# Patient Record
Sex: Male | Born: 1960 | Race: Black or African American | Hispanic: No | Smoking: Former smoker
Health system: Southern US, Community
[De-identification: ages and names within clinical notes are randomized; demographics above are authoritative.]

## PROBLEM LIST (undated history)

## (undated) DIAGNOSIS — I509 Heart failure, unspecified: Secondary | ICD-10-CM

## (undated) DIAGNOSIS — J449 Chronic obstructive pulmonary disease, unspecified: Secondary | ICD-10-CM

## (undated) DIAGNOSIS — R569 Unspecified convulsions: Secondary | ICD-10-CM

## (undated) DIAGNOSIS — I10 Essential (primary) hypertension: Secondary | ICD-10-CM

## (undated) HISTORY — DX: Heart failure, unspecified: I50.9

---

## 2006-06-10 ENCOUNTER — Emergency Department: Payer: Self-pay | Admitting: Emergency Medicine

## 2009-04-22 ENCOUNTER — Emergency Department: Payer: Self-pay | Admitting: Emergency Medicine

## 2009-11-23 ENCOUNTER — Inpatient Hospital Stay: Payer: Self-pay | Admitting: Internal Medicine

## 2010-07-04 ENCOUNTER — Inpatient Hospital Stay: Payer: Self-pay | Admitting: Internal Medicine

## 2010-08-11 ENCOUNTER — Inpatient Hospital Stay: Payer: Self-pay | Admitting: Specialist

## 2010-12-09 ENCOUNTER — Inpatient Hospital Stay: Payer: Self-pay | Admitting: Student

## 2011-04-28 ENCOUNTER — Observation Stay: Payer: Self-pay | Admitting: Internal Medicine

## 2011-08-04 ENCOUNTER — Inpatient Hospital Stay: Payer: Self-pay | Admitting: Internal Medicine

## 2011-08-04 LAB — CK TOTAL AND CKMB (NOT AT ARMC): CK-MB: 0.7 ng/mL (ref 0.5–3.6)

## 2011-08-04 LAB — CBC
HGB: 12.1 g/dL — ABNORMAL LOW (ref 13.0–18.0)
MCH: 32.2 pg (ref 26.0–34.0)
MCHC: 33.9 g/dL (ref 32.0–36.0)
RBC: 3.75 10*6/uL — ABNORMAL LOW (ref 4.40–5.90)
RDW: 12.1 % (ref 11.5–14.5)
WBC: 4 10*3/uL (ref 3.8–10.6)

## 2011-08-04 LAB — COMPREHENSIVE METABOLIC PANEL
Albumin: 3.9 g/dL (ref 3.4–5.0)
Alkaline Phosphatase: 36 U/L — ABNORMAL LOW (ref 50–136)
Anion Gap: 11 (ref 7–16)
BUN: 41 mg/dL — ABNORMAL HIGH (ref 7–18)
Bilirubin,Total: 0.4 mg/dL (ref 0.2–1.0)
Co2: 21 mmol/L (ref 21–32)
Creatinine: 2.58 mg/dL — ABNORMAL HIGH (ref 0.60–1.30)
EGFR (Non-African Amer.): 28 — ABNORMAL LOW
Glucose: 105 mg/dL — ABNORMAL HIGH (ref 65–99)
Osmolality: 279 (ref 275–301)
Potassium: 4.2 mmol/L (ref 3.5–5.1)
SGOT(AST): 97 U/L — ABNORMAL HIGH (ref 15–37)
SGPT (ALT): 45 U/L
Sodium: 134 mmol/L — ABNORMAL LOW (ref 136–145)
Total Protein: 8.4 g/dL — ABNORMAL HIGH (ref 6.4–8.2)

## 2011-08-04 LAB — DRUG SCREEN, URINE
Amphetamines, Ur Screen: NEGATIVE (ref ?–1000)
Barbiturates, Ur Screen: NEGATIVE (ref ?–200)
Benzodiazepine, Ur Scrn: NEGATIVE (ref ?–200)
Cannabinoid 50 Ng, Ur ~~LOC~~: NEGATIVE (ref ?–50)
Cocaine Metabolite,Ur ~~LOC~~: NEGATIVE (ref ?–300)
Methadone, Ur Screen: NEGATIVE (ref ?–300)
Opiate, Ur Screen: NEGATIVE (ref ?–300)
Phencyclidine (PCP) Ur S: NEGATIVE (ref ?–25)
Tricyclic, Ur Screen: NEGATIVE (ref ?–1000)

## 2011-08-04 LAB — PROTIME-INR
INR: 0.9
Prothrombin Time: 12 secs (ref 11.5–14.7)

## 2011-08-04 LAB — TROPONIN I: Troponin-I: 0.27 ng/mL — ABNORMAL HIGH

## 2011-08-04 LAB — PRO B NATRIURETIC PEPTIDE: B-Type Natriuretic Peptide: 305 pg/mL — ABNORMAL HIGH (ref 0–125)

## 2011-08-05 LAB — CBC WITH DIFFERENTIAL/PLATELET
Basophil #: 0 10*3/uL (ref 0.0–0.1)
Basophil %: 0.1 %
HGB: 11.4 g/dL — ABNORMAL LOW (ref 13.0–18.0)
Lymphocyte #: 0.7 10*3/uL — ABNORMAL LOW (ref 1.0–3.6)
Lymphocyte %: 31.3 %
MCH: 32.1 pg (ref 26.0–34.0)
MCHC: 33.7 g/dL (ref 32.0–36.0)
Monocyte #: 0.1 10*3/uL (ref 0.0–0.7)
Monocyte %: 4.6 %
Neutrophil %: 63.8 %
RDW: 11.6 % (ref 11.5–14.5)

## 2011-08-05 LAB — LIPID PANEL: Ldl Cholesterol, Calc: 54 mg/dL (ref 0–100)

## 2011-08-05 LAB — BASIC METABOLIC PANEL
Anion Gap: 11 (ref 7–16)
BUN: 40 mg/dL — ABNORMAL HIGH (ref 7–18)
Calcium, Total: 8.1 mg/dL — ABNORMAL LOW (ref 8.5–10.1)
Co2: 20 mmol/L — ABNORMAL LOW (ref 21–32)
Creatinine: 2.07 mg/dL — ABNORMAL HIGH (ref 0.60–1.30)
EGFR (African American): 44 — ABNORMAL LOW
EGFR (Non-African Amer.): 36 — ABNORMAL LOW
Sodium: 137 mmol/L (ref 136–145)

## 2011-08-05 LAB — TROPONIN I: Troponin-I: 0.26 ng/mL — ABNORMAL HIGH

## 2011-08-06 LAB — TROPONIN I: Troponin-I: 0.19 ng/mL — ABNORMAL HIGH

## 2011-08-06 LAB — BASIC METABOLIC PANEL
BUN: 44 mg/dL — ABNORMAL HIGH (ref 7–18)
Calcium, Total: 8.5 mg/dL (ref 8.5–10.1)
Chloride: 109 mmol/L — ABNORMAL HIGH (ref 98–107)
EGFR (African American): 46 — ABNORMAL LOW
Glucose: 113 mg/dL — ABNORMAL HIGH (ref 65–99)
Osmolality: 288 (ref 275–301)
Potassium: 4.5 mmol/L (ref 3.5–5.1)

## 2011-08-06 LAB — CK: CK, Total: 899 U/L — ABNORMAL HIGH (ref 35–232)

## 2011-08-07 LAB — BASIC METABOLIC PANEL
Anion Gap: 9 (ref 7–16)
Co2: 22 mmol/L (ref 21–32)
Creatinine: 1.53 mg/dL — ABNORMAL HIGH (ref 0.60–1.30)
EGFR (Non-African Amer.): 51 — ABNORMAL LOW
Glucose: 121 mg/dL — ABNORMAL HIGH (ref 65–99)
Sodium: 145 mmol/L (ref 136–145)

## 2012-10-24 DIAGNOSIS — N183 Chronic kidney disease, stage 3 unspecified: Secondary | ICD-10-CM | POA: Insufficient documentation

## 2013-11-01 ENCOUNTER — Inpatient Hospital Stay: Payer: Self-pay | Admitting: Internal Medicine

## 2013-11-01 LAB — CBC WITH DIFFERENTIAL/PLATELET
Basophil #: 0 10*3/uL (ref 0.0–0.1)
Basophil %: 0.9 %
Eosinophil #: 0 10*3/uL (ref 0.0–0.7)
Eosinophil %: 0 %
HCT: 36 % — ABNORMAL LOW (ref 40.0–52.0)
HGB: 11.9 g/dL — ABNORMAL LOW (ref 13.0–18.0)
Lymphocyte #: 0.7 10*3/uL — ABNORMAL LOW (ref 1.0–3.6)
Lymphocyte %: 22 %
MCH: 31.7 pg (ref 26.0–34.0)
MCHC: 33.2 g/dL (ref 32.0–36.0)
MCV: 95 fL (ref 80–100)
MONO ABS: 0.2 x10 3/mm (ref 0.2–1.0)
MONOS PCT: 6.1 %
NEUTROS PCT: 71 %
Neutrophil #: 2.1 10*3/uL (ref 1.4–6.5)
PLATELETS: 65 10*3/uL — AB (ref 150–440)
RBC: 3.78 10*6/uL — AB (ref 4.40–5.90)
RDW: 12.6 % (ref 11.5–14.5)
WBC: 3 10*3/uL — ABNORMAL LOW (ref 3.8–10.6)

## 2013-11-01 LAB — BASIC METABOLIC PANEL
Anion Gap: 10 (ref 7–16)
BUN: 37 mg/dL — ABNORMAL HIGH (ref 7–18)
CALCIUM: 8.3 mg/dL — AB (ref 8.5–10.1)
CO2: 20 mmol/L — AB (ref 21–32)
Chloride: 99 mmol/L (ref 98–107)
Creatinine: 2.59 mg/dL — ABNORMAL HIGH (ref 0.60–1.30)
EGFR (African American): 32 — ABNORMAL LOW
EGFR (Non-African Amer.): 27 — ABNORMAL LOW
GLUCOSE: 110 mg/dL — AB (ref 65–99)
OSMOLALITY: 268 (ref 275–301)
Potassium: 3.7 mmol/L (ref 3.5–5.1)
Sodium: 129 mmol/L — ABNORMAL LOW (ref 136–145)

## 2013-11-01 LAB — TROPONIN I: Troponin-I: 0.29 ng/mL — ABNORMAL HIGH

## 2013-11-02 LAB — URINALYSIS, COMPLETE
Bilirubin,UR: NEGATIVE
Glucose,UR: NEGATIVE mg/dL (ref 0–75)
Ketone: NEGATIVE
LEUKOCYTE ESTERASE: NEGATIVE
Nitrite: NEGATIVE
Ph: 5 (ref 4.5–8.0)
Protein: 75
RBC,UR: 3 /HPF (ref 0–5)
SPECIFIC GRAVITY: 1.005 (ref 1.003–1.030)
Squamous Epithelial: 1

## 2013-11-02 LAB — CBC WITH DIFFERENTIAL/PLATELET
Basophil #: 0 10*3/uL (ref 0.0–0.1)
Basophil %: 0.8 %
Eosinophil #: 0 10*3/uL (ref 0.0–0.7)
Eosinophil %: 0.1 %
HCT: 32.4 % — AB (ref 40.0–52.0)
HGB: 10.8 g/dL — AB (ref 13.0–18.0)
LYMPHS ABS: 0.6 10*3/uL — AB (ref 1.0–3.6)
LYMPHS PCT: 23.1 %
MCH: 31.6 pg (ref 26.0–34.0)
MCHC: 33.4 g/dL (ref 32.0–36.0)
MCV: 95 fL (ref 80–100)
MONOS PCT: 4.9 %
Monocyte #: 0.1 x10 3/mm — ABNORMAL LOW (ref 0.2–1.0)
NEUTROS ABS: 1.9 10*3/uL (ref 1.4–6.5)
Neutrophil %: 71.1 %
PLATELETS: 62 10*3/uL — AB (ref 150–440)
RBC: 3.42 10*6/uL — ABNORMAL LOW (ref 4.40–5.90)
RDW: 12.9 % (ref 11.5–14.5)
WBC: 2.7 10*3/uL — ABNORMAL LOW (ref 3.8–10.6)

## 2013-11-02 LAB — BASIC METABOLIC PANEL
Anion Gap: 12 (ref 7–16)
BUN: 39 mg/dL — ABNORMAL HIGH (ref 7–18)
CHLORIDE: 100 mmol/L (ref 98–107)
Calcium, Total: 7.7 mg/dL — ABNORMAL LOW (ref 8.5–10.1)
Co2: 18 mmol/L — ABNORMAL LOW (ref 21–32)
Creatinine: 2.64 mg/dL — ABNORMAL HIGH (ref 0.60–1.30)
GFR CALC AF AMER: 31 — AB
GFR CALC NON AF AMER: 27 — AB
GLUCOSE: 102 mg/dL — AB (ref 65–99)
Osmolality: 270 (ref 275–301)
Potassium: 3.9 mmol/L (ref 3.5–5.1)
SODIUM: 130 mmol/L — AB (ref 136–145)

## 2013-11-02 LAB — MAGNESIUM: Magnesium: 0.9 mg/dL — ABNORMAL LOW

## 2013-11-02 LAB — TROPONIN I
TROPONIN-I: 0.24 ng/mL — AB
Troponin-I: 0.26 ng/mL — ABNORMAL HIGH

## 2014-08-31 NOTE — H&P (Signed)
PATIENT NAME:  Darin Hopkins, Darin Hopkins MR#:  [id number] DATE OF BIRTH:  11-10-1960  DATE OF ADMISSION:  11/01/2013  REFERRING PHYSICIAN:  Dr. Minna Antis  PRIMARY CARE PHYSICIAN: [facility].   CHIEF COMPLAINT: Generalized weakness.   HISTORY OF PRESENT ILLNESS: This is a 54 year old male with a known history of CVA in the past, hypertension, hyperlipidemia and seizures, and history of alcohol abuse in the past, presents with complaints of generalized weakness, cough, chills and shortness of breath. The patient was hypoxic at 90% on room air. The patient denies any focal deficits and altered mental status or loss of consciousness, but her symptoms have been going on almost for a week right now. Patient's chest x-ray did not show any acute findings, but he did   report cough with productive sputum. He is known to have history of COPD, had significant wheezing upon presentation as well, which improved after receiving IV Solu-Medrol. The patient had multiple lab abnormalities, including acute renal failure and hyponatremia. Reports his p.o. intake has been decreased recently as he does not have much of an appetite.  He denies any focal deficits. No tingling. No numbness.  His CT head did not show any acute findings or show any evidence of  his old CVA. The patient was noticed to have thrombocytopenia at 65,000. The patient is known to have history of pancytopenia in the past which was due to bone marrow suppression from alcohol abuse, but baseline platelet was around 100,000, but at the same time, he has not been here for the last 2 years so we do not have any recent labs on him or recent platelet level.   Hospitalist service requested to admit the patient to hydrate and treat his COPD.   PAST MEDICAL HISTORY:  1. History of alcohol abuse.  2. History of tobacco abuse.  3. Coronary artery disease.  4. Anemia of chronic disease.  5. COPD. 6. Hyperlipidemia.  7. Hypertension.  8. History of CVA.   9. History of seizures.  10. 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 History of rhabdomyolysis and acute renal failure in the past.   PAST SURGICAL HISTORY: None.   ALLERGIES: No known drug allergies.   SOCIAL HISTORY: Lives at home. Smokes a pack per day. He is on disability. The patient reports he got back on his alcoholic drink once or twice a week.   FAMILY HISTORY: Significant for coronary artery disease.   HOME MEDICATIONS:  1. Norvasc 5 mg daily.  2. Aspirin 81 mg daily.  3. Atorvastatin 20 mg at bedtime.  4. Folic acid 1 mg daily.  5. Keppra 500 mg b.i.d.  6. Combivent as needed.  7. Symbicort 160/4.5 two puffs b.i.d.   REVIEW OF SYSTEMS:  CONSTITUTIONAL: Patient reports chills, fatigue, weakness. Denies weight gain, weight loss. Reports poor appetite.  EYES: Denies blurry vision, double vision, inflammation.  EARS, NOSE, AND THROAT: Denies  , hearing loss, epistaxis.  RESPIRATORY: Reports cough, productive sputum, wheezing, shortness of breath, and COPD.  CARDIOVASCULAR: Denies chest pain, edema, arrhythmia, palpitations.  GASTROINTESTINAL: Denies nausea, vomiting, diarrhea, abdominal pain.  GENITOURINARY: Denies dysuria, hematuria, or renal colic.  ENDOCRINE: Denies polyuria, polydipsia, heat or cold intolerance.  HEMATOLOGY: Denies anemia, easy bruising, bleeding diathesis.  INTEGUMENT: Denies acne, rash or skin lesion.   MUSCULOSKELETAL: Denies any swelling, gout, arthritis, cramps.  NEUROLOGIC: Reports history of CVA with residual left lower extremity weakness. Denies any focal deficits, weakness or numbness or dysarthria.  PSYCHIATRIC: Denies anxiety,  insomnia, or depression. Reports he got back on his alcohol abuse. He drinks 1-2 times a week.   PHYSICAL EXAMINATION:  VITAL SIGNS: Temperature 98.3, pulse 98, respiratory rate 19, blood pressure 106/76, saturating 94% on room air.  GENERAL: Frail, elderly frail male, appears much  older than his stated age. He is in bed, in no apparent distress.  HEENT: Head atraumatic, normocephalic. Pupils equal, reactive to light. Pink conjunctivae. Anicteric sclerae. Moist oral mucosa.  NECK: Supple. No thyromegaly. No JVD.  CHEST: Good air entry bilaterally. Has diffuse scattered wheezing bilaterally. No rales or rhonchi.  CARDIOVASCULAR: S1, S2 heard. No rubs, murmurs or gallops.  ABDOMEN: Soft, nontender, nondistended. Bowel sounds present.  EXTREMITIES: No edema. No clubbing. No cyanosis. Pedal and radial pulses felt bilaterally.  PSYCHIATRIC: Appropriate affect. Awake, alert x 3. Intact judgment and insight.  NEUROLOGIC: Cranial nerves grossly intact. Motor 5/5. No focal deficits.  MUSCULOSKELETAL: No joint effusion or erythema.  SKIN: Warm and dry. Delayed skin turgor.   PERTINENT LABORATORY DATA: Glucose 110, BUN 37, creatinine 2.59, sodium 129, potassium 3.7, chloride 99, CO2 of 20, troponin 0.29. White blood cells 3, hemoglobin 11.9, hematocrit 36, platelets 65,000.   IMAGING STUDIES: CT head without contrast, chronic ischemic changes. No acute abnormalities. Chest x-ray showing no active disease. EKG showing sinus rhythm at 97 beats per minute without significant ST changes once compared to most recent EKG in 2013.   ASSESSMENT AND PLAN:  1. Generalized weakness. This appears to be multifactorial, most likely due to chronic obstructive pulmonary exacerbation and dehydration and hyponatremia and acute renal failure. As well, we will check urinalysis on the patient.  2. Chronic obstructive pulmonary exacerbation. The patient will be started on IV Solu-Medrol, DuoNebs, and p.r.n. albuterol. We will continue him on Symbicort. We will keep him on p.r.n. oxygen as well. Given the fact he is having productive sputum, we will start him on IV levofloxacin as well.  3. Acute renal failure. Cause is most likely due to volume depletion and dehydration. He has no recent blood work so  unclear what is his baseline. We will continue with intravenous fluids.  4. Hyponatremia. This is due to volume depletion. We will continue with normal saline.  5. Thrombocytopenia. Unclear what is his most recent baseline but he is known to have history of pancytopenia, secondary to his bone marrow depression from alcohol abuse. We will avoid chemical anticoagulation. We will check right upper quadrant ultrasound to evaluate for splenomegaly participating in worsening of his thrombocytopenia.  6. History of cerebrovascular accident. The patient has no acute deficits. We will continue with aspirin.  7. Elevated troponin. Denies any chest pain, has no EKG changes. We will continue to cycle his troponins. This is most likely related to his chronic kidney disease. As well, patient has a chronically elevated troponin and this seems to be around his baseline.  8. Tobacco abuse. The patient was counseled will be started on NicoDerm patch.  9. History of seizures. Continue with  Keppra.  10. History of alcohol abuse. The patient reports he cut back significantly on his alcohol, but we will start him on CIWA protocol as well.  11. Deep vein thrombosis prophylaxis. Sequential compression device.   CODE STATUS: Full code.   TOTAL TIME SPENT ON ADMISSION AND PATIENT CARE: 55 minutes.    ____________________________ Starleen Armsawood S. Elgergawy, MD dse:dd D: 11/02/2013 01:12:18 ET T: 11/02/2013 04:14:33 ET JOB#: [id number]  cc: Starleen Armsawood S. Elgergawy, MD, <Dictator> DAWOOD Teena IraniS ELGERGAWY MD ELECTRONICALLY  SIGNED 11/02/2013 20:35

## 2014-08-31 NOTE — Discharge Summary (Signed)
PATIENT NAME:  Darin Hopkins, Darin Hopkins DATE OF BIRTH:  [birth year]  DATE OF ADMISSION:  11/01/2013 DATE OF DISCHARGE:  11/04/2013  DISCHARGE DIAGNOSES: 1.  Chronic obstructive pulmonary disease exacerbation.  2.  Hypertension. 3.  EtOH abuse.  4.  Tobacco abuse.  5.  Chronic thrombocytopenia.  DISCHARGE MEDICATIONS:   1.  Amlodipine 5 mg p.o. daily.  2.  Aspirin 81 mg p.o. daily. 3.  Combivent 2 puffs as needed for wheezing every 4 hours.  4.  Folic acid 1 mg p.o. daily.  5.  Atorvastatin 20 mg p.o. daily.  6.  Keppra 500 mg p.o. b.i.d. 7.  Symbicort 150/4.5 two puffs b.i.d.  8.  Prednisone 20 mg 3 tablets daily for 2 days, 2 tablets daily for 2 days, then 1 tablet daily for 2 days.  9.  Levaquin 500 mg p.o. daily. The patient was given Levaquin for 5 days.   CONSULTATIONS: None.   HOSPITAL COURSE: The patient is a 54 year old  male patient with a history of CVA in the pons, hypertension, seizures, and alcohol abuse.  Comes in because of cough, chills, and trouble breathing. The patient was found to have COPD exacerbation, admitted to the hospitalist service and start on nebulizers, steroids, and antibiotics. The patient's symptoms nicely improved and the patient was discharged home with Levaquin and Keppra and course of prednisone.   Generalized weakness secondary to COPD exacerbation, dehydration, and hypernatremia. The patient is on IV fluids and seen with physical therapy, and they recommended no health needs at home. Discharged home with sister.   Acute renal failure secondary to volume depletion. The patient's creatinine 2.5 and BUN 37 on admission. The patient thought to be dehydrated, and he did receive IV fluids and initial  creatinine was 2.5 and BUN 37. After the fluids resuscitation, the BUN and creatinine decreased. BUN was 39 and creatinine 2.6 repeated.   Hypomagnesemia secondary to EtOH abuse, which is being replaced. The patient had a history of his alcohol  abuse and ultrasound showed no focal lesions, and the patient's ultrasound of abdomen showed no splenomegaly. The patient does have anemia and thrombocytopenia. Platelets are low at 62,000 and hemoglobin was 10.8. The patient thought to have troponin elevation likely secondary to renal failure, rather than acute coronary event because he did not have any chest pain. The patient was consulted on smoking and drinking. He said he will stop it.   History of seizures and history of CVA. He is on Keppra. We will continue that.   TIME SPENT ON DISCHARGE OF THE PATIENT:  More than 30 minutes.   ____________________________ Katha HammingSnehalatha Candence Sease, MD sk:ts D: 11/08/2013 10:20:01 ET T: 11/08/2013 14:22:54 ET JOB#: [id number]  cc: Katha HammingSnehalatha Jayliana Valencia, MD, <Dictator> Katha HammingSNEHALATHA Deandrew Hoecker MD ELECTRONICALLY SIGNED 11/22/2013 18:42

## 2014-09-01 NOTE — H&P (Signed)
PATIENT NAME:  Darin Hopkins, Darin Hopkins MR#:  [id number] DATE OF BIRTH:  1960-09-16  DATE OF ADMISSION:  08/04/2011  PRIMARY CARE PHYSICIAN: Dr. Em[facility]ylvia [facility] at [facility]    CHIEF COMPLAINT: Having a cold and shortness of breath.   HISTORY OF PRESENT ILLNESS: 54 year old man who presents to the Emergency Room with three days of having a cold. He has been having shortness of breath, coughing up yellow phlegm. He complains of a fever, chills and sweats and fatigue. No complaints of chest pain. Some slight wheeze. The patient has a history of chronic obstructive pulmonary disease. In the Emergency Room he was given 125 mg of Solu-Medrol. He did have a chest x-ray that showed no pneumonia and hyperinflation. He was found to be in acute renal failure with a creatinine of 2.58. Troponin was borderline at 0.27. He was found to be hypoxic with a pO2 on ABG of 52. Hospitalist services were contacted for further evaluation.   PAST MEDICAL HISTORY:  1. Alcohol dependence.  2. Tobacco dependence.  3. Coronary artery disease. 4. Anemia of chronic disease. 5. Chronic obstructive pulmonary disease. 6. Hyperlipidemia. 7. Hypertension. 8. History of stroke. 9. Seizure.   PAST SURGICAL HISTORY: None.   ALLERGIES: No known drug allergies.   SOCIAL HISTORY: Lives with his sister. He smokes 1/2 pack per day. He states that he drinks less than a pint a day that he admitted to the last time in the hospital. Not working at this time.   FAMILY HISTORY: Mother died at 3481 of natural causes. Father died at 1161 of a myocardial infarction.   MEDICATIONS: As per pharmacy tech include:  1. Amlodipine 5 mg daily.  2. Aspirin 81 mg daily.  3. Atorvastatin 20 mg at bedtime.  4. Combivent 2 puffs as needed for shortness of breath.   5. Folic acid 1 mg daily.  6. Hydralazine 75 mg 4 times a day.  7. Hydrochlorothiazide lisinopril 25/20, 1 tablet daily.  8. Omeprazole 20 mg daily.  9. Patient also states that he  takes Keppra 500 mg twice a day.   REVIEW OF SYSTEMS: CONSTITUTIONAL: Positive for fever. Positive for chills. Positive for sweats. Positive for weight loss. Positive for fatigue. EYES: He does not wear glasses. EARS, NOSE, MOUTH, AND THROAT: Positive for runny nose. No sore throat. No difficulty swallowing. CARDIOVASCULAR: No chest pain. No palpitations. RESPIRATORY: Positive for shortness breath. Positive for cough, yellow phlegm. Positive for wheeze. No hemoptysis. GASTROINTESTINAL: Positive for diarrhea. No nausea. No vomiting. No abdominal pain. No bright red blood per rectum. No melena. GENITOURINARY: No burning on urination. No hematuria. MUSCULOSKELETAL: No joint pain or muscle pain. INTEGUMENT: No rashes or eruptions. NEUROLOGIC: No fainting or blackouts. PSYCHIATRIC: No anxiety or depression. ENDOCRINE: No thyroid problems. HEMATOLOGIC/LYMPHATIC: History of anemia.   PHYSICAL EXAMINATION:  VITAL SIGNS: Vital signs on presentation: Temperature 99.7, pulse 99, respirations 18, blood pressure 119/74, pulse oximetry 86% on room air.   GENERAL: No respiratory distress at this point. Patient lying flat in bed.   EYES: Conjunctivae bloodshot. Lids normal. Pupils equal, round, and reactive to light. Extraocular muscles intact. No nystagmus.   EARS, NOSE, MOUTH, AND THROAT: Tympanic membranes no erythema. Nasal mucosa no erythema. Throat no erythema. No exudate seen. Lips and gums no lesions.   NECK: No JVD. No bruits. No lymphadenopathy. No thyromegaly. No thyroid nodules palpated.   RESPIRATORY: Decreased breath sounds bilaterally. Positive wheeze throughout entire lung field.   CARDIOVASCULAR: S1, S2 normal. No gallops,  rubs, or murmurs heard. Carotid upstroke 2+ bilaterally. No bruits. Dorsalis pedis pulses 1+ bilaterally. No edema of the lower extremity.   ABDOMEN: Soft, nontender. No organomegaly/splenomegaly. Normoactive bowel sounds. No masses felt.   LYMPHATIC: No lymph nodes in the  neck.   MUSCULOSKELETAL: Positive for clubbing. No cyanosis on oxygen. No edema of the lower extremities.   SKIN: No ulcers seen.   NEUROLOGIC: Cranial nerves II through XII grossly intact. Deep tendon reflexes 2+ bilateral lower extremities.   PSYCHIATRIC: Patient is oriented to person, place, and time.   LABORATORY, DIAGNOSTIC, AND RADIOLOGICAL DATA: EKG is a normal sinus rhythm, 92 beats per minute, left atrial enlargement, septal infarct age undetermined. Chest x-ray shows hyperinflation, no infiltrate. BNP 305. CPK 1087, CK-MB 0.7. Glucose 105, BUN 41, creatinine 2.58, sodium 134, potassium 4.2, chloride 102, CO2 21, calcium 8.5. Liver function tests: Alkaline phosphatase 36, ALT 45, AST 97, total protein 8.4. White blood cell count 4.0, hemoglobin and hematocrit 12.1 and 35.6, platelet count 111. Troponin borderline at 0.27. INR 0.9. ABG shows pH 7.32, pCO2 41, pO2 52, that is on room air, bicarbonate 21.1, oxygen saturation 90.7.   ASSESSMENT AND PLAN:  1. Respiratory failure. pO2 in the 50s. Will give oxygen supplementation.  2. Chronic obstructive pulmonary disease exacerbation. Will give IV Solu-Medrol, 125 mg IV given already. Will give 60 mg every eight hours. Will also give DuoNeb nebulizer solution and Levaquin IV.  3. Elevated troponin with a history of coronary artery disease. Will continue aspirin. Will obtain an echocardiogram. I think this is most likely secondary to respiratory failure. No beta blocker with his history of wheezing and chronic obstructive pulmonary disease.  4. Acute renal failure on chronic kidney disease. Will hold lisinopril/HCT at this point. Give IV fluid hydration. Get a renal sonogram. Will check creatinine on a daily basis.  5. Rhabdomyolysis. Will stop Lipitor. Give IV fluid hydration. Check a urine toxicology.  6. Tobacco abuse. Smoking cessation counseling done, three minutes by me. Nicotine patch applied.  7. Alcohol abuse. Will put on oral CIWA  protocol. No signs of tremor at this time.  8. History of seizure. Continue Keppra.  9. Gastroesophageal reflux disease. Continue omeprazole.  10. Hypertension. Continue hydralazine and Norvasc at this point. Hold lisinopril/HCT secondary to acute renal failure.  11. Increased liver function tests most likely secondary to alcohol. While getting the renal sonogram will also sonogram the liver.  12. Thrombocytopenia. Looking back at old labs the patient's platelet count has fluctuated in the past. Will continue to monitor. Most likely this is secondary to alcohol also.  TIME SPENT ON ADMISSION: 50 minutes.   ____________________________ Herschell Dimes. Renae Gloss, MD rjw:cms D: 08/04/2011 16:18:58 ET T: 08/04/2011 16:41:35 ET JOB#: [id number]  cc: Herschell Dimes. Renae Gloss, MD, <Dictator> [facility], Dr. Emilio Math Salley Scarlet MD ELECTRONICALLY SIGNED 08/04/2011 21:01

## 2014-09-01 NOTE — Discharge Summary (Signed)
PATIENT NAME:  Darin Hopkins, Darin Hopkins MR#:  [id number] DATE OF BIRTH:  April 07, 1961  DATE OF ADMISSION:  08/04/2011 DATE OF DISCHARGE:  08/07/2011  ADMITTING PHYSICIAN: Alford Highlandichard Wieting, MD    DISCHARGING PHYSICIAN: Enid Baasadhika Aariya Ferrick, MD    PR[facility] C[facility] PHYSICIAN: [facility].   CONSULTATIONS IN THE HOSPITAL: None.   DISCHARGE DIAGNOSES:  1. Acute respiratory failure secondary to chronic obstructive pulmonary disease exacerbation.  2. Acute renal failure.  3. Rhabdomyolysis.  4. Tobacco abuse disorder.  5. Seizure disorder.  6. Alcohol abuse.  7. 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 8. Hypertension.  9. Coronary artery disease.  10. History of cerebrovascular accident.    DISCHARGE MEDICATIONS:  1. Amlodipine 5 mg p.o. daily.  2. Aspirin 81 mg p.o. daily.  3. Combivent inhaler 2 puffs every six hours p.r.n.  4. Folic acid 1 mg p.o. daily.  5. Prilosec 20 mg p.o. daily.  6. Atorvastatin 20 mg daily.  7. Symbicort 160/4.5, 2 puffs b.i.d.  8. Keppra 500 mg p.o. b.i.d.  9. Prednisone taper.  10. Levaquin 500 mg p.o. daily until 08/11/2011.   DISCHARGE DIET: Low-sodium diet.   DISCHARGE ACTIVITY: As tolerated.    FOLLOWUP INSTRUCTIONS: Primary care physician follow-up in 1 to 2 weeks.   HOME OXYGEN: None.   LABORATORY, DIAGNOSTIC AND RADIOLOGICAL DATA:  Sodium 145, potassium 4.8, chloride 114, bicarbonate 22, BUN 34, creatinine 1.53, glucose 121, calcium 8.2.  CK 575 at the time of discharge.  WBC 2.4, hemoglobin 11.4, hematocrit 32.9, platelet count 106.  Echo Doppler showing normal size left ventricle, no thrombus, ejection fraction greater than 55%.  Ultrasound of the abdomen showing fatty infiltration of liver. No focal hepatic mass and no gallstones seen. The gallbladder wall is slightly thickened. No ascites is found.   LDL cholesterol 54, HDL 44, total cholesterol 007118, triglycerides 99, TSH 0.39.  Troponin elevated at 0.29 on admission.  BNP  was 305. ALT 45, AST 97, alkaline phosphatase 36, total bilirubin 0.4, albumin 3.9.  Chest x-ray showing clear lung fields, hyperinflated lungs suggesting chronic obstructive pulmonary disease or asthma.  Creatinine on admission was 2.58. CK on admission was 1087.   BRIEF HOSPITAL COURSE: [name] is a 54 year old African American male with past medical history significant for alcohol use, smoking, hypertension, chronic obstructive pulmonary disease, presented with cold and worsening dyspnea. He was found to be in acute renal failure with slightly elevated troponin and also hypoxia.   1. Acute hypoxic respiratory failure with pO2 on arterial blood gas of 52. He is being admitted, placed on oxygen support via nasal cannula. Steroids, inhalers, and also DuoNebs were started. He is also on Levaquin for bronchitis and cough symptoms. Clinically, his condition has improved. He is currently weaned off and on room air, saturating well, ambulating without any dyspnea. I will finish off the prednisone taper and also Levaquin course. He can continue taking his Symbicort and Combivent inhalers for now.  2. Acute renal failure with rhabdomyolysis: Probably from alcohol use, fall?, though the patient denies it.  With IV fluids, the numbers have improved, and  renal function is much improving, close to baseline currently. Also, CPK has come down to 500s at the time of discharge. He will need PCP follow-up in the next 1 to 2 weeks.  3. Hypertension: Continue Norvasc.  4. Seizure disorder: Has been on Keppra. Continue Keppra. I am not sure if this is a primary seizure disorder or alcohol withdrawal seizure in the past.  5.  Alcohol use and tobacco use disorder: He has been strongly counseled and said that he would stay away from both of those. He has been on CIWA  protocol while in the hospital and has not required much Ativan. His course has been otherwise uneventful in the hospital.   DISCHARGE CONDITION: Stable.    DISCHARGE DISPOSITION: Home.   TIME SPENT ON DISCHARGE: 40 minutes.   ____________________________ Enid Baas, MD rk:cbb D: 08/07/2011 12:50:19 ET T: 08/09/2011 11:15:14 ET JOB#: [id number]  cc: Enid Baas, MD, <Dictator> Enid Baas MD ELECTRONICALLY SIGNED 08/13/2011 13:47

## 2017-09-14 ENCOUNTER — Encounter: Payer: Self-pay | Admitting: *Deleted

## 2020-08-17 ENCOUNTER — Encounter: Payer: Self-pay | Admitting: Emergency Medicine

## 2020-08-17 ENCOUNTER — Inpatient Hospital Stay
Admission: EM | Admit: 2020-08-17 | Discharge: 2020-08-25 | DRG: 291 | Disposition: A | Discharge status: Home Health | Payer: Medicaid Other | Attending: Internal Medicine | Admitting: Internal Medicine

## 2020-08-17 ENCOUNTER — Emergency Department: Payer: Medicaid Other

## 2020-08-17 ENCOUNTER — Other Ambulatory Visit: Payer: Self-pay

## 2020-08-17 DIAGNOSIS — R54 Age-related physical debility: Secondary | ICD-10-CM | POA: Diagnosis present

## 2020-08-17 DIAGNOSIS — I5033 Acute on chronic diastolic (congestive) heart failure: Secondary | ICD-10-CM | POA: Diagnosis present

## 2020-08-17 DIAGNOSIS — I2510 Atherosclerotic heart disease of native coronary artery without angina pectoris: Secondary | ICD-10-CM | POA: Diagnosis present

## 2020-08-17 DIAGNOSIS — E785 Hyperlipidemia, unspecified: Secondary | ICD-10-CM | POA: Diagnosis present

## 2020-08-17 DIAGNOSIS — Q231 Congenital insufficiency of aortic valve: Secondary | ICD-10-CM

## 2020-08-17 DIAGNOSIS — K219 Gastro-esophageal reflux disease without esophagitis: Secondary | ICD-10-CM

## 2020-08-17 DIAGNOSIS — Z87898 Personal history of other specified conditions: Secondary | ICD-10-CM

## 2020-08-17 DIAGNOSIS — N1831 Chronic kidney disease, stage 3a: Secondary | ICD-10-CM | POA: Diagnosis present

## 2020-08-17 DIAGNOSIS — Z8673 Personal history of transient ischemic attack (TIA), and cerebral infarction without residual deficits: Secondary | ICD-10-CM

## 2020-08-17 DIAGNOSIS — F1010 Alcohol abuse, uncomplicated: Secondary | ICD-10-CM

## 2020-08-17 DIAGNOSIS — Z7982 Long term (current) use of aspirin: Secondary | ICD-10-CM

## 2020-08-17 DIAGNOSIS — F17210 Nicotine dependence, cigarettes, uncomplicated: Secondary | ICD-10-CM | POA: Diagnosis present

## 2020-08-17 DIAGNOSIS — J431 Panlobular emphysema: Secondary | ICD-10-CM

## 2020-08-17 DIAGNOSIS — G40909 Epilepsy, unspecified, not intractable, without status epilepticus: Secondary | ICD-10-CM | POA: Diagnosis present

## 2020-08-17 DIAGNOSIS — Z20822 Contact with and (suspected) exposure to covid-19: Secondary | ICD-10-CM | POA: Diagnosis present

## 2020-08-17 DIAGNOSIS — I509 Heart failure, unspecified: Secondary | ICD-10-CM

## 2020-08-17 DIAGNOSIS — E43 Unspecified severe protein-calorie malnutrition: Secondary | ICD-10-CM | POA: Insufficient documentation

## 2020-08-17 DIAGNOSIS — I130 Hypertensive heart and chronic kidney disease with heart failure and stage 1 through stage 4 chronic kidney disease, or unspecified chronic kidney disease: Principal | ICD-10-CM | POA: Diagnosis present

## 2020-08-17 DIAGNOSIS — I77810 Thoracic aortic ectasia: Secondary | ICD-10-CM | POA: Diagnosis present

## 2020-08-17 DIAGNOSIS — Z6821 Body mass index (BMI) 21.0-21.9, adult: Secondary | ICD-10-CM

## 2020-08-17 DIAGNOSIS — J449 Chronic obstructive pulmonary disease, unspecified: Secondary | ICD-10-CM | POA: Diagnosis present

## 2020-08-17 DIAGNOSIS — I248 Other forms of acute ischemic heart disease: Secondary | ICD-10-CM | POA: Diagnosis present

## 2020-08-17 DIAGNOSIS — E782 Mixed hyperlipidemia: Secondary | ICD-10-CM

## 2020-08-17 DIAGNOSIS — I10 Essential (primary) hypertension: Secondary | ICD-10-CM | POA: Diagnosis not present

## 2020-08-17 DIAGNOSIS — I5023 Acute on chronic systolic (congestive) heart failure: Secondary | ICD-10-CM | POA: Diagnosis not present

## 2020-08-17 DIAGNOSIS — J9601 Acute respiratory failure with hypoxia: Secondary | ICD-10-CM | POA: Diagnosis present

## 2020-08-17 DIAGNOSIS — R64 Cachexia: Secondary | ICD-10-CM | POA: Diagnosis present

## 2020-08-17 DIAGNOSIS — J9602 Acute respiratory failure with hypercapnia: Secondary | ICD-10-CM

## 2020-08-17 HISTORY — DX: Essential (primary) hypertension: I10

## 2020-08-17 HISTORY — DX: Chronic obstructive pulmonary disease, unspecified: J44.9

## 2020-08-17 LAB — COMPREHENSIVE METABOLIC PANEL
ALT: 14 U/L (ref 0–44)
AST: 22 U/L (ref 15–41)
Albumin: 3.6 g/dL (ref 3.5–5.0)
Alkaline Phosphatase: 40 U/L (ref 38–126)
Anion gap: 7 (ref 5–15)
BUN: 41 mg/dL — ABNORMAL HIGH (ref 6–20)
CO2: 27 mmol/L (ref 22–32)
Calcium: 8.4 mg/dL — ABNORMAL LOW (ref 8.9–10.3)
Chloride: 112 mmol/L — ABNORMAL HIGH (ref 98–111)
Creatinine, Ser: 1.54 mg/dL — ABNORMAL HIGH (ref 0.61–1.24)
GFR, Estimated: 52 mL/min — ABNORMAL LOW (ref >60)
Glucose, Bld: 103 mg/dL — ABNORMAL HIGH (ref 70–99)
Potassium: 4.8 mmol/L (ref 3.5–5.1)
Sodium: 146 mmol/L — ABNORMAL HIGH (ref 135–145)
Total Bilirubin: 0.6 mg/dL (ref 0.3–1.2)
Total Protein: 6.9 g/dL (ref 6.5–8.1)

## 2020-08-17 LAB — TSH: TSH: 2.303 u[IU]/mL (ref 0.350–4.500)

## 2020-08-17 LAB — RESP PANEL BY RT-PCR (FLU A&B, COVID) ARPGX2
Influenza A by PCR: NEGATIVE
Influenza B by PCR: NEGATIVE
SARS Coronavirus 2 by RT PCR: NEGATIVE

## 2020-08-17 LAB — CBC WITH DIFFERENTIAL/PLATELET
Abs Immature Granulocytes: 0.02 10*3/uL (ref 0.00–0.07)
Basophils Absolute: 0 10*3/uL (ref 0.0–0.1)
Basophils Relative: 0 %
Eosinophils Absolute: 0.1 10*3/uL (ref 0.0–0.5)
Eosinophils Relative: 1 %
HCT: 44.3 % (ref 39.0–52.0)
Hemoglobin: 13.3 g/dL (ref 13.0–17.0)
Immature Granulocytes: 0 %
Lymphocytes Relative: 51 %
Lymphs Abs: 5.1 10*3/uL — ABNORMAL HIGH (ref 0.7–4.0)
MCH: 31.1 pg (ref 26.0–34.0)
MCHC: 30 g/dL (ref 30.0–36.0)
MCV: 103.5 fL — ABNORMAL HIGH (ref 80.0–100.0)
Monocytes Absolute: 0.5 10*3/uL (ref 0.1–1.0)
Monocytes Relative: 5 %
Neutro Abs: 4.2 10*3/uL (ref 1.7–7.7)
Neutrophils Relative %: 43 %
Platelets: 204 10*3/uL (ref 150–400)
RBC: 4.28 MIL/uL (ref 4.22–5.81)
RDW: 13.2 % (ref 11.5–15.5)
WBC: 9.9 10*3/uL (ref 4.0–10.5)
nRBC: 0 % (ref 0.0–0.2)

## 2020-08-17 LAB — HIV ANTIBODY (ROUTINE TESTING W REFLEX): HIV Screen 4th Generation wRfx: NONREACTIVE

## 2020-08-17 LAB — PHOSPHORUS: Phosphorus: 3.6 mg/dL (ref 2.5–4.6)

## 2020-08-17 LAB — TROPONIN I (HIGH SENSITIVITY)
Troponin I (High Sensitivity): 22 ng/L — ABNORMAL HIGH (ref <18)
Troponin I (High Sensitivity): 23 ng/L — ABNORMAL HIGH (ref <18)

## 2020-08-17 LAB — MAGNESIUM: Magnesium: 1.9 mg/dL (ref 1.7–2.4)

## 2020-08-17 LAB — BRAIN NATRIURETIC PEPTIDE: B Natriuretic Peptide: 1306.1 pg/mL — ABNORMAL HIGH (ref 0.0–100.0)

## 2020-08-17 LAB — MRSA PCR SCREENING: MRSA by PCR: NEGATIVE

## 2020-08-17 MED ORDER — IPRATROPIUM-ALBUTEROL 20-100 MCG/ACT IN AERS
1.0000 | INHALATION_SPRAY | Freq: Four times a day (QID) | RESPIRATORY_TRACT | Status: DC
  Administered 2020-08-17 – 2020-08-25 (×29): 1 via RESPIRATORY_TRACT
  Filled 2020-08-17: qty 4

## 2020-08-17 MED ORDER — LORAZEPAM 1 MG PO TABS
1.0000 mg | ORAL_TABLET | ORAL | Status: AC | PRN
Start: 2020-08-17 — End: 2020-08-20

## 2020-08-17 MED ORDER — THIAMINE HCL 100 MG PO TABS
100.0000 mg | ORAL_TABLET | Freq: Every day | ORAL | Status: DC
  Administered 2020-08-17 – 2020-08-25 (×8): 100 mg via ORAL
  Filled 2020-08-17 (×9): qty 1

## 2020-08-17 MED ORDER — SODIUM CHLORIDE 0.9% FLUSH
3.0000 mL | Freq: Two times a day (BID) | INTRAVENOUS | Status: DC
  Administered 2020-08-17 – 2020-08-25 (×17): 3 mL via INTRAVENOUS

## 2020-08-17 MED ORDER — FUROSEMIDE 10 MG/ML IJ SOLN
40.0000 mg | Freq: Once | INTRAMUSCULAR | Status: AC
  Administered 2020-08-17: 40 mg via INTRAVENOUS
  Filled 2020-08-17: qty 4

## 2020-08-17 MED ORDER — NICOTINE 14 MG/24HR TD PT24: 14.0000 mg | MEDICATED_PATCH | Freq: Every day | TRANSDERMAL | Status: AC | PRN

## 2020-08-17 MED ORDER — CHLORHEXIDINE GLUCONATE CLOTH 2 % EX PADS
6.0000 | MEDICATED_PAD | Freq: Every day | CUTANEOUS | Status: DC
  Administered 2020-08-18 – 2020-08-24 (×6): 6 via TOPICAL
  Filled 2020-08-17: qty 6

## 2020-08-17 MED ORDER — ASPIRIN EC 81 MG PO TBEC
81.0000 mg | DELAYED_RELEASE_TABLET | Freq: Every day | ORAL | Status: DC
  Administered 2020-08-17 – 2020-08-25 (×9): 81 mg via ORAL
  Filled 2020-08-17 (×9): qty 1

## 2020-08-17 MED ORDER — LISINOPRIL 20 MG PO TABS
20.0000 mg | ORAL_TABLET | Freq: Every day | ORAL | Status: DC
  Administered 2020-08-17 – 2020-08-19 (×3): 20 mg via ORAL
  Filled 2020-08-17 (×3): qty 1

## 2020-08-17 MED ORDER — FUROSEMIDE 10 MG/ML IJ SOLN: 40.0000 mg | Freq: Every day | INTRAMUSCULAR | Status: DC

## 2020-08-17 MED ORDER — PANTOPRAZOLE SODIUM 40 MG PO TBEC
40.0000 mg | DELAYED_RELEASE_TABLET | Freq: Every day | ORAL | Status: DC
  Administered 2020-08-17 – 2020-08-25 (×9): 40 mg via ORAL
  Filled 2020-08-17 (×9): qty 1

## 2020-08-17 MED ORDER — MOMETASONE FURO-FORMOTEROL FUM 100-5 MCG/ACT IN AERO
2.0000 | INHALATION_SPRAY | Freq: Two times a day (BID) | RESPIRATORY_TRACT | Status: DC
  Administered 2020-08-17 – 2020-08-25 (×15): 2 via RESPIRATORY_TRACT
  Filled 2020-08-17: qty 8.8

## 2020-08-17 MED ORDER — LORAZEPAM 2 MG/ML IJ SOLN: 1.0000 mg | INTRAMUSCULAR | Status: AC | PRN

## 2020-08-17 MED ORDER — AMLODIPINE BESYLATE 5 MG PO TABS
5.0000 mg | ORAL_TABLET | Freq: Every day | ORAL | Status: DC
  Administered 2020-08-17 – 2020-08-20 (×4): 5 mg via ORAL
  Filled 2020-08-17 (×4): qty 1

## 2020-08-17 MED ORDER — ACETAMINOPHEN 325 MG PO TABS: 650.0000 mg | ORAL_TABLET | ORAL | Status: DC | PRN

## 2020-08-17 MED ORDER — ONDANSETRON HCL 4 MG/2ML IJ SOLN: 4.0000 mg | Freq: Four times a day (QID) | INTRAMUSCULAR | Status: DC | PRN

## 2020-08-17 MED ORDER — SODIUM CHLORIDE 0.9% FLUSH: 3.0000 mL | INTRAVENOUS | Status: DC | PRN

## 2020-08-17 MED ORDER — SODIUM CHLORIDE 0.9 % IV SOLN: 250.0000 mL | INTRAVENOUS | Status: DC | PRN

## 2020-08-17 MED ORDER — ENOXAPARIN SODIUM 40 MG/0.4ML ~~LOC~~ SOLN
40.0000 mg | SUBCUTANEOUS | Status: DC
  Administered 2020-08-17 – 2020-08-24 (×8): 40 mg via SUBCUTANEOUS
  Filled 2020-08-17 (×8): qty 0.4

## 2020-08-17 MED ORDER — FOLIC ACID 1 MG PO TABS
1.0000 mg | ORAL_TABLET | Freq: Every day | ORAL | Status: DC
  Administered 2020-08-17 – 2020-08-25 (×9): 1 mg via ORAL
  Filled 2020-08-17 (×9): qty 1

## 2020-08-17 MED ORDER — ADULT MULTIVITAMIN W/MINERALS CH
1.0000 | ORAL_TABLET | Freq: Every day | ORAL | Status: DC
  Administered 2020-08-17 – 2020-08-25 (×9): 1 via ORAL
  Filled 2020-08-17 (×9): qty 1

## 2020-08-17 MED ORDER — THIAMINE HCL 100 MG/ML IJ SOLN
100.0000 mg | Freq: Every day | INTRAMUSCULAR | Status: DC
  Administered 2020-08-20: 100 mg via INTRAVENOUS
  Filled 2020-08-17: qty 2

## 2020-08-17 MED ORDER — LEVETIRACETAM 500 MG PO TABS
500.0000 mg | ORAL_TABLET | Freq: Two times a day (BID) | ORAL | Status: DC
  Administered 2020-08-17 – 2020-08-25 (×17): 500 mg via ORAL
  Filled 2020-08-17 (×18): qty 1

## 2020-08-17 NOTE — Progress Notes (Signed)
Bipap removed at this time. Pt placed on 4L [place]. Tolerating well.

## 2020-08-17 NOTE — ED Provider Notes (Signed)
[facility] Emergency Department Provider Note ____________________________________________   Event Date/Time   First MD Initiated Contact with Patient 08/17/20 1023     (approximate)  I have reviewed the triage vital signs and the nursing notes.   HISTORY  Chief Complaint Shortness of Breath  Level 5 caveat: History of present illness limited due to respiratory distress  HPI Darin Hopkins is a 60 y.o. male with a history of COPD (but no known CHF history) who presents with shortness of breath over the last several days, gradual onset, worsening, and associated with some cough.  The patient denies chest pain.  Per EMS, O2 saturations were as low as the 60s to 70s when they arrived.  He was placed on CPAP, given duo nebs as well as IV Solu-Medrol and improved significantly.   Past Medical History:  Diagnosis Date  . COPD (chronic obstructive pulmonary disease) (HCC)   . Hypertension     Patient Active Problem List   Diagnosis Date Noted  . Acute exacerbation of CHF (congestive heart failure) (HCC) 08/17/2020  . Essential hypertension 08/17/2020  . History of seizure 08/17/2020  . COPD (chronic obstructive pulmonary disease) (HCC) 08/17/2020  . Hyperlipidemia 08/17/2020  . GERD (gastroesophageal reflux disease) 08/17/2020      Prior to Admission medications   Not on File    Allergies Patient has no known allergies.  History reviewed. No pertinent family history.  Social History Social History   Tobacco Use  . Smoking status: Current Every Day Smoker    Packs/day: 2.00    Types: Cigarettes  . Smokeless tobacco: Never Used  Vaping Use  . Vaping Use: Never used  Substance Use Topics  . Alcohol use: Yes    Alcohol/week: 3.0 standard drinks    Types: 3 Cans of beer per week    Comment: daily  . Drug use: Not Currently    Review of Systems Level 5 caveat: Review of systems limited due to respiratory distress  Cardiovascular:  Denies chest pain. Respiratory: Positive for shortness of breath. Gastrointestinal: No vomiting. Genitourinary: Negative for flank pain. Musculoskeletal: Negative for back pain.   ____________________________________________   PHYSICAL EXAM:  VITAL SIGNS: ED Triage Vitals  Enc Vitals Group     BP 08/17/20 1028 123/88     Pulse Rate 08/17/20 1028 63     Resp 08/17/20 1028 15     Temp 08/17/20 1028 (!) 97 F (36.1 C)     Temp Source 08/17/20 1028 Axillary     SpO2 08/17/20 1026 100 %     Weight 08/17/20 1032 132 lb (59.9 kg)     Height 08/17/20 1032 5\' 7"  (1.702 m)     Head Circumference --      Peak Flow --      Pain Score 08/17/20 1031 0     Pain Loc --      Pain Edu? --      Excl. in GC? --     Constitutional: Alert and oriented.  Uncomfortable appearing, somewhat tremulous. Eyes: Conjunctivae are normal.  Head: Atraumatic. Nose: No congestion/rhinnorhea. Mouth/Throat: Mucous membranes are dry.   Neck: Normal range of motion.  Cardiovascular: Normal rate, regular rhythm. Grossly normal heart sounds.  Good peripheral circulation. Respiratory: Increased respiratory effort with accessory muscle use. Lungs with diminished breath sounds bilaterally, some faint wheezes. Gastrointestinal: Soft and nontender. No distention.  Genitourinary: No flank tenderness. Musculoskeletal: 2+ bilateral lower extremity edema.  Extremities warm and well perfused.  Neurologic:  Normal speech and language.  Motor intact in all extremities. Skin:  Skin is warm and dry. No rash noted. Psychiatric: Calm and cooperative.  ____________________________________________   LABS (all labs ordered are listed, but only abnormal results are displayed)  Labs Reviewed  COMPREHENSIVE METABOLIC PANEL - Abnormal; Notable for the following components:      Result Value   Sodium 146 (*)    Chloride 112 (*)    Glucose, Bld 103 (*)    BUN 41 (*)    Creatinine, Ser 1.54 (*)    Calcium 8.4 (*)    GFR,  Estimated 52 (*)    All other components within normal limits  CBC WITH DIFFERENTIAL/PLATELET - Abnormal; Notable for the following components:   MCV 103.5 (*)    Lymphs Abs 5.1 (*)    All other components within normal limits  BRAIN NATRIURETIC PEPTIDE - Abnormal; Notable for the following components:   B Natriuretic Peptide 1,306.1 (*)    All other components within normal limits  TROPONIN I (HIGH SENSITIVITY) - Abnormal; Notable for the following components:   Troponin I (High Sensitivity) 22 (*)    All other components within normal limits  RESP PANEL BY RT-PCR (FLU A&B, COVID) ARPGX2  HIV ANTIBODY (ROUTINE TESTING W REFLEX)  TSH  TROPONIN I (HIGH SENSITIVITY)   ____________________________________________  EKG  ED ECG REPORT I, Dionne Bucy, the attending physician, personally viewed and interpreted this ECG.  Date: 08/17/2020 EKG Time: 1051 Rate: 64 Rhythm: normal sinus rhythm QRS Axis: normal Intervals: normal ST/T Wave abnormalities: Nonspecific ST abnormalities Narrative Interpretation: Nonspecific ST abnormalities with no evidence of acute ischemia (incorrectly read by machine as acute MI due to poor baseline)  ____________________________________________  RADIOLOGY  Chest x-ray interpreted by me shows bilateral interstitial markings in the lower lung  ____________________________________________   PROCEDURES  Procedure(s) performed: No  Procedures  Critical Care performed: Yes  CRITICAL CARE Performed by: Dionne Bucy   Total critical care time: 30 minutes  Critical care time was exclusive of separately billable procedures and treating other patients.  Critical care was necessary to treat or prevent imminent or life-threatening deterioration.  Critical care was time spent personally by me on the following activities: development of treatment plan with patient and/or surrogate as well as nursing, discussions with consultants,  evaluation of patient's response to treatment, examination of patient, obtaining history from patient or surrogate, ordering and performing treatments and interventions, ordering and review of laboratory studies, ordering and review of radiographic studies, pulse oximetry and re-evaluation of patient's condition. ____________________________________________   INITIAL IMPRESSION / ASSESSMENT AND PLAN / ED COURSE  Pertinent labs & imaging results that were available during my care of the patient were reviewed by me and considered in my medical decision making (see chart for details).  60 year old male with a history of COPD presents with shortness of breath over the last several days.  Per EMS he was significantly hypoxic on arrival and was placed on CPAP.  He was given Solu-Medrol and duo nebs with significant improvement.  He has also recently had some leg swelling although has no known history of CHF.  I reviewed the past medical records in Epic; the patient was last admitted here for a COPD exacerbation in 2015, and has no ED visits or admissions since then.  On exam the patient is alert and somewhat uncomfortable appearing and tremulous.  He is speaking in short sentences.  On arrival, he was immediately placed on BiPAP and  his vital signs are currently normal.  Lung sounds are diminished bilaterally with some wheezing.  There is 2+ pitting edema to bilateral lower extremities.  Exam is otherwise unremarkable.  Differential includes COPD exacerbation, new onset CHF, pneumonia, acute bronchitis, COVID-19.  We will continue BiPAP, obtain a chest x-ray lab work-up, and reassess.  ----------------------------------------- 12:47 PM on 08/17/2020 -----------------------------------------  Chest x-ray shows interstitial opacities in the lower lungs.  Lab work-up is significant for elevated BNP.  Overall I suspect CHF exacerbation with possibly some component of COPD.  Covid is negative and there is  no clinical evidence for pneumonia.  The patient remains on BiPAP and appears much more comfortable.  I have ordered IV Lasix.  I consulted Dr. Sedalia Muta from the hospitalist service for admission.  __________________________  Darin Hopkins was evaluated in Emergency Department on 08/17/2020 for the symptoms described in the history of present illness. He was evaluated in the context of the global COVID-19 pandemic, which necessitated consideration that the patient might be at risk for infection with the SARS-CoV-2 virus that causes COVID-19. Institutional protocols and algorithms that pertain to the evaluation of patients at risk for COVID-19 are in a state of rapid change based on information released by regulatory bodies including the CDC and federal and state organizations. These policies and algorithms were followed during the patient's care in the ED.  ____________________________________________   FINAL CLINICAL IMPRESSION(S) / ED DIAGNOSES  Final diagnoses:  Acute respiratory failure with hypoxia (HCC)      NEW MEDICATIONS STARTED DURING THIS VISIT:  New Prescriptions   No medications on file     Note:  This document was prepared using Dragon voice recognition software and may include unintentional dictation errors.    Dionne Bucy, MD 08/17/20 1248

## 2020-08-17 NOTE — Progress Notes (Signed)
Pt placed back on bi-pap per patient request 

## 2020-08-17 NOTE — ED Triage Notes (Addendum)
Pt via ems from home with increases SOB past few day. Hx COPD. Pt with swelling to lower extremities and groin. On arrival of firedepartment pt o2 saturation 70%. Rale breath sound to lower lobes rhonchi to upper lobes. Pt on C pap on arrival. Pt received 2 duonebs, solumedrol, and nitropaste to chest. Pt placed on hospital BiPAP on arrival to ED.

## 2020-08-17 NOTE — Progress Notes (Signed)
Pt arrived on unit at this time. Pt has tremor, he is alert and oriented X4 on bipap. Pt denies pain. Breathing is labored. VSS.

## 2020-08-17 NOTE — H&P (Addendum)
History and Physical   Darin Hopkins OFB:[id number] DOB: 12-26-1960 DOA: 08/17/2020  PCP: Martie Round, NP  Outpatient Specialists: Dr. Glenna Fellows, neprhology Patient coming from: Home via EMS  I have personally briefly reviewed patient's old medical records in [facility] EMR.  Chief Concern: Shortness of breath  HPI: Darin Hopkins is a 60 y.o. male with medical history significant for hypertension, history of GERD, history of heart failure exacerbation, history of seizures, last seizure was approximately 2 years ago, CKD 3, hyperlipidemia, COPD, history of CVA about 2.5 years ago, alcohol abuse, tobacco abuse, presents to the emergency department for chief concerns of shortness of breath.  Patient reports that he has been having shortness of breath the last 3 to 4 days.  He states that shortness of breath is worse with exertion.  He also endorses bilateral lower extremity edema and swelling.  He denies sick contacts.  He states that he also has a new cough that started about 3 to 4 days ago.  He states that the cough is nonproductive.  He denies fever.  He endorses chills and denies nausea and vomiting, chest pain, abdominal pain, dysuria, diarrhea.   He endorses bilateral lower extremity swelling with pitting edema for 3 months.   Patient states he is compliant with medication though he has not seen a doctor in 2 years. He states the medications continue to be prescribe for him.   He states that he did not take any of his medications in the morning.  Per sister, Ms. Darin Glassing, patient drinks a lot of alcohol every single day. When she noticed that he did not drink any alcohol on Saturday, 08/16/20, that was when she knew her brother was not feeling well. She noticed his leg swelling and told him he needs to go to the doctor for further evaluation.   Social history: He lives with his sister.  He endorses daily tobacco use, 2 cigarettes/day.  He drinks about 1-2 alcoholic drinks 3-4 times  a week.  He denies current recreational drug use. He is disabled. His last last drink per sister was Saturday on 08/16/20. Per Sister, he drinks nearly a 1/5th per day. His sister states he smokes 0.5 ppd.   Vaccination: he is vaccinated for covid-19, three doses from [facility]  ROS: Constitutional: no weight change, no fever ENT/Mouth: no sore throat, no rhinorrhea Eyes: no eye pain, no vision changes Cardiovascular: no chest pain, + dyspnea,  + edema, no palpitations Respiratory: + cough, no sputum, no wheezing Gastrointestinal: no nausea, no vomiting, no diarrhea, no constipation Genitourinary: no urinary incontinence, no dysuria, no hematuria Musculoskeletal: no arthralgias, no myalgias Skin: no skin lesions, no pruritus, Neuro: + weakness, no loss of consciousness, no syncope Psych: no anxiety, no depression, + decrease appetite Heme/Lymph: no bruising, no bleeding  ED Course: Discussed with ED provider, patient requiring hospitalization due to heart failure exacerbation.  Vitals in the emergency department was remarkable for temperature of 97, respiration rate of 17, heart rate 110, blood pressure 115/88, patient SPO2 at 98% on BiPAP.  Chest x-ray was ordered and read by radiologist as possibility of atypical pneumonia.  Covid test was negative.  Labs in the emergency department was remarkable for sodium level of 146, potassium of 4.8, chloride 112, bicarb 27, BUN 41, serum creatinine of 1.54, nonfasting blood glucose of 1 03, WBC 9.9, hemoglobin 13.3, platelets 205, GFR estimated was 52.  BNP was elevated at 1306.  Initial high-sensitivity troponin was 22.  ED  provider gave 1 dose of Lasix 40 mg IV and started patient on BiPAP.  Assessment/Plan  Darin Hopkins is a 60 year old male with history of CVA about 2.5 years ago, seizure 2 years ago, hypertension, hyperlipidemia, daily alcohol abuse, daily tobacco abuse, presents to the emergency department for chief concerns of  shortness of breath for 3 to 4 days, this is likely cardiomegaly/new heart failure reduced ejection fraction exacerbation in setting of daily alcohol abuse.  Principal Problem:   Acute exacerbation of CHF (congestive heart failure) (HCC) Active Problems:   Essential hypertension   History of seizure   COPD (chronic obstructive pulmonary disease) (HCC)   Hyperlipidemia   GERD (gastroesophageal reflux disease)   Alcohol abuse   Shortness of breath, suspect secondary to new acute heart failure exacerbation- This is likely reduced ejection fraction from cardiomegaly due to chronic daily heavy alcohol use - Elevated BNP at 1306 -Complete echo ordered -Continue BiPAP -Lasix 40 mg daily will be continued -Strict I's and O's -AM team to consult cardiology if patient has new diagnosis of heart failure -Discussed plan with patient and he agrees, discussed at least year follow-up with pcp  Elevated troponin - suspect secondary to heart failure exacerbation - low clinical suspicion for ACS as patient denies chest pain/chest pressure, we will treat as above  History of seizures-last seizure was about 2 years ago, patient did not take any of his seizure medication this a.m. before EMT arrived -Resumed Keppra 500 mg twice daily including now -Seizure precautions  Hypertension-patient reports compliance with his medication except for day of presentation -Lisinopril-hydrochlorothiazide 20-25 mg daily, hydralazine 50 mg 3 times daily, amlodipine 5 mg daily at home -Currently normotensive at this time, I have resumed lisinopril 20 mg daily and amlodipine 5 mg daily -Added furosemide 40 mg IV daily starting 08/18/20  COPD -Symbicort 160-4.5-4.5 mcg inhaler twice daily -Does not appear patient is in exacerbation -Symbicort is not on formulary, I have replaced with Dulera  Alcohol abuse - drinks 1/5 of liquor per day per sister, CIWA protocal initiated Tobacco abuse - nicotine  patch Hyperlipidemia-atorvastatin 20 mg daily GERD-omeprazole 20 mg daily History of CVA - resumed asa 81 mg daily and atorvastatin 20 mg daily  Health maintenance: patient states he has never had a colonoscopy and he will need to do this on discharge via PCP. He endorses understanding and compliance  Chart reviewed.   DVT prophylaxis: Enoxaparin 40 mg subcutaneous, TED hose Code Status: full code Diet: Heart healthy Family Communication: updated, sister Ms. Darin Hopkins per patient's request Disposition Plan: Pending clinical course and complete echo Consults called: None at this time Admission status: Admit to stepdown, with observation, with telemetry  Past Medical History:  Diagnosis Date  . COPD (chronic obstructive pulmonary disease) (HCC)   . Hypertension    History reviewed. No pertinent surgical history.  Social History:  reports that he has been smoking cigarettes. He has been smoking about 2.00 packs per day. He has never used smokeless tobacco. He reports current alcohol use of about 3.0 standard drinks of alcohol per week. He reports previous drug use.  No Known Allergies History reviewed. No pertinent family history. Family history: Family history reviewed and not pertinent  Physical Exam: Vitals:   08/17/20 1103 08/17/20 1130 08/17/20 1200 08/17/20 1230  BP: 130/81 128/87 115/88 124/83  Pulse: 64 61 66 70  Resp: 15 17 17 17   Temp:      TempSrc:      SpO2: 100% 100%  100% 100%  Weight:      Height:       Constitutional: appears older than chronological age, frail, cachectic, NAD, calm, comfortable Eyes: PERRL, lids and conjunctivae normal ENMT: Mucous membranes are moist. Posterior pharynx clear of any exudate or lesions. Age-appropriate dentition. Hearing appropriate Neck: normal, supple, no masses, no thyromegaly Respiratory: clear to auscultation bilaterally, no wheezing, no crackles. Normal respiratory effort. No accessory muscle use. Bipap mask in  place Cardiovascular: Regular rate and rhythm, no murmurs / rubs / gallops. 3+ bilateral lower extremity pitting edema. 2+ pedal pulses. No carotid bruits.  Abdomen: no tenderness, no masses palpated, no hepatosplenomegaly. Bowel sounds positive.  Musculoskeletal: no clubbing / cyanosis. No joint deformity upper and lower extremities. Good ROM, no contractures, no atrophy. Normal muscle tone.  Skin: no rashes, lesions, ulcers. No induration Neurologic: Sensation intact. Strength 5/5 in all 4.  Psychiatric: Normal judgment and insight. Alert and oriented x 3. Normal mood.   EKG: independently reviewed, showing sinus rhythm, rate of 64, QTc 434  Chest x-ray on Admission: I personally reviewed and I agree with radiologist reading as below.  DG Chest Portable 1 View  Result Date: 08/17/2020 CLINICAL DATA:  Shortness of breath. EXAM: PORTABLE CHEST 1 VIEW COMPARISON:  November 01, 2013 FINDINGS: Cardiomediastinal silhouette is normal. Mediastinal contours appear intact. Coarsening of the interstitial markings in the lower lobes may represent peribronchial airspace consolidation. Osseous structures are without acute abnormality. Soft tissues are grossly normal. IMPRESSION: Coarsening of the interstitial markings in the lower lobes may represent peribronchial airspace consolidation, suggestive of bronchitis or atypical pneumonia. Electronically Signed   By: Ted Mcalpine M.D.   On: 08/17/2020 10:57   Labs on Admission: I have personally reviewed following labs  CBC: Recent Labs  Lab 08/17/20 1037  WBC 9.9  NEUTROABS 4.2  HGB 13.3  HCT 44.3  MCV 103.5*  PLT 204   Basic Metabolic Panel: Recent Labs  Lab 08/17/20 1037  NA 146*  K 4.8  CL 112*  CO2 27  GLUCOSE 103*  BUN 41*  CREATININE 1.54*  CALCIUM 8.4*   GFR: Estimated Creatinine Clearance: 43.8 mL/min (A) (by C-G formula based on SCr of 1.54 mg/dL (H)).  Liver Function Tests: Recent Labs  Lab 08/17/20 1037  AST 22  ALT 14   ALKPHOS 40  BILITOT 0.6  PROT 6.9  ALBUMIN 3.6   Urine analysis:    Component Value Date/Time   COLORURINE Yellow 11/02/2013 0205   APPEARANCEUR Clear 11/02/2013 0205   LABSPEC 1.005 11/02/2013 0205   PHURINE 5.0 11/02/2013 0205   GLUCOSEU Negative 11/02/2013 0205   HGBUR 1+ 11/02/2013 0205   BILIRUBINUR Negative 11/02/2013 0205   KETONESUR Negative 11/02/2013 0205   PROTEINUR 75 mg/dL 34/28/7681 1572   NITRITE Negative 11/02/2013 0205   LEUKOCYTESUR Negative 11/02/2013 0205   CRITICAL CARE Performed by: Nadyne Coombes Afnan Cadiente  Total critical care time: 35 minutes  Critical care time was exclusive of separately billable procedures and treating other patients.  Critical care was necessary to treat or prevent imminent or life-threatening deterioration. Circulatory failure   Critical care was time spent personally by me on the following activities: development of treatment plan with patient and/or surrogate as well as nursing, discussions with consultants, evaluation of patient's response to treatment, examination of patient, obtaining history from patient or surrogate, ordering and performing treatments and interventions, ordering and review of laboratory studies, ordering and review of radiographic studies, pulse oximetry and re-evaluation of patient's condition.  Zayne Draheim N Kaelani Kendrick D.O. [facility]  If 7PM-7AM, please contact overnight-coverage provider If 7AM-7PM, please contact day coverage provider [email or web address]  08/17/2020, 1:16 PM

## 2020-08-18 ENCOUNTER — Observation Stay (HOSPITAL_COMMUNITY)
Admit: 2020-08-18 | Discharge: 2020-08-18 | Disposition: A | Discharge status: Home / Self Care | Payer: Medicaid Other | Attending: Internal Medicine | Admitting: Internal Medicine

## 2020-08-18 DIAGNOSIS — I2510 Atherosclerotic heart disease of native coronary artery without angina pectoris: Secondary | ICD-10-CM | POA: Diagnosis present

## 2020-08-18 DIAGNOSIS — J449 Chronic obstructive pulmonary disease, unspecified: Secondary | ICD-10-CM | POA: Diagnosis present

## 2020-08-18 DIAGNOSIS — F17210 Nicotine dependence, cigarettes, uncomplicated: Secondary | ICD-10-CM | POA: Diagnosis present

## 2020-08-18 DIAGNOSIS — J9601 Acute respiratory failure with hypoxia: Secondary | ICD-10-CM | POA: Diagnosis not present

## 2020-08-18 DIAGNOSIS — J431 Panlobular emphysema: Secondary | ICD-10-CM | POA: Diagnosis not present

## 2020-08-18 DIAGNOSIS — Q231 Congenital insufficiency of aortic valve: Secondary | ICD-10-CM | POA: Diagnosis not present

## 2020-08-18 DIAGNOSIS — Z20822 Contact with and (suspected) exposure to covid-19: Secondary | ICD-10-CM | POA: Diagnosis present

## 2020-08-18 DIAGNOSIS — Z8673 Personal history of transient ischemic attack (TIA), and cerebral infarction without residual deficits: Secondary | ICD-10-CM | POA: Diagnosis not present

## 2020-08-18 DIAGNOSIS — I712 Thoracic aortic aneurysm, without rupture: Secondary | ICD-10-CM | POA: Diagnosis not present

## 2020-08-18 DIAGNOSIS — R0609 Other forms of dyspnea: Secondary | ICD-10-CM | POA: Diagnosis not present

## 2020-08-18 DIAGNOSIS — I509 Heart failure, unspecified: Secondary | ICD-10-CM

## 2020-08-18 DIAGNOSIS — E43 Unspecified severe protein-calorie malnutrition: Secondary | ICD-10-CM | POA: Diagnosis present

## 2020-08-18 DIAGNOSIS — I248 Other forms of acute ischemic heart disease: Secondary | ICD-10-CM | POA: Diagnosis present

## 2020-08-18 DIAGNOSIS — I5033 Acute on chronic diastolic (congestive) heart failure: Secondary | ICD-10-CM | POA: Diagnosis not present

## 2020-08-18 DIAGNOSIS — Z6821 Body mass index (BMI) 21.0-21.9, adult: Secondary | ICD-10-CM | POA: Diagnosis not present

## 2020-08-18 DIAGNOSIS — I77810 Thoracic aortic ectasia: Secondary | ICD-10-CM | POA: Diagnosis present

## 2020-08-18 DIAGNOSIS — R64 Cachexia: Secondary | ICD-10-CM | POA: Diagnosis present

## 2020-08-18 DIAGNOSIS — K219 Gastro-esophageal reflux disease without esophagitis: Secondary | ICD-10-CM | POA: Diagnosis present

## 2020-08-18 DIAGNOSIS — F1010 Alcohol abuse, uncomplicated: Secondary | ICD-10-CM | POA: Diagnosis not present

## 2020-08-18 DIAGNOSIS — R42 Dizziness and giddiness: Secondary | ICD-10-CM | POA: Diagnosis not present

## 2020-08-18 DIAGNOSIS — I5031 Acute diastolic (congestive) heart failure: Secondary | ICD-10-CM | POA: Diagnosis not present

## 2020-08-18 DIAGNOSIS — I130 Hypertensive heart and chronic kidney disease with heart failure and stage 1 through stage 4 chronic kidney disease, or unspecified chronic kidney disease: Secondary | ICD-10-CM | POA: Diagnosis present

## 2020-08-18 DIAGNOSIS — Z7982 Long term (current) use of aspirin: Secondary | ICD-10-CM | POA: Diagnosis not present

## 2020-08-18 DIAGNOSIS — R54 Age-related physical debility: Secondary | ICD-10-CM | POA: Diagnosis present

## 2020-08-18 DIAGNOSIS — R079 Chest pain, unspecified: Secondary | ICD-10-CM | POA: Diagnosis not present

## 2020-08-18 DIAGNOSIS — J810 Acute pulmonary edema: Secondary | ICD-10-CM | POA: Diagnosis not present

## 2020-08-18 DIAGNOSIS — E785 Hyperlipidemia, unspecified: Secondary | ICD-10-CM | POA: Diagnosis present

## 2020-08-18 DIAGNOSIS — G40909 Epilepsy, unspecified, not intractable, without status epilepticus: Secondary | ICD-10-CM | POA: Diagnosis present

## 2020-08-18 DIAGNOSIS — N1831 Chronic kidney disease, stage 3a: Secondary | ICD-10-CM | POA: Diagnosis present

## 2020-08-18 LAB — CBC WITH DIFFERENTIAL/PLATELET
Abs Immature Granulocytes: 0.02 10*3/uL (ref 0.00–0.07)
Basophils Absolute: 0 10*3/uL (ref 0.0–0.1)
Basophils Relative: 0 %
Eosinophils Absolute: 0 10*3/uL (ref 0.0–0.5)
Eosinophils Relative: 0 %
HCT: 41.1 % (ref 39.0–52.0)
Hemoglobin: 12.9 g/dL — ABNORMAL LOW (ref 13.0–17.0)
Immature Granulocytes: 0 %
Lymphocytes Relative: 53 %
Lymphs Abs: 3.8 10*3/uL (ref 0.7–4.0)
MCH: 31.5 pg (ref 26.0–34.0)
MCHC: 31.4 g/dL (ref 30.0–36.0)
MCV: 100.5 fL — ABNORMAL HIGH (ref 80.0–100.0)
Monocytes Absolute: 0.3 10*3/uL (ref 0.1–1.0)
Monocytes Relative: 5 %
Neutro Abs: 3 10*3/uL (ref 1.7–7.7)
Neutrophils Relative %: 42 %
Platelets: 213 10*3/uL (ref 150–400)
RBC: 4.09 MIL/uL — ABNORMAL LOW (ref 4.22–5.81)
RDW: 13.3 % (ref 11.5–15.5)
WBC: 7.1 10*3/uL (ref 4.0–10.5)
nRBC: 0 % (ref 0.0–0.2)

## 2020-08-18 LAB — ECHOCARDIOGRAM COMPLETE
AR max vel: 1.15 cm2
AV Area VTI: 1.31 cm2
AV Area mean vel: 1.12 cm2
AV Mean grad: 18 mmHg
AV Peak grad: 30 mmHg
Ao pk vel: 2.74 m/s
Area-P 1/2: 3.26 cm2
Height: 67 in
P 1/2 time: 475 msec
S' Lateral: 3.07 cm
Weight: 2225.76 oz

## 2020-08-18 LAB — BASIC METABOLIC PANEL
Anion gap: 6 (ref 5–15)
BUN: 43 mg/dL — ABNORMAL HIGH (ref 6–20)
CO2: 30 mmol/L (ref 22–32)
Calcium: 8.4 mg/dL — ABNORMAL LOW (ref 8.9–10.3)
Chloride: 107 mmol/L (ref 98–111)
Creatinine, Ser: 1.73 mg/dL — ABNORMAL HIGH (ref 0.61–1.24)
GFR, Estimated: 45 mL/min — ABNORMAL LOW (ref >60)
Glucose, Bld: 93 mg/dL (ref 70–99)
Potassium: 4.7 mmol/L (ref 3.5–5.1)
Sodium: 143 mmol/L (ref 135–145)

## 2020-08-18 LAB — GLUCOSE, CAPILLARY: Glucose-Capillary: 110 mg/dL — ABNORMAL HIGH (ref 70–99)

## 2020-08-18 MED ORDER — FUROSEMIDE 10 MG/ML IJ SOLN
40.0000 mg | Freq: Two times a day (BID) | INTRAMUSCULAR | Status: DC
  Administered 2020-08-18 (×2): 40 mg via INTRAVENOUS
  Filled 2020-08-18 (×2): qty 4

## 2020-08-18 NOTE — Progress Notes (Signed)
Pt transferred to PCU, report called to [facility].  PT transported w/ 2L [place], belongings meds and chart transferred with him

## 2020-08-18 NOTE — Progress Notes (Addendum)
PROGRESS NOTE    Darin Hopkins  OHY:[id number] DOB: 24-Aug-1960 DOA: 08/17/2020 PCP: Martie Round, NP   Brief Narrative:   60 y.o. male with medical history significant for hypertension, history of GERD, history of heart failure exacerbation, history of seizures, last seizure was approximately 2 years ago, CKD 3, hyperlipidemia, COPD, history of CVA about 2.5 years ago, alcohol abuse, tobacco abuse, presents to the emergency department for chief concerns of shortness of breath.  Patient reports that he has been having shortness of breath the last 3 to 4 days.  He states that shortness of breath is worse with exertion.  He also endorses bilateral lower extremity edema and swelling.  He denies sick contacts.  He states that he also has a new cough that started about 3 to 4 days ago.  He states that the cough is nonproductive.  He denies fever.  He endorses chills and denies nausea and vomiting, chest pain, abdominal pain, dysuria, diarrhea.   He endorses bilateral lower extremity swelling with pitting edema for 3 months.   Patient states he is compliant with medication though he has not seen a doctor in 2 years. He states the medications continue to be prescribe for him.   BNP elevated at 1306.  Interestingly TTE performed demonstrates normal ejection fraction and normal diastolic parameters.  Patient has been weaned off BiPAP and is saturating well on 2 L nasal cannula.  Continues diurese appropriately.   Assessment & Plan:   Principal Problem:   Acute exacerbation of CHF (congestive heart failure) (HCC) Active Problems:   Essential hypertension   History of seizure   COPD (chronic obstructive pulmonary disease) (HCC)   Hyperlipidemia   GERD (gastroesophageal reflux disease)   Alcohol abuse   Acute decompensated heart failure (HCC)  Acute decompensated diastolic heart failure Acute hypoxic respiratory failure secondary to above Patient had elevated BNP of 1306 Clinical signs  of fluid overload including pulmonary edema and lower extremity edema Was requiring BiPAP, now weaned off Doing well on 2 L nasal cannula Diuresing appropriately Plan: Continue diuresis, IV Lasix 40 mg twice daily Target net -1-1.5 L daily Daily weights Strict I's and O's Continue ACE inhibitor Cardiology consultation versus outpatient referral  Elevated troponin Suspect supply demand ischemia in the setting of heart failure exacerbation Continue telemetry monitoring  History of seizures last seizure was about 2 years ago, patient did not take any of his seizure medication this a.m. before EMT arrived -Resumed Keppra 500 mg twice daily -Seizure precautions  Hypertension patient reports compliance with his medication except for day of presentation -Lisinopril-hydrochlorothiazide 20-25 mg daily, hydralazine 50 mg 3 times daily, amlodipine 5 mg daily at home -Currently normotensive at this time, I have resumed lisinopril 20 mg daily and amlodipine 5 mg daily -Added furosemide 40 mg IV BID starting 08/18/20  COPD -Symbicort 160-4.5-4.5 mcg inhaler twice daily -Does not appear patient is in exacerbation -Symbicort is not on formulary, I have replaced with Dulera  Alcohol abuse  drinks 1/5 of liquor per day per sister, CIWA protocal initiated Tobacco abuse - nicotine patch Hyperlipidemia-atorvastatin 20 mg daily GERD-omeprazole 20 mg daily History of CVA - resumed asa 81 mg daily and atorvastatin 20 mg daily    DVT prophylaxis: SQ Lovenox Code Status: Full Family Communication: Sister [name] [phone] on 4/11.  Disposition Plan: Status is: Inpatient  Remains inpatient appropriate because:Inpatient level of care appropriate due to severity of illness   Dispo: The patient is from: Home  Anticipated d/c is to: Home              Patient currently is not medically stable to d/c.   Difficult to place patient No  Fluid overload, suspect diasoltic HF.   Possible discharge in 24 hours     Level of care: Progressive Cardiac  Consultants:   None   Procedures: None  Antimicrobials:   None   Subjective: Patient seen and examined.  Endorses improvement in shortness of breath since admission.  No pain complaints.  Objective: Vitals:   08/18/20 1000 08/18/20 1100 08/18/20 1137 08/18/20 1457  BP: 106/75 (!) 89/67 106/75   Pulse: (!) 59 60 (!) 59   Resp: Temp:   97.8 F (36.6 C)   TempSrc:   Oral   SpO2: 98% 96% 96%   Weight:    63.7 kg  Height:        Intake/Output Summary (Last 24 hours) at 08/18/2020 1559 Last data filed at 08/18/2020 1329 Gross per 24 hour  Intake 1560 ml  Output 1150 ml  Net 410 ml   Filed Weights   08/17/20 1338 08/18/20 0500 08/18/20 1457  Weight: 63.2 kg 63.1 kg 63.7 kg    Examination:  General exam: Appears calm and comfortable  Respiratory system: Bibasilar crackles.  Normal work of breathing.  2 L Cardiovascular system: S1 & S2 heard, RRR. No JVD, murmurs, rubs, gallops or clicks.  2+ pitting edema bilateral lower extremities Gastrointestinal system: Abdomen is nondistended, soft and nontender. No organomegaly or masses felt. Normal bowel sounds heard. Central nervous system: Alert and oriented. No focal neurological deficits. Extremities: Symmetric 5 x 5 power. Skin: No rashes, lesions or ulcers Psychiatry: Judgement and insight appear normal. Mood & affect appropriate.     Data Reviewed: I have personally reviewed following labs and imaging studies  CBC: Recent Labs  Lab 08/17/20 1037 08/18/20 0329  WBC 9.9 7.1  NEUTROABS 4.2 3.0  HGB 13.3 12.9*  HCT 44.3 41.1  MCV 103.5* 100.5*  PLT 204 213   Basic Metabolic Panel: Recent Labs  Lab 08/17/20 1037 08/17/20 1418 08/18/20 0329  NA 146*  --  143  K 4.8  --  4.7  CL 112*  --  107  CO2 27  --  30  GLUCOSE 103*  --  93  BUN 41*  --  43*  CREATININE 1.54*  --  1.73*  CALCIUM 8.4*  --  8.4*  MG  --  1.9  --    PHOS  --  3.6  --    GFR: Estimated Creatinine Clearance: 41.4 mL/min (A) (by C-G formula based on SCr of 1.73 mg/dL (H)). Liver Function Tests: Recent Labs  Lab 08/17/20 1037  AST 22  ALT 14  ALKPHOS 40  BILITOT 0.6  PROT 6.9  ALBUMIN 3.6   No results for input(s): LIPASE, AMYLASE in the last 168 hours. No results for input(s): AMMONIA in the last 168 hours. Coagulation Profile: No results for input(s): INR, PROTIME in the last 168 hours. Cardiac Enzymes: No results for input(s): CKTOTAL, CKMB, CKMBINDEX, TROPONINI in the last 168 hours. BNP (last 3 results) No results for input(s): PROBNP in the last 8760 hours. HbA1C: No results for input(s): HGBA1C in the last 72 hours. CBG: Recent Labs  Lab 08/17/20 1342  GLUCAP 110*   Lipid Profile: No results for input(s): CHOL, HDL, LDLCALC, TRIG, CHOLHDL, LDLDIRECT in the last 72 hours. Thyroid Function Tests: Recent Labs  08/17/20 1418  TSH 2.303   Anemia Panel: No results for input(s): VITAMINB12, FOLATE, FERRITIN, TIBC, IRON, RETICCTPCT in the last 72 hours. Sepsis Labs: No results for input(s): PROCALCITON, LATICACIDVEN in the last 168 hours.  Recent Results (from the past 240 hour(s))  Resp Panel by RT-PCR (Flu A&B, Covid) Nasopharyngeal Swab     Status: None   Collection Time: 08/17/20 10:38 AM   Specimen: Nasopharyngeal Swab; Nasopharyngeal(NP) swabs in vial transport medium  Result Value Ref Range Status   SARS Coronavirus 2 by RT PCR NEGATIVE NEGATIVE Final    Comment: (NOTE) SARS-CoV-2 target nucleic acids are NOT DETECTED.  The SARS-CoV-2 RNA is generally detectable in upper respiratory specimens during the acute phase of infection. The lowest concentration of SARS-CoV-2 viral copies this assay can detect is 138 copies/mL. A negative result does not preclude SARS-Cov-2 infection and should not be used as the sole basis for treatment or other patient management decisions. A negative result may occur  with  improper specimen collection/handling, submission of specimen other than nasopharyngeal swab, presence of viral mutation(s) within the areas targeted by this assay, and inadequate number of viral copies(<138 copies/mL). A negative result must be combined with clinical observations, patient history, and epidemiological information. The expected result is Negative.  Fact Sheet for Patients:  BloggerCourse.com  Fact Sheet for Healthcare Providers:  SeriousBroker.it  This test is no t yet approved or cleared by the [place] FDA and  has been authorized for detection and/or diagnosis of SARS-CoV-2 by FDA under an Emergency Use Authorization (EUA). This EUA will remain  in effect (meaning this test can be used) for the duration of the COVID-19 declaration under Section 564(b)(1) of the Act, 21 U.S.C.section 360bbb-3(b)(1), unless the authorization is terminated  or revoked sooner.       Influenza A by PCR NEGATIVE NEGATIVE Final   Influenza B by PCR NEGATIVE NEGATIVE Final    Comment: (NOTE) The Xpert Xpress SARS-CoV-2/FLU/RSV plus assay is intended as an aid in the diagnosis of influenza from Nasopharyngeal swab specimens and should not be used as a sole basis for treatment. Nasal washings and aspirates are unacceptable for Xpert Xpress SARS-CoV-2/FLU/RSV testing.  Fact Sheet for Patients: BloggerCourse.com  Fact Sheet for Healthcare Providers: SeriousBroker.it  This test is not yet approved or cleared by the [place] FDA and has been authorized for detection and/or diagnosis of SARS-CoV-2 by FDA under an Emergency Use Authorization (EUA). This EUA will remain in effect (meaning this test can be used) for the duration of the COVID-19 declaration under Section 564(b)(1) of the Act, 21 U.S.C. section 360bbb-3(b)(1), unless the authorization is terminated  or revoked.  Performed at [facility], [address] [place], [place] [address]   MRSA PCR Screening     Status: None   Collection Time: 08/17/20  1:45 PM   Specimen: Nasal Mucosa; Nasopharyngeal  Result Value Ref Range Status   MRSA by PCR NEGATIVE NEGATIVE Final    Comment:        The GeneXpert MRSA Assay (FDA approved for NASAL specimens only), is one component of a comprehensive MRSA colonization surveillance program. It is not intended to diagnose MRSA infection nor to guide or monitor treatment for MRSA infections. Performed at [facility], [address] [place], [place] [address]          Radiology Studies: DG Chest Portable 1 View  Result Date: 08/17/2020 CLINICAL DATA:  Shortness of breath. EXAM: PORTABLE CHEST 1 VIEW COMPARISON:  November 01, 2013 FINDINGS: Cardiomediastinal silhouette is normal. Mediastinal contours appear intact. Coarsening of the interstitial markings in the lower lobes may represent peribronchial airspace consolidation. Osseous structures are without acute abnormality. Soft tissues are grossly normal. IMPRESSION: Coarsening of the interstitial markings in the lower lobes may represent peribronchial airspace consolidation, suggestive of bronchitis or atypical pneumonia. Electronically Signed   By: Ted Mcalpineobrinka  Dimitrova M.D.   On: 08/17/2020 10:57   ECHOCARDIOGRAM COMPLETE  Result Date: 08/18/2020    ECHOCARDIOGRAM REPORT   Patient Name:   Darin Hopkins Date of Exam: 08/18/2020 Medical Rec #:  [id number]       Height:       67.0 in Accession #:    [id number]      Weight:       139.1 lb Date of Birth:  Sep 16, 1960       BSA:          1.733 m Patient Age:    59 years        BP:           109/79 mmHg Patient Gender: M               HR:           63 bpm. Exam Location:  [facility] Procedure: 2D Echo, Cardiac Doppler, Color Doppler and Strain Analysis Indications:     Dyspnea R06.00  History:         Patient has no prior history of  Echocardiogram examinations.                  COPD; Risk Factors:Hypertension.  Sonographer:     Cristela BlueJerry Hege RDCS (AE) Referring Phys:  [id number] AMY N COX Diagnosing Phys: Yvonne Kendallhristopher End MD  Sonographer Comments: Global longitudinal strain was attempted. IMPRESSIONS  1. Left ventricular ejection fraction, by estimation, is 60 to 65%. The left ventricle has normal function. The left ventricle has no regional wall motion abnormalities. There is mild left ventricular hypertrophy. Left ventricular diastolic parameters were normal. The average left ventricular global longitudinal strain is -15.1 %. The global longitudinal strain is abnormal.  2. Right ventricular systolic function is normal. The right ventricular size is normal. There is normal pulmonary artery systolic pressure.  3. Left atrial size was mildly dilated.  4. Right atrial size was moderately dilated.  5. The mitral valve is normal in structure. Trivial mitral valve regurgitation. No evidence of mitral stenosis.  6. The aortic valve is bicuspid. There is mild calcification of the aortic valve. There is moderate thickening of the aortic valve. Aortic valve regurgitation is mild to moderate. Moderate aortic valve stenosis.  7. Aortic dilatation noted. There is mild dilatation of the aortic root, measuring 41 mm. There is mild dilatation of the ascending aorta, measuring 40 mm.  8. The inferior vena cava is normal in size with greater than 50% respiratory variability, suggesting right atrial pressure of 3 mmHg. FINDINGS  Left Ventricle: Left ventricular ejection fraction, by estimation, is 60 to 65%. The left ventricle has normal function. The left ventricle has no regional wall motion abnormalities. The average left ventricular global longitudinal strain is -15.1 %. The global longitudinal strain is abnormal. The left ventricular internal cavity size was normal in size. There is mild left ventricular hypertrophy. Left ventricular diastolic parameters were  normal. Right Ventricle: The right ventricular size is normal. No increase in right ventricular wall thickness. Right ventricular systolic function is normal. There is normal pulmonary artery systolic pressure. The  tricuspid regurgitant velocity is 2.16 m/s, and  with an assumed right atrial pressure of 3 mmHg, the estimated right ventricular systolic pressure is 21.7 mmHg. Left Atrium: Left atrial size was mildly dilated. Right Atrium: Right atrial size was moderately dilated. Pericardium: There is no evidence of pericardial effusion. Mitral Valve: The mitral valve is normal in structure. Trivial mitral valve regurgitation. No evidence of mitral valve stenosis. Tricuspid Valve: The tricuspid valve is normal in structure. Tricuspid valve regurgitation is trivial. Aortic Valve: The aortic valve is bicuspid. There is mild calcification of the aortic valve. There is moderate thickening of the aortic valve. Aortic valve regurgitation is mild to moderate. Aortic regurgitation PHT measures 475 msec. Moderate aortic stenosis is present. Aortic valve mean gradient measures 18.0 mmHg. Aortic valve peak gradient measures 30.0 mmHg. Aortic valve area, by VTI measures 1.31 cm. Pulmonic Valve: The pulmonic valve was not well visualized. Pulmonic valve regurgitation is not visualized. No evidence of pulmonic stenosis. Aorta: Aortic dilatation noted. There is mild dilatation of the aortic root, measuring 41 mm. There is mild dilatation of the ascending aorta, measuring 40 mm. Pulmonary Artery: The pulmonary artery is not well seen. Venous: The inferior vena cava is normal in size with greater than 50% respiratory variability, suggesting right atrial pressure of 3 mmHg. IAS/Shunts: The interatrial septum was not well visualized.  LEFT VENTRICLE PLAX 2D LVIDd:         5.24 cm  Diastology LVIDs:         3.07 cm  LV e' medial:    9.90 cm/s LV PW:         1.42 cm  LV E/e' medial:  11.2 LV IVS:        0.90 cm  LV e' lateral:   11.40  cm/s LVOT diam:     2.20 cm  LV E/e' lateral: 9.7 LV SV:         69 LV SV Index:   40       2D Longitudinal Strain LVOT Area:     3.80 cm 2D Strain GLS Avg:     -15.1 %  RIGHT VENTRICLE RV Basal diam:  3.77 cm RV S prime:     13.40 cm/s TAPSE (M-mode): 2.2 cm LEFT ATRIUM             Index       RIGHT ATRIUM           Index LA diam:        3.70 cm 2.13 cm/m  RA Area:     25.20 cm LA Vol (A2C):   73.3 ml 42.29 ml/m RA Volume:   91.70 ml  52.91 ml/m LA Vol (A4C):   63.9 ml 36.87 ml/m LA Biplane Vol: 70.6 ml 40.74 ml/m  AORTIC VALVE                    PULMONIC VALVE AV Area (Vmax):    1.15 cm     PV Vmax:        0.60 m/s AV Area (Vmean):   1.12 cm     PV Peak grad:   1.4 mmHg AV Area (VTI):     1.31 cm     RVOT Peak grad: 2 mmHg AV Vmax:           274.00 cm/s AV Vmean:          197.000 cm/s AV VTI:            0.525  m AV Peak Grad:      30.0 mmHg AV Mean Grad:      18.0 mmHg LVOT Vmax:         82.90 cm/s LVOT Vmean:        58.100 cm/s LVOT VTI:          0.181 m LVOT/AV VTI ratio: 0.34 AI PHT:            475 msec  AORTA Ao Root diam: 3.83 cm MITRAL VALVE                TRICUSPID VALVE MV Area (PHT): 3.26 cm     TR Peak grad:   18.7 mmHg MV Decel Time: 233 msec     TR Vmax:        216.00 cm/s MV E velocity: 111.00 cm/s MV A velocity: 111.00 cm/s  SHUNTS MV E/A ratio:  1.00         Systemic VTI:  0.18 m                             Systemic Diam: 2.20 cm Yvonne Kendall MD Electronically signed by Yvonne Kendall MD Signature Date/Time: 08/18/2020/1:04:14 PM    Final         Scheduled Meds: . amLODipine  5 mg Oral QHS  . aspirin EC  81 mg Oral Daily  . Chlorhexidine Gluconate Cloth  6 each Topical Q2200  . enoxaparin (LOVENOX) injection  40 mg Subcutaneous Q24H  . folic acid  1 mg Oral Daily  . furosemide  40 mg Intravenous Q12H  . Ipratropium-Albuterol  1 puff Inhalation QID  . levETIRAcetam  500 mg Oral BID  . lisinopril  20 mg Oral Daily  . mometasone-formoterol  2 puff Inhalation BID  .  multivitamin with minerals  1 tablet Oral Daily  . pantoprazole  40 mg Oral Daily  . sodium chloride flush  3 mL Intravenous Q12H  . thiamine  100 mg Oral Daily   Or  . thiamine  100 mg Intravenous Daily   Continuous Infusions: . sodium chloride       LOS: 0 days    Time spent: 25 minutes    Tresa Moore, MD [facility] Pager [phone]  If 7PM-7AM, please contact night-coverage 08/18/2020, 3:59 PM

## 2020-08-18 NOTE — Progress Notes (Signed)
Unable to obtain Reds reading due to poor quality

## 2020-08-18 NOTE — Progress Notes (Signed)
*  PRELIMINARY RESULTS* Echocardiogram 2D Echocardiogram has been performed.  Cristela Blue 08/18/2020, 9:32 AM

## 2020-08-18 NOTE — Consult Note (Signed)
   Heart Failure Nurse Navigator Note  HFpEF 60 to 65%.  Mild LVH.  Abnormal longitudinal strain.  Right ventricular systolic function is normal.  Mild left atrial enlargement.  Moderate right atrial enlargement.  He presented to the emergency room with 3 to 4-day worsening of shortness of breath, cough and lower extremity edema.  Comorbidities:  COPD Hypertension Chronic kidney disease stage III Hyperlipidemia  He has history of stroke. Known alcohol and tobacco abuse.  Medications:  Amlodipine 5 mg at bedtime Aspirin 81 mg daily Furosemide 40 mg IV every 12 Lisinopril 20 mg daily Nicotine patch  Labs:  Sodium 143, potassium 4.7, chloride 107, CO2 30, BUN 43, creatinine 1.73 up from 1.54 of yesterday Intake 480 mL Output 1400 mL  weight is 63.7 kg  Reds vest reading 20%   Assessment:  General-he is awake and alert lying in bed, in no acute distress.  HEENT-pupils are equal  Cardiac -heart tones are regular rate and rhythm.  Chest-lungs are clear to posterior auscultation  Abdomen-soft nontender  Musculoskeletal-1+ pitting edema skin is starting to wrinkle.  Psych-is pleasant and appropriate  Neurologic-speech is clear moves extremities without difficulty.   Initial meeting with patient.  He states that he does not remember ever being admitted with heart failure in the past.  He states that he lives with his sister and brother-in-law.  Brother-in-law is the one that does the cooking.  He does admit to using some salt at the table.  Discussed removing the saltshaker from the table and to use Mrs. Dash as a seasoning along with pepper and other spices.  Other than ones that have salt in the name.  He voices understanding.  Discussed diet, he states that he does not like to eat at fast food restaurants but does not do a lot, also likes eating pretzels and hot dog.  Discussed making healthier choices.  Discussed weighing daily, have contacted the care  coordinators to get the patient a scale so that he may be able to weigh himself.  He was given the zone magnet, living with heart failure booklet and information about the heart failure clinic along with an appointment.  He had no further question, instructed we will continue to follow along as long as he is in the hospital.   Tresa Endo RN [facility]

## 2020-08-19 ENCOUNTER — Inpatient Hospital Stay: Payer: Medicaid Other

## 2020-08-19 LAB — BASIC METABOLIC PANEL
Anion gap: 8 (ref 5–15)
BUN: 43 mg/dL — ABNORMAL HIGH (ref 6–20)
CO2: 29 mmol/L (ref 22–32)
Calcium: 8.2 mg/dL — ABNORMAL LOW (ref 8.9–10.3)
Chloride: 103 mmol/L (ref 98–111)
Creatinine, Ser: 1.63 mg/dL — ABNORMAL HIGH (ref 0.61–1.24)
GFR, Estimated: 48 mL/min — ABNORMAL LOW (ref >60)
Glucose, Bld: 89 mg/dL (ref 70–99)
Potassium: 3.7 mmol/L (ref 3.5–5.1)
Sodium: 140 mmol/L (ref 135–145)

## 2020-08-19 MED ORDER — LISINOPRIL 5 MG PO TABS
5.0000 mg | ORAL_TABLET | Freq: Every day | ORAL | Status: DC
  Administered 2020-08-20 – 2020-08-25 (×6): 5 mg via ORAL
  Filled 2020-08-19 (×6): qty 1

## 2020-08-19 MED ORDER — POTASSIUM CHLORIDE CRYS ER 20 MEQ PO TBCR
40.0000 meq | EXTENDED_RELEASE_TABLET | Freq: Once | ORAL | Status: AC
  Administered 2020-08-19: 40 meq via ORAL
  Filled 2020-08-19: qty 2

## 2020-08-19 MED ORDER — FUROSEMIDE 10 MG/ML IJ SOLN
60.0000 mg | Freq: Two times a day (BID) | INTRAMUSCULAR | Status: DC
  Administered 2020-08-19 – 2020-08-21 (×5): 60 mg via INTRAVENOUS
  Filled 2020-08-19 (×5): qty 6

## 2020-08-19 MED ORDER — ATORVASTATIN CALCIUM 20 MG PO TABS
20.0000 mg | ORAL_TABLET | Freq: Every day | ORAL | Status: DC
  Administered 2020-08-20 – 2020-08-25 (×6): 20 mg via ORAL
  Filled 2020-08-19 (×6): qty 1

## 2020-08-19 MED ORDER — MAGNESIUM SULFATE 2 GM/50ML IV SOLN
2.0000 g | Freq: Once | INTRAVENOUS | Status: AC
  Administered 2020-08-19: 2 g via INTRAVENOUS
  Filled 2020-08-19: qty 50

## 2020-08-19 NOTE — Evaluation (Signed)
Physical Therapy Evaluation Patient Details Name: Darin Hopkins MRN: [id number] DOB: 05/21/1960 Today's Date: 08/19/2020   History of Present Illness  Patient is a 60 y.o. male with medical history significant for hypertension, history of GERD, history of heart failure exacerbation, history of seizures, last seizure was approximately 2 years ago, CKD 3, hyperlipidemia, COPD, history of CVA about 2.5 years ago, alcohol abuse, tobacco abuse, presents to the emergency department for chief concerns of shortness of breath. Found to have Acute decompensated diastolic heart failure and Acute hypoxic respiratory failure  Clinical Impression  Patient reports he is previously independent with mobility and ADLs without assistive device and does not wear oxygen at baseline.  Patient currently has limited overall activity tolerance and desaturated consistently with standing, once to 86% and to 84% with second standing bout on 4 L02. With rest break in sitting or laying position, Sp02 increased to 91% within 1 minute with cues for breathing techniques. Patient does require assistance for mobility and appears to have generalized weakness throughout. Recommend PT to maximize independence and facilitate return to PLOF. Consider SNF placement at this time for ongoing PT efforts unless patient will have adequate support with mobility for fall prevention at home.     Follow Up Recommendations SNF (depending on progress with functional independence and activity tolerance)    Equipment Recommendations  None recommended by PT    Recommendations for Other Services       Precautions / Restrictions Precautions Precautions: Fall Restrictions Weight Bearing Restrictions: No      Mobility  Bed Mobility Overal bed mobility: Needs Assistance Bed Mobility: Supine to Sit;Sit to Supine     Supine to sit: Min guard Sit to supine: Min guard   General bed mobility comments: extra time required to complete tasks.  verbal cues for safety    Transfers Overall transfer level: Needs assistance Equipment used: Rolling walker (2 wheeled) Transfers: Sit to/from Stand Sit to Stand: Min assist         General transfer comment: lifting assistance required for standing and decreased eccentric control noted. verbal cues for hand placement  Ambulation/Gait Ambulation/Gait assistance: Min guard Gait Distance (Feet): 2 Feet Assistive device: Rolling walker (2 wheeled)   Gait velocity: decreased   General Gait Details: patient took several side steps along edge of bed with Min guard for safety using rolling walker. Further ambulation limited due to fatigue with activity and Sp02 84% in standing while on 4 L02.  Stairs            Wheelchair Mobility    Modified Rankin (Stroke Patients Only)       Balance Overall balance assessment: Needs assistance;History of Falls Sitting-balance support: Feet supported Sitting balance-Leahy Scale: Fair Sitting balance - Comments: patient intermittent relying on BUE for support in sitting   Standing balance support: Bilateral upper extremity supported Standing balance-Leahy Scale: Fair Standing balance comment: patient using rolling walker for support in standing                             Pertinent Vitals/Pain Pain Assessment: No/denies pain    Home Living Family/patient expects to be discharged to:: Private residence Living Arrangements:  (sister and brother-in-law) Available Help at Discharge: Family;Available PRN/intermittently Type of Home: Mobile home Home Access: Stairs to enter Entrance Stairs-Rails: Can reach both Entrance Stairs-Number of Steps: 2 Home Layout: One level Home Equipment: Walker - 2 wheels      Prior  Function Level of Independence: Independent         Comments: patient reports he is independent with mobility and ADLs. Patient does not drive (sister drives) and brother in law does the cooking in the home.  He also reported 2-3 falls in the past 6 months     Hand Dominance   Dominant Hand: Right    Extremity/Trunk Assessment   Upper Extremity Assessment Upper Extremity Assessment: Generalized weakness    Lower Extremity Assessment Lower Extremity Assessment: Generalized weakness (strength grossly 4/5 for knee extension, dorsiflexion, and plantarflexion bilaterally)       Communication   Communication: No difficulties  Cognition Arousal/Alertness: Awake/alert Behavior During Therapy: WFL for tasks assessed/performed Overall Cognitive Status: Within Functional Limits for tasks assessed                                 General Comments: decreased insight into physical deficits/limitations      General Comments      Exercises     Assessment/Plan    PT Assessment Patient needs continued PT services  PT Problem List Decreased strength;Decreased activity tolerance;Decreased balance;Decreased mobility;Decreased knowledge of use of DME;Decreased safety awareness;Cardiopulmonary status limiting activity       PT Treatment Interventions DME instruction;Gait training;Stair training;Functional mobility training;Therapeutic activities;Therapeutic exercise;Balance training;Neuromuscular re-education;Patient/family education    PT Goals (Current goals can be found in the Care Plan section)  Acute Rehab PT Goals Patient Stated Goal: to go home PT Goal Formulation: With patient Time For Goal Achievement: 09/02/20 Potential to Achieve Goals: Fair    Frequency Min 2X/week   Barriers to discharge        Co-evaluation               AM-PAC PT "6 Clicks" Mobility  Outcome Measure Help needed turning from your back to your side while in a flat bed without using bedrails?: A Little Help needed moving from lying on your back to sitting on the side of a flat bed without using bedrails?: A Little Help needed moving to and from a bed to a chair (including a  wheelchair)?: A Little Help needed standing up from a chair using your arms (e.g., wheelchair or bedside chair)?: A Little Help needed to walk in hospital room?: A Lot Help needed climbing 3-5 steps with a railing? : A Lot 6 Click Score: 16    End of Session Equipment Utilized During Treatment: Oxygen Activity Tolerance: Patient limited by fatigue Patient left: in bed;with call bell/phone within reach;with bed alarm set;with SCD's reapplied Nurse Communication: Mobility status PT Visit Diagnosis: Unsteadiness on feet (R26.81);Muscle weakness (generalized) (M62.81);History of falling (Z91.81)    Time: 8469-6295 PT Time Calculation (min) (ACUTE ONLY): 24 min   Charges:   PT Evaluation $PT Eval Moderate Complexity: 1 Mod PT Treatments $Therapeutic Activity: 8-22 mins        Donna Bernard, PT, MPT   Ina Homes 08/19/2020, 10:21 AM

## 2020-08-19 NOTE — Evaluation (Signed)
Clinical/Bedside Swallow Evaluation Patient Details  Name: Darin Hopkins MRN: [id number] Date of Birth: [birth year]  Today's Date: 08/19/2020 Time: SLP Start Time (ACUTE ONLY): 1106 SLP Stop Time (ACUTE ONLY): 1126 SLP Time Calculation (min) (ACUTE ONLY): 20 min  Past Medical History:  Past Medical History:  Diagnosis Date  . COPD (chronic obstructive pulmonary disease) (HCC)   . Hypertension    Past Surgical History: History reviewed. No pertinent surgical history. HPI:  Patient is a 60 y.o. male with medical history significant for hypertension, history of GERD, history of heart failure exacerbation, history of seizures, last seizure was approximately 2 years ago, CKD 3, hyperlipidemia, COPD, history of CVA about 2.5 years ago, alcohol abuse, tobacco abuse, presents to the emergency department for chief concerns of shortness of breath. Found to have Acute decompensated diastolic heart failure and Acute hypoxic respiratory failure   Assessment / Plan / Recommendation Clinical Impression  Pt demonstrated adequate oropharyngeal abilities when consuming regular, puree and thin liquids via cup and straw. Pt's vitals remained stable and his voice remained clear throughout. Extensive educaiton provided on aspiration precuations to adhere to at home. current diet appears appropriate at this time. Skilled intervention is not indicated at this time. SLP Visit Diagnosis: Dysphagia, unspecified (R13.10)    Aspiration Risk  No limitations    Diet Recommendation Regular;Thin liquid   Liquid Administration via: Cup;Straw Medication Administration: Whole meds with liquid Supervision: Patient able to self feed Compensations: Minimize environmental distractions;Slow rate;Small sips/bites Postural Changes: Seated upright at 90 degrees;Remain upright for at least 30 minutes after po intake    Other  Recommendations Oral Care Recommendations: Oral care BID   Follow up Recommendations None       Frequency and Duration   N/A         Prognosis   N/A     Swallow Study   General Date of Onset: 08/17/20 HPI: Patient is a 60 y.o. male with medical history significant for hypertension, history of GERD, history of heart failure exacerbation, history of seizures, last seizure was approximately 2 years ago, CKD 3, hyperlipidemia, COPD, history of CVA about 2.5 years ago, alcohol abuse, tobacco abuse, presents to the emergency department for chief concerns of shortness of breath. Found to have Acute decompensated diastolic heart failure and Acute hypoxic respiratory failure Type of Study: Bedside Swallow Evaluation Previous Swallow Assessment: none in chart Diet Prior to this Study: Regular;Thin liquids Temperature Spikes Noted: No Respiratory Status: Room air History of Recent Intubation: No Behavior/Cognition: Alert;Cooperative;Pleasant mood Oral Cavity Assessment: Within Functional Limits Oral Care Completed by SLP: No Oral Cavity - Dentition: Adequate natural dentition Vision: Functional for self-feeding Self-Feeding Abilities: Able to feed self Patient Positioning: Upright in bed Baseline Vocal Quality: Normal Volitional Cough: Strong Volitional Swallow: Able to elicit    Oral/Motor/Sensory Function Overall Oral Motor/Sensory Function: Within functional limits   Ice Chips Ice chips: Not tested   Thin Liquid Thin Liquid: Within functional limits Presentation: Self Fed;Cup;Straw    Nectar Thick Nectar Thick Liquid: Not tested   Honey Thick Honey Thick Liquid: Not tested   Puree Puree: Within functional limits Presentation: Self Fed;Spoon   Solid    [name] B. Dreama Saa, M.S., CCC-SLP, [facility] Speech-Language Pathologist Rehabilitation Services Office [phone]  Solid: Within functional limits Presentation: Self Fed      [name] 08/19/2020,4:33 PM

## 2020-08-19 NOTE — Evaluation (Signed)
Occupational Therapy Evaluation Patient Details Name: Darin Hopkins MRN: [id number] DOB: 06/04/1960 Today's Date: 08/19/2020    History of Present Illness Patient is a 60 y.o. male with medical history significant for hypertension, history of GERD, history of heart failure exacerbation, history of seizures, last seizure was approximately 2 years ago, CKD 3, hyperlipidemia, COPD, history of CVA about 2.5 years ago, alcohol abuse, tobacco abuse, presents to the emergency department for chief concerns of shortness of breath. Found to have Acute decompensated diastolic heart failure and Acute hypoxic respiratory failure   Clinical Impression   Pt seen for OT evaluation on this date. Upon arrival to room, pt awake and seated upright in recliner. Pt A&Ox3 (oriented to self, place, and month/year) and reporting no pain. Pt agreeable to OT eval/tx. Patient reports that at baseline he is independent with mobility and ADLs (spongebathing at baseline). Patient does not drive (sister drives) and brother in law does the cooking in the home. Pt endorses at least 2 falls in the past 6 months. Pt currently presents with decreased balance, strength, and activity tolerance and requires MIN GUARD for bed mobility, transfers, functional mobility of short household distances, and standing grooming tasks. At beginning of session, pt on 2L O2 via [place] with SpO2 96%. Upon first bout of standing, pt complained of dizziness. Pt seated and orthostatics obtained: BP sitting 104/70, BP standing 110/78, SpO2 standing 92%. Pt motivated to participate in progressive functional mobility and able to walk to/from room sink (~12 feet), however SpO2 desat 81% following functional mobility. SpO2 able to recover to 92% following 1-min of seated rest break and PLB. Pt left in recliner, with SpO2>92%, and pt in no acute distress; RN informed. Pt would benefit from additional skilled OT services to maximize return to PLOF and minimize risk of  future falls, injury, caregiver burden, and readmission. Upon discharge, recommend SNF.       Follow Up Recommendations  SNF    Equipment Recommendations  Other (comment) (defer to next venue of care)       Precautions / Restrictions Precautions Precautions: Fall Restrictions Weight Bearing Restrictions: No      Mobility Bed Mobility Overal bed mobility: Needs Assistance Bed Mobility: Supine to Sit     Supine to sit: Min guard     General bed mobility comments: Requires verbal cues for safety    Transfers Overall transfer level: Needs assistance Equipment used: Rolling walker (2 wheeled) Transfers: Sit to/from Stand Sit to Stand: Min guard              Balance Overall balance assessment: Needs assistance;History of Falls Sitting-balance support: No upper extremity supported;Feet supported Sitting balance-Leahy Scale: Fair Sitting balance - Comments: fair seated balance at EOB   Standing balance support: No upper extremity supported;During functional activity Standing balance-Leahy Scale: Fair Standing balance comment: MIN GUARD to walk ~12 feet without AD, however seeking external support while walking around room.                           ADL either performed or assessed with clinical judgement   ADL Overall ADL's : Needs assistance/impaired     Grooming: Wash/dry hands;Min guard;Standing                               Functional mobility during ADLs: Min guard (MIN GUARD to walk ~12 feet without AD, however seeking external  support while walking around room.)       Vision Baseline Vision/History: Wears glasses (Pt reports needing to use glasses but unable to afford them) Patient Visual Report: No change from baseline              Pertinent Vitals/Pain Pain Assessment: No/denies pain     Hand Dominance Right   Extremity/Trunk Assessment Upper Extremity Assessment Upper Extremity Assessment: Generalized weakness    Lower Extremity Assessment Lower Extremity Assessment: Generalized weakness       Communication Communication Communication: No difficulties   Cognition Arousal/Alertness: Awake/alert Behavior During Therapy: WFL for tasks assessed/performed Overall Cognitive Status: No family/caregiver present to determine baseline cognitive functioning                                 General Comments: A&Ox3 (self, place, month/year). Decreased insight into deficits. Requires increased processing time and has difficultly following multi-step commands   General Comments  Upon arrival, pt on 2L O2 via [place] with SpO2 96%. Upon standing, pt complained of dizziness. Pt seated and orthostatics obtained: BP sitting 104/70, BP standing 110/78, SpO2 standing 92%. Pt motivated to participate in progressive functional mobility and able to walk to/from room sink (~12 feet), however SpO2 desat 81% following functional mobility. SpO2 able to recover following 1-min of seated rest break and PLB. Pt left in recliner, with SpO2>92%, and pt in no acute distress; RN informed    Exercises Other Exercises Other Exercises: Educated pt on role of OT and POC        Home Living Family/patient expects to be discharged to:: Private residence Living Arrangements: Other relatives (sister and brother-in-law) Available Help at Discharge: Family;Available PRN/intermittently Type of Home: Mobile home Home Access: Stairs to enter Entrance Stairs-Number of Steps: 2 Entrance Stairs-Rails: Can reach both Home Layout: One level     Bathroom Shower/Tub: Other (comment) (patient reports he only performs sponge bath while bathing standing at sink)         Home Equipment: Walker - 2 wheels          Prior Functioning/Environment Level of Independence: Independent        Comments: patient reports he is independent with mobility and ADLs. Patient does not drive (sister drives) and brother in law does the cooking  in the home. He also reported 2-3 falls in the past 6 months        OT Problem List: Decreased strength;Decreased activity tolerance;Impaired balance (sitting and/or standing);Impaired vision/perception;Decreased safety awareness      OT Treatment/Interventions: Self-care/ADL training;Therapeutic exercise;Energy conservation;DME and/or AE instruction;Therapeutic activities;Patient/family education;Balance training    OT Goals(Current goals can be found in the care plan section) Acute Rehab OT Goals Patient Stated Goal: to go home OT Goal Formulation: With patient Time For Goal Achievement: 09/02/20 Potential to Achieve Goals: Fair ADL Goals Pt Will Perform Grooming: with modified independence;standing Pt Will Perform Lower Body Dressing: with modified independence;sit to/from stand Pt Will Perform Toileting - Clothing Manipulation and hygiene: with modified independence;sitting/lateral leans  OT Frequency: Min 1X/week    AM-PAC OT "6 Clicks" Daily Activity     Outcome Measure Help from another person eating meals?: None Help from another person taking care of personal grooming?: A Little Help from another person toileting, which includes using toliet, bedpan, or urinal?: A Little Help from another person bathing (including washing, rinsing, drying)?: A Lot Help from another person to put on and taking  off regular upper body clothing?: A Little Help from another person to put on and taking off regular lower body clothing?: A Little 6 Click Score: 18   End of Session Equipment Utilized During Treatment: Gait belt;Oxygen Nurse Communication: Mobility status;Other (comment) (SpO2)  Activity Tolerance: Patient tolerated treatment well Patient left: in chair;with call bell/phone within reach;with chair alarm set  OT Visit Diagnosis: Muscle weakness (generalized) (M62.81);History of falling (Z91.81)                Time: 1610-9604 OT Time Calculation (min): 33 min Charges:  OT  General Charges $OT Visit: 1 Visit OT Evaluation $OT Eval Moderate Complexity: 1 Mod OT Treatments $Self Care/Home Management : 8-22 mins  Matthew Folks, OTR/L [facility] [phone]

## 2020-08-19 NOTE — Progress Notes (Signed)
PROGRESS NOTE    Darin Hopkins  WUJ:[id number] DOB: 25-Jun-1960 DOA: 08/17/2020 PCP: Martie Round, NP   Brief Narrative:   60 y.o. male with medical history significant for hypertension, history of GERD, history of heart failure exacerbation, history of seizures, last seizure was approximately 2 years ago, CKD 3, hyperlipidemia, COPD, history of CVA about 2.5 years ago, alcohol abuse, tobacco abuse, presents to the emergency department for chief concerns of shortness of breath.  Patient reports that he has been having shortness of breath the last 3 to 4 days.  He states that shortness of breath is worse with exertion.  He also endorses bilateral lower extremity edema and swelling.  He denies sick contacts.  He states that he also has a new cough that started about 3 to 4 days ago.  He states that the cough is nonproductive.  He denies fever.  He endorses chills and denies nausea and vomiting, chest pain, abdominal pain, dysuria, diarrhea.   He endorses bilateral lower extremity swelling with pitting edema for 3 months.   Patient states he is compliant with medication though he has not seen a doctor in 2 years. He states the medications continue to be prescribe for him.   BNP elevated at 1306.  Interestingly TTE performed demonstrates normal ejection fraction and normal diastolic parameters.   Patient was weaned from BiPAP and had been titrated to 2 L.  As of 4/12 oxygen demand increased to 4 L.  Chest x-ray showing airspace infiltrates bilaterally suspicious for pulmonary edema.  Assessment & Plan:   Principal Problem:   Acute exacerbation of CHF (congestive heart failure) (HCC) Active Problems:   Essential hypertension   History of seizure   COPD (chronic obstructive pulmonary disease) (HCC)   Hyperlipidemia   GERD (gastroesophageal reflux disease)   Alcohol abuse   Acute decompensated heart failure (HCC)  Acute decompensated diastolic heart failure Acute hypoxic respiratory  failure secondary to above Patient had elevated BNP of 1306 Clinical signs of fluid overload including pulmonary edema and lower extremity edema Was requiring BiPAP, now weaned off Was on 2 L after BiPAP wean, now on 4 Inadequate diuresis on 40 mg Lasix IV twice daily No appreciable weight loss on Daily weights but question accuracy Plan: Check chest x-ray, increasing infiltrates bilaterally with associated effusion Increase diuresis IV Lasix 60 mg twice daily Target net -1-1.5 L daily Daily weights, standing if able Strict I's and O's Continue ACE inhibitor Consider inpatient cardiology evaluation if patient not diuresing appropriately Can also consider metolazone addition  Elevated troponin Suspect supply demand ischemia in the setting of heart failure exacerbation Has been in normal sinus rhythm Can discontinue telemetry monitoring for now  History of seizures last seizure was about 2 years ago, patient did not take any of his seizure medication this a.m. before EMT arrived -Resumed Keppra 500 mg twice daily -Seizure precautions  Hypertension patient reports compliance with his medication except for day of presentation -Lisinopril-hydrochlorothiazide 20-25 mg daily, hydralazine 50 mg 3 times daily, amlodipine 5 mg daily at home -Currently normotensive at this time, I have resumed lisinopril 20 mg daily and amlodipine 5 mg daily -Added furosemide 60 mg IV BID starting 08/19/20  COPD -Symbicort 160-4.5-4.5 mcg inhaler twice daily -Does not appear patient is in exacerbation -Symbicort is not on formulary, I have replaced with Dulera  Alcohol abuse  drinks 1/5 of liquor per day per sister, CIWA protocal initiated No evidence of clinical withdrawals at this time Educated extensively on alcohol  cessation  Tobacco abuse - nicotine patch Hyperlipidemia-atorvastatin 20 mg daily GERD-omeprazole 20 mg daily History of CVA - resumed asa 81 mg daily and atorvastatin 20 mg  daily    DVT prophylaxis: SQ Lovenox Code Status: Full Family Communication: Sister [name] [phone] on 4/11.  Disposition Plan: Status is: Inpatient  Remains inpatient appropriate because:Inpatient level of care appropriate due to severity of illness   Dispo: The patient is from: Home              Anticipated d/c is to: Home              Patient currently is not medically stable to d/c.   Difficult to place patient No  Fluid overload, suspect transient diastolic heart failure.  Possible hypertensive urgency and flash pulmonary edema.     Level of care: Progressive Cardiac  Consultants:   None   Procedures: None  Antimicrobials:   None   Subjective: Patient seen and examined.  Endorses improvement in shortness of breath.  Lower extremity edema also improving.  No pain complaints.  Objective: Vitals:   08/18/20 1603 08/18/20 2041 08/19/20 0110 08/19/20 0500  BP: 113/77 110/73 117/81 117/82  Pulse: 60 74 73 65  Resp: 18 16 16 16   Temp: (!) 97.4 F (36.3 C) 98 F (36.7 C) 97.8 F (36.6 C) 97.9 F (36.6 C)  TempSrc: Oral Oral Oral Oral  SpO2: (!) 88% 93% 96% 94%  Weight:      Height:        Intake/Output Summary (Last 24 hours) at 08/19/2020 1234 Last data filed at 08/19/2020 0945 Gross per 24 hour  Intake 960 ml  Output 1600 ml  Net -640 ml   Filed Weights   08/17/20 1338 08/18/20 0500 08/18/20 1457  Weight: 63.2 kg 63.1 kg 63.7 kg    Examination:  General exam: Appears calm and comfortable  Respiratory system: Bibasilar crackles.  Normal work of breathing.  4 L Cardiovascular system: S1 & S2 heard, RRR. No JVD, murmurs, rubs, gallops or clicks.  2+ pitting edema bilateral lower extremities Gastrointestinal system: Abdomen is nondistended, soft and nontender. No organomegaly or masses felt. Normal bowel sounds heard. Central nervous system: Alert and oriented. No focal neurological deficits. Extremities: Symmetric 5 x 5 power. Skin: No  rashes, lesions or ulcers Psychiatry: Judgement and insight appear normal. Mood & affect appropriate.     Data Reviewed: I have personally reviewed following labs and imaging studies  CBC: Recent Labs  Lab 08/17/20 1037 08/18/20 0329  WBC 9.9 7.1  NEUTROABS 4.2 3.0  HGB 13.3 12.9*  HCT 44.3 41.1  MCV 103.5* 100.5*  PLT 204 213   Basic Metabolic Panel: Recent Labs  Lab 08/17/20 1037 08/17/20 1418 08/18/20 0329 08/19/20 0415  NA 146*  --  143 140  K 4.8  --  4.7 3.7  CL 112*  --  107 103  CO2 27  --  30 29  GLUCOSE 103*  --  93 89  BUN 41*  --  43* 43*  CREATININE 1.54*  --  1.73* 1.63*  CALCIUM 8.4*  --  8.4* 8.2*  MG  --  1.9  --   --   PHOS  --  3.6  --   --    GFR: Estimated Creatinine Clearance: 44 mL/min (A) (by C-G formula based on SCr of 1.63 mg/dL (H)). Liver Function Tests: Recent Labs  Lab 08/17/20 1037  AST 22  ALT 14  ALKPHOS 40  BILITOT 0.6  PROT 6.9  ALBUMIN 3.6   No results for input(s): LIPASE, AMYLASE in the last 168 hours. No results for input(s): AMMONIA in the last 168 hours. Coagulation Profile: No results for input(s): INR, PROTIME in the last 168 hours. Cardiac Enzymes: No results for input(s): CKTOTAL, CKMB, CKMBINDEX, TROPONINI in the last 168 hours. BNP (last 3 results) No results for input(s): PROBNP in the last 8760 hours. HbA1C: No results for input(s): HGBA1C in the last 72 hours. CBG: Recent Labs  Lab 08/17/20 1342  GLUCAP 110*   Lipid Profile: No results for input(s): CHOL, HDL, LDLCALC, TRIG, CHOLHDL, LDLDIRECT in the last 72 hours. Thyroid Function Tests: Recent Labs    08/17/20 1418  TSH 2.303   Anemia Panel: No results for input(s): VITAMINB12, FOLATE, FERRITIN, TIBC, IRON, RETICCTPCT in the last 72 hours. Sepsis Labs: No results for input(s): PROCALCITON, LATICACIDVEN in the last 168 hours.  Recent Results (from the past 240 hour(s))  Resp Panel by RT-PCR (Flu A&B, Covid) Nasopharyngeal Swab      Status: None   Collection Time: 08/17/20 10:38 AM   Specimen: Nasopharyngeal Swab; Nasopharyngeal(NP) swabs in vial transport medium  Result Value Ref Range Status   SARS Coronavirus 2 by RT PCR NEGATIVE NEGATIVE Final    Comment: (NOTE) SARS-CoV-2 target nucleic acids are NOT DETECTED.  The SARS-CoV-2 RNA is generally detectable in upper respiratory specimens during the acute phase of infection. The lowest concentration of SARS-CoV-2 viral copies this assay can detect is 138 copies/mL. A negative result does not preclude SARS-Cov-2 infection and should not be used as the sole basis for treatment or other patient management decisions. A negative result may occur with  improper specimen collection/handling, submission of specimen other than nasopharyngeal swab, presence of viral mutation(s) within the areas targeted by this assay, and inadequate number of viral copies(<138 copies/mL). A negative result must be combined with clinical observations, patient history, and epidemiological information. The expected result is Negative.  Fact Sheet for Patients:  BloggerCourse.com  Fact Sheet for Healthcare Providers:  SeriousBroker.it  This test is no t yet approved or cleared by the [place] FDA and  has been authorized for detection and/or diagnosis of SARS-CoV-2 by FDA under an Emergency Use Authorization (EUA). This EUA will remain  in effect (meaning this test can be used) for the duration of the COVID-19 declaration under Section 564(b)(1) of the Act, 21 U.S.C.section 360bbb-3(b)(1), unless the authorization is terminated  or revoked sooner.       Influenza A by PCR NEGATIVE NEGATIVE Final   Influenza B by PCR NEGATIVE NEGATIVE Final    Comment: (NOTE) The Xpert Xpress SARS-CoV-2/FLU/RSV plus assay is intended as an aid in the diagnosis of influenza from Nasopharyngeal swab specimens and should not be used as a sole basis  for treatment. Nasal washings and aspirates are unacceptable for Xpert Xpress SARS-CoV-2/FLU/RSV testing.  Fact Sheet for Patients: BloggerCourse.com  Fact Sheet for Healthcare Providers: SeriousBroker.it  This test is not yet approved or cleared by the [place] FDA and has been authorized for detection and/or diagnosis of SARS-CoV-2 by FDA under an Emergency Use Authorization (EUA). This EUA will remain in effect (meaning this test can be used) for the duration of the COVID-19 declaration under Section 564(b)(1) of the Act, 21 U.S.C. section 360bbb-3(b)(1), unless the authorization is terminated or revoked.  Performed at [facility], [address] [place], [place] [address]   MRSA PCR Screening     Status:  None   Collection Time: 08/17/20  1:45 PM   Specimen: Nasal Mucosa; Nasopharyngeal  Result Value Ref Range Status   MRSA by PCR NEGATIVE NEGATIVE Final    Comment:        The GeneXpert MRSA Assay (FDA approved for NASAL specimens only), is one component of a comprehensive MRSA colonization surveillance program. It is not intended to diagnose MRSA infection nor to guide or monitor treatment for MRSA infections. Performed at [facility], [address] [place], [place] [address]          Radiology Studies: DG Chest [place] 1 View  Result Date: 08/19/2020 CLINICAL DATA:  Respiratory failure EXAM: PORTABLE CHEST 1 VIEW COMPARISON:  08/17/2020 FINDINGS: Cardiac shadow is enlarged but stable somewhat accentuated by the frontal technique. Increasing bibasilar opacity is noted consistent with focal infiltrate and small effusion. IMPRESSION: Increasing bibasilar airspace opacities with associated effusions. Electronically Signed   By: Alcide CleverMark  Lukens M.D.   On: 08/19/2020 10:48   ECHOCARDIOGRAM COMPLETE  Result Date: 08/18/2020    ECHOCARDIOGRAM REPORT   Patient Name:   Darin BossWILLIE L Severs Date of Exam:  08/18/2020 Medical Rec #:  [id number]       Height:       67.0 in Accession #:    [id number]      Weight:       139.1 lb Date of Birth:  [birth year]       BSA:          1.733 m Patient Age:    59 years        BP:           109/79 mmHg Patient Gender: M               HR:           63 bpm. Exam Location:  [facility] Procedure: 2D Echo, Cardiac Doppler, Color Doppler and Strain Analysis Indications:     Dyspnea R06.00  History:         Patient has no prior history of Echocardiogram examinations.                  COPD; Risk Factors:Hypertension.  Sonographer:     Cristela BlueJerry Hege RDCS (AE) Referring Phys:  [id number] AMY N COX Diagnosing Phys: Yvonne Kendallhristopher End MD  Sonographer Comments: Global longitudinal strain was attempted. IMPRESSIONS  1. Left ventricular ejection fraction, by estimation, is 60 to 65%. The left ventricle has normal function. The left ventricle has no regional wall motion abnormalities. There is mild left ventricular hypertrophy. Left ventricular diastolic parameters were normal. The average left ventricular global longitudinal strain is -15.1 %. The global longitudinal strain is abnormal.  2. Right ventricular systolic function is normal. The right ventricular size is normal. There is normal pulmonary artery systolic pressure.  3. Left atrial size was mildly dilated.  4. Right atrial size was moderately dilated.  5. The mitral valve is normal in structure. Trivial mitral valve regurgitation. No evidence of mitral stenosis.  6. The aortic valve is bicuspid. There is mild calcification of the aortic valve. There is moderate thickening of the aortic valve. Aortic valve regurgitation is mild to moderate. Moderate aortic valve stenosis.  7. Aortic dilatation noted. There is mild dilatation of the aortic root, measuring 41 mm. There is mild dilatation of the ascending aorta, measuring 40 mm.  8. The inferior vena cava is normal in size with greater than 50% respiratory variability, suggesting right atrial pressure of 3  mmHg. FINDINGS  Left Ventricle: Left ventricular ejection fraction, by estimation, is 60 to 65%. The left ventricle has normal function. The left ventricle has no regional wall motion abnormalities. The average left ventricular global longitudinal strain is -15.1 %. The global longitudinal strain is abnormal. The left ventricular internal cavity size was normal in size. There is mild left ventricular hypertrophy. Left ventricular diastolic parameters were normal. Right Ventricle: The right ventricular size is normal. No increase in right ventricular wall thickness. Right ventricular systolic function is normal. There is normal pulmonary artery systolic pressure. The tricuspid regurgitant velocity is 2.16 m/s, and  with an assumed right atrial pressure of 3 mmHg, the estimated right ventricular systolic pressure is 21.7 mmHg. Left Atrium: Left atrial size was mildly dilated. Right Atrium: Right atrial size was moderately dilated. Pericardium: There is no evidence of pericardial effusion. Mitral Valve: The mitral valve is normal in structure. Trivial mitral valve regurgitation. No evidence of mitral valve stenosis. Tricuspid Valve: The tricuspid valve is normal in structure. Tricuspid valve regurgitation is trivial. Aortic Valve: The aortic valve is bicuspid. There is mild calcification of the aortic valve. There is moderate thickening of the aortic valve. Aortic valve regurgitation is mild to moderate. Aortic regurgitation PHT measures 475 msec. Moderate aortic stenosis is present. Aortic valve mean gradient measures 18.0 mmHg. Aortic valve peak gradient measures 30.0 mmHg. Aortic valve area, by VTI measures 1.31 cm. Pulmonic Valve: The pulmonic valve was not well visualized. Pulmonic valve regurgitation is not visualized. No evidence of pulmonic stenosis. Aorta: Aortic dilatation noted. There is mild dilatation of the aortic root, measuring 41 mm. There is mild dilatation of the ascending aorta, measuring 40 mm.  Pulmonary Artery: The pulmonary artery is not well seen. Venous: The inferior vena cava is normal in size with greater than 50% respiratory variability, suggesting right atrial pressure of 3 mmHg. IAS/Shunts: The interatrial septum was not well visualized.  LEFT VENTRICLE PLAX 2D LVIDd:         5.24 cm  Diastology LVIDs:         3.07 cm  LV e' medial:    9.90 cm/s LV PW:         1.42 cm  LV E/e' medial:  11.2 LV IVS:        0.90 cm  LV e' lateral:   11.40 cm/s LVOT diam:     2.20 cm  LV E/e' lateral: 9.7 LV SV:         69 LV SV Index:   40       2D Longitudinal Strain LVOT Area:     3.80 cm 2D Strain GLS Avg:     -15.1 %  RIGHT VENTRICLE RV Basal diam:  3.77 cm RV S prime:     13.40 cm/s TAPSE (M-mode): 2.2 cm LEFT ATRIUM             Index       RIGHT ATRIUM           Index LA diam:        3.70 cm 2.13 cm/m  RA Area:     25.20 cm LA Vol (A2C):   73.3 ml 42.29 ml/m RA Volume:   91.70 ml  52.91 ml/m LA Vol (A4C):   63.9 ml 36.87 ml/m LA Biplane Vol: 70.6 ml 40.74 ml/m  AORTIC VALVE                    PULMONIC VALVE AV Area (Vmax):  1.15 cm     PV Vmax:        0.60 m/s AV Area (Vmean):   1.12 cm     PV Peak grad:   1.4 mmHg AV Area (VTI):     1.31 cm     RVOT Peak grad: 2 mmHg AV Vmax:           274.00 cm/s AV Vmean:          197.000 cm/s AV VTI:            0.525 m AV Peak Grad:      30.0 mmHg AV Mean Grad:      18.0 mmHg LVOT Vmax:         82.90 cm/s LVOT Vmean:        58.100 cm/s LVOT VTI:          0.181 m LVOT/AV VTI ratio: 0.34 AI PHT:            475 msec  AORTA Ao Root diam: 3.83 cm MITRAL VALVE                TRICUSPID VALVE MV Area (PHT): 3.26 cm     TR Peak grad:   18.7 mmHg MV Decel Time: 233 msec     TR Vmax:        216.00 cm/s MV E velocity: 111.00 cm/s MV A velocity: 111.00 cm/s  SHUNTS MV E/A ratio:  1.00         Systemic VTI:  0.18 m                             Systemic Diam: 2.20 cm Yvonne Kendall MD Electronically signed by Yvonne Kendall MD Signature Date/Time: 08/18/2020/1:04:14 PM     Final         Scheduled Meds: . amLODipine  5 mg Oral QHS  . aspirin EC  81 mg Oral Daily  . Chlorhexidine Gluconate Cloth  6 each Topical Q2200  . enoxaparin (LOVENOX) injection  40 mg Subcutaneous Q24H  . folic acid  1 mg Oral Daily  . furosemide  60 mg Intravenous Q12H  . Ipratropium-Albuterol  1 puff Inhalation QID  . levETIRAcetam  500 mg Oral BID  . lisinopril  20 mg Oral Daily  . mometasone-formoterol  2 puff Inhalation BID  . multivitamin with minerals  1 tablet Oral Daily  . pantoprazole  40 mg Oral Daily  . sodium chloride flush  3 mL Intravenous Q12H  . thiamine  100 mg Oral Daily   Or  . thiamine  100 mg Intravenous Daily   Continuous Infusions: . sodium chloride       LOS: 1 day    Time spent: 25 minutes    Tresa Moore, MD [facility] Pager [phone]  If 7PM-7AM, please contact night-coverage 08/19/2020, 12:34 PM

## 2020-08-19 NOTE — Plan of Care (Signed)
Per tele report - patient has maintain in NSR. Per verbal/chat, Dr. York Spaniel ok to remove.

## 2020-08-19 NOTE — Progress Notes (Addendum)
   Heart Failure Nurse Navigator Note  HF60-65%.  Met with patient today, he was lying quietly in bed on O2 at 2 L satting 86 and 87%.  He denied feeling short of breath. Moved probe to another finger to see if sats improved.  During interview his nurse came in and titrated up on his oxygen level.  Then began satting 93%-94%.   He did not remember anything that we talked about yesterday as far as diet, daily weights etc. he is oriented x3 Offered the heart failure videos and he was willing to look at them but then the occupational therapist came into work with him.  Instructed patient we will try for another time.  Pricilla Riffle RN [facility]

## 2020-08-20 ENCOUNTER — Inpatient Hospital Stay: Payer: Medicaid Other

## 2020-08-20 DIAGNOSIS — J9602 Acute respiratory failure with hypercapnia: Secondary | ICD-10-CM

## 2020-08-20 DIAGNOSIS — I509 Heart failure, unspecified: Secondary | ICD-10-CM

## 2020-08-20 DIAGNOSIS — J9601 Acute respiratory failure with hypoxia: Secondary | ICD-10-CM

## 2020-08-20 LAB — MAGNESIUM: Magnesium: 1.8 mg/dL (ref 1.7–2.4)

## 2020-08-20 LAB — BASIC METABOLIC PANEL
Anion gap: 5 (ref 5–15)
BUN: 37 mg/dL — ABNORMAL HIGH (ref 6–20)
CO2: 32 mmol/L (ref 22–32)
Calcium: 8.3 mg/dL — ABNORMAL LOW (ref 8.9–10.3)
Chloride: 104 mmol/L (ref 98–111)
Creatinine, Ser: 1.52 mg/dL — ABNORMAL HIGH (ref 0.61–1.24)
GFR, Estimated: 52 mL/min — ABNORMAL LOW (ref >60)
Glucose, Bld: 102 mg/dL — ABNORMAL HIGH (ref 70–99)
Potassium: 4.7 mmol/L (ref 3.5–5.1)
Sodium: 141 mmol/L (ref 135–145)

## 2020-08-20 LAB — PROCALCITONIN: Procalcitonin: <0.1 ng/mL

## 2020-08-20 MED ORDER — METOLAZONE 5 MG PO TABS
5.0000 mg | ORAL_TABLET | Freq: Every day | ORAL | Status: DC
  Administered 2020-08-20 – 2020-08-21 (×2): 5 mg via ORAL
  Filled 2020-08-20 (×3): qty 1

## 2020-08-20 NOTE — Plan of Care (Signed)
  Problem: Education: Goal: Knowledge of General Education information will improve Description: Including pain rating scale, medication(s)/side effects and non-pharmacologic comfort measures 08/20/2020 1347 by Ansel Bong, RN Outcome: Progressing 08/20/2020 1347 by Ansel Bong, RN Outcome: Progressing   Problem: Health Behavior/Discharge Planning: Goal: Ability to manage health-related needs will improve 08/20/2020 1347 by Ansel Bong, RN Outcome: Progressing 08/20/2020 1347 by Ansel Bong, RN Outcome: Progressing   Problem: Clinical Measurements: Goal: Ability to maintain clinical measurements within normal limits will improve 08/20/2020 1347 by Ansel Bong, RN Outcome: Progressing 08/20/2020 1347 by Ansel Bong, RN Outcome: Progressing Goal: Will remain free from infection 08/20/2020 1347 by Ansel Bong, RN Outcome: Progressing 08/20/2020 1347 by Ansel Bong, RN Outcome: Progressing Goal: Diagnostic test results will improve 08/20/2020 1347 by Ansel Bong, RN Outcome: Progressing 08/20/2020 1347 by Ansel Bong, RN Outcome: Progressing Goal: Respiratory complications will improve 08/20/2020 1347 by Ansel Bong, RN Outcome: Progressing 08/20/2020 1347 by Ansel Bong, RN Outcome: Progressing Goal: Cardiovascular complication will be avoided 08/20/2020 1347 by Ansel Bong, RN Outcome: Progressing 08/20/2020 1347 by Ansel Bong, RN Outcome: Progressing   Problem: Activity: Goal: Risk for activity intolerance will decrease 08/20/2020 1347 by Ansel Bong, RN Outcome: Progressing 08/20/2020 1347 by Ansel Bong, RN Outcome: Progressing   Problem: Nutrition: Goal: Adequate nutrition will be maintained 08/20/2020 1347 by Ansel Bong, RN Outcome: Progressing 08/20/2020 1347 by Ansel Bong, RN Outcome: Progressing   Problem: Coping: Goal: Level of anxiety will decrease 08/20/2020 1347 by Ansel Bong, RN Outcome:  Progressing 08/20/2020 1347 by Ansel Bong, RN Outcome: Progressing   Problem: Elimination: Goal: Will not experience complications related to bowel motility 08/20/2020 1347 by Ansel Bong, RN Outcome: Progressing 08/20/2020 1347 by Ansel Bong, RN Outcome: Progressing Goal: Will not experience complications related to urinary retention 08/20/2020 1347 by Ansel Bong, RN Outcome: Progressing 08/20/2020 1347 by Ansel Bong, RN Outcome: Progressing   Problem: Pain Managment: Goal: General experience of comfort will improve 08/20/2020 1347 by Ansel Bong, RN Outcome: Progressing 08/20/2020 1347 by Ansel Bong, RN Outcome: Progressing   Problem: Safety: Goal: Ability to remain free from injury will improve 08/20/2020 1347 by Ansel Bong, RN Outcome: Progressing 08/20/2020 1347 by Ansel Bong, RN Outcome: Progressing   Problem: Skin Integrity: Goal: Risk for impaired skin integrity will decrease 08/20/2020 1347 by Ansel Bong, RN Outcome: Progressing 08/20/2020 1347 by Ansel Bong, RN Outcome: Progressing

## 2020-08-20 NOTE — Progress Notes (Signed)
PROGRESS NOTE    Darin Hopkins  DJM:[id number] DOB: May 13, 1960 DOA: 08/17/2020 PCP: Martie Round, NP   Brief Narrative: Taken from prior notes. 59 y.o.malewith medical history significant forhypertension, history of GERD, history of heart failure exacerbation, history of seizures, last seizure was approximately 2 years ago, CKD 3, hyperlipidemia, COPD,history of CVA about 2.5 years ago, alcohol abuse, tobacco abuse,presents to the emergency department for chief concerns of shortness of breath and worsening lower extremity edema. He was compliant with his medications though he has not seen a doctor in 2 years. BNP elevated at 1306.  Echocardiogram with normal EF and normal diastolic function. He was hypoxic on admission, requiring initially BiPAP.  Currently on 2 to 4 L of oxygen.  No prior oxygen use.  Chest x-ray with airspace infiltrates bilaterally suspicious for pulmonary edema.  Repeat chest x-ray with peribronchial thickening and concern for atypical pneumonia.  Patient remained afebrile and no leukocytosis.  Subjective: Patient was seen and examined today.  Appears quite malnourished.  No new complaint.  Continues to desaturate on room air requiring 2 to 4 L of oxygen.  Some desaturation with minor exertion.  No baseline oxygen use.  Assessment & Plan:   Principal Problem:   Acute exacerbation of CHF (congestive heart failure) (HCC) Active Problems:   Essential hypertension   History of seizure   COPD (chronic obstructive pulmonary disease) (HCC)   Hyperlipidemia   GERD (gastroesophageal reflux disease)   Alcohol abuse   Acute decompensated heart failure (HCC)  Acute hypoxic respiratory failure most likely secondary to acute diastolic heart failure. Elevated BNP and pulmonary and lower extremity edema.  Echocardiogram within normal limit.  No significant diuresis even with increased dose of Lasix at 60 mg twice daily. Total net of -1900. -CT chest for further  evaluation of bilateral infiltrates. -Procalcitonin. -Continue with Lasix at 60 mg twice daily. -Add metolazone -Continue with daily BMP and weight -Strict intake and output -Cardiology consult. -Supplemental oxygen to keep the saturation above 90%.  Elevated troponin.  No chest pain and EKG without any acute changes.  Most likely secondary to demand ischemia with heart failure exacerbation.  Hypertension.  Blood pressure within goal. -Continue home dose of lisinopril and amlodipine. -Keep holding home dose of HCTZ and hydralazine. -Patient is also on IV Lasix.  CKD stage IIIb.  Baseline creatinine appears to be around 1.5-1.7.  Currently stable -Monitor renal function. -Avoid nephrotoxins  COPD.  Does not appear to be in exacerbation. -Continue home inhalers and bronchodilators  History of seizure disorder.  Last known seizure was approximately 2 years ago. -Continue with Keppra 500 mg twice daily -Continue with seizure precautions  Alcohol abuse. drinks 1/5 of liquor per day per sister, CIWA protocal initiated No evidence of clinical withdrawals at this time. -Continue with CIWA protocol  Tobacco abuse. -Nicotine patch  Hyperlipidemia. Continue with atorvastatin.  History of CVA. -Continue with home dose of aspirin and statin  GERD. -Continue home dose of omeprazole.  Malnourishment.  Patient appears malnourished.  Albumin within normal limit. Estimated body mass index is 21.54 kg/m as calculated from the following:   Height as of this encounter: 5\' 7"  (1.702 m).   Weight as of this encounter: 62.4 kg.  -Dietitian consult  Physical deconditioning. -PT is recommending SNF placement-unable to find any SNF who accept his insurance. Most likely will be going home with home health services as TOC find one company who can provide RN and PT. sister will be helping.  Objective:  Vitals:   08/20/20 0555 08/20/20 0930 08/20/20 1113 08/20/20 1152  BP:  126/79  103/80   Pulse:  70  67  Resp:  17  18  Temp:  99.2 F (37.3 C)  97.6 F (36.4 C)  TempSrc:  Oral    SpO2:  98%  97%  Weight: 67.2 kg  62.4 kg   Height:        Intake/Output Summary (Last 24 hours) at 08/20/2020 1538 Last data filed at 08/20/2020 1417 Gross per 24 hour  Intake 960 ml  Output 1700 ml  Net -740 ml   Filed Weights   08/18/20 1457 08/20/20 0555 08/20/20 1113  Weight: 63.7 kg 67.2 kg 62.4 kg    Examination:  General exam: Frail gentleman, appears calm and comfortable  Respiratory system: Decreased breath sounds bilaterally, no wheezing. Respiratory effort normal. Cardiovascular system: S1 & S2 heard, RRR.  Gastrointestinal system: Soft, nontender, nondistended, bowel sounds positive. Central nervous system: Alert and oriented. No focal neurological deficits. Extremities: 1+ LE edema, no cyanosis, pulses intact and symmetrical. Psychiatry: Judgement and insight appear normal.    DVT prophylaxis: Lovenox Code Status: Full. Family Communication: Discussed with patient. Disposition Plan:  Status is: Inpatient  Remains inpatient appropriate because:Inpatient level of care appropriate due to severity of illness   Dispo: The patient is from: Home              Anticipated d/c is to: Home              Patient currently is not medically stable to d/c.   Difficult to place patient Yes               Level of care: Progressive Cardiac  All the records are reviewed and case discussed with Care Management/Social Worker. Management plans discussed with the patient, nursing and they are in agreement.  Consultants:   Cardiology  Procedures:  Antimicrobials:   Data Reviewed: I have personally reviewed following labs and imaging studies  CBC: Recent Labs  Lab 08/17/20 1037 08/18/20 0329  WBC 9.9 7.1  NEUTROABS 4.2 3.0  HGB 13.3 12.9*  HCT 44.3 41.1  MCV 103.5* 100.5*  PLT 204 213   Basic Metabolic Panel: Recent Labs  Lab 08/17/20 1037 08/17/20 1418  08/18/20 0329 08/19/20 0415 08/20/20 0426  NA 146*  --  143 140 141  K 4.8  --  4.7 3.7 4.7  CL 112*  --  107 103 104  CO2 27  --  30 29 32  GLUCOSE 103*  --  93 89 102*  BUN 41*  --  43* 43* 37*  CREATININE 1.54*  --  1.73* 1.63* 1.52*  CALCIUM 8.4*  --  8.4* 8.2* 8.3*  MG  --  1.9  --   --  1.8  PHOS  --  3.6  --   --   --    GFR: Estimated Creatinine Clearance: 46.2 mL/min (A) (by C-G formula based on SCr of 1.52 mg/dL (H)). Liver Function Tests: Recent Labs  Lab 08/17/20 1037  AST 22  ALT 14  ALKPHOS 40  BILITOT 0.6  PROT 6.9  ALBUMIN 3.6   No results for input(s): LIPASE, AMYLASE in the last 168 hours. No results for input(s): AMMONIA in the last 168 hours. Coagulation Profile: No results for input(s): INR, PROTIME in the last 168 hours. Cardiac Enzymes: No results for input(s): CKTOTAL, CKMB, CKMBINDEX, TROPONINI in the last 168 hours. BNP (last 3 results) No results for  input(s): PROBNP in the last 8760 hours. HbA1C: No results for input(s): HGBA1C in the last 72 hours. CBG: Recent Labs  Lab 08/17/20 1342  GLUCAP 110*   Lipid Profile: No results for input(s): CHOL, HDL, LDLCALC, TRIG, CHOLHDL, LDLDIRECT in the last 72 hours. Thyroid Function Tests: No results for input(s): TSH, T4TOTAL, FREET4, T3FREE, THYROIDAB in the last 72 hours. Anemia Panel: No results for input(s): VITAMINB12, FOLATE, FERRITIN, TIBC, IRON, RETICCTPCT in the last 72 hours. Sepsis Labs: No results for input(s): PROCALCITON, LATICACIDVEN in the last 168 hours.  Recent Results (from the past 240 hour(s))  Resp Panel by RT-PCR (Flu A&B, Covid) Nasopharyngeal Swab     Status: None   Collection Time: 08/17/20 10:38 AM   Specimen: Nasopharyngeal Swab; Nasopharyngeal(NP) swabs in vial transport medium  Result Value Ref [place] Status   SARS Coronavirus 2 by RT PCR NEGATIVE NEGATIVE Final    Comment: (NOTE) SARS-CoV-2 target nucleic acids are NOT DETECTED.  The SARS-CoV-2 RNA is  generally detectable in upper respiratory specimens during the acute phase of infection. The lowest concentration of SARS-CoV-2 viral copies this assay can detect is 138 copies/mL. A negative result does not preclude SARS-Cov-2 infection and should not be used as the sole basis for treatment or other patient management decisions. A negative result may occur with  improper specimen collection/handling, submission of specimen other than nasopharyngeal swab, presence of viral mutation(s) within the areas targeted by this assay, and inadequate number of viral copies(<138 copies/mL). A negative result must be combined with clinical observations, patient history, and epidemiological information. The expected result is Negative.  Fact Sheet for Patients:  BloggerCourse.com  Fact Sheet for Healthcare Providers:  SeriousBroker.it  This test is no t yet approved or cleared by the [place] FDA and  has been authorized for detection and/or diagnosis of SARS-CoV-2 by FDA under an Emergency Use Authorization (EUA). This EUA will remain  in effect (meaning this test can be used) for the duration of the COVID-19 declaration under Section 564(b)(1) of the Act, 21 U.S.C.section 360bbb-3(b)(1), unless the authorization is terminated  or revoked sooner.       Influenza A by PCR NEGATIVE NEGATIVE Final   Influenza B by PCR NEGATIVE NEGATIVE Final    Comment: (NOTE) The Xpert Xpress SARS-CoV-2/FLU/RSV plus assay is intended as an aid in the diagnosis of influenza from Nasopharyngeal swab specimens and should not be used as a sole basis for treatment. Nasal washings and aspirates are unacceptable for Xpert Xpress SARS-CoV-2/FLU/RSV testing.  Fact Sheet for Patients: BloggerCourse.com  Fact Sheet for Healthcare Providers: SeriousBroker.it  This test is not yet approved or cleared by the [place] FDA and has been authorized for detection and/or diagnosis of SARS-CoV-2 by FDA under an Emergency Use Authorization (EUA). This EUA will remain in effect (meaning this test can be used) for the duration of the COVID-19 declaration under Section 564(b)(1) of the Act, 21 U.S.C. section 360bbb-3(b)(1), unless the authorization is terminated or revoked.  Performed at [facility], [address] [place], [place] [address]   MRSA PCR Screening     Status: None   Collection Time: 08/17/20  1:45 PM   Specimen: Nasal Mucosa; Nasopharyngeal  Result Value Ref [place] Status   MRSA by PCR NEGATIVE NEGATIVE Final    Comment:        The GeneXpert MRSA Assay (FDA approved for NASAL specimens only), is one component of a comprehensive MRSA colonization surveillance program. It is not intended  to diagnose MRSA infection nor to guide or monitor treatment for MRSA infections. Performed at [facility], [address] [place], [place] [address]      Radiology Studies: DG Chest [place] 1 View  Result Date: 08/19/2020 CLINICAL DATA:  Respiratory failure EXAM: PORTABLE CHEST 1 VIEW COMPARISON:  08/17/2020 FINDINGS: Cardiac shadow is enlarged but stable somewhat accentuated by the frontal technique. Increasing bibasilar opacity is noted consistent with focal infiltrate and small effusion. IMPRESSION: Increasing bibasilar airspace opacities with associated effusions. Electronically Signed   By: Alcide Clever M.D.   On: 08/19/2020 10:48    Scheduled Meds: . amLODipine  5 mg Oral QHS  . aspirin EC  81 mg Oral Daily  . atorvastatin  20 mg Oral Daily  . Chlorhexidine Gluconate Cloth  6 each Topical Q2200  . enoxaparin (LOVENOX) injection  40 mg Subcutaneous Q24H  . folic acid  1 mg Oral Daily  . furosemide  60 mg Intravenous Q12H  . Ipratropium-Albuterol  1 puff Inhalation QID  . levETIRAcetam  500 mg Oral BID  . lisinopril  5 mg Oral Daily  . mometasone-formoterol  2 puff  Inhalation BID  . multivitamin with minerals  1 tablet Oral Daily  . pantoprazole  40 mg Oral Daily  . sodium chloride flush  3 mL Intravenous Q12H  . thiamine  100 mg Oral Daily   Or  . thiamine  100 mg Intravenous Daily   Continuous Infusions: . sodium chloride       LOS: 2 days   Time spent: 45 minutes. More than 50% of the time was spent in counseling/coordination of care  Arnetha Courser, MD [facility]  If 7PM-7AM, please contact night-coverage [email or web address]  08/20/2020, 3:38 PM   This record has been created using Conservation officer, historic buildings. Errors have been sought and corrected,but may not always be located. Such creation errors do not reflect on the standard of care.

## 2020-08-20 NOTE — TOC Progression Note (Signed)
Transition of Care [facility]) - Progression Note    Patient Details  Name: Darin Hopkins MRN: [id number] Date of Birth: 24-Dec-1960  Transition of [facility]) CM/SW Contact  Maree Krabbe, LCSW Phone Number: 08/20/2020, 1:47 PM  Clinical Narrative:   [facility] will service pt with RN and PT. Per MD dc tomorrow. DME referral given to [facility] with [employer].    Expected Discharge Plan: Home w Home Health Services Barriers to Discharge: Continued Medical Work up  Expected Discharge Plan and Services Expected Discharge Plan: Home w Home Health Services In-house Referral: Clinical Social Work   Post Acute Care Choice: Home Health Living arrangements for the past 2 months: Single Family Home                 DME Arranged: Shower stool,Bedside commode,Walker DME Agency: [employer]                   Social Determinants of Health (SDOH) Interventions    Readmission Risk Interventions No flowsheet data found.

## 2020-08-20 NOTE — Progress Notes (Signed)
Mobility Specialist - Progress Note   08/20/20 1600  Mobility  Activity Sat and stood x 3  Level of Assistance Minimal assist, patient does 75% or more  Assistive Device Front wheel walker  Distance Ambulated (ft) 0 ft  Mobility Response Tolerated well  Mobility performed by Mobility specialist  $Mobility charge 1 Mobility    Pre-mobility: 70 HR, 93% SpO2 During mobility: 74 HR, 89% SpO2 Post-mobility: 71 HR, 92% SpO2   Pt performed STS x3 at EOB with minA. Utilizing 2L, O2 desat to a low of 89%. Denied SOB. Further activity deferred d/t pt's request to return supine.    Filiberto Pinks Mobility Specialist 08/20/20, 4:30 PM

## 2020-08-20 NOTE — TOC Initial Note (Signed)
Transition of Care [facility]) - Initial/Assessment Note    Patient Details  Name: Darin Hopkins MRN: [id number] Date of Birth: 10-Mar-1961  Transition of Care [facility]) CM/SW Contact:    Maree Krabbe, LCSW Phone Number: 08/20/2020, 1:24 PM  Clinical Narrative:  CSW spoke with pt's sister--at beside. PT sister states she doesn't want pt to go to just any snf. CSW explained with pt's insurance it is highly unlikely that pt will get a SNF bed. Pt's sister states pt lives with her and she is there all day every day. Pt's sister is agreeable to dc home with home health if MD determined it is safe. Pt's sister states they would need a walker, bedside commode, and a shower stool. CSW will start the search for [facility] and order the DME.                 Expected Discharge Plan: Home w Home Health Services Barriers to Discharge: Continued Medical Work up   Patient Goals and CMS Choice Patient states their goals for this hospitalization and ongoing recovery are:: to get pt home   Choice offered to / list presented to : Sibling  Expected Discharge Plan and Services Expected Discharge Plan: Home w Home Health Services In-house Referral: Clinical Social Work   Post Acute Care Choice: Home Health Living arrangements for the past 2 months: Single Family Home                 DME Arranged: Shower stool,Bedside commode,Walker DME Agency: [employer]                  Prior Living Arrangements/Services Living arrangements for the past 2 months: Single Family Home Lives with:: Siblings Patient language and need for interpreter reviewed:: Yes Do you feel safe going back to the place where you live?: Yes      Need for Family Participation in Patient Care: Yes (Comment) Care giver support system in place?: Yes (comment)   Criminal Activity/Legal Involvement Pertinent to Current Situation/Hospitalization: No - Comment as needed  Activities of Daily Living Home Assistive Devices/Equipment: None ADL  Screening (condition at time of admission) Patient's cognitive ability adequate to safely complete daily activities?: Yes Is the patient deaf or have difficulty hearing?: No Does the patient have difficulty seeing, even when wearing glasses/contacts?: No Does the patient have difficulty concentrating, remembering, or making decisions?: No Patient able to express need for assistance with ADLs?: Yes Does the patient have difficulty dressing or bathing?: No Independently performs ADLs?: Yes (appropriate for developmental age) Does the patient have difficulty walking or climbing stairs?: No Weakness of Legs: None Weakness of Arms/Hands: None  Permission Sought/Granted Permission sought to share information with : Family Supports Permission granted to share information with : Yes, Verbal Permission Granted  Share Information with NAME: mary     Permission granted to share info w Relationship: sister     Emotional Assessment Appearance:: Appears stated age Attitude/Demeanor/Rapport: Engaged Affect (typically observed): Accepting Orientation: : Oriented to Self,Oriented to  Time,Oriented to Place,Oriented to Situation Alcohol / Substance Use: Not Applicable Psych Involvement: No (comment)  Admission diagnosis:  Acute exacerbation of CHF (congestive heart failure) (HCC) [I50.9] Acute respiratory failure with hypoxia (HCC) [J96.01] Acute decompensated heart failure (HCC) [I50.9] Patient Active Problem List   Diagnosis Date Noted  . Acute decompensated heart failure (HCC) 08/18/2020  . Acute exacerbation of CHF (congestive heart failure) (HCC) 08/17/2020  . Essential hypertension 08/17/2020  . History of seizure 08/17/2020  .  COPD (chronic obstructive pulmonary disease) (HCC) 08/17/2020  . Hyperlipidemia 08/17/2020  . GERD (gastroesophageal reflux disease) 08/17/2020  . Alcohol abuse 08/17/2020   PCP:  Martie Round, NP Pharmacy:  No Pharmacies Listed    Social Determinants of  Health (SDOH) Interventions    Readmission Risk Interventions No flowsheet data found.

## 2020-08-20 NOTE — Progress Notes (Signed)
Physical Therapy Treatment Patient Details Name: Darin Hopkins MRN: [id number] DOB: Sep 29, 1960 Today's Date: 08/20/2020    History of Present Illness Patient is a 60 y.o. male with medical history significant for hypertension, history of GERD, history of heart failure exacerbation, history of seizures, last seizure was approximately 2 years ago, CKD 3, hyperlipidemia, COPD, history of CVA about 2.5 years ago, alcohol abuse, tobacco abuse, presents to the emergency department for chief concerns of shortness of breath. Found to have Acute decompensated diastolic heart failure and Acute hypoxic respiratory failure    PT Comments    Therapist in this pm to see pt, sister at bedside.  Pt resting on 2L O2 with sats at 91%.  Pt placed on RA for supine to sit with CGA and verbal cues for technique.  Pt's O2 sats dropped to 83%, cues given for proper breathing techniques, yet O2 sats did not improve.  Pt assisted back to bed, placed back on 2L O2 with sats returning to 90-91% after a few minutes.  Pt may need home O2 upon d/c.  Will discuss with team.  Continue to recommend SNF placement if able prior to returning home with assistance from sister. Discussed d/c needs at length with sister.    Follow Up Recommendations  SNF     Equipment Recommendations       Recommendations for Other Services       Precautions / Restrictions Precautions Precautions: Fall Restrictions Weight Bearing Restrictions: No    Mobility  Bed Mobility Overal bed mobility: Needs Assistance Bed Mobility: Supine to Sit     Supine to sit: Min guard Sit to supine: Min guard   General bed mobility comments: Requires verbal cues for safety    Transfers Overall transfer level: Needs assistance   Transfers: Sit to/from Stand Sit to Stand: Min guard         General transfer comment: Pt O2 sats 83% on RA sitting EOB  Ambulation/Gait                 Stairs             Wheelchair Mobility     Modified Rankin (Stroke Patients Only)       Balance                                            Cognition Arousal/Alertness: Awake/alert Behavior During Therapy: WFL for tasks assessed/performed Overall Cognitive Status: History of cognitive impairments - at baseline                                 General Comments: A&Ox3 (self, place, month/year). Decreased insight into deficits. Requires increased processing time and has difficultly following multi-step commands      Exercises      General Comments        Pertinent Vitals/Pain      Home Living                      Prior Function            PT Goals (current goals can now be found in the care plan section) Acute Rehab PT Goals Patient Stated Goal: to go home    Frequency    Min 2X/week      PT Plan  Co-evaluation              AM-PAC PT "6 Clicks" Mobility   Outcome Measure  Help needed turning from your back to your side while in a flat bed without using bedrails?: A Little Help needed moving from lying on your back to sitting on the side of a flat bed without using bedrails?: A Little Help needed moving to and from a bed to a chair (including a wheelchair)?: A Little Help needed standing up from a chair using your arms (e.g., wheelchair or bedside chair)?: A Little Help needed to walk in hospital room?: A Lot Help needed climbing 3-5 steps with a railing? : A Lot 6 Click Score: 16    End of Session Equipment Utilized During Treatment: Oxygen Activity Tolerance: Patient limited by fatigue Patient left: in bed;with call bell/phone within reach;with bed alarm set;with SCD's reapplied Nurse Communication: Mobility status PT Visit Diagnosis: Unsteadiness on feet (R26.81);Muscle weakness (generalized) (M62.81);History of falling (Z91.81)     Time: 7341-9379 PT Time Calculation (min) (ACUTE ONLY): 24 min  Charges:  $Therapeutic Activity: 23-37  mins                     Zadie Cleverly, PTA   Jannet Askew 08/20/2020, 2:25 PM

## 2020-08-21 DIAGNOSIS — E43 Unspecified severe protein-calorie malnutrition: Secondary | ICD-10-CM | POA: Insufficient documentation

## 2020-08-21 DIAGNOSIS — J810 Acute pulmonary edema: Secondary | ICD-10-CM

## 2020-08-21 LAB — BASIC METABOLIC PANEL
Anion gap: 7 (ref 5–15)
BUN: 35 mg/dL — ABNORMAL HIGH (ref 6–20)
CO2: 35 mmol/L — ABNORMAL HIGH (ref 22–32)
Calcium: 8.4 mg/dL — ABNORMAL LOW (ref 8.9–10.3)
Chloride: 98 mmol/L (ref 98–111)
Creatinine, Ser: 1.52 mg/dL — ABNORMAL HIGH (ref 0.61–1.24)
GFR, Estimated: 52 mL/min — ABNORMAL LOW (ref >60)
Glucose, Bld: 103 mg/dL — ABNORMAL HIGH (ref 70–99)
Potassium: 4.5 mmol/L (ref 3.5–5.1)
Sodium: 140 mmol/L (ref 135–145)

## 2020-08-21 LAB — LIPID PANEL
Cholesterol: 179 mg/dL (ref 0–200)
HDL: 75 mg/dL (ref >40)
LDL Cholesterol: 93 mg/dL (ref 0–99)
Total CHOL/HDL Ratio: 2.4 RATIO
Triglycerides: 55 mg/dL (ref <150)
VLDL: 11 mg/dL (ref 0–40)

## 2020-08-21 LAB — MAGNESIUM: Magnesium: 1.8 mg/dL (ref 1.7–2.4)

## 2020-08-21 LAB — TROPONIN I (HIGH SENSITIVITY): Troponin I (High Sensitivity): 41 ng/L — ABNORMAL HIGH (ref <18)

## 2020-08-21 MED ORDER — AMLODIPINE BESYLATE 10 MG PO TABS
10.0000 mg | ORAL_TABLET | Freq: Every day | ORAL | Status: DC
  Administered 2020-08-21 – 2020-08-24 (×5): 10 mg via ORAL
  Filled 2020-08-21 (×5): qty 1

## 2020-08-21 MED ORDER — ENSURE ENLIVE PO LIQD
237.0000 mL | Freq: Three times a day (TID) | ORAL | Status: DC
  Administered 2020-08-21 – 2020-08-24 (×6): 237 mL via ORAL

## 2020-08-21 NOTE — Progress Notes (Signed)
PROGRESS NOTE    JILL RUPPE  ELF:[id number] DOB: Oct 18, 1960 DOA: 08/17/2020 PCP: Martie Round, NP   Brief Narrative: Taken from prior notes. 59 y.o.malewith medical history significant forhypertension, history of GERD, history of heart failure exacerbation, history of seizures, last seizure was approximately 2 years ago, CKD 3, hyperlipidemia, COPD,history of CVA about 2.5 years ago, alcohol abuse, tobacco abuse,presents to the emergency department for chief concerns of shortness of breath and worsening lower extremity edema. He was compliant with his medications though he has not seen a doctor in 2 years. BNP elevated at 1306.  Echocardiogram with normal EF and normal diastolic function. He was hypoxic on admission, requiring initially BiPAP.  Currently on 2 to 4 L of oxygen.  No prior oxygen use.  Chest x-ray with airspace infiltrates bilaterally suspicious for pulmonary edema.  Repeat chest x-ray with peribronchial thickening and concern for atypical pneumonia.  Patient remained afebrile and no leukocytosis.  Subjective: Patient was seen during morning rounds today.  Continues to have shortness of breath with minor exertion.  No chest pain.  Assessment & Plan:   Principal Problem:   Acute exacerbation of CHF (congestive heart failure) (HCC) Active Problems:   Essential hypertension   History of seizure   COPD (chronic obstructive pulmonary disease) (HCC)   Hyperlipidemia   GERD (gastroesophageal reflux disease)   Alcohol abuse   Acute decompensated heart failure (HCC)   Acute hypoxemic respiratory failure (HCC)   Protein-calorie malnutrition, severe  Acute hypoxic respiratory failure most likely secondary to acute diastolic heart failure. Elevated BNP and pulmonary and lower extremity edema.  Echocardiogram within normal limit.  Some improvement in diuresis after adding metolazone with Lasix Total net of -2800. -CT chest for further evaluation of bilateral  infiltrates-shows concern of COPD, no interstitial lung disease and bilateral small pleural effusions. -Procalcitonin-negative -Continue with Lasix at 60 mg twice daily. -Continue metolazone -Continue with daily BMP and weight -Strict intake and output -Cardiology consult-going for Myoview tomorrow. -Supplemental oxygen to keep the saturation above 90%.  Elevated troponin.  No chest pain and EKG without any acute changes.  Most likely secondary to demand ischemia with heart failure exacerbation. -Going for [employer]  Hypertension.  Blood pressure within goal. -Continue home dose of lisinopril and amlodipine. -Keep holding home dose of HCTZ and hydralazine. -Patient is also on IV Lasix.  CKD stage IIIb.  Baseline creatinine appears to be around 1.5-1.7.  Currently stable -Monitor renal function while patient is being diuresed. -Avoid nephrotoxins  COPD.  Does not appear to be in exacerbation. -Continue home inhalers and bronchodilators  History of seizure disorder.  Last known seizure was approximately 2 years ago. -Continue with Keppra 500 mg twice daily -Continue with seizure precautions  Alcohol abuse. drinks 1/5 of liquor per day per sister, CIWA protocal initiated No evidence of clinical withdrawals at this time. -Continue with CIWA protocol -Continue counseling for alcohol cessation  Tobacco abuse. -Nicotine patch  Hyperlipidemia. Continue with atorvastatin.  History of CVA. -Continue with home dose of aspirin and statin  GERD. -Continue home dose of omeprazole.  Malnourishment.  Patient appears malnourished.  Albumin within normal limit. Estimated body mass index is 21.41 kg/m as calculated from the following:   Height as of this encounter: 5\' 7"  (1.702 m).   Weight as of this encounter: 62 kg.  -Dietitian consult  Physical deconditioning. -PT is recommending SNF placement-unable to find any SNF who accept his insurance. Most likely will be going  home with home  health services as TOC find one company who can provide RN and PT. sister will be helping.  Objective: Vitals:   08/21/20 0317 08/21/20 0521 08/21/20 0815 08/21/20 0816  BP: 107/78  (!) 142/90   Pulse: 91  99   Resp: 16  18   Temp: 98.8 F (37.1 C)  99.1 F (37.3 C)   TempSrc:   Axillary   SpO2: (!) 67%  (!) 68% 92%  Weight:  62 kg    Height:        Intake/Output Summary (Last 24 hours) at 08/21/2020 1539 Last data filed at 08/21/2020 1345 Gross per 24 hour  Intake 1243 ml  Output 2575 ml  Net -1332 ml   Filed Weights   08/20/20 0555 08/20/20 1113 08/21/20 0521  Weight: 67.2 kg 62.4 kg 62 kg    Examination:  General.  Malnourished gentleman, in no acute distress. Pulmonary.  Few basal crackles, normal respiratory effort. CV.  Regular rate and rhythm, no JVD, rub or murmur. Abdomen.  Soft, nontender, nondistended, BS positive. CNS.  Alert and oriented x3.  No focal neurologic deficit. Extremities.  No edema, no cyanosis, pulses intact and symmetrical. Psychiatry.  Judgment and insight appears normal.   DVT prophylaxis: Lovenox Code Status: Full. Family Communication: Discussed with patient. Disposition Plan:  Status is: Inpatient  Remains inpatient appropriate because:Inpatient level of care appropriate due to severity of illness   Dispo: The patient is from: Home              Anticipated d/c is to: Home              Patient currently is not medically stable to d/c.   Difficult to place patient Yes               Level of care: Progressive Cardiac  All the records are reviewed and case discussed with Care Management/Social Worker. Management plans discussed with the patient, nursing and they are in agreement.  Consultants:   Cardiology  Procedures:  Antimicrobials:   Data Reviewed: I have personally reviewed following labs and imaging studies  CBC: Recent Labs  Lab 08/17/20 1037 08/18/20 0329  WBC 9.9 7.1  NEUTROABS 4.2 3.0  HGB  13.3 12.9*  HCT 44.3 41.1  MCV 103.5* 100.5*  PLT 204 213   Basic Metabolic Panel: Recent Labs  Lab 08/17/20 1037 08/17/20 1418 08/18/20 0329 08/19/20 0415 08/20/20 0426 08/21/20 0415  NA 146*  --  143 140 141 140  K 4.8  --  4.7 3.7 4.7 4.5  CL 112*  --  107 103 104 98  CO2 27  --  30 29 32 35*  GLUCOSE 103*  --  93 89 102* 103*  BUN 41*  --  43* 43* 37* 35*  CREATININE 1.54*  --  1.73* 1.63* 1.52* 1.52*  CALCIUM 8.4*  --  8.4* 8.2* 8.3* 8.4*  MG  --  1.9  --   --  1.8 1.8  PHOS  --  3.6  --   --   --   --    GFR: Estimated Creatinine Clearance: 45.9 mL/min (A) (by C-G formula based on SCr of 1.52 mg/dL (H)). Liver Function Tests: Recent Labs  Lab 08/17/20 1037  AST 22  ALT 14  ALKPHOS 40  BILITOT 0.6  PROT 6.9  ALBUMIN 3.6   No results for input(s): LIPASE, AMYLASE in the last 168 hours. No results for input(s): AMMONIA in the last 168 hours. Coagulation Profile:  No results for input(s): INR, PROTIME in the last 168 hours. Cardiac Enzymes: No results for input(s): CKTOTAL, CKMB, CKMBINDEX, TROPONINI in the last 168 hours. BNP (last 3 results) No results for input(s): PROBNP in the last 8760 hours. HbA1C: No results for input(s): HGBA1C in the last 72 hours. CBG: Recent Labs  Lab 08/17/20 1342  GLUCAP 110*   Lipid Profile: Recent Labs    08/21/20 1114  CHOL 179  HDL 75  LDLCALC 93  TRIG 55  CHOLHDL 2.4   Thyroid Function Tests: No results for input(s): TSH, T4TOTAL, FREET4, T3FREE, THYROIDAB in the last 72 hours. Anemia Panel: No results for input(s): VITAMINB12, FOLATE, FERRITIN, TIBC, IRON, RETICCTPCT in the last 72 hours. Sepsis Labs: Recent Labs  Lab 08/20/20 0428  PROCALCITON <0.10    Recent Results (from the past 240 hour(s))  Resp Panel by RT-PCR (Flu A&B, Covid) Nasopharyngeal Swab     Status: None   Collection Time: 08/17/20 10:38 AM   Specimen: Nasopharyngeal Swab; Nasopharyngeal(NP) swabs in vial transport medium  Result Value  Ref Range Status   SARS Coronavirus 2 by RT PCR NEGATIVE NEGATIVE Final    Comment: (NOTE) SARS-CoV-2 target nucleic acids are NOT DETECTED.  The SARS-CoV-2 RNA is generally detectable in upper respiratory specimens during the acute phase of infection. The lowest concentration of SARS-CoV-2 viral copies this assay can detect is 138 copies/mL. A negative result does not preclude SARS-Cov-2 infection and should not be used as the sole basis for treatment or other patient management decisions. A negative result may occur with  improper specimen collection/handling, submission of specimen other than nasopharyngeal swab, presence of viral mutation(s) within the areas targeted by this assay, and inadequate number of viral copies(<138 copies/mL). A negative result must be combined with clinical observations, patient history, and epidemiological information. The expected result is Negative.  Fact Sheet for Patients:  BloggerCourse.comhttps://www.fda.gov/media/152166/download  Fact Sheet for Healthcare Providers:  SeriousBroker.ithttps://www.fda.gov/media/152162/download  This test is no t yet approved or cleared by the [place] FDA and  has been authorized for detection and/or diagnosis of SARS-CoV-2 by FDA under an Emergency Use Authorization (EUA). This EUA will remain  in effect (meaning this test can be used) for the duration of the COVID-19 declaration under Section 564(b)(1) of the Act, 21 U.S.C.section 360bbb-3(b)(1), unless the authorization is terminated  or revoked sooner.       Influenza A by PCR NEGATIVE NEGATIVE Final   Influenza B by PCR NEGATIVE NEGATIVE Final    Comment: (NOTE) The Xpert Xpress SARS-CoV-2/FLU/RSV plus assay is intended as an aid in the diagnosis of influenza from Nasopharyngeal swab specimens and should not be used as a sole basis for treatment. Nasal washings and aspirates are unacceptable for Xpert Xpress SARS-CoV-2/FLU/RSV testing.  Fact Sheet for  Patients: BloggerCourse.comhttps://www.fda.gov/media/152166/download  Fact Sheet for Healthcare Providers: SeriousBroker.ithttps://www.fda.gov/media/152162/download  This test is not yet approved or cleared by the [place] FDA and has been authorized for detection and/or diagnosis of SARS-CoV-2 by FDA under an Emergency Use Authorization (EUA). This EUA will remain in effect (meaning this test can be used) for the duration of the COVID-19 declaration under Section 564(b)(1) of the Act, 21 U.S.C. section 360bbb-3(b)(1), unless the authorization is terminated or revoked.  Performed at [facility], [address] [place], [place] [address]   MRSA PCR Screening     Status: None   Collection Time: 08/17/20  1:45 PM   Specimen: Nasal Mucosa; Nasopharyngeal  Result Value Ref Range Status  MRSA by PCR NEGATIVE NEGATIVE Final    Comment:        The GeneXpert MRSA Assay (FDA approved for NASAL specimens only), is one component of a comprehensive MRSA colonization surveillance program. It is not intended to diagnose MRSA infection nor to guide or monitor treatment for MRSA infections. Performed at [facility], [address] [place], [place] [address]      Radiology Studies: CT Chest High Resolution  Result Date: 08/20/2020 CLINICAL DATA:  60 year old male with history of hypoxemia. Evaluate for interstitial lung disease. EXAM: CT CHEST WITHOUT CONTRAST TECHNIQUE: Multidetector CT imaging of the chest was performed following the standard protocol without intravenous contrast. High resolution imaging of the lungs, as well as inspiratory and expiratory imaging, was performed. COMPARISON:  No priors. FINDINGS: Cardiovascular: Heart size is normal. There is no significant pericardial fluid, thickening or pericardial calcification. There is aortic atherosclerosis, as well as atherosclerosis of the great vessels of the mediastinum and the coronary arteries, including calcified atherosclerotic  plaque in the left main, left anterior descending, left circumflex and right coronary arteries. Calcifications of the aortic valve. Mediastinum/Nodes: No pathologically enlarged mediastinal or hilar lymph nodes. Please note that accurate exclusion of hilar adenopathy is limited on noncontrast CT scans. Esophagus is unremarkable in appearance. No axillary lymphadenopathy. Lungs/Pleura: Diffuse bronchial wall thickening with mild to moderate centrilobular and paraseptal emphysema. High-resolution images demonstrate no definite regions of ground-glass attenuation, septal thickening, subpleural reticulation, traction bronchiectasis or frank honeycombing to indicate interstitial lung disease. Inspiratory and expiratory imaging is unremarkable in appearance. Small bilateral pleural effusions with areas of dependent subsegmental atelectasis in the lower lobes of the lungs bilaterally. Upper Abdomen: Aortic atherosclerosis. Musculoskeletal: There are no aggressive appearing lytic or blastic lesions noted in the visualized portions of the skeleton. IMPRESSION: 1. No findings to suggest interstitial lung disease. 2. Diffuse bronchial wall thickening with mild to moderate centrilobular and paraseptal emphysema; imaging findings suggestive of underlying COPD. 3. Small bilateral pleural effusions with bibasilar areas of subsegmental atelectasis in the lower lobes of the lungs bilaterally. 4. Aortic atherosclerosis, in addition to left main and 3 vessel coronary artery disease. Please note that although the presence of coronary artery calcium documents the presence of coronary artery disease, the severity of this disease and any potential stenosis cannot be assessed on this non-gated CT examination. Assessment for potential risk factor modification, dietary therapy or pharmacologic therapy may be warranted, if clinically indicated. 5. There are calcifications of the aortic valve. Echocardiographic correlation for evaluation of  potential valvular dysfunction may be warranted if clinically indicated. Aortic Atherosclerosis (ICD10-I70.0). Electronically Signed   By: Trudie Reed M.D.   On: 08/20/2020 20:51    Scheduled Meds: . amLODipine  10 mg Oral QHS  . aspirin EC  81 mg Oral Daily  . atorvastatin  20 mg Oral Daily  . Chlorhexidine Gluconate Cloth  6 each Topical Q2200  . enoxaparin (LOVENOX) injection  40 mg Subcutaneous Q24H  . feeding supplement  237 mL Oral TID BM  . folic acid  1 mg Oral Daily  . furosemide  60 mg Intravenous Q12H  . Ipratropium-Albuterol  1 puff Inhalation QID  . levETIRAcetam  500 mg Oral BID  . lisinopril  5 mg Oral Daily  . metolazone  5 mg Oral Daily  . mometasone-formoterol  2 puff Inhalation BID  . multivitamin with minerals  1 tablet Oral Daily  . pantoprazole  40 mg Oral Daily  . sodium chloride flush  3 mL Intravenous Q12H  . thiamine  100 mg Oral Daily   Or  . thiamine  100 mg Intravenous Daily   Continuous Infusions: . sodium chloride       LOS: 3 days   Time spent: 30 minutes. More than 50% of the time was spent in counseling/coordination of care  Arnetha Courser, MD [facility]  If 7PM-7AM, please contact night-coverage [email or web address]  08/21/2020, 3:39 PM   This record has been created using Conservation officer, historic buildings. Errors have been sought and corrected,but may not always be located. Such creation errors do not reflect on the standard of care.

## 2020-08-21 NOTE — Progress Notes (Signed)
Physical Therapy Treatment Patient Details Name: LAYTH CEREZO MRN: [id number] DOB: Dec 24, 1960 Today's Date: 08/21/2020    History of Present Illness Patient is a 60 y.o. male with medical history significant for hypertension, history of GERD, history of heart failure exacerbation, history of seizures, last seizure was approximately 2 years ago, CKD 3, hyperlipidemia, COPD, history of CVA about 2.5 years ago, alcohol abuse, tobacco abuse, presents to the emergency department for chief concerns of shortness of breath. Found to have Acute decompensated diastolic heart failure and Acute hypoxic respiratory failure    PT Comments    Patient is making progress with functional independence and progressed to ambulating 7ft in the room this session using rolling walker with assistance. Patient has limited overall activity tolerance and Sp02 decreased to 85% on 4 L 02, recovering to 89% with 2-3 minute seated rest break and cues for breathing techniques. Patient continues to have limited overall activity tolerance and needs some assistance during mobility. SNF is recommended but likely not an option due to insurance reasons per social worker note. Recommend HHPT and assistance from family as needed if going home.    Follow Up Recommendations  SNF(if SNF is not an option will need HHPT and family assistance)     Equipment Recommendations  None recommended by PT    Recommendations for Other Services       Precautions / Restrictions Precautions Precautions: Fall Restrictions Weight Bearing Restrictions: No    Mobility  Bed Mobility Overal bed mobility: Needs Assistance Bed Mobility: Supine to Sit     Supine to sit: Min guard Sit to supine: Min guard   General bed mobility comments: increased time required to complete tasks due to fatigue.    Transfers Overall transfer level: Needs assistance   Transfers: Sit to/from Stand Sit to Stand: Min assist         General transfer  comment: lifting assistance required for standing. verbal cues for hand placement for safety  Ambulation/Gait Ambulation/Gait assistance: Min assist Gait Distance (Feet): 20 Feet Assistive device: Rolling walker (2 wheeled) Gait Pattern/deviations: Wide base of support Gait velocity: decreased   General Gait Details: patient ambulated in room with rolling walker with steadying assistance and occasional assistance for rolling walker negotiation. verbal cues to keep walker closer to base of support. Sp02 decreased to 85% on 4 L 02 and patient reports mild dizziness with walking. Sp02 increased to 89% with seated rest break and cues for breathing techniques after 2-3 minutes.   Stairs             Wheelchair Mobility    Modified Rankin (Stroke Patients Only)       Balance Overall balance assessment: Needs assistance;History of Falls Sitting-balance support: No upper extremity supported;Feet supported Sitting balance-Leahy Scale: Fair     Standing balance support: During functional activity;Bilateral upper extremity supported Standing balance-Leahy Scale: Fair Standing balance comment: patient needs rolling walker for UE support in standing                            Cognition Arousal/Alertness: Awake/alert Behavior During Therapy: WFL for tasks assessed/performed                                   General Comments: increased time for processing. patient able to follow single step commands with extra time      Exercises  General Comments        Pertinent Vitals/Pain Pain Assessment: No/denies pain    Home Living                      Prior Function            PT Goals (current goals can now be found in the care plan section) Acute Rehab PT Goals Patient Stated Goal: to go home PT Goal Formulation: With patient Time For Goal Achievement: 09/02/20 Potential to Achieve Goals: Fair Progress towards PT goals: Progressing  toward goals    Frequency    Min 2X/week      PT Plan Current plan remains appropriate    Co-evaluation              AM-PAC PT "6 Clicks" Mobility   Outcome Measure  Help needed turning from your back to your side while in a flat bed without using bedrails?: A Little Help needed moving from lying on your back to sitting on the side of a flat bed without using bedrails?: A Little Help needed moving to and from a bed to a chair (including a wheelchair)?: A Little Help needed standing up from a chair using your arms (e.g., wheelchair or bedside chair)?: A Little Help needed to walk in hospital room?: A Little Help needed climbing 3-5 steps with a railing? : A Lot 6 Click Score: 17    End of Session Equipment Utilized During Treatment: Oxygen Activity Tolerance: Patient limited by fatigue Patient left: in bed;with call bell/phone within reach;with bed alarm set;with family/visitor present Nurse Communication: Mobility status PT Visit Diagnosis: Unsteadiness on feet (R26.81);Muscle weakness (generalized) (M62.81);History of falling (Z91.81)     Time: 4098-1191 PT Time Calculation (min) (ACUTE ONLY): 23 min  Charges:  $Gait Training: 8-22 mins $Therapeutic Activity: 8-22 mins                     Donna Bernard, PT, MPT   Ina Homes 08/21/2020, 12:54 PM

## 2020-08-21 NOTE — Progress Notes (Signed)
Occupational Therapy Treatment Patient Details Name: Darin Hopkins MRN: [id number] DOB: March 22, 1961 Today's Date: 08/21/2020    History of present illness Patient is a 60 y.o. male with medical history significant for hypertension, history of GERD, history of heart failure exacerbation, history of seizures, last seizure was approximately 2 years ago, CKD 3, hyperlipidemia, COPD, history of CVA about 2.5 years ago, alcohol abuse, tobacco abuse, presents to the emergency department for chief concerns of shortness of breath. Found to have Acute decompensated diastolic heart failure and Acute hypoxic respiratory failure   OT comments  Pt seen for OT treatment on this date. Upon arrival to room, pt awake in bed. Pt oriented to self and place, reporting no pain, and agreeable to OT tx. Pt currently presents with decreased strength, balance, activity tolerance, and awareness of deficits, and requires MIN GUARD for functional mobility of short household distances with RW and MIN GUARD for standing UB dressing.   Pt educated on energy conservation strategies including pursed lip breathing, activity pacing, home/routines modifications, prioritizing of meaningful occupations, and falls prevention; handout provided, however pt verbalized difficulty understanding education. Pt also provided verbal, visual, and written directions on PLB, with pt unable to demonstrate understanding, breathing only through mouth and SpO2 remaining 88-90% while on 4L of O2 at start of session. Pt instructed to breathe in AND out through nose only and SpO2 improved to 93%. After walking ~20 feet with RW on 6L of O2 (RN approved prior to session), SpO2 desat to 83%. SpO2 improved to 93% following instruction to breathe in/out of nose.   At end of session, pt left in recliner, on 4L of O2 with SpO2 92-93%, and RN present. Pt is making good progress toward goals and continues to benefit from skilled OT services to maximize recall and  carryover of energy conservation strategies and facilitate implementation of strategies into daily routines. Will continue to follow POC. Discharge recommendation remains appropriate.     Follow Up Recommendations  SNF    Equipment Recommendations  3 in 1 bedside commode;Tub/shower bench       Precautions / Restrictions Precautions Precautions: Fall Restrictions Weight Bearing Restrictions: No       Mobility Bed Mobility Overal bed mobility: Needs Assistance Bed Mobility: Supine to Sit     Supine to sit: Supervision          Transfers Overall transfer level: Needs assistance Equipment used: Rolling walker (2 wheeled) Transfers: Sit to/from Stand Sit to Stand: Min guard         General transfer comment: No physical assist provided    Balance Overall balance assessment: Needs assistance;History of Falls Sitting-balance support: No upper extremity supported;Feet supported Sitting balance-Leahy Scale: Fair Sitting balance - Comments: fair seated balance at EOB Postural control: Left lateral lean Standing balance support: During functional activity;Bilateral upper extremity supported Standing balance-Leahy Scale: Fair Standing balance comment: patient needs rolling walker for UE support in standing                           ADL either performed or assessed with clinical judgement   ADL Overall ADL's : Needs assistance/impaired                 Upper Body Dressing : Min guard;Standing Upper Body Dressing Details (indicate cue type and reason): to don/doff hospital gown                 Functional mobility during ADLs:  Min guard;Rolling walker (to walk ~20 feet)                 Cognition Arousal/Alertness: Awake/alert Behavior During Therapy: WFL for tasks assessed/performed Overall Cognitive Status: No family/caregiver present to determine baseline cognitive functioning                                 General  Comments: Pt requiring increased processing time. Pt provided verbal, visual, and written directions on PLB, with pt unable to demonstrate understanding. Pt educated on energy conservation strategies, with handout present, however pt stating that he had a hard to understanding education provided.        Exercises Other Exercises Other Exercises: Pt educated on energy conservation strategies including pursed lip breathing, activity pacing, home/routines modifications, work simplification, AE/DME, prioritizing of meaningful occupations, and falls prevention. Handout provided, however pt verbalizing difficulty understanding education.      General Comments Upon arrival to room, pt on 4L O2 via [place] with SpO2 90%. Pt provided verbal, visual, and written directions on PLB, with pt unable to demonstrate understanding, breathing only through mouth and SpO2 remaining 88-90%. Pt instructed to breathe in AND out through nose only and SpO2 improved to 93%. After walking ~20 feet with RW on 6L of O2, SpO2 desat to 83%. SpO2 improved to 93% following instruction to breathe in/out of nose    Pertinent Vitals/ Pain       Pain Assessment: No/denies pain         Frequency  Min 1X/week        Progress Toward Goals  OT Goals(current goals can now be found in the care plan section)  Progress towards OT goals: Progressing toward goals  Acute Rehab OT Goals Patient Stated Goal: to go home OT Goal Formulation: With patient Time For Goal Achievement: 09/02/20 Potential to Achieve Goals: Fair  Plan Discharge plan remains appropriate;Frequency remains appropriate       AM-PAC OT "6 Clicks" Daily Activity     Outcome Measure   Help from another person eating meals?: None Help from another person taking care of personal grooming?: A Little Help from another person toileting, which includes using toliet, bedpan, or urinal?: A Little Help from another person bathing (including washing, rinsing, drying)?: A  Lot Help from another person to put on and taking off regular upper body clothing?: A Little Help from another person to put on and taking off regular lower body clothing?: A Lot 6 Click Score: 17    End of Session Equipment Utilized During Treatment: Gait belt;Oxygen  OT Visit Diagnosis: Muscle weakness (generalized) (M62.81);History of falling (Z91.81)   Activity Tolerance Patient tolerated treatment well   Patient Left in chair;with call bell/phone within reach;with chair alarm set;with nursing/sitter in room   Nurse Communication Mobility status;Other (comment) (SpO2)        Time: 0272-5366 OT Time Calculation (min): 41 min  Charges: OT General Charges $OT Visit: 1 Visit OT Treatments $Therapeutic Activity: 38-52 mins  Matthew Folks, OTR/L [facility] [phone]

## 2020-08-21 NOTE — Progress Notes (Signed)
Initial Nutrition Assessment  DOCUMENTATION CODES:   Severe malnutrition in context of chronic illness  INTERVENTION:   Ensure Enlive po TID, each supplement provides 350 kcal and 20 grams of protein  Magic cup TID with meals, each supplement provides 290 kcal and 9 grams of protein  MVI, folic acid and thiamine daily in setting of etoh abuse  Pt at high refeed risk; recommend monitor potassium, magnesium and phosphorus labs daily until stable  Basic low sodium diet education   NUTRITION DIAGNOSIS:   Severe Malnutrition related to chronic illness (COPD, etoh abuse) as evidenced by severe muscle depletion,severe fat depletion.  GOAL:   Patient will meet greater than or equal to 90% of their needs  MONITOR:   PO intake,Supplement acceptance,Labs,Weight trends,Skin,I & O's  REASON FOR ASSESSMENT:   Consult Assessment of nutrition requirement/status  ASSESSMENT:   60 y.o. male with medical history significant for hypertension, history of GERD, history of heart failure exacerbation, history of seizures, last seizure was approximately 2 years ago, CKD 3, hyperlipidemia, COPD, history of CVA about 2.5 years ago, alcohol abuse and tobacco abuse who presents to the emergency department for chief concerns of shortness of breath and worsening lower extremity edema.   Met with pt in room today. Pt is a poor historian but reports good appetite and oral intake at baseline. RD suspects pt with poor oral intake at baseline r/t etoh abuse. Pt is eating 100% of meals in hospital. Pt reports that he does like strawberry Ensure. RD will add supplements and vitamins to help pt meet his estimated needs. Pt is likely at high refeed risk. RD will also liberalize the heart healthy portion of pt's diet as this is restrictive of protein. Pt reports that his weight is stable pta. There is no documented weight history in chart to determine if any significant weight changes. RD educated pt today regarding  the importance of adequate protein intake needed to preserve lean muscle. Recommended supplements daily at home; pt reports that he used to drink Ensure at home but he just forgot to keep buying it. RD also provided pt with very basic low sodium diet education today focusing mainly on limiting use of the salt shaker, canned items and restaurant foods.   Medications reviewed and include: aspirin, lovenox, folic acid, lasix, protonix, thiamine  Labs reviewed: K 4.5 wnl, BUN 35(H), creat 1.52(H), Mg 1.8 wnl  NUTRITION - FOCUSED PHYSICAL EXAM:  Flowsheet Row Most Recent Value  Orbital Region Severe depletion  Upper Arm Region Severe depletion  Thoracic and Lumbar Region Severe depletion  Buccal Region Severe depletion  Temple Region Severe depletion  Clavicle Bone Region Severe depletion  Clavicle and Acromion Bone Region Severe depletion  Scapular Bone Region Severe depletion  Dorsal Hand Severe depletion  Patellar Region Severe depletion  Anterior Thigh Region Severe depletion  Posterior Calf Region Severe depletion  Edema (RD Assessment) Mild  Hair Reviewed  Eyes Reviewed  Mouth Reviewed  Skin Reviewed  Nails Reviewed     Diet Order:   Diet Order            Diet 2 gram sodium Room service appropriate? Yes; Fluid consistency: Thin  Diet effective now                EDUCATION NEEDS:   Education needs have been addressed  Skin:  Skin Assessment: Reviewed RN Assessment (ecchymosis)  Last BM:  4/14- type 6  Height:   Ht Readings from Last 1 Encounters:  08/17/20  5' 7"  (1.702 m)    Weight:   Wt Readings from Last 1 Encounters:  08/21/20 62 kg    Ideal Body Weight:  67.2 kg  BMI:  Body mass index is 21.41 kg/m.  Estimated Nutritional Needs:   Kcal:  2000-2300kcal/day  Protein:  100-115g/day  Fluid:  1.9-2.2L/day  Koleen Distance MS, RD, LDN Please refer to [facility] for RD and/or RD on-call/weekend/after hours pager

## 2020-08-21 NOTE — Progress Notes (Signed)
Mobility Specialist - Progress Note   08/21/20 1600  Mobility  Activity Transferred:  Chair to bed  Level of Assistance Moderate assist, patient does 50-74%  Assistive Device None  Distance Ambulated (ft) 2 ft  Mobility Response Tolerated well  Mobility performed by Mobility specialist  $Mobility charge 1 Mobility    During mobility: 87 HR, 86% SpO2 Post-mobility: 70 HR, 94% SpO2   Pt transferred chair-bed. ModA and extra time needed to stand from recliner. O2 desat to 86% with transfer and trended downward to 82% once seated EOB. Mobility instructed pt to engage in PLB, but poor carryover. Approx 2 minutes to get sats increased to 90%. Utilizing 4L.    Darin Hopkins Mobility Specialist 08/21/20, 4:05 PM

## 2020-08-21 NOTE — Progress Notes (Signed)
   Heart failure nurse navigator note.   Met with patient and his son.  Discussed heart failure with his son and what it meant.  Also discussed the importance of sticking with low-sodium diet and weighing daily once he goes home.  Son states that he does not live with his father that he would relate the information to his aunt whom the patient does live with.  Both the son and father reviewed the heart failure videos.  Spoke with the patient after he had watched the videos and his son was no longer at the bedside.  Asked him what he had learned from watching the videos and he stated" that I need to stay away from salt."  But did not have anything further to add beyond that   [place]

## 2020-08-21 NOTE — Consult Note (Addendum)
Cardiology Consultation:   Patient ID: UNIQUE SEARFOSS MRN: [id number]; DOB: [birth year]  Admit date: 08/17/2020 Date of Consult: 08/21/2020  PCP:  Martie Round, NP   [facility]  Cardiologist: [facility], Dr. Mariah Milling Advanced Practice Provider:  No care team member to display Electrophysiologist:  None  [id number]}    Patient Profile:   Darin Hopkins is a 60 y.o. male with a hx of HFpEF, bicuspid aortic valve with associated AI/AS and dilated aortic root /ascending aorta, HTN, HLD, COPD, seizures,  CVA, tobacco use, alcohol use, GERD, and who is being seen today for the evaluation of acute on chronic HFpEF  at the request of Dr. Nelson Chimes.  History of Present Illness:   Darin Hopkins is a 60 yo male with PMH as above.  Unfortunately, he is not best historian.  Most of the below information is provided via chart biopsy and the nursing staff.  He has no prior known h/o heart disease or arrhythmia.  He does report a history of daily alcohol use, drinking nearly 1/5/day.  He smokes 1/2 pack/day.  He is fully vaccinated for COVID-19.  He reportedly lives with his sister and brother-in-law.  Per review of chart, for the last 3 to 4 days, he reports having shortness of breath made worse with exertion.  Associated symptoms include a dry cough.  He also noted intermittent racing heart rate with exertion and at rest, as well as palpitations.  He denies any chest pain.  He reports at least 2 falls within the last 6 months.  He notes chronic lower extremity pitting edema, worsening within the last few days.  At the time of his presentation, he noted increase in swelling of his lower extremities into his groin.  He does not normally use oxygen at home, and time of his arrival in the ED, SPO2 70%.  No reported orthopnea, PND, early satiety.   In the ED, labs significant for BNP 1306.1 with high-sensitivity troponin 22, 23.  Also noted was creatinine 1.54 with BUN 41, hemoglobin 13.3,  hematocrit 44.3.  4/10 chest x-ray showed findings consistent with bronchitis or atypical pneumonia.  He was started on IV diuresis and BiPAP and admitted for [facility] HFpEF. Echo 4/11 obtained with EF 60 to 65%, mild LVH, and findings as below. RAP at that time. Urine output suboptimal and wt relatively unchanged through 4/12, after which time wt and oxygen demand increased significantly.  Specifically, [place] O2 was 2L 4/10-4/12 and net output 4/10-4/12 was less than 1L per day. On 4/12, he required increase from 2L [place] oxygen  4L [place] O2. Weight then jumped by 4kg. Along with his increased wt and oxygen demand, urine output then increased to -1.4L that day.    4/13 chest CT showed findings suggestive of underlying COPD, small bilateral pleural effusions, aortic atherosclerosis, three-vessel CAD with recommendation for further risk factor stratification/echo correlation and risk factor modification, and calcifications of the aortic valve.  Past Medical History:  Diagnosis Date  . COPD (chronic obstructive pulmonary disease) (HCC)   . Hypertension    No previously known h/o heart disease arrhythmia. No previously known family history of heart disease or arrhythmia. History reviewed. No pertinent surgical history.   Home Medications:  Prior to Admission medications   Medication Sig Start Date End Date Taking? Authorizing Provider  amLODipine (NORVASC) 10 MG tablet Take 1 tablet by mouth daily. 07/28/20  Yes [provider]  aspirin 81 MG EC tablet Take by mouth.  Yes [provider]  atorvastatin (LIPITOR) 20 MG tablet Take 1 tablet by mouth daily. 07/28/20  Yes [provider]  doxazosin (CARDURA) 2 MG tablet Take 2 mg by mouth daily. 07/28/20  Yes [provider]  folic acid (FOLVITE) 1 MG tablet Take 1 mg by mouth daily. 04/17/20  Yes [provider]  levETIRAcetam (KEPPRA) 500 MG tablet Take 500 mg by mouth 2 (two) times daily. 07/28/20  Yes [provider]  lisinopril (ZESTRIL) 5 MG tablet Take 5 mg by mouth daily. 07/28/20  Yes [provider]  omeprazole (PRILOSEC) 20 MG capsule Take 1 capsule by mouth daily. 07/28/20  Yes [provider]  PROAIR HFA 108 (90 Base) MCG/ACT inhaler Inhale into the lungs. 07/28/20  Yes [provider]  SPIRIVA HANDIHALER 18 MCG inhalation capsule 1 capsule daily. 07/28/20  Yes [provider]    Inpatient Medications: Scheduled Meds: . amLODipine  5 mg Oral QHS  . aspirin EC  81 mg Oral Daily  . atorvastatin  20 mg Oral Daily  . Chlorhexidine Gluconate Cloth  6 each Topical Q2200  . enoxaparin (LOVENOX) injection  40 mg Subcutaneous Q24H  . folic acid  1 mg Oral Daily  . furosemide  60 mg Intravenous Q12H  . Ipratropium-Albuterol  1 puff Inhalation QID  . levETIRAcetam  500 mg Oral BID  . lisinopril  5 mg Oral Daily  . metolazone  5 mg Oral Daily  . mometasone-formoterol  2 puff Inhalation BID  . multivitamin with minerals  1 tablet Oral Daily  . pantoprazole  40 mg Oral Daily  . sodium chloride flush  3 mL Intravenous Q12H  . thiamine  100 mg Oral Daily   Or  . thiamine  100 mg Intravenous Daily   Continuous Infusions: . sodium chloride     PRN Meds: sodium chloride, acetaminophen, ondansetron (ZOFRAN) IV, sodium chloride flush  Allergies:   No Known Allergies  Social History:   Social History   Socioeconomic History  . Marital status: Divorced    Spouse name: Not on file  . Number of children: Not on file  . Years of education: Not on file  . Highest education level: Not on file  Occupational History  . Not on file  Tobacco Use  . Smoking status: Current Every Day Smoker    Packs/day: 2.00    Types: Cigarettes  . Smokeless tobacco: Never Used  Vaping Use  . Vaping Use: Never used  Substance and Sexual Activity  . Alcohol use: Yes    Alcohol/week: 3.0 standard drinks    Types: 3 Cans of beer per week    Comment: daily  . Drug use:  Not Currently  . Sexual activity: Not on file  Other Topics Concern  . Not on file  Social History Narrative  . Not on file   Social Determinants of Health   Financial Resource Strain: Not on file  Food Insecurity: Not on file  Transportation Needs: Not on file  Physical Activity: Not on file  Stress: Not on file  Social Connections: Not on file  Intimate Partner Violence: Not on file    Family History:   History reviewed. No pertinent family history.   ROS:  Please see the history of present illness.  Review of Systems  Constitutional: Positive for malaise/fatigue. Negative for chills and fever.  Respiratory: Positive for cough and shortness of breath.   Cardiovascular: Positive for palpitations, leg swelling and PND. Negative for  chest pain, orthopnea and claudication.  Musculoskeletal: Positive for falls.  All other systems reviewed and are negative.   All other ROS reviewed and negative.     Physical Exam/Data:   Vitals:   08/21/20 0317 08/21/20 0521 08/21/20 0815 08/21/20 0816  BP: 107/78  (!) 142/90   Pulse: 91  99   Resp: 16  18   Temp: 98.8 F (37.1 C)  99.1 F (37.3 C)   TempSrc:   Axillary   SpO2: (!) 67%  (!) 68% 92%  Weight:  62 kg    Height:        Intake/Output Summary (Last 24 hours) at 08/21/2020 0914 Last data filed at 08/21/2020 0636 Gross per 24 hour  Intake 1120 ml  Output 2500 ml  Net -1380 ml   Last 3 Weights 08/21/2020 08/20/2020 08/20/2020  Weight (lbs) 136 lb 11 oz 137 lb 8 oz 148 lb 2.4 oz  Weight (kg) 62 kg 62.37 kg 67.2 kg     Body mass index is 21.41 kg/m.  General:  Thin male, appears older than stated age HEENT: normal. [place] O2 in place Lymph: no adenopathy Neck: JVP ~10cm Endocrine:  No thryomegaly Vascular: No carotid bruits; FA pulses 2+ bilaterally without bruits  Cardiac:  normal S1, S2; RRR; 2/6 systolic murmur RUSB Lungs: Bilateral reduced breath sounds Abd: soft, nontender, no hepatomegaly  Ext: no edema, SCDs in  place Musculoskeletal:  No deformities, BUE and BLE strength normal and equal Skin: warm and dry  Neuro:  CNs 2-12 intact, no focal abnormalities noted Psych:  Normal affect   EKG:  The EKG was personally reviewed and demonstrates:  NSR, PACs, significant artifact Telemetry:  Telemetry was personally reviewed and demonstrates: Not on telemetry  Relevant CV Studies: Echo 08/18/20 1. Left ventricular ejection fraction, by estimation, is 60 to 65%. The  left ventricle has normal function. The left ventricle has no regional  wall motion abnormalities. There is mild left ventricular hypertrophy.  Left ventricular diastolic parameters  were normal. The average left ventricular global longitudinal strain is  -15.1 %. The global longitudinal strain is abnormal.  2. Right ventricular systolic function is normal. The right ventricular  size is normal. There is normal pulmonary artery systolic pressure.  3. Left atrial size was mildly dilated.  4. Right atrial size was moderately dilated.  5. The mitral valve is normal in structure. Trivial mitral valve  regurgitation. No evidence of mitral stenosis.  6. The aortic valve is bicuspid. There is mild calcification of the  aortic valve. There is moderate thickening of the aortic valve. Aortic  valve regurgitation is mild to moderate. Moderate aortic valve stenosis.  7. Aortic dilatation noted. There is mild dilatation of the aortic root,  measuring 41 mm. There is mild dilatation of the ascending aorta,  measuring 40 mm.  8. The inferior vena cava is normal in size with greater than 50%  respiratory variability, suggesting right atrial pressure of 3 mmHg.   Laboratory Data:  High Sensitivity Troponin:   Recent Labs  Lab 08/17/20 1037 08/17/20 1225  TROPONINIHS 22* 23*     Chemistry Recent Labs  Lab 08/19/20 0415 08/20/20 0426 08/21/20 0415  NA 140 141 140  K 3.7 4.7 4.5  CL 103 104 98  CO2 29 32 35*  GLUCOSE 89 102* 103*   BUN 43* 37* 35*  CREATININE 1.63* 1.52* 1.52*  CALCIUM 8.2* 8.3* 8.4*  GFRNONAA 48* 52* 52*  ANIONGAP 8 5 7  Recent Labs  Lab 08/17/20 1037  PROT 6.9  ALBUMIN 3.6  AST 22  ALT 14  ALKPHOS 40  BILITOT 0.6   Hematology Recent Labs  Lab 08/17/20 1037 08/18/20 0329  WBC 9.9 7.1  RBC 4.28 4.09*  HGB 13.3 12.9*  HCT 44.3 41.1  MCV 103.5* 100.5*  MCH 31.1 31.5  MCHC 30.0 31.4  RDW 13.2 13.3  PLT 204 213   BNP Recent Labs  Lab 08/17/20 1037  BNP 1,306.1*    DDimer No results for input(s): DDIMER in the last 168 hours.   Radiology/Studies:  CT Chest High Resolution  Result Date: 08/20/2020 CLINICAL DATA:  60 year old male with history of hypoxemia. Evaluate for interstitial lung disease. EXAM: CT CHEST WITHOUT CONTRAST TECHNIQUE: Multidetector CT imaging of the chest was performed following the standard protocol without intravenous contrast. High resolution imaging of the lungs, as well as inspiratory and expiratory imaging, was performed. COMPARISON:  No priors. FINDINGS: Cardiovascular: Heart size is normal. There is no significant pericardial fluid, thickening or pericardial calcification. There is aortic atherosclerosis, as well as atherosclerosis of the great vessels of the mediastinum and the coronary arteries, including calcified atherosclerotic plaque in the left main, left anterior descending, left circumflex and right coronary arteries. Calcifications of the aortic valve. Mediastinum/Nodes: No pathologically enlarged mediastinal or hilar lymph nodes. Please note that accurate exclusion of hilar adenopathy is limited on noncontrast CT scans. Esophagus is unremarkable in appearance. No axillary lymphadenopathy. Lungs/Pleura: Diffuse bronchial wall thickening with mild to moderate centrilobular and paraseptal emphysema. High-resolution images demonstrate no definite regions of ground-glass attenuation, septal thickening, subpleural reticulation, traction bronchiectasis or  frank honeycombing to indicate interstitial lung disease. Inspiratory and expiratory imaging is unremarkable in appearance. Small bilateral pleural effusions with areas of dependent subsegmental atelectasis in the lower lobes of the lungs bilaterally. Upper Abdomen: Aortic atherosclerosis. Musculoskeletal: There are no aggressive appearing lytic or blastic lesions noted in the visualized portions of the skeleton. IMPRESSION: 1. No findings to suggest interstitial lung disease. 2. Diffuse bronchial wall thickening with mild to moderate centrilobular and paraseptal emphysema; imaging findings suggestive of underlying COPD. 3. Small bilateral pleural effusions with bibasilar areas of subsegmental atelectasis in the lower lobes of the lungs bilaterally. 4. Aortic atherosclerosis, in addition to left main and 3 vessel coronary artery disease. Please note that although the presence of coronary artery calcium documents the presence of coronary artery disease, the severity of this disease and any potential stenosis cannot be assessed on this non-gated CT examination. Assessment for potential risk factor modification, dietary therapy or pharmacologic therapy may be warranted, if clinically indicated. 5. There are calcifications of the aortic valve. Echocardiographic correlation for evaluation of potential valvular dysfunction may be warranted if clinically indicated. Aortic Atherosclerosis (ICD10-I70.0). Electronically Signed   By: Trudie Reed M.D.   On: 08/20/2020 20:51   DG Chest Port 1 View  Result Date: 08/19/2020 CLINICAL DATA:  Respiratory failure EXAM: PORTABLE CHEST 1 VIEW COMPARISON:  08/17/2020 FINDINGS: Cardiac shadow is enlarged but stable somewhat accentuated by the frontal technique. Increasing bibasilar opacity is noted consistent with focal infiltrate and small effusion. IMPRESSION: Increasing bibasilar airspace opacities with associated effusions. Electronically Signed   By: Alcide Clever M.D.   On:  08/19/2020 10:48   DG Chest Portable 1 View  Result Date: 08/17/2020 CLINICAL DATA:  Shortness of breath. EXAM: PORTABLE CHEST 1 VIEW COMPARISON:  November 01, 2013 FINDINGS: Cardiomediastinal silhouette is normal. Mediastinal contours appear intact. Coarsening of the interstitial markings  in the lower lobes may represent peribronchial airspace consolidation. Osseous structures are without acute abnormality. Soft tissues are grossly normal. IMPRESSION: Coarsening of the interstitial markings in the lower lobes may represent peribronchial airspace consolidation, suggestive of bronchitis or atypical pneumonia. Electronically Signed   By: Ted Mcalpine M.D.   On: 08/17/2020 10:57   ECHOCARDIOGRAM COMPLETE  Result Date: 08/18/2020    ECHOCARDIOGRAM REPORT   Patient Name:   YOVANNI FRENETTE Hanahan Date of Exam: 08/18/2020 Medical Rec #:  [id number]       Height:       67.0 in Accession #:    [id number]      Weight:       139.1 lb Date of Birth:  December 20, 1960       BSA:          1.733 m Patient Age:    59 years        BP:           109/79 mmHg Patient Gender: M               HR:           63 bpm. Exam Location:  [facility] Procedure: 2D Echo, Cardiac Doppler, Color Doppler and Strain Analysis Indications:     Dyspnea R06.00  History:         Patient has no prior history of Echocardiogram examinations.                  COPD; Risk Factors:Hypertension.  Sonographer:     Cristela Blue RDCS (AE) Referring Phys:  [id number] AMY N COX Diagnosing Phys: Yvonne Kendall MD  Sonographer Comments: Global longitudinal strain was attempted. IMPRESSIONS  1. Left ventricular ejection fraction, by estimation, is 60 to 65%. The left ventricle has normal function. The left ventricle has no regional wall motion abnormalities. There is mild left ventricular hypertrophy. Left ventricular diastolic parameters were normal. The average left ventricular global longitudinal strain is -15.1 %. The global longitudinal strain is abnormal.  2. Right ventricular  systolic function is normal. The right ventricular size is normal. There is normal pulmonary artery systolic pressure.  3. Left atrial size was mildly dilated.  4. Right atrial size was moderately dilated.  5. The mitral valve is normal in structure. Trivial mitral valve regurgitation. No evidence of mitral stenosis.  6. The aortic valve is bicuspid. There is mild calcification of the aortic valve. There is moderate thickening of the aortic valve. Aortic valve regurgitation is mild to moderate. Moderate aortic valve stenosis.  7. Aortic dilatation noted. There is mild dilatation of the aortic root, measuring 41 mm. There is mild dilatation of the ascending aorta, measuring 40 mm.  8. The inferior vena cava is normal in size with greater than 50% respiratory variability, suggesting right atrial pressure of 3 mmHg. FINDINGS  Left Ventricle: Left ventricular ejection fraction, by estimation, is 60 to 65%. The left ventricle has normal function. The left ventricle has no regional wall motion abnormalities. The average left ventricular global longitudinal strain is -15.1 %. The global longitudinal strain is abnormal. The left ventricular internal cavity size was normal in size. There is mild left ventricular hypertrophy. Left ventricular diastolic parameters were normal. Right Ventricle: The right ventricular size is normal. No increase in right ventricular wall thickness. Right ventricular systolic function is normal. There is normal pulmonary artery systolic pressure. The tricuspid regurgitant velocity is 2.16 m/s, and  with an assumed right atrial pressure of 3 mmHg, the  estimated right ventricular systolic pressure is 21.7 mmHg. Left Atrium: Left atrial size was mildly dilated. Right Atrium: Right atrial size was moderately dilated. Pericardium: There is no evidence of pericardial effusion. Mitral Valve: The mitral valve is normal in structure. Trivial mitral valve regurgitation. No evidence of mitral valve stenosis.  Tricuspid Valve: The tricuspid valve is normal in structure. Tricuspid valve regurgitation is trivial. Aortic Valve: The aortic valve is bicuspid. There is mild calcification of the aortic valve. There is moderate thickening of the aortic valve. Aortic valve regurgitation is mild to moderate. Aortic regurgitation PHT measures 475 msec. Moderate aortic stenosis is present. Aortic valve mean gradient measures 18.0 mmHg. Aortic valve peak gradient measures 30.0 mmHg. Aortic valve area, by VTI measures 1.31 cm. Pulmonic Valve: The pulmonic valve was not well visualized. Pulmonic valve regurgitation is not visualized. No evidence of pulmonic stenosis. Aorta: Aortic dilatation noted. There is mild dilatation of the aortic root, measuring 41 mm. There is mild dilatation of the ascending aorta, measuring 40 mm. Pulmonary Artery: The pulmonary artery is not well seen. Venous: The inferior vena cava is normal in size with greater than 50% respiratory variability, suggesting right atrial pressure of 3 mmHg. IAS/Shunts: The interatrial septum was not well visualized.  LEFT VENTRICLE PLAX 2D LVIDd:         5.24 cm  Diastology LVIDs:         3.07 cm  LV e' medial:    9.90 cm/s LV PW:         1.42 cm  LV E/e' medial:  11.2 LV IVS:        0.90 cm  LV e' lateral:   11.40 cm/s LVOT diam:     2.20 cm  LV E/e' lateral: 9.7 LV SV:         69 LV SV Index:   40       2D Longitudinal Strain LVOT Area:     3.80 cm 2D Strain GLS Avg:     -15.1 %  RIGHT VENTRICLE RV Basal diam:  3.77 cm RV S prime:     13.40 cm/s TAPSE (M-mode): 2.2 cm LEFT ATRIUM             Index       RIGHT ATRIUM           Index LA diam:        3.70 cm 2.13 cm/m  RA Area:     25.20 cm LA Vol (A2C):   73.3 ml 42.29 ml/m RA Volume:   91.70 ml  52.91 ml/m LA Vol (A4C):   63.9 ml 36.87 ml/m LA Biplane Vol: 70.6 ml 40.74 ml/m  AORTIC VALVE                    PULMONIC VALVE AV Area (Vmax):    1.15 cm     PV Vmax:        0.60 m/s AV Area (Vmean):   1.12 cm     PV Peak  grad:   1.4 mmHg AV Area (VTI):     1.31 cm     RVOT Peak grad: 2 mmHg AV Vmax:           274.00 cm/s AV Vmean:          197.000 cm/s AV VTI:            0.525 m AV Peak Grad:      30.0 mmHg AV Mean Grad:  18.0 mmHg LVOT Vmax:         82.90 cm/s LVOT Vmean:        58.100 cm/s LVOT VTI:          0.181 m LVOT/AV VTI ratio: 0.34 AI PHT:            475 msec  AORTA Ao Root diam: 3.83 cm MITRAL VALVE                TRICUSPID VALVE MV Area (PHT): 3.26 cm     TR Peak grad:   18.7 mmHg MV Decel Time: 233 msec     TR Vmax:        216.00 cm/s MV E velocity: 111.00 cm/s MV A velocity: 111.00 cm/s  SHUNTS MV E/A ratio:  1.00         Systemic VTI:  0.18 m                             Systemic Diam: 2.20 cm Yvonne Kendall MD Electronically signed by Yvonne Kendall MD Signature Date/Time: 08/18/2020/1:04:14 PM    Final      Assessment and Plan:   [name] HFpEF --Breathing improved but reportedly worsened mid admission, per pt. Admitted for [facility] HFpEF; however, on review of presentation and admission, it appears as if worsening volume overload occurred mid admission. He reports worsening sx over the last couple of days in the hospital, improving today. Echo 4/11 did not suggest volume overload --> nl RAP and IVC nl, not consistent with volume overload. CXR without pulmonary edema and suggestive of bronchitis. BNP was elevated with consideration of COPD. He was placed on IV diuresis with Cr showing significant bump. On 4/13, SOB reportedly suddenly increased with corresponding increase in wt and demand for O2 [place] oxygen. At this time, it does appear that he was volume overloaded. Consider that SOB for this pt is likely multifactorial in the setting of his COPD , HFpEF, and valvular dz. At this time, I also cannot rule out also some element of ischemia.    Recommend further ischemic workup as below to exclude breathing status as anginal equivalent.   Recommend also consider further evaluation of his bicuspid valve now or in  the OP setting. Consider TEE +/- RHC as below.  Continue gentle IV diuresis for now as renal function allows.   I/Os, daily wt.  Daily BMET. Cr 1.54  1.73   1.52.  Will need transition to oral lasix at discharge.    3v CAD by CT -- No chest pain reported; however, as above, cannot exclude DOE with SOB now at rest as his anginal equivalent. HS Tn this admission minimally elevated and flat trending.  EKG without acute ST/T changes with significant artifact noted. Cannot clearly see rhythm given artifact and that he is not on telemetry. EF nl and NRWMA on 4/11. Recent CT shows significant aortic atherosclerosis and  three-vessel disease with recommendation for further risk factor stratification /  RF modification. RF include tobacco use and CAD by CT. As above, breathing status likely multifactorial with known COPD, as well as his valvular dz and HF. Nevertheless, as cannot rule out DOE as anginal equivalent, further ischemic workup recommended.      Consider further ischemic workup with Lexiscan Myoview +/- R/LHC in the future.   Will place NPO after midnight and discuss with MD.   Continue ASA, CCB, statin.   Will order lipid panel and  repeat HS Tn.  Recommend start on telemetry.   Aggressive risk factor modification.   Cholesterol, HR, BP control. Continue statin.   Smoking / EtOH cessation recommended.  Bicuspid aortic valve/aortic valve disease aortic root/ascending aorta dilation -- Likely contributing to overall presentation. 08/18/2020 echo shows bicuspid aortic valve with moderate thickening of the aortic valve, AR mild, moderate AS.  Consider is contributing to his overall presentation as above.  Consider further evaluation with TEE +/- right and left heart catheterization as above. Avoid fluoroquinolones and heavy lifting. HR, BP, cholesterol control recommended. Will need annual imaging to monitor.  Hypertension --BP not well controlled --Titrate medications as needed for  BP control --Continue IV diuresis.   Tobacco/EtOH use --Cessation recommended.   For questions or updates, please contact [facility] Please consult [email or web address] for contact info under    Signed, Lennon Alstrom, PA-C  08/21/2020 9:14 AM

## 2020-08-22 ENCOUNTER — Encounter: Payer: Self-pay | Admitting: Internal Medicine

## 2020-08-22 ENCOUNTER — Inpatient Hospital Stay (HOSPITAL_COMMUNITY): Payer: Medicaid Other

## 2020-08-22 DIAGNOSIS — R079 Chest pain, unspecified: Secondary | ICD-10-CM

## 2020-08-22 DIAGNOSIS — R42 Dizziness and giddiness: Secondary | ICD-10-CM

## 2020-08-22 LAB — BASIC METABOLIC PANEL
Anion gap: 7 (ref 5–15)
BUN: 47 mg/dL — ABNORMAL HIGH (ref 6–20)
CO2: 40 mmol/L — ABNORMAL HIGH (ref 22–32)
Calcium: 8.5 mg/dL — ABNORMAL LOW (ref 8.9–10.3)
Chloride: 92 mmol/L — ABNORMAL LOW (ref 98–111)
Creatinine, Ser: 1.64 mg/dL — ABNORMAL HIGH (ref 0.61–1.24)
GFR, Estimated: 48 mL/min — ABNORMAL LOW (ref >60)
Glucose, Bld: 105 mg/dL — ABNORMAL HIGH (ref 70–99)
Potassium: 4.7 mmol/L (ref 3.5–5.1)
Sodium: 139 mmol/L (ref 135–145)

## 2020-08-22 LAB — NM MYOCAR MULTI W/SPECT W/WALL MOTION / EF
Estimated workload: 1 METS
Exercise duration (min): 0 min
Exercise duration (sec): 0 s
LV dias vol: 132 mL (ref 62–150)
LV sys vol: 61 mL
MPHR: 161 {beats}/min
Peak HR: 88 {beats}/min
Percent HR: 54 %
Rest HR: 73 {beats}/min
TID: 0.98

## 2020-08-22 LAB — TROPONIN I (HIGH SENSITIVITY): Troponin I (High Sensitivity): 44 ng/L — ABNORMAL HIGH (ref <18)

## 2020-08-22 LAB — MAGNESIUM: Magnesium: 2 mg/dL (ref 1.7–2.4)

## 2020-08-22 MED ORDER — FUROSEMIDE 10 MG/ML IJ SOLN
60.0000 mg | Freq: Every day | INTRAMUSCULAR | Status: DC
  Administered 2020-08-22: 60 mg via INTRAVENOUS
  Filled 2020-08-22: qty 6

## 2020-08-22 MED ORDER — REGADENOSON 0.4 MG/5ML IV SOLN
0.4000 mg | Freq: Once | INTRAVENOUS | Status: AC
  Administered 2020-08-22: 0.4 mg via INTRAVENOUS

## 2020-08-22 MED ORDER — TECHNETIUM TC 99M TETROFOSMIN IV KIT
10.0000 | PACK | Freq: Once | INTRAVENOUS | Status: AC | PRN
  Administered 2020-08-22: 10.69 via INTRAVENOUS

## 2020-08-22 MED ORDER — FUROSEMIDE 10 MG/ML IJ SOLN
40.0000 mg | Freq: Every day | INTRAMUSCULAR | Status: DC
Start: 2020-08-23 — End: 2020-08-23

## 2020-08-22 MED ORDER — TECHNETIUM TC 99M TETROFOSMIN IV KIT
32.2800 | PACK | Freq: Once | INTRAVENOUS | Status: AC | PRN
  Administered 2020-08-22: 32.28 via INTRAVENOUS

## 2020-08-22 NOTE — Progress Notes (Addendum)
Progress Note  Patient Name: Darin Hopkins Date of Encounter: 08/22/2020  Primary Cardiologist:[facility], Dr. Mariah MillingGollan  Subjective   Seen and evaluated during MPI this AM.  No CP or SOB.  Slightly dizzy during stress, resolving during recovery.  Inpatient Medications    Scheduled Meds: . amLODipine  10 mg Oral QHS  . aspirin EC  81 mg Oral Daily  . atorvastatin  20 mg Oral Daily  . Chlorhexidine Gluconate Cloth  6 each Topical Q2200  . enoxaparin (LOVENOX) injection  40 mg Subcutaneous Q24H  . feeding supplement  237 mL Oral TID BM  . folic acid  1 mg Oral Daily  . furosemide  60 mg Intravenous Daily  . Ipratropium-Albuterol  1 puff Inhalation QID  . levETIRAcetam  500 mg Oral BID  . lisinopril  5 mg Oral Daily  . metolazone  5 mg Oral Daily  . mometasone-formoterol  2 puff Inhalation BID  . multivitamin with minerals  1 tablet Oral Daily  . pantoprazole  40 mg Oral Daily  . sodium chloride flush  3 mL Intravenous Q12H  . thiamine  100 mg Oral Daily   Or  . thiamine  100 mg Intravenous Daily   Continuous Infusions: . sodium chloride     PRN Meds: sodium chloride, acetaminophen, ondansetron (ZOFRAN) IV, sodium chloride flush   Vital Signs    Vitals:   08/21/20 1917 08/22/20 0500 08/22/20 0549 08/22/20 0723  BP: 107/69  139/85 138/85  Pulse: 70  90 98  Resp: 18  18 18   Temp: 97.8 F (36.6 C)  98.3 F (36.8 C) 99.7 F (37.6 C)  TempSrc:   Oral Oral  SpO2: 96%  94% 91%  Weight:  62.7 kg    Height:        Intake/Output Summary (Last 24 hours) at 08/22/2020 0847 Last data filed at 08/22/2020 0845 Gross per 24 hour  Intake 1083 ml  Output 1450 ml  Net -367 ml   Last 3 Weights 08/22/2020 08/21/2020 08/20/2020  Weight (lbs) 138 lb 4.8 oz 136 lb 11 oz 137 lb 8 oz  Weight (kg) 62.732 kg 62 kg 62.37 kg      Telemetry    Still not on telemetry - Personally Reviewed  ECG    No new tracings - Personally Reviewed  Physical Exam   GEN: Thin male, appears  older than stated age   Neck: No JVD Cardiac: RRR, 2/6 systolic murmur, 1/4 diastolic murmur. No, rubs, or gallops.  Respiratory: Bilaterally reduced breath sounds, worse at bases. GI: Soft, nontender, non-distended  MS: No edema; No deformity. Neuro:  Nonfocal  Psych: Normal affect   Labs    High Sensitivity Troponin:   Recent Labs  Lab 08/17/20 1037 08/17/20 1225 08/21/20 1114  TROPONINIHS 22* 23* 41*      Chemistry Recent Labs  Lab 08/17/20 1037 08/18/20 0329 08/20/20 0426 08/21/20 0415 08/22/20 0426  NA 146*   < > 141 140 139  K 4.8   < > 4.7 4.5 4.7  CL 112*   < > 104 98 92*  CO2 27   < > 32 35* 40*  GLUCOSE 103*   < > 102* 103* 105*  BUN 41*   < > 37* 35* 47*  CREATININE 1.54*   < > 1.52* 1.52* 1.64*  CALCIUM 8.4*   < > 8.3* 8.4* 8.5*  PROT 6.9  --   --   --   --   ALBUMIN 3.6  --   --   --   --  AST 22  --   --   --   --   ALT 14  --   --   --   --   ALKPHOS 40  --   --   --   --   BILITOT 0.6  --   --   --   --   GFRNONAA 52*   < > 52* 52* 48*  ANIONGAP 7   < > 5 7 7    < > = values in this interval not displayed.     Hematology Recent Labs  Lab 08/17/20 1037 08/18/20 0329  WBC 9.9 7.1  RBC 4.28 4.09*  HGB 13.3 12.9*  HCT 44.3 41.1  MCV 103.5* 100.5*  MCH 31.1 31.5  MCHC 30.0 31.4  RDW 13.2 13.3  PLT 204 213    BNP Recent Labs  Lab 08/17/20 1037  BNP 1,306.1*     DDimer No results for input(s): DDIMER in the last 168 hours.   Radiology    CT Chest High Resolution  Result Date: 08/20/2020 CLINICAL DATA:  60 year old male with history of hypoxemia. Evaluate for interstitial lung disease. EXAM: CT CHEST WITHOUT CONTRAST TECHNIQUE: Multidetector CT imaging of the chest was performed following the standard protocol without intravenous contrast. High resolution imaging of the lungs, as well as inspiratory and expiratory imaging, was performed. COMPARISON:  No priors. FINDINGS: Cardiovascular: Heart size is normal. There is no significant  pericardial fluid, thickening or pericardial calcification. There is aortic atherosclerosis, as well as atherosclerosis of the great vessels of the mediastinum and the coronary arteries, including calcified atherosclerotic plaque in the left main, left anterior descending, left circumflex and right coronary arteries. Calcifications of the aortic valve. Mediastinum/Nodes: No pathologically enlarged mediastinal or hilar lymph nodes. Please note that accurate exclusion of hilar adenopathy is limited on noncontrast CT scans. Esophagus is unremarkable in appearance. No axillary lymphadenopathy. Lungs/Pleura: Diffuse bronchial wall thickening with mild to moderate centrilobular and paraseptal emphysema. High-resolution images demonstrate no definite regions of ground-glass attenuation, septal thickening, subpleural reticulation, traction bronchiectasis or frank honeycombing to indicate interstitial lung disease. Inspiratory and expiratory imaging is unremarkable in appearance. Small bilateral pleural effusions with areas of dependent subsegmental atelectasis in the lower lobes of the lungs bilaterally. Upper Abdomen: Aortic atherosclerosis. Musculoskeletal: There are no aggressive appearing lytic or blastic lesions noted in the visualized portions of the skeleton. IMPRESSION: 1. No findings to suggest interstitial lung disease. 2. Diffuse bronchial wall thickening with mild to moderate centrilobular and paraseptal emphysema; imaging findings suggestive of underlying COPD. 3. Small bilateral pleural effusions with bibasilar areas of subsegmental atelectasis in the lower lobes of the lungs bilaterally. 4. Aortic atherosclerosis, in addition to left main and 3 vessel coronary artery disease. Please note that although the presence of coronary artery calcium documents the presence of coronary artery disease, the severity of this disease and any potential stenosis cannot be assessed on this non-gated CT examination. Assessment  for potential risk factor modification, dietary therapy or pharmacologic therapy may be warranted, if clinically indicated. 5. There are calcifications of the aortic valve. Echocardiographic correlation for evaluation of potential valvular dysfunction may be warranted if clinically indicated. Aortic Atherosclerosis (ICD10-I70.0). Electronically Signed   By: 46 M.D.   On: 08/20/2020 20:51    Cardiac Studies   Echo 08/18/20 1. Left ventricular ejection fraction, by estimation, is 60 to 65%. The  left ventricle has normal function. The left ventricle has no regional  wall motion abnormalities. There is mild  left ventricular hypertrophy.  Left ventricular diastolic parameters  were normal. The average left ventricular global longitudinal strain is  -15.1 %. The global longitudinal strain is abnormal.  2. Right ventricular systolic function is normal. The right ventricular  size is normal. There is normal pulmonary artery systolic pressure.  3. Left atrial size was mildly dilated.  4. Right atrial size was moderately dilated.  5. The mitral valve is normal in structure. Trivial mitral valve  regurgitation. No evidence of mitral stenosis.  6. The aortic valve is bicuspid. There is mild calcification of the  aortic valve. There is moderate thickening of the aortic valve. Aortic  valve regurgitation is mild to moderate. Moderate aortic valve stenosis.  7. Aortic dilatation noted. There is mild dilatation of the aortic root,  measuring 41 mm. There is mild dilatation of the ascending aorta,  measuring 40 mm.  8. The inferior vena cava is normal in size with greater than 50%  respiratory variability, suggesting right atrial pressure of 3 mmHg.   Patient Profile     60 y.o. male with a hx of HFpEF, bicuspid aortic valve with associated AI/AS and dilated aortic root /ascending aorta, HTN, HLD, COPD, seizures,  CVA, tobacco use, alcohol use, GERD, and who is being seen today for  the evaluation of acute on chronic HFpEF.  Assessment & Plan    [facility] HFpEF Acute respiratory distress with hypoxia Known COPD --Breathing worsened 4/12 per pt. Echo 4/11 did not suggest volume overload --> nl RAP and IVC nl, not consistent with volume overload. CXR without pulmonary edema. BNP was elevated with known COPD. He was placed on IV diuresis with Cr  bump. On 4/12 or 4/13, breathing worsened and metolazone added with improved output. Suspect SOB multifactorial in the setting of his COPD , HFpEF, and valvular dz as Hopkins. Considered also was DOE as anginal equivalent with workup recommended to rule out ischemia as Hopkins.      Hold IV lasix today, given bumped Cr.   Decreased IV diuresis to IV lasix 40mg  daily thereafter.   Discontinued metolazone given bump in Cr.   Closely monitor volume status. Appears euvolemic today with Cr bumped. ? I/Os, daily wt. Net -3.5L for admission and -192cc yesterday. ? Daily BMET. Cr 1.52  1.64 with BUN 35  40.    Continue inhalers / respiratory treatments.  MPI as Hopkins for ischemic workup.  3v CAD by CT -- No chest pain reported; however, as above, cannot exclude DOE with SOB now at rest as his anginal equivalent. HS Tn this admission minimally elevated and flat trending.  EKG without acute ST/T changes with significant artifact noted.  EF nl and NRWMA on 4/11. RF include tobacco use and 3v CAD by CT. As above, breathing status likely multifactorial; however, as cannot rule out DOE as anginal equivalent, further ischemic workup recommended.      Continue ASA, CCB, statin.   Lipid panel ordered with LDL 93. Repeat Tn 41.  Aggressive risk factor modification.   ? Cholesterol, HR, BP control. Continue statin.  ? Smoking / EtOH cessation recommended.  MPI today - further recommendations pending these results.  Bicuspid aortic valve/aortic valve disease aortic root/ascending aorta dilation -- Likely contributing to overall presentation.  08/18/2020 echo shows bicuspid aortic valve with moderate thickening of the aortic valve, AR mild, moderate AS.  Consider is contributing to his overall presentation as above.  Consider further evaluation with TEE +/- right and left heart catheterization as above. Avoid  fluoroquinolones and heavy lifting. HR, BP, cholesterol control recommended. Will need annual imaging to monitor.  Hypertension --BP not well controlled --Titrate medications as needed for BP control --Continue IV diuresis.   Tobacco/EtOH use --Cessation recommended.   For questions or updates, please contact [facility] Please consult [email or web address] for contact info under        Signed, Lennon Alstrom, PA-C  08/22/2020, 8:47 AM

## 2020-08-22 NOTE — Plan of Care (Signed)
  Problem: Health Behavior/Discharge Planning: Goal: Ability to manage health-related needs will improve Outcome: Progressing   Problem: Clinical Measurements: Goal: Respiratory complications will improve Outcome: Progressing   Problem: Activity: Goal: Risk for activity intolerance will decrease Outcome: Progressing   Problem: Elimination: Goal: Will not experience complications related to bowel motility Outcome: Progressing   Problem: Elimination: Goal: Will not experience complications related to urinary retention Outcome: Progressing

## 2020-08-22 NOTE — Progress Notes (Signed)
PROGRESS NOTE    Darin KoyanagiWillie L Hopkins  ONG:[id number] DOB: 1960/05/11 DOA: 08/17/2020 PCP: Martie RoundSpencer, Nicole, NP   Brief Narrative: Taken from prior notes. 60 y.o.malewith medical history significant forhypertension, history of GERD, history of heart failure exacerbation, history of seizures, last seizure was approximately 2 years ago, CKD 3, hyperlipidemia, COPD,history of CVA about 2.5 years ago, alcohol abuse, tobacco abuse,presents to the emergency department for chief concerns of shortness of breath and worsening lower extremity edema. He was compliant with his medications though he has not seen a doctor in 2 years. BNP elevated at 1306.  Echocardiogram with normal EF and normal diastolic function. He was hypoxic on admission, requiring initially BiPAP.  Currently on 2 to 4 L of oxygen.  No prior oxygen use.  Chest x-ray with airspace infiltrates bilaterally suspicious for pulmonary edema.  Repeat chest x-ray with peribronchial thickening and concern for atypical pneumonia.  Patient remained afebrile and no leukocytosis. Patient underwent Myoview on 08/22/20, it was  low risk, probably normal with small basal inferior fixed defect most likely representing artifact but unable to exclude a small region of scar.   Cardiology does not think that coronary insufficiency is contributing to his dyspnea and they were recommending transitioning to p.o. Lasix from tomorrow with anticipation of discharge in a day. Patient will need outpatient pulmonology evaluation for his COPD.  Subjective: Patient was seen and examined after Myoview today.  No new complaint.  Denies any chest pain or dyspnea while lying down comfortably.  Able to sleep flat.  Assessment & Plan:   Principal Problem:   Acute exacerbation of CHF (congestive heart failure) (HCC) Active Problems:   Essential hypertension   History of seizure   COPD (chronic obstructive pulmonary disease) (HCC)   Hyperlipidemia   GERD (gastroesophageal  reflux disease)   Alcohol abuse   Acute decompensated heart failure (HCC)   Acute respiratory failure with hypoxia (HCC)   Protein-calorie malnutrition, severe  Acute hypoxic respiratory failure most likely secondary to acute diastolic heart failure. Elevated BNP and pulmonary and lower extremity edema.  Echocardiogram within normal limit.  Some improvement in diuresis after adding metolazone with Lasix Total net of -4000. -CT chest for further evaluation of bilateral infiltrates-shows concern of COPD, no interstitial lung disease and bilateral small pleural effusions. -Procalcitonin-negative -Decrease Lasix to once daily due to increasing creatinine and azotemia -Hold metolazone -Continue with daily BMP and weight -Strict intake and output -Cardiology consult-going for Myoview tomorrow. -Supplemental oxygen to keep the saturation above 90%.  Elevated troponin.  No chest pain and EKG without any acute changes.  Most likely secondary to demand ischemia with heart failure exacerbation. -Underwent Myoview which was low risk. -Cardiology does not need that he needs any more investigation and coronary insufficiency is contributing to his dyspnea.  Hypertension.  Blood pressure within goal. -Continue home dose of lisinopril and amlodipine. -Keep holding home dose of HCTZ and hydralazine. -Patient is also on IV Lasix.  CKD stage IIIb.  Baseline creatinine appears to be around 1.5-1.7.  Small increase in creatinine to 1.64 today. -Monitor renal function while patient is being diuresed. -Avoid nephrotoxins  COPD.  Does not appear to be in exacerbation. -Continue home inhalers and bronchodilators  History of seizure disorder.  Last known seizure was approximately 2 years ago. -Continue with Keppra 500 mg twice daily -Continue with seizure precautions  Alcohol abuse. drinks 1/5 of liquor per day per sister, CIWA protocal initiated No evidence of clinical withdrawals at this  time. -Continue  with CIWA protocol -Continue counseling for alcohol cessation  Tobacco abuse. -Nicotine patch  Hyperlipidemia. Continue with atorvastatin.  History of CVA. -Continue with home dose of aspirin and statin  GERD. -Continue home dose of omeprazole.  Malnourishment.  Patient appears malnourished.  Albumin within normal limit. Estimated body mass index is 21.66 kg/m as calculated from the following:   Height as of this encounter:  (1.702 m).   Weight as of this encounter: 62.7 kg.  -Dietitian consult  Physical deconditioning. -PT is recommending SNF placement-unable to find any SNF who accept his insurance. Most likely will be going home with home health services as TOC find one company who can provide RN and PT. sister will be helping.  Objective: Vitals:   08/22/20 0500 08/22/20 0549 08/22/20 0723 08/22/20 1218  BP:  139/85 138/85 124/79  Pulse:  90 98 82  Resp:  Temp:  98.3 F (36.8 C) 99.7 F (37.6 C) 98.2 F (36.8 C)  TempSrc:  Oral Oral Oral  SpO2:  94% 91% 97%  Weight: 62.7 kg     Height:        Intake/Output Summary (Last 24 hours) at 08/22/2020 1557 Last data filed at 08/22/2020 1412 Gross per 24 hour  Intake 840 ml  Output 1650 ml  Net -810 ml   Filed Weights   08/20/20 1113 08/21/20 0521 08/22/20 0500  Weight: 62.4 kg 62 kg 62.7 kg    Examination:  General.  Chronically ill-appearing, malnourished gentleman, in no acute distress. Pulmonary.  Lungs clear bilaterally, diminished breath sounds bilaterally, normal respiratory effort. CV.  Regular rate and rhythm, no JVD, rub or murmur. Abdomen.  Soft, nontender, nondistended, BS positive. CNS.  Alert and oriented x3.  No focal neurologic deficit. Extremities.  No edema, no cyanosis, pulses intact and symmetrical. Psychiatry.  Judgment and insight appears normal.   DVT prophylaxis: Lovenox Code Status: Full. Family Communication: Discussed with patient. Disposition  Plan:  Status is: Inpatient  Remains inpatient appropriate because:Inpatient level of care appropriate due to severity of illness   Dispo: The patient is from: Home              Anticipated d/c is to: Home              Patient currently is not medically stable to d/c.   Difficult to place patient Yes               Level of care: Progressive Cardiac  All the records are reviewed and case discussed with Care Management/Social Worker. Management plans discussed with the patient, nursing and they are in agreement.  Consultants:   Cardiology  Procedures:  Antimicrobials:   Data Reviewed: I have personally reviewed following labs and imaging studies  CBC: Recent Labs  Lab 08/17/20 1037 08/18/20 0329  WBC 9.9 7.1  NEUTROABS 4.2 3.0  HGB 13.3 12.9*  HCT 44.3 41.1  MCV 103.5* 100.5*  PLT 204 213   Basic Metabolic Panel: Recent Labs  Lab 08/17/20 1418 08/18/20 0329 08/19/20 0415 08/20/20 0426 08/21/20 0415 08/22/20 0426  NA  --  143 140 141 140 139  K  --  4.7 3.7 4.7 4.5 4.7  CL  --  107 103 104 98 92*  CO2  --  30 29 32 35* 40*  GLUCOSE  --  93 89 102* 103* 105*  BUN  --  43* 43* 37* 35* 47*  CREATININE  --  1.73* 1.63* 1.52* 1.52* 1.64*  CALCIUM  --  8.4* 8.2* 8.3* 8.4* 8.5*  MG 1.9  --   --  1.8 1.8 2.0  PHOS 3.6  --   --   --   --   --    GFR: Estimated Creatinine Clearance: 43 mL/min (A) (by C-G formula based on SCr of 1.64 mg/dL (H)). Liver Function Tests: Recent Labs  Lab 08/17/20 1037  AST 22  ALT 14  ALKPHOS 40  BILITOT 0.6  PROT 6.9  ALBUMIN 3.6   No results for input(s): LIPASE, AMYLASE in the last 168 hours. No results for input(s): AMMONIA in the last 168 hours. Coagulation Profile: No results for input(s): INR, PROTIME in the last 168 hours. Cardiac Enzymes: No results for input(s): CKTOTAL, CKMB, CKMBINDEX, TROPONINI in the last 168 hours. BNP (last 3 results) No results for input(s): PROBNP in the last 8760 hours. HbA1C: No results  for input(s): HGBA1C in the last 72 hours. CBG: Recent Labs  Lab 08/17/20 1342  GLUCAP 110*   Lipid Profile: Recent Labs    08/21/20 1114  CHOL 179  HDL 75  LDLCALC 93  TRIG 55  CHOLHDL 2.4   Thyroid Function Tests: No results for input(s): TSH, T4TOTAL, FREET4, T3FREE, THYROIDAB in the last 72 hours. Anemia Panel: No results for input(s): VITAMINB12, FOLATE, FERRITIN, TIBC, IRON, RETICCTPCT in the last 72 hours. Sepsis Labs: Recent Labs  Lab 08/20/20 0428  PROCALCITON <0.10    Recent Results (from the past 240 hour(s))  Resp Panel by RT-PCR (Flu A&B, Covid) Nasopharyngeal Swab     Status: None   Collection Time: 08/17/20 10:38 AM   Specimen: Nasopharyngeal Swab; Nasopharyngeal(NP) swabs in vial transport medium  Result Value Ref Range Status   SARS Coronavirus 2 by RT PCR NEGATIVE NEGATIVE Final    Comment: (NOTE) SARS-CoV-2 target nucleic acids are NOT DETECTED.  The SARS-CoV-2 RNA is generally detectable in upper respiratory specimens during the acute phase of infection. The lowest concentration of SARS-CoV-2 viral copies this assay can detect is 138 copies/mL. A negative result does not preclude SARS-Cov-2 infection and should not be used as the sole basis for treatment or other patient management decisions. A negative result may occur with  improper specimen collection/handling, submission of specimen other than nasopharyngeal swab, presence of viral mutation(s) within the areas targeted by this assay, and inadequate number of viral copies(<138 copies/mL). A negative result must be combined with clinical observations, patient history, and epidemiological information. The expected result is Negative.  Fact Sheet for Patients:  BloggerCourse.com  Fact Sheet for Healthcare Providers:  SeriousBroker.it  This test is no t yet approved or cleared by the [place] FDA and  has been authorized for detection  and/or diagnosis of SARS-CoV-2 by FDA under an Emergency Use Authorization (EUA). This EUA will remain  in effect (meaning this test can be used) for the duration of the COVID-19 declaration under Section 564(b)(1) of the Act, 21 U.S.C.section 360bbb-3(b)(1), unless the authorization is terminated  or revoked sooner.       Influenza A by PCR NEGATIVE NEGATIVE Final   Influenza B by PCR NEGATIVE NEGATIVE Final    Comment: (NOTE) The Xpert Xpress SARS-CoV-2/FLU/RSV plus assay is intended as an aid in the diagnosis of influenza from Nasopharyngeal swab specimens and should not be used as a sole basis for treatment. Nasal washings and aspirates are unacceptable for Xpert Xpress SARS-CoV-2/FLU/RSV testing.  Fact Sheet for Patients: BloggerCourse.com  Fact Sheet for Healthcare Providers: SeriousBroker.it  This test  is not yet approved or cleared by the [place] and has been authorized for detection and/or diagnosis of SARS-CoV-2 by FDA under an Emergency Use Authorization (EUA). This EUA will remain in effect (meaning this test can be used) for the duration of the COVID-19 declaration under Section 564(b)(1) of the Act, 21 U.S.C. section 360bbb-3(b)(1), unless the authorization is terminated or revoked.  Performed at [facility], [address] [place], [place] [address]   MRSA PCR Screening     Status: None   Collection Time: 08/17/20  1:45 PM   Specimen: Nasal Mucosa; Nasopharyngeal  Result Value Ref Range Status   MRSA by PCR NEGATIVE NEGATIVE Final    Comment:        The GeneXpert MRSA Assay (FDA approved for NASAL specimens only), is one component of a comprehensive MRSA colonization surveillance program. It is not intended to diagnose MRSA infection nor to guide or monitor treatment for MRSA infections. Performed at [facility], [address] [place], [place] [address]       Radiology Studies: CT Chest High Resolution  Result Date: 08/20/2020 CLINICAL DATA:  60 year old male with history of hypoxemia. Evaluate for interstitial lung disease. EXAM: CT CHEST WITHOUT CONTRAST TECHNIQUE: Multidetector CT imaging of the chest was performed following the standard protocol without intravenous contrast. High resolution imaging of the lungs, as well as inspiratory and expiratory imaging, was performed. COMPARISON:  No priors. FINDINGS: Cardiovascular: Heart size is normal. There is no significant pericardial fluid, thickening or pericardial calcification. There is aortic atherosclerosis, as well as atherosclerosis of the great vessels of the mediastinum and the coronary arteries, including calcified atherosclerotic plaque in the left main, left anterior descending, left circumflex and right coronary arteries. Calcifications of the aortic valve. Mediastinum/Nodes: No pathologically enlarged mediastinal or hilar lymph nodes. Please note that accurate exclusion of hilar adenopathy is limited on noncontrast CT scans. Esophagus is unremarkable in appearance. No axillary lymphadenopathy. Lungs/Pleura: Diffuse bronchial wall thickening with mild to moderate centrilobular and paraseptal emphysema. High-resolution images demonstrate no definite regions of ground-glass attenuation, septal thickening, subpleural reticulation, traction bronchiectasis or frank honeycombing to indicate interstitial lung disease. Inspiratory and expiratory imaging is unremarkable in appearance. Small bilateral pleural effusions with areas of dependent subsegmental atelectasis in the lower lobes of the lungs bilaterally. Upper Abdomen: Aortic atherosclerosis. Musculoskeletal: There are no aggressive appearing lytic or blastic lesions noted in the visualized portions of the skeleton. IMPRESSION: 1. No findings to suggest interstitial lung disease. 2. Diffuse bronchial wall thickening with mild to moderate centrilobular and  paraseptal emphysema; imaging findings suggestive of underlying COPD. 3. Small bilateral pleural effusions with bibasilar areas of subsegmental atelectasis in the lower lobes of the lungs bilaterally. 4. Aortic atherosclerosis, in addition to left main and 3 vessel coronary artery disease. Please note that although the presence of coronary artery calcium documents the presence of coronary artery disease, the severity of this disease and any potential stenosis cannot be assessed on this non-gated CT examination. Assessment for potential risk factor modification, dietary therapy or pharmacologic therapy may be warranted, if clinically indicated. 5. There are calcifications of the aortic valve. Echocardiographic correlation for evaluation of potential valvular dysfunction may be warranted if clinically indicated. Aortic Atherosclerosis (ICD10-I70.0). Electronically Signed   By: Trudie Reed M.D.   On: 08/20/2020 20:51   NM Myocar Multi W/Spect W/Wall Motion / EF  Result Date: 08/22/2020  Low risk, probably normal pharmacologic myocardial perfusion stress test.  There is a small  in size, moderate in severity, fixed basal inferior defect most likely representing artifact but cannot rule out a small area of scar.  There is no evidence of significant ischemia.  The left ventricular ejection fraction is normal (64%).  Coronary artery calcifications are present. Small bilateral pleural effusion are noted on the attenuation correction CT.     Scheduled Meds: . amLODipine  10 mg Oral QHS  . aspirin EC  81 mg Oral Daily  . atorvastatin  20 mg Oral Daily  . Chlorhexidine Gluconate Cloth  6 each Topical Q2200  . enoxaparin (LOVENOX) injection  40 mg Subcutaneous Q24H  . feeding supplement  237 mL Oral TID BM  . folic acid  1 mg Oral Daily  . [START ON 08/23/2020] furosemide  40 mg Intravenous Daily  . Ipratropium-Albuterol  1 puff Inhalation QID  . levETIRAcetam  500 mg Oral BID  . lisinopril  5 mg Oral  Daily  . mometasone-formoterol  2 puff Inhalation BID  . multivitamin with minerals  1 tablet Oral Daily  . pantoprazole  40 mg Oral Daily  . sodium chloride flush  3 mL Intravenous Q12H  . thiamine  100 mg Oral Daily   Or  . thiamine  100 mg Intravenous Daily   Continuous Infusions: . sodium chloride       LOS: 4 days   Time spent: 30 minutes. More than 50% of the time was spent in counseling/coordination of care  Arnetha Courser, MD [facility]  If 7PM-7AM, please contact night-coverage [email or web address]  08/22/2020, 3:57 PM   This record has been created using Conservation officer, historic buildings. Errors have been sought and corrected,but may not always be located. Such creation errors do not reflect on the standard of care.

## 2020-08-22 NOTE — Progress Notes (Addendum)
Occupational Therapy Treatment Patient Details Name: Darin Hopkins MRN: [id number] DOB: [birth year] Today's Date: 08/22/2020    History of present illness Patient is a 60 y.o. male with medical history significant for hypertension, history of GERD, history of heart failure exacerbation, history of seizures, last seizure was approximately 2 years ago, CKD 3, hyperlipidemia, COPD, history of CVA about 2.5 years ago, alcohol abuse, tobacco abuse, presents to the emergency department for chief concerns of shortness of breath. Found to have Acute decompensated diastolic heart failure and Acute hypoxic respiratory failure   OT comments  Pt seen for OT treatment on this date. Upon arrival to room, pt awake seated upright in bed on 4L of O2, with SpO2 91%. Pt oriented to self, place, month/year, and some aspects of situation. Pt agreeable to tx. Pt re-educated on   energy conversation strategies (i.e., activity pacing, positioning, and pursed lip breathing), with pt able to recall 1/3 strategies at end of session. Pt currently presents with decreased activity tolerance, strength, and balance, and requires SUPERVISION/SET-UP for seated UB dressing, MIN A for LB dressing, and MIN GUARD for functional mobility of short household distances (~20 feet) with RW. Pt is making good progress towards goals, however continues to have difficulty sequencing pursed lip breathing, with O2 desat to 82% following functional mobility. SpO2 able to recover to 92% after 2 min seated rest break and MAX cues from OT for breathing through nose; RN informed and pt left in recliner on 4L of O2 with SpO2 92% and pt in no acute distress. Pt continues to benefit from skilled OT services to maximize return to PLOF and minimize risk of future falls, injury, caregiver burden, and readmission. Will continue to follow POC. Discharge recommendation of SNF remains appropriate. If pt is to return home, recommend home health OT and supervision with  OOB mobility.     Equipment Recommendations  3 in 1 bedside commode;Tub/shower bench       Precautions / Restrictions Precautions Precautions: Fall Restrictions Weight Bearing Restrictions: No       Mobility Bed Mobility Overal bed mobility: Needs Assistance Bed Mobility: Supine to Sit;Sit to Supine     Supine to sit: Supervision Sit to supine: Supervision   General bed mobility comments: Requires increased time and efforts    Transfers Overall transfer level: Needs assistance Equipment used: Rolling walker (2 wheeled) Transfers: Sit to/from Stand Sit to Stand: Min guard              Balance Overall balance assessment: Needs assistance;History of Falls Sitting-balance support: No upper extremity supported;Feet supported Sitting balance-Leahy Scale: Good Sitting balance - Comments: EOB while performing seated dressing   Standing balance support: During functional activity;Bilateral upper extremity supported Standing balance-Leahy Scale: Fair Standing balance comment: CGA and UE support from RW                           ADL either performed or assessed with clinical judgement   ADL Overall ADL's : Needs assistance/impaired                 Upper Body Dressing : Supervision/safety;Set up;Sitting Upper Body Dressing Details (indicate cue type and reason): to don/doff hospital gown while sitting EOB Lower Body Dressing: Minimal assistance;Sitting/lateral leans Lower Body Dressing Details (indicate cue type and reason): To don/doff socks. Required x1 seated reast break and MIN A d/t  fatigue  Functional mobility during ADLs: Min guard;Rolling walker (~20 feet)                 Cognition Arousal/Alertness: Awake/alert Behavior During Therapy: WFL for tasks assessed/performed Overall Cognitive Status: No family/caregiver present to determine baseline cognitive functioning                                  General Comments: Alert and oriented to self, place, month/year, and some aspects of situations. Required MAX verbal cues to breathe through nose during session        Exercises Other Exercises Other Exercises: Re-education on energy conversation strategies (i.e., activity pacing, positioning, and pursed lip breathing). With pt able to recall 1/3 strategies at end of session           Pertinent Vitals/ Pain       Pain Assessment: No/denies pain         Frequency  Min 1X/week        Progress Toward Goals  OT Goals(current goals can now be found in the care plan section)  Progress towards OT goals: Progressing toward goals  Acute Rehab OT Goals Patient Stated Goal: to go home OT Goal Formulation: With patient Time For Goal Achievement: 09/02/20 Potential to Achieve Goals: Good  Plan Discharge plan remains appropriate;Frequency remains appropriate    Co-evaluation                 AM-PAC OT "6 Clicks" Daily Activity     Outcome Measure   Help from another person eating meals?: None Help from another person taking care of personal grooming?: A Little Help from another person toileting, which includes using toliet, bedpan, or urinal?: A Little Help from another person bathing (including washing, rinsing, drying)?: A Lot Help from another person to put on and taking off regular upper body clothing?: A Little Help from another person to put on and taking off regular lower body clothing?: A Little 6 Click Score: 18    End of Session Equipment Utilized During Treatment: Gait belt;Oxygen  OT Visit Diagnosis: Muscle weakness (generalized) (M62.81);History of falling (Z91.81)   Activity Tolerance Patient tolerated treatment well   Patient Left in chair;with call bell/phone within reach;with chair alarm set;with nursing/sitter in room   Nurse Communication Mobility status;Other (comment) (SpO2)        Time: 1027-2536 OT Time Calculation (min): 27  min  Charges: OT General Charges $OT Visit: 1 Visit OT Treatments $Self Care/Home Management : 23-37 mins  Matthew Folks, OTR/L [facility] [phone]

## 2020-08-23 DIAGNOSIS — I5031 Acute diastolic (congestive) heart failure: Secondary | ICD-10-CM

## 2020-08-23 DIAGNOSIS — Q231 Congenital insufficiency of aortic valve: Secondary | ICD-10-CM

## 2020-08-23 DIAGNOSIS — I712 Thoracic aortic aneurysm, without rupture: Secondary | ICD-10-CM

## 2020-08-23 LAB — RENAL FUNCTION PANEL
Albumin: 3.2 g/dL — ABNORMAL LOW (ref 3.5–5.0)
Albumin: 3.3 g/dL — ABNORMAL LOW (ref 3.5–5.0)
Anion gap: 8 (ref 5–15)
Anion gap: 8 (ref 5–15)
BUN: 63 mg/dL — ABNORMAL HIGH (ref 6–20)
BUN: 61 mg/dL — ABNORMAL HIGH (ref 6–20)
CO2: 40 mmol/L — ABNORMAL HIGH (ref 22–32)
CO2: 40 mmol/L — ABNORMAL HIGH (ref 22–32)
Calcium: 8.8 mg/dL — ABNORMAL LOW (ref 8.9–10.3)
Calcium: 8.6 mg/dL — ABNORMAL LOW (ref 8.9–10.3)
Chloride: 91 mmol/L — ABNORMAL LOW (ref 98–111)
Chloride: 91 mmol/L — ABNORMAL LOW (ref 98–111)
Creatinine, Ser: 1.45 mg/dL — ABNORMAL HIGH (ref 0.61–1.24)
Creatinine, Ser: 1.55 mg/dL — ABNORMAL HIGH (ref 0.61–1.24)
GFR, Estimated: 56 mL/min — ABNORMAL LOW (ref >60)
GFR, Estimated: 51 mL/min — ABNORMAL LOW (ref >60)
Glucose, Bld: 97 mg/dL (ref 70–99)
Glucose, Bld: 125 mg/dL — ABNORMAL HIGH (ref 70–99)
Phosphorus: 1.2 mg/dL — ABNORMAL LOW (ref 2.5–4.6)
Phosphorus: 3.5 mg/dL (ref 2.5–4.6)
Potassium: 5 mmol/L (ref 3.5–5.1)
Potassium: 5 mmol/L (ref 3.5–5.1)
Sodium: 139 mmol/L (ref 135–145)
Sodium: 139 mmol/L (ref 135–145)

## 2020-08-23 LAB — MAGNESIUM: Magnesium: 2 mg/dL (ref 1.7–2.4)

## 2020-08-23 MED ORDER — POTASSIUM & SODIUM PHOSPHATES 280-160-250 MG PO PACK: 1.0000 | PACK | Freq: Three times a day (TID) | ORAL | Status: DC

## 2020-08-23 MED ORDER — FUROSEMIDE 10 MG/ML PO SOLN
60.0000 mg | Freq: Every day | ORAL | Status: DC
  Filled 2020-08-23: qty 10

## 2020-08-23 MED ORDER — FUROSEMIDE 40 MG PO TABS
60.0000 mg | ORAL_TABLET | Freq: Every day | ORAL | Status: DC
  Administered 2020-08-23 – 2020-08-25 (×3): 60 mg via ORAL
  Filled 2020-08-23 (×3): qty 1

## 2020-08-23 MED ORDER — SODIUM PHOSPHATES 45 MMOLE/15ML IV SOLN
30.0000 mmol | Freq: Once | INTRAVENOUS | Status: AC
  Administered 2020-08-23: 30 mmol via INTRAVENOUS
  Filled 2020-08-23: qty 10

## 2020-08-23 NOTE — Progress Notes (Signed)
PHARMACY CONSULT NOTE  Pharmacy Consult for Electrolyte Monitoring and Replacement   Recent Labs: Potassium (mmol/L)  Date Value  08/23/2020 5.0  11/02/2013 3.9   Magnesium (mg/dL)  Date Value  60/02/9322 2.0  11/02/2013 0.9 (L)   Calcium (mg/dL)  Date Value  55/73/2202 8.8 (L)   Calcium, Total (mg/dL)  Date Value  54/27/0623 7.7 (L)   Albumin (g/dL)  Date Value  76/28/3151 3.2 (L)  08/04/2011 3.9   Phosphorus (mg/dL)  Date Value  76/16/0737 1.2 (L)   Sodium (mmol/L)  Date Value  08/23/2020 139  11/02/2013 130 (L)   Corrected Ca: 8.34 mg/dL  Assessment: 60 y.o. male with medical history significant for hypertension, history of GERD, history of heart failure exacerbation, history of seizures, last seizure was approximately 2 years ago, CKD 3, hyperlipidemia, COPD, history of CVA about 2.5 years ago, alcohol abuse and tobacco abuse who presents to the emergency department for chief concerns of shortness of breath and worsening lower extremity edema.  Goal of Therapy:  Electrolytes WNL  Plan:   30 mmol IV sodium phosphate x 1  Recheck renal function panel at completion of sodium phosphate infusion  Patient is at high refeed risk:  monitor potassium, magnesium and phosphorus labs daily until stable  Lowella Bandy ,PharmD Clinical Pharmacist 08/23/2020 8:16 AM

## 2020-08-23 NOTE — Progress Notes (Signed)
PROGRESS NOTE    Darin Hopkins  [name]:[id number] DOB: 1960/10/17 DOA: 08/17/2020 PCP: Martie Round, NP   Brief Narrative: Taken from prior notes. 60 y.o.malewith medical history significant forhypertension, history of GERD, history of heart failure exacerbation, history of seizures, last seizure was approximately 2 years ago, CKD 3, hyperlipidemia, COPD,history of CVA about 2.5 years ago, alcohol abuse, tobacco abuse,presents to the emergency department for chief concerns of shortness of breath and worsening lower extremity edema. He was compliant with his medications though he has not seen a doctor in 2 years. BNP elevated at 1306.  Echocardiogram with normal EF and normal diastolic function. He was hypoxic on admission, requiring initially BiPAP.  Currently on 2 to 4 L of oxygen.  No prior oxygen use.  Chest x-ray with airspace infiltrates bilaterally suspicious for pulmonary edema.  Repeat chest x-ray with peribronchial thickening and concern for atypical pneumonia.  Patient remained afebrile and no leukocytosis. Patient underwent Myoview on 08/22/20, it was  low risk, probably normal with small basal inferior fixed defect most likely representing artifact but unable to exclude a small region of scar.   Cardiology does not think that coronary insufficiency is contributing to his dyspnea and they were recommending transitioning to p.o. Lasix from tomorrow with anticipation of discharge in a day. Patient will need outpatient pulmonology evaluation for his COPD.  Subjective: Patient was seen and examined today.  No new complaint.  We discussed about trying breathing through nose while awake and use mask while sleeping to optimize the use of oxygen.  Assessment & Plan:   Principal Problem:   Acute exacerbation of CHF (congestive heart failure) (HCC) Active Problems:   Essential hypertension   History of seizure   COPD (chronic obstructive pulmonary disease) (HCC)   Hyperlipidemia    GERD (gastroesophageal reflux disease)   Alcohol abuse   Acute decompensated heart failure (HCC)   Acute respiratory failure with hypoxia (HCC)   Protein-calorie malnutrition, severe  Acute hypoxic respiratory failure most likely secondary to acute diastolic heart failure. Elevated BNP and pulmonary and lower extremity edema.  Echocardiogram within normal limit.  Some improvement in diuresis after adding metolazone with Lasix Total net of -4400. -CT chest for further evaluation of bilateral infiltrates-shows concern of COPD, no interstitial lung disease and bilateral small pleural effusions. -Procalcitonin-negative -Hold IV Lasix today due to worsening azotemia-will transition to p.o. from tomorrow. -Hold metolazone -Continue with daily BMP and weight -Strict intake and output -Supplemental oxygen to keep the saturation above 90%-use mask while sleeping as he is a mouth breather.  Elevated troponin.  No chest pain and EKG without any acute changes.  Most likely secondary to demand ischemia with heart failure exacerbation. -Underwent Myoview which was low risk. -Cardiology does not need that he needs any more investigation and coronary insufficiency is contributing to his dyspnea.  Hypertension.  Blood pressure within goal. -Continue home dose of lisinopril and amlodipine. -Keep holding home dose of HCTZ and hydralazine. -Patient is also on IV Lasix.  Hypophosphatemia.  Phosphorus of 1.2.  Potassium of 5. -Replete with sodium phosphate. -Monitor phosphorus  CKD stage IIIb.  Baseline creatinine appears to be around 1.5-1.7.  Worsening azotemia and he appears to reach his dry weight, holding IV Lasix today. -Monitor renal function. -Avoid nephrotoxins  COPD.  Does not appear to be in exacerbation. -Continue home inhalers and bronchodilators  History of seizure disorder.  Last known seizure was approximately 2 years ago. -Continue with Keppra 500 mg twice daily -Continue  with  seizure precautions  Alcohol abuse. drinks 1/5 of liquor per day per sister, CIWA protocal initiated No evidence of clinical withdrawals at this time. -Continue with CIWA protocol -Continue counseling for alcohol cessation  Tobacco abuse. -Nicotine patch  Hyperlipidemia. Continue with atorvastatin.  History of CVA. -Continue with home dose of aspirin and statin  GERD. -Continue home dose of omeprazole.  Malnourishment.  Patient appears malnourished.  Albumin within normal limit. Estimated body mass index is 20.42 kg/m as calculated from the following:   Height as of this encounter: 5\' 7"  (1.702 m).   Weight as of this encounter: 59.1 kg.  -Dietitian consult  Physical deconditioning. -PT is recommending SNF placement-unable to find any SNF who accept his insurance. Most likely will be going home with home health services as TOC find one company who can provide RN and PT. sister will be helping.  Objective: Vitals:   08/23/20 0356 08/23/20 0500 08/23/20 0727 08/23/20 1159  BP: 113/81  117/83 107/79  Pulse: 88  84 79  Resp: 18  18 18   Temp: 98.2 F (36.8 C)  98.9 F (37.2 C) 98.7 F (37.1 C)  TempSrc: Oral  Oral Oral  SpO2: 92%  96% 96%  Weight:  59.1 kg    Height:        Intake/Output Summary (Last 24 hours) at 08/23/2020 1353 Last data filed at 08/23/2020 1008 Gross per 24 hour  Intake 1080 ml  Output 1900 ml  Net -820 ml   Filed Weights   08/21/20 0521 08/22/20 0500 08/23/20 0500  Weight: 62 kg 62.7 kg 59.1 kg    Examination:  General.  Malnourished gentleman, in no acute distress. Pulmonary.  Lungs clear bilaterally, normal respiratory effort. CV.  Regular rate and rhythm, no JVD, rub or murmur. Abdomen.  Soft, nontender, nondistended, BS positive. CNS.  Alert and oriented x3.  No focal neurologic deficit. Extremities.  No edema, no cyanosis, pulses intact and symmetrical. Psychiatry.  Judgment and insight appears normal.  DVT prophylaxis:  Lovenox Code Status: Full. Family Communication: Discussed with patient. Disposition Plan:  Status is: Inpatient  Remains inpatient appropriate because:Inpatient level of care appropriate due to severity of illness   Dispo: The patient is from: Home              Anticipated d/c is to: Home              Patient currently is not medically stable to d/c.   Difficult to place patient Yes               Level of care: Progressive Cardiac  All the records are reviewed and case discussed with Care Management/Social Worker. Management plans discussed with the patient, nursing and they are in agreement.  Consultants:   Cardiology  Procedures:  Antimicrobials:   Data Reviewed: I have personally reviewed following labs and imaging studies  CBC: Recent Labs  Lab 08/17/20 1037 08/18/20 0329  WBC 9.9 7.1  NEUTROABS 4.2 3.0  HGB 13.3 12.9*  HCT 44.3 41.1  MCV 103.5* 100.5*  PLT 204 213   Basic Metabolic Panel: Recent Labs  Lab 08/17/20 1418 08/18/20 0329 08/19/20 0415 08/20/20 0426 08/21/20 0415 08/22/20 0426 08/23/20 0416  NA  --    < > 140 141 140 139 139  K  --    < > 3.7 4.7 4.5 4.7 5.0  CL  --    < > 103 104 98 92* 91*  CO2  --    < >  29 32 35* 40* 40*  GLUCOSE  --    < > 89 102* 103* 105* 97  BUN  --    < > 43* 37* 35* 47* 63*  CREATININE  --    < > 1.63* 1.52* 1.52* 1.64* 1.45*  CALCIUM  --    < > 8.2* 8.3* 8.4* 8.5* 8.8*  MG 1.9  --   --  1.8 1.8 2.0 2.0  PHOS 3.6  --   --   --   --   --  1.2*   < > = values in this interval not displayed.   GFR: Estimated Creatinine Clearance: 45.9 mL/min (A) (by C-G formula based on SCr of 1.45 mg/dL (H)). Liver Function Tests: Recent Labs  Lab 08/17/20 1037 08/23/20 0416  AST 22  --   ALT 14  --   ALKPHOS 40  --   BILITOT 0.6  --   PROT 6.9  --   ALBUMIN 3.6 3.2*   No results for input(s): LIPASE, AMYLASE in the last 168 hours. No results for input(s): AMMONIA in the last 168 hours. Coagulation Profile: No  results for input(s): INR, PROTIME in the last 168 hours. Cardiac Enzymes: No results for input(s): CKTOTAL, CKMB, CKMBINDEX, TROPONINI in the last 168 hours. BNP (last 3 results) No results for input(s): PROBNP in the last 8760 hours. HbA1C: No results for input(s): HGBA1C in the last 72 hours. CBG: Recent Labs  Lab 08/17/20 1342  GLUCAP 110*   Lipid Profile: Recent Labs    08/21/20 1114  CHOL 179  HDL 75  LDLCALC 93  TRIG 55  CHOLHDL 2.4   Thyroid Function Tests: No results for input(s): TSH, T4TOTAL, FREET4, T3FREE, THYROIDAB in the last 72 hours. Anemia Panel: No results for input(s): VITAMINB12, FOLATE, FERRITIN, TIBC, IRON, RETICCTPCT in the last 72 hours. Sepsis Labs: Recent Labs  Lab 08/20/20 0428  PROCALCITON <0.10    Recent Results (from the past 240 hour(s))  Resp Panel by RT-PCR (Flu A&B, Covid) Nasopharyngeal Swab     Status: None   Collection Time: 08/17/20 10:38 AM   Specimen: Nasopharyngeal Swab; Nasopharyngeal(NP) swabs in vial transport medium  Result Value Ref Range Status   SARS Coronavirus 2 by RT PCR NEGATIVE NEGATIVE Final    Comment: (NOTE) SARS-CoV-2 target nucleic acids are NOT DETECTED.  The SARS-CoV-2 RNA is generally detectable in upper respiratory specimens during the acute phase of infection. The lowest concentration of SARS-CoV-2 viral copies this assay can detect is 138 copies/mL. A negative result does not preclude SARS-Cov-2 infection and should not be used as the sole basis for treatment or other patient management decisions. A negative result may occur with  improper specimen collection/handling, submission of specimen other than nasopharyngeal swab, presence of viral mutation(s) within the areas targeted by this assay, and inadequate number of viral copies(<138 copies/mL). A negative result must be combined with clinical observations, patient history, and epidemiological information. The expected result is Negative.  Fact  Sheet for Patients:  BloggerCourse.com  Fact Sheet for Healthcare Providers:  SeriousBroker.it  This test is no t yet approved or cleared by the [place] FDA and  has been authorized for detection and/or diagnosis of SARS-CoV-2 by FDA under an Emergency Use Authorization (EUA). This EUA will remain  in effect (meaning this test can be used) for the duration of the COVID-19 declaration under Section 564(b)(1) of the Act, 21 U.S.C.section 360bbb-3(b)(1), unless the authorization is terminated  or revoked sooner.  Influenza A by PCR NEGATIVE NEGATIVE Final   Influenza B by PCR NEGATIVE NEGATIVE Final    Comment: (NOTE) The Xpert Xpress SARS-CoV-2/FLU/RSV plus assay is intended as an aid in the diagnosis of influenza from Nasopharyngeal swab specimens and should not be used as a sole basis for treatment. Nasal washings and aspirates are unacceptable for Xpert Xpress SARS-CoV-2/FLU/RSV testing.  Fact Sheet for Patients: BloggerCourse.com  Fact Sheet for Healthcare Providers: SeriousBroker.it  This test is not yet approved or cleared by the [place] FDA and has been authorized for detection and/or diagnosis of SARS-CoV-2 by FDA under an Emergency Use Authorization (EUA). This EUA will remain in effect (meaning this test can be used) for the duration of the COVID-19 declaration under Section 564(b)(1) of the Act, 21 U.S.C. section 360bbb-3(b)(1), unless the authorization is terminated or revoked.  Performed at [facility], [address] [place], [place] [address]   MRSA PCR Screening     Status: None   Collection Time: 08/17/20  1:45 PM   Specimen: Nasal Mucosa; Nasopharyngeal  Result Value Ref Range Status   MRSA by PCR NEGATIVE NEGATIVE Final    Comment:        The GeneXpert MRSA Assay (FDA approved for NASAL specimens only), is one component of  a comprehensive MRSA colonization surveillance program. It is not intended to diagnose MRSA infection nor to guide or monitor treatment for MRSA infections. Performed at [facility], [address] [place], [place] [address]      Radiology Studies: NM Myocar Multi W/Spect W/Wall Motion / EF  Result Date: 08/22/2020  Low risk, probably normal pharmacologic myocardial perfusion stress test.  There is a small in size, moderate in severity, fixed basal inferior defect most likely representing artifact but cannot rule out a small area of scar.  There is no evidence of significant ischemia.  The left ventricular ejection fraction is normal (64%).  Coronary artery calcifications are present. Small bilateral pleural effusion are noted on the attenuation correction CT.     Scheduled Meds: . amLODipine  10 mg Oral QHS  . aspirin EC  81 mg Oral Daily  . atorvastatin  20 mg Oral Daily  . Chlorhexidine Gluconate Cloth  6 each Topical Q2200  . enoxaparin (LOVENOX) injection  40 mg Subcutaneous Q24H  . feeding supplement  237 mL Oral TID BM  . folic acid  1 mg Oral Daily  . furosemide  60 mg Oral Daily  . Ipratropium-Albuterol  1 puff Inhalation QID  . levETIRAcetam  500 mg Oral BID  . lisinopril  5 mg Oral Daily  . mometasone-formoterol  2 puff Inhalation BID  . multivitamin with minerals  1 tablet Oral Daily  . pantoprazole  40 mg Oral Daily  . sodium chloride flush  3 mL Intravenous Q12H  . thiamine  100 mg Oral Daily   Or  . thiamine  100 mg Intravenous Daily   Continuous Infusions: . sodium chloride    . sodium phosphate  Dextrose 5% IVPB 30 mmol (08/23/20 0958)     LOS: 5 days   Time spent: 30 minutes. More than 50% of the time was spent in counseling/coordination of care  Arnetha Courser, MD [facility]  If 7PM-7AM, please contact night-coverage [email or web address]  08/23/2020, 1:53 PM   This record has been created using Conservation officer, historic buildings.  Errors have been sought and corrected,but may not always be located. Such creation errors do not reflect on the standard of  care.

## 2020-08-23 NOTE — Progress Notes (Signed)
Mobility Specialist - Progress Note   08/23/20 1200  Mobility  Activity Ambulated in room  Level of Assistance Standby assist, set-up cues, supervision of patient - no hands on  Assistive Device Front wheel walker  Distance Ambulated (ft) 20 ft  Mobility Response Tolerated well  Mobility performed by Mobility specialist  $Mobility charge 1 Mobility    Pre-mobility: 81 HR, 91% SpO2 During mobility: 86 HR, 80% SpO2 Post-mobility: 78 HR, 91% SpO2   Pt ambulated in room with RW. No LOB. O2 desat to 87% upon standing. Utilizing 4L. Pt shows difficulty carrying out PLB technique with cues to breathe through nose. O2 desat to a low of 80% during ambulation. Approx. 3 mins with seated rest to rebound sats above 88%. VC for sequencing during turns. Denied SOB, no s/s of distress.    Filiberto Pinks Mobility Specialist 08/23/20, 12:10 PM

## 2020-08-23 NOTE — Progress Notes (Signed)
Progress Note  Patient Name: Darin Hopkins Date of Encounter: 08/23/2020  Primary Cardiologist: No primary care provider on file.   Subjective   Dyspnea approaching baseline.   Inpatient Medications    Scheduled Meds: . amLODipine  10 mg Oral QHS  . aspirin EC  81 mg Oral Daily  . atorvastatin  20 mg Oral Daily  . Chlorhexidine Gluconate Cloth  6 each Topical Q2200  . enoxaparin (LOVENOX) injection  40 mg Subcutaneous Q24H  . feeding supplement  237 mL Oral TID BM  . folic acid  1 mg Oral Daily  . Ipratropium-Albuterol  1 puff Inhalation QID  . levETIRAcetam  500 mg Oral BID  . lisinopril  5 mg Oral Daily  . mometasone-formoterol  2 puff Inhalation BID  . multivitamin with minerals  1 tablet Oral Daily  . pantoprazole  40 mg Oral Daily  . sodium chloride flush  3 mL Intravenous Q12H  . thiamine  100 mg Oral Daily   Or  . thiamine  100 mg Intravenous Daily   Continuous Infusions: . sodium chloride    . sodium phosphate  Dextrose 5% IVPB 30 mmol (08/23/20 0958)   PRN Meds: sodium chloride, acetaminophen, ondansetron (ZOFRAN) IV, sodium chloride flush   Vital Signs    Vitals:   08/22/20 2058 08/23/20 0356 08/23/20 0500 08/23/20 0727  BP: 108/80 113/81  117/83  Pulse:  88  84  Resp:  18  18  Temp: 98.4 F (36.9 C) 98.2 F (36.8 C)  98.9 F (37.2 C)  TempSrc: Oral Oral  Oral  SpO2: 95% 92%  96%  Weight:   59.1 kg   Height:        Intake/Output Summary (Last 24 hours) at 08/23/2020 1116 Last data filed at 08/23/2020 1008 Gross per 24 hour  Intake 1440 ml  Output 2300 ml  Net -860 ml   Filed Weights   08/21/20 0521 08/22/20 0500 08/23/20 0500  Weight: 62 kg 62.7 kg 59.1 kg    Telemetry    NSR - Personally Reviewed  ECG    none - Personally Reviewed  Physical Exam   GEN: No acute distress.   Neck: 6 cm JVD Cardiac: RRR, 1/6 systolic murmurs, rubs, or gallops.  Respiratory: Clear to auscultation bilaterally though decreased breath sounds in  bases. GI: Soft, nontender, non-distended  MS: No edema; No deformity. Neuro:  Nonfocal  Psych: Normal affect   Labs    Chemistry Recent Labs  Lab 08/17/20 1037 08/18/20 0329 08/21/20 0415 08/22/20 0426 08/23/20 0416  NA 146*   < > 140 139 139  K 4.8   < > 4.5 4.7 5.0  CL 112*   < > 98 92* 91*  CO2 27   < > 35* 40* 40*  GLUCOSE 103*   < > 103* 105* 97  BUN 41*   < > 35* 47* 63*  CREATININE 1.54*   < > 1.52* 1.64* 1.45*  CALCIUM 8.4*   < > 8.4* 8.5* 8.8*  PROT 6.9  --   --   --   --   ALBUMIN 3.6  --   --   --  3.2*  AST 22  --   --   --   --   ALT 14  --   --   --   --   ALKPHOS 40  --   --   --   --   BILITOT 0.6  --   --   --   --  GFRNONAA 52*   < > 52* 48* 56*  ANIONGAP 7   < > 7 7 8    < > = values in this interval not displayed.     Hematology Recent Labs  Lab 08/17/20 1037 08/18/20 0329  WBC 9.9 7.1  RBC 4.28 4.09*  HGB 13.3 12.9*  HCT 44.3 41.1  MCV 103.5* 100.5*  MCH 31.1 31.5  MCHC 30.0 31.4  RDW 13.2 13.3  PLT 204 213    Cardiac EnzymesNo results for input(s): TROPONINI in the last 168 hours. No results for input(s): TROPIPOC in the last 168 hours.   BNP Recent Labs  Lab 08/17/20 1037  BNP 1,306.1*     DDimer No results for input(s): DDIMER in the last 168 hours.   Radiology    NM Myocar Multi W/Spect W/Wall Motion / EF  Result Date: 08/22/2020  Low risk, probably normal pharmacologic myocardial perfusion stress test.  There is a small in size, moderate in severity, fixed basal inferior defect most likely representing artifact but cannot rule out a small area of scar.  There is no evidence of significant ischemia.  The left ventricular ejection fraction is normal (64%).  Coronary artery calcifications are present. Small bilateral pleural effusion are noted on the attenuation correction CT.     Cardiac Studies   2D echo reviewed. EF normal  Patient Profile     60 y.o. male admitted with acute diastolic heart failure and renal  insufficiency.  Assessment & Plan    1. Acute diastolic heart failure - his weight continues down. We will treat with oral lasix with plan for DC tomorrow if no other issues. 2. AS - moderate. Will need repeat echo in a year or two as an outpatient. 3. Chronic stage 3 renal insufficiency - his creatinine continues to come down. We will follow.     For questions or updates, please contact [facility] Please consult [email or web address] for contact info under Cardiology/STEMI.      Signed, 46, MD  08/23/2020, 11:16 AM  Patient ID: 08/25/2020, male   DOB: 1961-01-25, 60 y.o.   MRN: [id number]

## 2020-08-24 LAB — RENAL FUNCTION PANEL
Albumin: 3.2 g/dL — ABNORMAL LOW (ref 3.5–5.0)
Anion gap: 10 (ref 5–15)
BUN: 61 mg/dL — ABNORMAL HIGH (ref 6–20)
CO2: 41 mmol/L — ABNORMAL HIGH (ref 22–32)
Calcium: 8.9 mg/dL (ref 8.9–10.3)
Chloride: 89 mmol/L — ABNORMAL LOW (ref 98–111)
Creatinine, Ser: 1.49 mg/dL — ABNORMAL HIGH (ref 0.61–1.24)
GFR, Estimated: 54 mL/min — ABNORMAL LOW (ref >60)
Glucose, Bld: 94 mg/dL (ref 70–99)
Phosphorus: 3.1 mg/dL (ref 2.5–4.6)
Potassium: 5.1 mmol/L (ref 3.5–5.1)
Sodium: 140 mmol/L (ref 135–145)

## 2020-08-24 LAB — MAGNESIUM: Magnesium: 2.1 mg/dL (ref 1.7–2.4)

## 2020-08-24 NOTE — Progress Notes (Signed)
PHARMACY CONSULT NOTE  Pharmacy Consult for Electrolyte Monitoring and Replacement   Recent Labs: Potassium (mmol/L)  Date Value  08/24/2020 5.1  11/02/2013 3.9   Magnesium (mg/dL)  Date Value  80/16/5537 2.1  11/02/2013 0.9 (L)   Calcium (mg/dL)  Date Value  48/27/0786 8.9   Calcium, Total (mg/dL)  Date Value  75/44/9201 7.7 (L)   Albumin (g/dL)  Date Value  00/71/2197 3.2 (L)  08/04/2011 3.9   Phosphorus (mg/dL)  Date Value  58/83/2549 3.1   Sodium (mmol/L)  Date Value  08/24/2020 140  11/02/2013 130 (L)   Corrected Ca: 8.34 mg/dL  Assessment: 60 y.o. male with medical history significant for hypertension, history of GERD, history of heart failure exacerbation, history of seizures, last seizure was approximately 2 years ago, CKD 3, hyperlipidemia, COPD, history of CVA about 2.5 years ago, alcohol abuse and tobacco abuse who presents to the emergency department for chief concerns of shortness of breath and worsening lower extremity edema.  Goal of Therapy:  Electrolytes WNL  Plan:   No further electrolyte replacement required today  Patient is at high refeed risk:  monitor potassium, magnesium and phosphorus labs daily until stable  Lowella Bandy ,PharmD Clinical Pharmacist 08/24/2020 10:57 AM

## 2020-08-24 NOTE — Progress Notes (Signed)
PROGRESS NOTE    Darin Hopkins  MLJ:[id number] DOB: 1960-11-28 DOA: 08/17/2020 PCP: Martie Round, NP   Brief Narrative: Taken from prior notes. 60 y.o.malewith medical history significant forhypertension, history of GERD, history of heart failure exacerbation, history of seizures, last seizure was approximately 2 years ago, CKD 3, hyperlipidemia, COPD,history of CVA about 2.5 years ago, alcohol abuse, tobacco abuse,presents to the emergency department for chief concerns of shortness of breath and worsening lower extremity edema. He was compliant with his medications though he has not seen a doctor in 2 years. BNP elevated at 1306.  Echocardiogram with normal EF and normal diastolic function. He was hypoxic on admission, requiring initially BiPAP.  Currently on 2 to 4 L of oxygen.  No prior oxygen use.  Chest x-ray with airspace infiltrates bilaterally suspicious for pulmonary edema.  Repeat chest x-ray with peribronchial thickening and concern for atypical pneumonia.  Patient remained afebrile and no leukocytosis. Patient underwent Myoview on 08/22/20, it was  low risk, probably normal with small basal inferior fixed defect most likely representing artifact but unable to exclude a small region of scar.   Cardiology does not think that coronary insufficiency is contributing to his dyspnea and they were recommending transitioning to p.o. Lasix from tomorrow with anticipation of discharge in a day. Patient will need outpatient pulmonology evaluation for his COPD as outpatient. PT/OT recommending SNF.  Unable to get him to SNF due to insurance issues.  Sister agrees to help him at home and trying to get home set of.  Home health services and equipments ordered.  Home oxygen ordered.  Most likely can go home tomorrow on Monday.  Subjective: Patient was seen and examined today.  Eating his breakfast.  No new complaints.  Assessment & Plan:   Principal Problem:   Acute exacerbation of CHF  (congestive heart failure) (HCC) Active Problems:   Essential hypertension   History of seizure   COPD (chronic obstructive pulmonary disease) (HCC)   Hyperlipidemia   GERD (gastroesophageal reflux disease)   Alcohol abuse   Acute decompensated heart failure (HCC)   Acute respiratory failure with hypoxia (HCC)   Protein-calorie malnutrition, severe  Acute hypoxic respiratory failure most likely secondary to acute diastolic heart failure. Elevated BNP and pulmonary and lower extremity edema.  Echocardiogram within normal limit.  Some improvement in diuresis after adding metolazone with Lasix Total net of -4400. -CT chest for further evaluation of bilateral infiltrates-shows concern of COPD, no interstitial lung disease and bilateral small pleural effusions. -Procalcitonin-negative -Start p.o. Lasix at 60 mg daily per cardiology recommendations. -Hold metolazone -Continue with daily BMP and weight -Strict intake and output -Supplemental oxygen to keep the saturation above 90%-use mask while sleeping as he is a mouth breather.  Elevated troponin.  No chest pain and EKG without any acute changes.  Most likely secondary to demand ischemia with heart failure exacerbation. -Underwent Myoview which was low risk. -Cardiology does not need that he needs any more investigation and coronary insufficiency is contributing to his dyspnea.  Hypertension.  Blood pressure within goal. -Continue home dose of lisinopril and amlodipine. -Keep holding home dose of HCTZ and hydralazine. -Patient is also on  Lasix.  Hypophosphatemia.  Resolved with repletion. -Monitor phosphorus and replete as needed  CKD stage IIIb.  Baseline creatinine appears to be around 1.5-1.7.  Worsening azotemia and he appears to reach his dry weight. -Monitor renal function. -Avoid nephrotoxins  COPD.  Does not appear to be in exacerbation. -Continue home inhalers and  bronchodilators -Patient will need outpatient pulmonary  evaluation.  History of seizure disorder.  Last known seizure was approximately 2 years ago. -Continue with Keppra 500 mg twice daily -Continue with seizure precautions  Alcohol abuse. drinks 1/5 of liquor per day per sister, CIWA protocal initiated No evidence of clinical withdrawals at this time. -Continue with CIWA protocol -Continue counseling for alcohol cessation  Tobacco abuse. -Nicotine patch  Hyperlipidemia. Continue with atorvastatin.  History of CVA. -Continue with home dose of aspirin and statin  GERD. -Continue home dose of omeprazole.  Malnourishment.  Patient appears malnourished.  Albumin within normal limit. Estimated body mass index is 20.74 kg/m as calculated from the following:   Height as of this encounter: 5\' 7"  (1.702 m).   Weight as of this encounter: 60.1 kg.  -Dietitian consult  Physical deconditioning. -PT is recommending SNF placement-unable to find any SNF who accept his insurance. Most likely will be going home with home health services as TOC find one company who can provide RN and PT. sister will be helping.  Objective: Vitals:   08/24/20 0351 08/24/20 0424 08/24/20 0808 08/24/20 1101  BP: 100/69  106/78 104/83  Pulse:   72 72  Resp: 18  18 18   Temp: 98.6 F (37 C)  98.3 F (36.8 C) 98.3 F (36.8 C)  TempSrc: Oral  Oral Oral  SpO2: 94%  99% 97%  Weight:  60.1 kg    Height:        Intake/Output Summary (Last 24 hours) at 08/24/2020 1226 Last data filed at 08/24/2020 0255 Gross per 24 hour  Intake 120 ml  Output 1400 ml  Net -1280 ml   Filed Weights   08/22/20 0500 08/23/20 0500 08/24/20 0424  Weight: 62.7 kg 59.1 kg 60.1 kg    Examination:  General.  Chronically ill-appearing, malnourished gentleman, in no acute distress. Pulmonary.  Lungs clear bilaterally, normal respiratory effort. CV.  Regular rate and rhythm, no JVD, rub or murmur. Abdomen.  Soft, nontender, nondistended, BS positive. CNS.  Alert and oriented x3.   No focal neurologic deficit. Extremities.  No edema, no cyanosis, pulses intact and symmetrical. Psychiatry.  Judgment and insight appears normal.  DVT prophylaxis: Lovenox Code Status: Full. Family Communication: Discussed with patient, sister was contacted by TOC. Disposition Plan:  Status is: Inpatient  Remains inpatient appropriate because:Inpatient level of care appropriate due to severity of illness   Dispo: The patient is from: Home              Anticipated d/c is to: Home              Patient currently is medically stable.   Difficult to place patient Yes               Level of care: Progressive Cardiac  All the records are reviewed and case discussed with Care Management/Social Worker. Management plans discussed with the patient, nursing and they are in agreement.  Consultants:   Cardiology  Procedures:  Antimicrobials:   Data Reviewed: I have personally reviewed following labs and imaging studies  CBC: Recent Labs  Lab 08/18/20 0329  WBC 7.1  NEUTROABS 3.0  HGB 12.9*  HCT 41.1  MCV 100.5*  PLT 213   Basic Metabolic Panel: Recent Labs  Lab 08/17/20 1418 08/18/20 0329 08/20/20 0426 08/21/20 0415 08/22/20 0426 08/23/20 0416 08/23/20 1747 08/24/20 0437  NA  --    < > 141 140 139 139 139 140  K  --    < >  4.7 4.5 4.7 5.0 5.0 5.1  CL  --    < > 104 98 92* 91* 91* 89*  CO2  --    < > 32 35* 40* 40* 40* 41*  GLUCOSE  --    < > 102* 103* 105* 97 125* 94  BUN  --    < > 37* 35* 47* 63* 61* 61*  CREATININE  --    < > 1.52* 1.52* 1.64* 1.45* 1.55* 1.49*  CALCIUM  --    < > 8.3* 8.4* 8.5* 8.8* 8.6* 8.9  MG 1.9  --  1.8 1.8 2.0 2.0  --  2.1  PHOS 3.6  --   --   --   --  1.2* 3.5 3.1   < > = values in this interval not displayed.   GFR: Estimated Creatinine Clearance: 45.4 mL/min (A) (by C-G formula based on SCr of 1.49 mg/dL (H)). Liver Function Tests: Recent Labs  Lab 08/23/20 0416 08/23/20 1747 08/24/20 0437  ALBUMIN 3.2* 3.3* 3.2*   No results  for input(s): LIPASE, AMYLASE in the last 168 hours. No results for input(s): AMMONIA in the last 168 hours. Coagulation Profile: No results for input(s): INR, PROTIME in the last 168 hours. Cardiac Enzymes: No results for input(s): CKTOTAL, CKMB, CKMBINDEX, TROPONINI in the last 168 hours. BNP (last 3 results) No results for input(s): PROBNP in the last 8760 hours. HbA1C: No results for input(s): HGBA1C in the last 72 hours. CBG: Recent Labs  Lab 08/17/20 1342  GLUCAP 110*   Lipid Profile: No results for input(s): CHOL, HDL, LDLCALC, TRIG, CHOLHDL, LDLDIRECT in the last 72 hours. Thyroid Function Tests: No results for input(s): TSH, T4TOTAL, FREET4, T3FREE, THYROIDAB in the last 72 hours. Anemia Panel: No results for input(s): VITAMINB12, FOLATE, FERRITIN, TIBC, IRON, RETICCTPCT in the last 72 hours. Sepsis Labs: Recent Labs  Lab 08/20/20 0428  PROCALCITON <0.10    Recent Results (from the past 240 hour(s))  Resp Panel by RT-PCR (Flu A&B, Covid) Nasopharyngeal Swab     Status: None   Collection Time: 08/17/20 10:38 AM   Specimen: Nasopharyngeal Swab; Nasopharyngeal(NP) swabs in vial transport medium  Result Value Ref Range Status   SARS Coronavirus 2 by RT PCR NEGATIVE NEGATIVE Final    Comment: (NOTE) SARS-CoV-2 target nucleic acids are NOT DETECTED.  The SARS-CoV-2 RNA is generally detectable in upper respiratory specimens during the acute phase of infection. The lowest concentration of SARS-CoV-2 viral copies this assay can detect is 138 copies/mL. A negative result does not preclude SARS-Cov-2 infection and should not be used as the sole basis for treatment or other patient management decisions. A negative result may occur with  improper specimen collection/handling, submission of specimen other than nasopharyngeal swab, presence of viral mutation(s) within the areas targeted by this assay, and inadequate number of viral copies(<138 copies/mL). A negative result  must be combined with clinical observations, patient history, and epidemiological information. The expected result is Negative.  Fact Sheet for Patients:  BloggerCourse.com  Fact Sheet for Healthcare Providers:  SeriousBroker.it  This test is no t yet approved or cleared by the [place] FDA and  has been authorized for detection and/or diagnosis of SARS-CoV-2 by FDA under an Emergency Use Authorization (EUA). This EUA will remain  in effect (meaning this test can be used) for the duration of the COVID-19 declaration under Section 564(b)(1) of the Act, 21 U.S.C.section 360bbb-3(b)(1), unless the authorization is terminated  or revoked sooner.  Influenza A by PCR NEGATIVE NEGATIVE Final   Influenza B by PCR NEGATIVE NEGATIVE Final    Comment: (NOTE) The Xpert Xpress SARS-CoV-2/FLU/RSV plus assay is intended as an aid in the diagnosis of influenza from Nasopharyngeal swab specimens and should not be used as a sole basis for treatment. Nasal washings and aspirates are unacceptable for Xpert Xpress SARS-CoV-2/FLU/RSV testing.  Fact Sheet for Patients: BloggerCourse.comhttps://www.fda.gov/media/152166/download  Fact Sheet for Healthcare Providers: SeriousBroker.ithttps://www.fda.gov/media/152162/download  This test is not yet approved or cleared by the [place] FDA and has been authorized for detection and/or diagnosis of SARS-CoV-2 by FDA under an Emergency Use Authorization (EUA). This EUA will remain in effect (meaning this test can be used) for the duration of the COVID-19 declaration under Section 564(b)(1) of the Act, 21 U.S.C. section 360bbb-3(b)(1), unless the authorization is terminated or revoked.  Performed at [facility], [address] [place], [place] [address]   MRSA PCR Screening     Status: None   Collection Time: 08/17/20  1:45 PM   Specimen: Nasal Mucosa; Nasopharyngeal  Result Value Ref Range Status   MRSA by  PCR NEGATIVE NEGATIVE Final    Comment:        The GeneXpert MRSA Assay (FDA approved for NASAL specimens only), is one component of a comprehensive MRSA colonization surveillance program. It is not intended to diagnose MRSA infection nor to guide or monitor treatment for MRSA infections. Performed at [facility], [address] [place], [place] [address]      Radiology Studies: No results found.  Scheduled Meds: . amLODipine  10 mg Oral QHS  . aspirin EC  81 mg Oral Daily  . atorvastatin  20 mg Oral Daily  . Chlorhexidine Gluconate Cloth  6 each Topical Q2200  . enoxaparin (LOVENOX) injection  40 mg Subcutaneous Q24H  . feeding supplement  237 mL Oral TID BM  . folic acid  1 mg Oral Daily  . furosemide  60 mg Oral Daily  . Ipratropium-Albuterol  1 puff Inhalation QID  . levETIRAcetam  500 mg Oral BID  . lisinopril  5 mg Oral Daily  . mometasone-formoterol  2 puff Inhalation BID  . multivitamin with minerals  1 tablet Oral Daily  . pantoprazole  40 mg Oral Daily  . sodium chloride flush  3 mL Intravenous Q12H  . thiamine  100 mg Oral Daily   Or  . thiamine  100 mg Intravenous Daily   Continuous Infusions: . sodium chloride       LOS: 6 days   Time spent: 30 minutes. More than 50% of the time was spent in counseling/coordination of care  Arnetha CourserSumayya Reise Gladney, MD [facility]  If 7PM-7AM, please contact night-coverage [email or web address]  08/24/2020, 12:26 PM   This record has been created using Conservation officer, historic buildingsDragon voice recognition software. Errors have been sought and corrected,but may not always be located. Such creation errors do not reflect on the standard of care.

## 2020-08-24 NOTE — TOC Transition Note (Signed)
Transition of Care [facility]) - CM/SW Discharge Note   Patient Details  Name: Darin Hopkins MRN: [id number] Date of Birth: 1960/06/12  [facility]) CM/SW Contact:  Darin Quarry, RN Phone Number: 08/24/2020, 11:36 AM   Clinical Narrative:  Patient to be discharged today.  Lifetime home oxygen arranged via [employer]. HH set up via [facility] by prior CM LCSW. BSC, RW, and shower stool arranged via [employer] on 4/13. Attempted to call patient but no answer. Called and spoke with sister [name] at [phone]. She was unaware of anticipated discharge today and patient hospital bed was being switched out and new bed will be delivered Tuesday. Also concerned about his physical abilities today. Conveyed her concerns to provider. [employer] has not yet been in touch with sister. Gabriel Cirri RN CM   9163 Update: Discharge plan switched to Monday 08/25/20. Notified [employer] and sister. [employer] has connected with sister for set up and she quested a sling portable for use during the day so patient does not become entangled in oxygen tubing and use concentrator at night with BSC. Discussed discharge planning and allowed questions as before, she feels much better to allow more time to set up for discharge planning to occur and be in place. [name] has CM phone number if any questions or concerns arise today.  Gabriel Cirri RN CM [phone].    Final next level of care: Home w Home Health Services Barriers to Discharge: Barriers Resolved   Patient Goals and CMS Choice Patient states their goals for this hospitalization and ongoing recovery are:: to get pt home   Choice offered to / list presented to : Sibling  Discharge Placement                       Discharge Plan and Services In-house Referral: Clinical Social Work   Post Acute Care Choice: Home Health          DME Arranged: Shower stool,3-N-1,Walker rolling DME Agency: [employer] [facility] for DME oxygen/[employer] for other DME) Date DME  Agency Contacted:  (Other DME arranged 4/13 by LCSW) Time DME Agency Contacted: 1110 Representative spoke with at DME Agency:  Ian Malkin via [employer] for other DME, Shower stool,Bedside commode,Walker on 4/13.)   HH Agency: [facility] Date [facility] Agency Contacted: 08/20/20   Representative spoke with at [facility] Agency: [place] per CM notes of 4/13  Social Determinants of Health (SDOH) Interventions     Readmission Risk Interventions No flowsheet data found.

## 2020-08-24 NOTE — TOC Progression Note (Signed)
Transition of Care [facility]) - Progression Note    Patient Details  Name: TAG WURTZ MRN: [id number] Date of Birth: 05-29-1960  Transition of Care [facility]) CM/SW Contact  Bing Quarry, RN Phone Number: 08/24/2020, 11:12 AM  Clinical Narrative:  Patient to discharge today. HH set up via weekday CM with [facility] and DME needs with [employer]. DME oxygen now ordered and set up via [employer]/Jermain Jenkins for Lifetime continuous O2 via [place] and Mask for sleep due to mouth breathing. Jermain to call patient to arrange home set up or sister contact. Will notifiy CM if any issues come up with home set up needs. Notified provider. Gabriel Cirri RN CM 115 am [phone]    Expected Discharge Plan: Home w Home Health Services Barriers to Discharge: Continued Medical Work up  Expected Discharge Plan and Services Expected Discharge Plan: Home w Home Health Services In-house Referral: Clinical Social Work   Post Acute Care Choice: Home Health Living arrangements for the past 2 months: Single Family Home                 DME Arranged: Oxygen DME Agency:  ([employer] per Tiajuana Amass) Date DME Agency Contacted: 08/24/20 Time DME Agency Contacted: 1110 Representative spoke with at DME Agency: Tiajuana Amass via phone call. (Will contact patient or sister contact to make home arrangements for set up.)             Social Determinants of Health (SDOH) Interventions    Readmission Risk Interventions No flowsheet data found.

## 2020-08-24 NOTE — Progress Notes (Signed)
Patient ID: Darin Hopkins, male   DOB: Apr 13, 1961, 61 y.o.   MRN: [id number]   Progress Note  Patient Name: Darin Hopkins Date of Encounter: 08/24/2020  Primary Cardiologist: No primary care provider on file.   Subjective   Denies chest pain or sob. Thinks he is about back to his baseline.  Inpatient Medications    Scheduled Meds: . amLODipine  10 mg Oral QHS  . aspirin EC  81 mg Oral Daily  . atorvastatin  20 mg Oral Daily  . Chlorhexidine Gluconate Cloth  6 each Topical Q2200  . enoxaparin (LOVENOX) injection  40 mg Subcutaneous Q24H  . feeding supplement  237 mL Oral TID BM  . folic acid  1 mg Oral Daily  . furosemide  60 mg Oral Daily  . Ipratropium-Albuterol  1 puff Inhalation QID  . levETIRAcetam  500 mg Oral BID  . lisinopril  5 mg Oral Daily  . mometasone-formoterol  2 puff Inhalation BID  . multivitamin with minerals  1 tablet Oral Daily  . pantoprazole  40 mg Oral Daily  . sodium chloride flush  3 mL Intravenous Q12H  . thiamine  100 mg Oral Daily   Or  . thiamine  100 mg Intravenous Daily   Continuous Infusions: . sodium chloride     PRN Meds: sodium chloride, acetaminophen, ondansetron (ZOFRAN) IV, sodium chloride flush   Vital Signs    Vitals:   08/24/20 0351 08/24/20 0424 08/24/20 0808 08/24/20 1101  BP: 100/69  106/78 104/83  Pulse:   72 72  Resp: 18  18 18   Temp: 98.6 F (37 C)  98.3 F (36.8 C) 98.3 F (36.8 C)  TempSrc: Oral  Oral Oral  SpO2: 94%  99% 97%  Weight:  60.1 kg    Height:        Intake/Output Summary (Last 24 hours) at 08/24/2020 1235 Last data filed at 08/24/2020 0255 Gross per 24 hour  Intake 120 ml  Output 1400 ml  Net -1280 ml   Filed Weights   08/22/20 0500 08/23/20 0500 08/24/20 0424  Weight: 62.7 kg 59.1 kg 60.1 kg    Telemetry    nsr - Personally Reviewed  ECG    none - Personally Reviewed  Physical Exam   GEN: No acute distress.   Neck: No JVD Cardiac: RRR, no murmurs, rubs, or gallops.   Respiratory: Clear to auscultation bilaterally. GI: Soft, nontender, non-distended  MS: No edema; No deformity. Neuro:  Nonfocal  Psych: Normal affect   Labs    Chemistry Recent Labs  Lab 08/23/20 0416 08/23/20 1747 08/24/20 0437  NA 139 139 140  K 5.0 5.0 5.1  CL 91* 91* 89*  CO2 40* 40* 41*  GLUCOSE 97 125* 94  BUN 63* 61* 61*  CREATININE 1.45* 1.55* 1.49*  CALCIUM 8.8* 8.6* 8.9  ALBUMIN 3.2* 3.3* 3.2*  GFRNONAA 56* 51* 54*  ANIONGAP 8 8 10      Hematology Recent Labs  Lab 08/18/20 0329  WBC 7.1  RBC 4.09*  HGB 12.9*  HCT 41.1  MCV 100.5*  MCH 31.5  MCHC 31.4  RDW 13.3  PLT 213    Cardiac EnzymesNo results for input(s): TROPONINI in the last 168 hours. No results for input(s): TROPIPOC in the last 168 hours.   BNPNo results for input(s): BNP, PROBNP in the last 168 hours.   DDimer No results for input(s): DDIMER in the last 168 hours.   Radiology    No results found.  Cardiac Studies   none  Patient Profile     60 y.o. male admitted with acute diastolic heart failure and renal insuff.  Assessment & Plan    1. Acute diastolic heart failure - He appears to be euvolemic at this point and could be discharged home with oral lasix as he is currently receiving. 2. AS - moderate. Repeat echo in 1-2 years 3. Chronic stage 3 renal insuff. - his creatinine is stable. He will need this recheck in 1 week.     For questions or updates, please contact [facility] Please consult [email or web address] for contact info under Cardiology/STEMI.      Signed, Lewayne Bunting, MD  08/24/2020, 12:35 PM

## 2020-08-25 LAB — RENAL FUNCTION PANEL
Albumin: 3.3 g/dL — ABNORMAL LOW (ref 3.5–5.0)
Anion gap: 8 (ref 5–15)
BUN: 67 mg/dL — ABNORMAL HIGH (ref 6–20)
CO2: 42 mmol/L — ABNORMAL HIGH (ref 22–32)
Calcium: 9.1 mg/dL (ref 8.9–10.3)
Chloride: 89 mmol/L — ABNORMAL LOW (ref 98–111)
Creatinine, Ser: 1.58 mg/dL — ABNORMAL HIGH (ref 0.61–1.24)
GFR, Estimated: 50 mL/min — ABNORMAL LOW (ref >60)
Glucose, Bld: 90 mg/dL (ref 70–99)
Phosphorus: 3.2 mg/dL (ref 2.5–4.6)
Potassium: 5.1 mmol/L (ref 3.5–5.1)
Sodium: 139 mmol/L (ref 135–145)

## 2020-08-25 LAB — MAGNESIUM: Magnesium: 2.2 mg/dL (ref 1.7–2.4)

## 2020-08-25 MED ORDER — FUROSEMIDE 20 MG PO TABS: 60.0000 mg | ORAL_TABLET | Freq: Every day | ORAL | 0 refills | Status: DC

## 2020-08-25 MED ORDER — THIAMINE HCL 100 MG PO TABS: 100.0000 mg | ORAL_TABLET | Freq: Every day | ORAL | 0 refills | Status: DC

## 2020-08-25 NOTE — Progress Notes (Signed)
Progress Note  Patient Name: Darin Hopkins Date of Encounter: 08/25/2020  Primary Cardiologist: Mariah Milling  Subjective   No chest pain, dyspnea, or palpitations.   Inpatient Medications    Scheduled Meds: . amLODipine  10 mg Oral QHS  . aspirin EC  81 mg Oral Daily  . atorvastatin  20 mg Oral Daily  . Chlorhexidine Gluconate Cloth  6 each Topical Q2200  . enoxaparin (LOVENOX) injection  40 mg Subcutaneous Q24H  . feeding supplement  237 mL Oral TID BM  . folic acid  1 mg Oral Daily  . furosemide  60 mg Oral Daily  . Ipratropium-Albuterol  1 puff Inhalation QID  . levETIRAcetam  500 mg Oral BID  . lisinopril  5 mg Oral Daily  . mometasone-formoterol  2 puff Inhalation BID  . multivitamin with minerals  1 tablet Oral Daily  . pantoprazole  40 mg Oral Daily  . sodium chloride flush  3 mL Intravenous Q12H  . thiamine  100 mg Oral Daily   Or  . thiamine  100 mg Intravenous Daily   Continuous Infusions: . sodium chloride     PRN Meds: sodium chloride, acetaminophen, ondansetron (ZOFRAN) IV, sodium chloride flush   Vital Signs    Vitals:   08/24/20 1653 08/24/20 2000 08/25/20 0435 08/25/20 0854  BP: 109/78 109/65 112/79 125/82  Pulse: 72   78  Resp: 16  18 18   Temp: 97.7 F (36.5 C) 98.9 F (37.2 C) 98.6 F (37 C) 98 F (36.7 C)  TempSrc: Oral Oral Oral Oral  SpO2: 98% 96% 96% 91%  Weight:      Height:        Intake/Output Summary (Last 24 hours) at 08/25/2020 1000 Last data filed at 08/25/2020 0909 Gross per 24 hour  Intake 240 ml  Output 2100 ml  Net -1860 ml   Filed Weights   08/22/20 0500 08/23/20 0500 08/24/20 0424  Weight: 62.7 kg 59.1 kg 60.1 kg    Telemetry    SR - Personally Reviewed  ECG    No new tracings - Personally Reviewed  Physical Exam   GEN: No acute distress. Frail appearing.   Neck: No JVD. Cardiac: RRR, II/VI systolic murmur RUSB, no rubs, or gallops.  Respiratory: Clear to auscultation bilaterally.  GI: Soft,  nontender, non-distended.   MS: No edema; No deformity. Neuro:  Alert and oriented x 3; Nonfocal.  Psych: Normal affect.  Labs    Chemistry Recent Labs  Lab 08/23/20 1747 08/24/20 0437 08/25/20 0417  NA 139 140 139  K 5.0 5.1 5.1  CL 91* 89* 89*  CO2 40* 41* 42*  GLUCOSE 125* 94 90  BUN 61* 61* 67*  CREATININE 1.55* 1.49* 1.58*  CALCIUM 8.6* 8.9 9.1  ALBUMIN 3.3* 3.2* 3.3*  GFRNONAA 51* 54* 50*  ANIONGAP 8 10 8      HematologyNo results for input(s): WBC, RBC, HGB, HCT, MCV, MCH, MCHC, RDW, PLT in the last 168 hours.  Cardiac EnzymesNo results for input(s): TROPONINI in the last 168 hours. No results for input(s): TROPIPOC in the last 168 hours.   BNPNo results for input(s): BNP, PROBNP in the last 168 hours.   DDimer No results for input(s): DDIMER in the last 168 hours.   Radiology    No results found.  Cardiac Studies   2D echo 08/18/2020: 1. Left ventricular ejection fraction, by estimation, is 60 to 65%. The  left ventricle has normal function. The left ventricle has no  regional  wall motion abnormalities. There is mild left ventricular hypertrophy.  Left ventricular diastolic parameters  were normal. The average left ventricular global longitudinal strain is  -15.1 %. The global longitudinal strain is abnormal.  2. Right ventricular systolic function is normal. The right ventricular  size is normal. There is normal pulmonary artery systolic pressure.  3. Left atrial size was mildly dilated.  4. Right atrial size was moderately dilated.  5. The mitral valve is normal in structure. Trivial mitral valve  regurgitation. No evidence of mitral stenosis.  6. The aortic valve is bicuspid. There is mild calcification of the  aortic valve. There is moderate thickening of the aortic valve. Aortic  valve regurgitation is mild to moderate. Moderate aortic valve stenosis.  7. Aortic dilatation noted. There is mild dilatation of the aortic root,  measuring 41 mm.  There is mild dilatation of the ascending aorta,  measuring 40 mm.  8. The inferior vena cava is normal in size with greater than 50%  respiratory variability, suggesting right atrial pressure of 3 mmHg.   Patient Profile     60 y.o. male with history of HFpEF, aortic stenosis and aortic insufficiency with bicuspid aortic valve, dilated aortic root/ascending aorta, HTN, HLD, COPD, seizure disorder, CVA, tobacco/alcohol use, and GERD who we are seeing for acute on chronic HFpEF:  Assessment & Plan    1. Acute on chronic HFpEF: -Euvolemic  -Remains on low dose Lasix  2. Moderate aortic stenosis with mild to moderate aortic regurgitation with bicuspid aortic valve: -Follow up periodic echo in the outpatient setting   3. Dilated aortic root/ascending aorta: -Measuring 41 mm and 40 mm respectively -Outpatient follow up  4. CKD stage III: -Relatively stable -Will need outpatient follow up labs  5. HTN: -Blood pressure is well controlled -Remains on amlodipine, Lasix, and lisinopril   6. HLD: -Lipitor    For questions or updates, please contact [facility] Please consult [email or web address] for contact info under Cardiology/STEMI.    Signed, Eula Listen, PA-C [facility] Pager: [phone] 08/25/2020, 10:00 AM

## 2020-08-25 NOTE — Plan of Care (Signed)
  Problem: Education: Goal: Knowledge of General Education information will improve Description: Including pain rating scale, medication(s)/side effects and non-pharmacologic comfort measures Outcome: Progressing   Problem: Clinical Measurements: Goal: Respiratory complications will improve Outcome: Progressing   Problem: Clinical Measurements: Goal: Cardiovascular complication will be avoided Outcome: Progressing   

## 2020-08-25 NOTE — Progress Notes (Signed)
Physical Therapy Treatment Patient Details Name: Darin Hopkins MRN: [id number] DOB: 04-21-1961 Today's Date: 08/25/2020    History of Present Illness Patient is a 60 y.o. male with medical history significant for hypertension, history of GERD, history of heart failure exacerbation, history of seizures, last seizure was approximately 2 years ago, CKD 3, hyperlipidemia, COPD, history of CVA about 2.5 years ago, alcohol abuse, tobacco abuse, presents to the emergency department for chief concerns of shortness of breath. Found to have Acute decompensated diastolic heart failure and Acute hypoxic respiratory failure    PT Comments    Patient agreeable to PT and anticipated to d/c home later today per chart. Patient needs assistance for mobility and will need some physical assistance at discharge. Recommend to use rolling walker for support while ambulating for fall prevention. Patient is easily fatigued with standing activity. Cues provided for pursed lip breathing techniques and demonstrated understanding with visual demonstration. Sp02 93% with 4 L02. Also issued HEP for LE strengthening exercises. Although SNF is recommended, patient will be going home today per the chart. Recommend to continue PT at home if possible and patient will need assistance for mobility.     Follow Up Recommendations  SNF     Equipment Recommendations   (patient has rolling walker and BSC in room to bring home)    Recommendations for Other Services       Precautions / Restrictions Precautions Precautions: Fall Restrictions Weight Bearing Restrictions: No    Mobility  Bed Mobility Overal bed mobility: Needs Assistance Bed Mobility: Supine to Sit;Sit to Supine     Supine to sit: Supervision Sit to supine: Supervision   General bed mobility comments: increased time required to complete tasks. verba cues for safety and technique    Transfers Overall transfer level: Needs assistance Equipment used:  None Transfers: Sit to/from Stand Sit to Stand: Min guard         General transfer comment: Min guard for safety  Ambulation/Gait Ambulation/Gait assistance: Min assist   Assistive device: 2 person hand held assist       General Gait Details: patient required minimal assistance for steadying without assistive device. recommend to use rolling walker for safety and fall prevention with ambulation. Sp02 94% on 4 L02.   Stairs             Wheelchair Mobility    Modified Rankin (Stroke Patients Only)       Balance                                            Cognition Arousal/Alertness: Awake/alert Behavior During Therapy: WFL for tasks assessed/performed Overall Cognitive Status: Within Functional Limits for tasks assessed                                        Exercises General Exercises - Lower Extremity Heel Slides: AAROM;Strengthening;Both;10 reps;Supine Hip ABduction/ADduction: AAROM;Strengthening;Both;10 reps;Supine Straight Leg Raises: AAROM;Strengthening;Both;10 reps;Supine Other Exercises Other Exercises: verbal cues for sequencing and technique. written home exercise program provided    General Comments        Pertinent Vitals/Pain Pain Assessment: No/denies pain    Home Living                      Prior Function  PT Goals (current goals can now be found in the care plan section) Acute Rehab PT Goals Patient Stated Goal: to go home PT Goal Formulation: With patient Time For Goal Achievement: 09/02/20 Potential to Achieve Goals: Fair Progress towards PT goals: Progressing toward goals    Frequency    Min 2X/week      PT Plan Current plan remains appropriate    Co-evaluation              AM-PAC PT "6 Clicks" Mobility   Outcome Measure  Help needed turning from your back to your side while in a flat bed without using bedrails?: A Little Help needed moving from lying on  your back to sitting on the side of a flat bed without using bedrails?: A Little Help needed moving to and from a bed to a chair (including a wheelchair)?: A Little Help needed standing up from a chair using your arms (e.g., wheelchair or bedside chair)?: A Little Help needed to walk in hospital room?: A Little Help needed climbing 3-5 steps with a railing? : A Lot 6 Click Score: 17    End of Session Equipment Utilized During Treatment: Oxygen Activity Tolerance: Patient tolerated treatment well Patient left: in bed;with call bell/phone within reach;with bed alarm set   PT Visit Diagnosis: Unsteadiness on feet (R26.81);Muscle weakness (generalized) (M62.81);History of falling (Z91.81)     Time: 9774-1423 PT Time Calculation (min) (ACUTE ONLY): 21 min  Charges:  $Therapeutic Activity: 8-22 mins                     Donna Bernard, PT, MPT    Ina Homes 08/25/2020, 3:49 PM

## 2020-08-25 NOTE — Discharge Summary (Signed)
Physician Discharge Summary  Patient ID: Darin Hopkins MRN: [id number] DOB/AGE: [birth year] 60 y.o.  Admit date: 08/17/2020 Discharge date: 08/25/2020  Admission Diagnoses:  Discharge Diagnoses:  Principal Problem:   Acute exacerbation of CHF (congestive heart failure) (HCC) Active Problems:   Essential hypertension   History of seizure   COPD (chronic obstructive pulmonary disease) (HCC)   Hyperlipidemia   GERD (gastroesophageal reflux disease)   Alcohol abuse   Acute decompensated heart failure (HCC)   Acute respiratory failure with hypoxia (HCC)   Protein-calorie malnutrition, severe   Discharged Condition: fair  Hospital Course:  60 y.o.malewith medical history significant forhypertension, history of GERD, history of heart failure exacerbation, history of seizures, last seizure was approximately 2 years ago, CKD 3, hyperlipidemia, COPD,history of CVA about 2.5 years ago, alcohol abuse, tobacco abuse,presents to the emergency department for chief concerns of shortness of breath and worsening lower extremity edema. He was compliant with his medications though he has not seen a doctor in 2 years. BNP elevated at 1306.  Echocardiogram with normal EF and normal diastolic function. He was hypoxic on admission, requiring initially BiPAP.  Currently on 2 to 4 L of oxygen.  No prior oxygen use.  Chest x-ray with airspace infiltrates bilaterally suspicious for pulmonary edema.  Repeat chest x-ray with peribronchial thickening and concern for atypical pneumonia.  Patient remained afebrile and no leukocytosis. Patient underwent Myoview on 08/22/20, it was low risk, probably normal with small basal inferior fixed defect most likely representing artifact but unable to exclude a small region of scar.  Cardiology does not think that coronary insufficiency is contributing to his dyspnea and has changed Lasix to oral today. Patient will need outpatient pulmonology evaluation for his COPD as  outpatient. PT/OT recommending SNF.  Unable to get him to SNF due to insurance issues.  Sister agrees to help him at home and trying to get home set of.  Home health services and equipments ordered.  Home oxygen ordered.    Patient still medically stable to be discharged today.  #1.  Acute hypoxemic respite failure secondary to acute on chronic diastolic congestive heart failure. Acute on chronic diastolic congestive heart failure Elevated troponin secondary to congestive heart failure exacerbation. Essential hypertension. Patient condition had improved, home oxygen evaluation has been obtained yesterday, home oxygen ordered. Patient has been seen by cardiology, currently patient euvolemic.  We will continue Lasix 60 mg daily per recommendation from cardiology  #2.  Hypophosphatemia. Condition improved.  3.  Chronic kidney disease stage IIIb Renal function still stable.  4. COPD. Stable.  Follow-up with pulmonology.  #5.  Alcohol abuse plan No evidence of withdrawal. Prescribe thiamine for 2 weeks.   Consults: cardiology  Significant Diagnostic Studies:  Myoview  Low risk, probably normal pharmacologic myocardial perfusion stress test.  There is a small in size, moderate in severity, fixed basal inferior defect most likely representing artifact but cannot rule out a small area of scar.  There is no evidence of significant ischemia.  The left ventricular ejection fraction is normal (64%).  Coronary artery calcifications are present. Small bilateral pleural effusion are noted on the attenuation correction CT.  CT CHEST WITHOUT CONTRAST  TECHNIQUE: Multidetector CT imaging of the chest was performed following the standard protocol without intravenous contrast. High resolution imaging of the lungs, as well as inspiratory and expiratory imaging, was performed.  COMPARISON:  No priors.  FINDINGS: Cardiovascular: Heart size is normal. There is no significant pericardial  fluid, thickening or pericardial calcification.  There is aortic atherosclerosis, as well as atherosclerosis of the great vessels of the mediastinum and the coronary arteries, including calcified atherosclerotic plaque in the left main, left anterior descending, left circumflex and right coronary arteries. Calcifications of the aortic valve.  Mediastinum/Nodes: No pathologically enlarged mediastinal or hilar lymph nodes. Please note that accurate exclusion of hilar adenopathy is limited on noncontrast CT scans. Esophagus is unremarkable in appearance. No axillary lymphadenopathy.  Lungs/Pleura: Diffuse bronchial wall thickening with mild to moderate centrilobular and paraseptal emphysema. High-resolution images demonstrate no definite regions of ground-glass attenuation, septal thickening, subpleural reticulation, traction bronchiectasis or frank honeycombing to indicate interstitial lung disease. Inspiratory and expiratory imaging is unremarkable in appearance. Small bilateral pleural effusions with areas of dependent subsegmental atelectasis in the lower lobes of the lungs bilaterally.  Upper Abdomen: Aortic atherosclerosis.  Musculoskeletal: There are no aggressive appearing lytic or blastic lesions noted in the visualized portions of the skeleton.  IMPRESSION: 1. No findings to suggest interstitial lung disease. 2. Diffuse bronchial wall thickening with mild to moderate centrilobular and paraseptal emphysema; imaging findings suggestive of underlying COPD. 3. Small bilateral pleural effusions with bibasilar areas of subsegmental atelectasis in the lower lobes of the lungs bilaterally. 4. Aortic atherosclerosis, in addition to left main and 3 vessel coronary artery disease. Please note that although the presence of coronary artery calcium documents the presence of coronary artery disease, the severity of this disease and any potential stenosis cannot be assessed on this  non-gated CT examination. Assessment for potential risk factor modification, dietary therapy or pharmacologic therapy may be warranted, if clinically indicated. 5. There are calcifications of the aortic valve. Echocardiographic correlation for evaluation of potential valvular dysfunction may be warranted if clinically indicated.  Aortic Atherosclerosis (ICD10-I70.0).   Electronically Signed   By: Trudie Reedaniel  Entrikin M.D.   On: 08/20/2020 20:51  Echo: 1. Left ventricular ejection fraction, by estimation, is 60 to 65%. The left ventricle has normal function. The left ventricle has no regional wall motion abnormalities. There is mild left ventricular hypertrophy. Left ventricular diastolic parameters were normal. The average left ventricular global longitudinal strain is -15.1 %. The global longitudinal strain is abnormal. 2. Right ventricular systolic function is normal. The right ventricular size is normal. There is normal pulmonary artery systolic pressure. 3. Left atrial size was mildly dilated. 4. Right atrial size was moderately dilated. 5. The mitral valve is normal in structure. Trivial mitral valve regurgitation. No evidence of mitral stenosis. 6. The aortic valve is bicuspid. There is mild calcification of the aortic valve. There is moderate thickening of the aortic valve. Aortic valve regurgitation is mild to moderate. Moderate aortic valve stenosis. 7. Aortic dilatation noted. There is mild dilatation of the aortic root, measuring 41 mm. There is mild dilatation of the ascending aorta, measuring 40 mm. 8. The inferior vena cava is normal in size with greater than 50% respiratory variability, suggesting right atrial pressure of 3 mmHg.  Treatments: IV diuretics  Discharge Exam: Blood pressure 125/82, pulse 78, temperature 98 F (36.7 C), temperature source Oral, resp. rate 18, height 5\' 7"  (1.702 m), weight 60.1 kg, SpO2 91 %. General appearance: alert and  cooperative Resp: decreased breathing sounds without crackles Cardio: regular rate and rhythm, S1, S2 normal, no murmur, click, rub or gallop GI: soft, non-tender; bowel sounds normal; no masses,  no organomegaly Extremities: extremities normal, atraumatic, no cyanosis or edema  Disposition: Discharge disposition: 01-Home or Self Care       Discharge Instructions  Diet - low sodium heart healthy   Complete by: As directed    Fluid less than 1547ml/day   Increase activity slowly   Complete by: As directed      Allergies as of 08/25/2020   No Known Allergies     Medication List    TAKE these medications   amLODipine 10 MG tablet Commonly known as: NORVASC Take 1 tablet by mouth daily.   aspirin 81 MG EC tablet Take by mouth.   atorvastatin 20 MG tablet Commonly known as: LIPITOR Take 1 tablet by mouth daily.   doxazosin 2 MG tablet Commonly known as: CARDURA Take 2 mg by mouth daily.   folic acid 1 MG tablet Commonly known as: FOLVITE Take 1 mg by mouth daily.   furosemide 20 MG tablet Commonly known as: LASIX Take 3 tablets (60 mg total) by mouth daily. Start taking on: August 26, 2020   levETIRAcetam 500 MG tablet Commonly known as: KEPPRA Take 500 mg by mouth 2 (two) times daily.   lisinopril 5 MG tablet Commonly known as: ZESTRIL Take 5 mg by mouth daily.   omeprazole 20 MG capsule Commonly known as: PRILOSEC Take 1 capsule by mouth daily.   ProAir HFA 108 (90 Base) MCG/ACT inhaler Generic drug: albuterol Inhale into the lungs.   Spiriva HandiHaler 18 MCG inhalation capsule Generic drug: tiotropium 1 capsule daily.   thiamine 100 MG tablet Take 1 tablet (100 mg total) by mouth daily. Start taking on: August 26, 2020            Durable Medical Equipment  (From admission, onward)         Start     Ordered   08/23/20 0741  For home use only DME oxygen  Once       Comments: Please also provide him with mask to be use during sleep as  he is a mouth breather  Question Answer Comment  Length of Need Lifetime   Mode or (Route) Nasal cannula   Liters per Minute 4   Frequency Continuous (stationary and portable oxygen unit needed)   Oxygen conserving device Yes   Oxygen delivery system Gas      08/23/20 0743   08/20/20 1347  For home use only DME Shower stool  Once        08/20/20 1346   08/20/20 1346  For home use only DME 3 n 1  Once        08/20/20 1346   08/20/20 1344  For home use only DME Walker rolling  Once       Question Answer Comment  Walker: With 5 Inch Wheels   Patient needs a walker to treat with the following condition CVA (cerebral vascular accident) (HCC)      08/20/20 1346          Follow-up Information    [facility] Follow up.   Specialty: Home Health Services Why: Physical Therapy, Nurse Contact information: [address] [place] [address] [phone]        Martie Round, NP Follow up in 1 week(s).   Specialty: Nurse Practitioner Contact information: [address] [place] [address] [phone]        [facility] Follow up in 1 week(s).   Specialty: Cardiology Contact information: [address] [place] [address] [phone]  34 minutes Signed: Marrion Coy 08/25/2020, 10:07 AM

## 2020-08-27 ENCOUNTER — Emergency Department: Payer: Medicaid Other

## 2020-08-27 ENCOUNTER — Other Ambulatory Visit: Payer: Self-pay

## 2020-08-27 ENCOUNTER — Inpatient Hospital Stay
Admission: EM | Admit: 2020-08-27 | Discharge: 2020-09-02 | DRG: 190 | Disposition: A | Discharge status: Home Health | Payer: Medicaid Other | Attending: Internal Medicine | Admitting: Internal Medicine

## 2020-08-27 DIAGNOSIS — E785 Hyperlipidemia, unspecified: Secondary | ICD-10-CM | POA: Diagnosis present

## 2020-08-27 DIAGNOSIS — F1010 Alcohol abuse, uncomplicated: Secondary | ICD-10-CM | POA: Diagnosis present

## 2020-08-27 DIAGNOSIS — Z7982 Long term (current) use of aspirin: Secondary | ICD-10-CM

## 2020-08-27 DIAGNOSIS — J441 Chronic obstructive pulmonary disease with (acute) exacerbation: Principal | ICD-10-CM | POA: Diagnosis present

## 2020-08-27 DIAGNOSIS — J9602 Acute respiratory failure with hypercapnia: Secondary | ICD-10-CM | POA: Diagnosis present

## 2020-08-27 DIAGNOSIS — R0902 Hypoxemia: Secondary | ICD-10-CM

## 2020-08-27 DIAGNOSIS — E43 Unspecified severe protein-calorie malnutrition: Secondary | ICD-10-CM | POA: Diagnosis present

## 2020-08-27 DIAGNOSIS — F0390 Unspecified dementia without behavioral disturbance: Secondary | ICD-10-CM | POA: Diagnosis present

## 2020-08-27 DIAGNOSIS — Z20822 Contact with and (suspected) exposure to covid-19: Secondary | ICD-10-CM | POA: Diagnosis present

## 2020-08-27 DIAGNOSIS — J9621 Acute and chronic respiratory failure with hypoxia: Secondary | ICD-10-CM | POA: Diagnosis present

## 2020-08-27 DIAGNOSIS — Z87898 Personal history of other specified conditions: Secondary | ICD-10-CM

## 2020-08-27 DIAGNOSIS — J9601 Acute respiratory failure with hypoxia: Secondary | ICD-10-CM | POA: Diagnosis present

## 2020-08-27 DIAGNOSIS — N1831 Chronic kidney disease, stage 3a: Secondary | ICD-10-CM | POA: Diagnosis present

## 2020-08-27 DIAGNOSIS — I130 Hypertensive heart and chronic kidney disease with heart failure and stage 1 through stage 4 chronic kidney disease, or unspecified chronic kidney disease: Secondary | ICD-10-CM | POA: Diagnosis present

## 2020-08-27 DIAGNOSIS — I509 Heart failure, unspecified: Secondary | ICD-10-CM | POA: Diagnosis present

## 2020-08-27 DIAGNOSIS — F17210 Nicotine dependence, cigarettes, uncomplicated: Secondary | ICD-10-CM | POA: Diagnosis present

## 2020-08-27 DIAGNOSIS — K219 Gastro-esophageal reflux disease without esophagitis: Secondary | ICD-10-CM | POA: Diagnosis present

## 2020-08-27 DIAGNOSIS — I10 Essential (primary) hypertension: Secondary | ICD-10-CM | POA: Diagnosis present

## 2020-08-27 DIAGNOSIS — R7401 Elevation of levels of liver transaminase levels: Secondary | ICD-10-CM | POA: Diagnosis present

## 2020-08-27 DIAGNOSIS — Z79899 Other long term (current) drug therapy: Secondary | ICD-10-CM

## 2020-08-27 DIAGNOSIS — E875 Hyperkalemia: Secondary | ICD-10-CM | POA: Diagnosis present

## 2020-08-27 DIAGNOSIS — J449 Chronic obstructive pulmonary disease, unspecified: Secondary | ICD-10-CM | POA: Diagnosis present

## 2020-08-27 DIAGNOSIS — R0603 Acute respiratory distress: Secondary | ICD-10-CM

## 2020-08-27 DIAGNOSIS — Z8673 Personal history of transient ischemic attack (TIA), and cerebral infarction without residual deficits: Secondary | ICD-10-CM

## 2020-08-27 LAB — CBC WITH DIFFERENTIAL/PLATELET
Abs Immature Granulocytes: 0.03 10*3/uL (ref 0.00–0.07)
Basophils Absolute: 0.1 10*3/uL (ref 0.0–0.1)
Basophils Relative: 1 %
Eosinophils Absolute: 0.1 10*3/uL (ref 0.0–0.5)
Eosinophils Relative: 1 %
HCT: 41.4 % (ref 39.0–52.0)
Hemoglobin: 12.6 g/dL — ABNORMAL LOW (ref 13.0–17.0)
Immature Granulocytes: 0 %
Lymphocytes Relative: 49 %
Lymphs Abs: 5.3 10*3/uL — ABNORMAL HIGH (ref 0.7–4.0)
MCH: 31.1 pg (ref 26.0–34.0)
MCHC: 30.4 g/dL (ref 30.0–36.0)
MCV: 102.2 fL — ABNORMAL HIGH (ref 80.0–100.0)
Monocytes Absolute: 0.4 10*3/uL (ref 0.1–1.0)
Monocytes Relative: 4 %
Neutro Abs: 4.9 10*3/uL (ref 1.7–7.7)
Neutrophils Relative %: 45 %
Platelets: 142 10*3/uL — ABNORMAL LOW (ref 150–400)
RBC: 4.05 MIL/uL — ABNORMAL LOW (ref 4.22–5.81)
RDW: 13.1 % (ref 11.5–15.5)
WBC: 10.8 10*3/uL — ABNORMAL HIGH (ref 4.0–10.5)
nRBC: 0 % (ref 0.0–0.2)

## 2020-08-27 LAB — LACTIC ACID, PLASMA
Lactic Acid, Venous: 2.4 mmol/L (ref 0.5–1.9)
Lactic Acid, Venous: 1.5 mmol/L (ref 0.5–1.9)

## 2020-08-27 LAB — BLOOD GAS, VENOUS
Acid-Base Excess: 15.5 mmol/L — ABNORMAL HIGH (ref 0.0–2.0)
Bicarbonate: 45.3 mmol/L — ABNORMAL HIGH (ref 20.0–28.0)
O2 Saturation: 64.9 %
Patient temperature: 37
pCO2, Ven: 84 mmHg (ref 44.0–60.0)
pH, Ven: 7.34 (ref 7.250–7.430)
pO2, Ven: 36 mmHg (ref 32.0–45.0)

## 2020-08-27 LAB — URINALYSIS, COMPLETE (UACMP) WITH MICROSCOPIC
Bacteria, UA: NONE SEEN
Bilirubin Urine: NEGATIVE
Glucose, UA: NEGATIVE mg/dL
Hgb urine dipstick: NEGATIVE
Ketones, ur: NEGATIVE mg/dL
Leukocytes,Ua: NEGATIVE
Nitrite: NEGATIVE
Protein, ur: >=300 mg/dL — AB
Specific Gravity, Urine: 1.014 (ref 1.005–1.030)
Squamous Epithelial / HPF: NONE SEEN (ref 0–5)
pH: 6 (ref 5.0–8.0)

## 2020-08-27 LAB — TROPONIN I (HIGH SENSITIVITY)
Troponin I (High Sensitivity): 29 ng/L — ABNORMAL HIGH (ref <18)
Troponin I (High Sensitivity): 47 ng/L — ABNORMAL HIGH (ref <18)

## 2020-08-27 LAB — COMPREHENSIVE METABOLIC PANEL
ALT: 47 U/L — ABNORMAL HIGH (ref 0–44)
AST: 66 U/L — ABNORMAL HIGH (ref 15–41)
Albumin: 3.9 g/dL (ref 3.5–5.0)
Alkaline Phosphatase: 39 U/L (ref 38–126)
Anion gap: 14 (ref 5–15)
BUN: 56 mg/dL — ABNORMAL HIGH (ref 6–20)
CO2: 31 mmol/L (ref 22–32)
Calcium: 9.2 mg/dL (ref 8.9–10.3)
Chloride: 93 mmol/L — ABNORMAL LOW (ref 98–111)
Creatinine, Ser: 1.44 mg/dL — ABNORMAL HIGH (ref 0.61–1.24)
GFR, Estimated: 56 mL/min — ABNORMAL LOW (ref >60)
Glucose, Bld: 106 mg/dL — ABNORMAL HIGH (ref 70–99)
Potassium: 5.5 mmol/L — ABNORMAL HIGH (ref 3.5–5.1)
Sodium: 138 mmol/L (ref 135–145)
Total Bilirubin: 0.9 mg/dL (ref 0.3–1.2)
Total Protein: 7.4 g/dL (ref 6.5–8.1)

## 2020-08-27 LAB — PHOSPHORUS: Phosphorus: 4.6 mg/dL (ref 2.5–4.6)

## 2020-08-27 LAB — ETHANOL: Alcohol, Ethyl (B): <10 mg/dL (ref <10)

## 2020-08-27 LAB — PROCALCITONIN: Procalcitonin: <0.1 ng/mL

## 2020-08-27 LAB — MAGNESIUM: Magnesium: 2.1 mg/dL (ref 1.7–2.4)

## 2020-08-27 LAB — BRAIN NATRIURETIC PEPTIDE: B Natriuretic Peptide: 285 pg/mL — ABNORMAL HIGH (ref 0.0–100.0)

## 2020-08-27 LAB — RESP PANEL BY RT-PCR (FLU A&B, COVID) ARPGX2
Influenza A by PCR: NEGATIVE
Influenza B by PCR: NEGATIVE
SARS Coronavirus 2 by RT PCR: NEGATIVE

## 2020-08-27 MED ORDER — IPRATROPIUM-ALBUTEROL 0.5-2.5 (3) MG/3ML IN SOLN
3.0000 mL | Freq: Once | RESPIRATORY_TRACT | Status: AC
  Administered 2020-08-27: 3 mL via RESPIRATORY_TRACT
  Filled 2020-08-27: qty 3

## 2020-08-27 MED ORDER — ASPIRIN 81 MG PO CHEW
81.0000 mg | CHEWABLE_TABLET | Freq: Every day | ORAL | Status: DC
  Administered 2020-08-28 – 2020-09-02 (×6): 81 mg via ORAL
  Filled 2020-08-27 (×6): qty 1

## 2020-08-27 MED ORDER — SIMVASTATIN 10 MG PO TABS: 20.0000 mg | ORAL_TABLET | Freq: Every day | ORAL | Status: DC

## 2020-08-27 MED ORDER — PANTOPRAZOLE SODIUM 40 MG PO TBEC
40.0000 mg | DELAYED_RELEASE_TABLET | Freq: Every day | ORAL | Status: DC
  Administered 2020-08-28 – 2020-09-02 (×6): 40 mg via ORAL
  Filled 2020-08-27 (×6): qty 1

## 2020-08-27 MED ORDER — LORAZEPAM 2 MG/ML IJ SOLN
1.0000 mg | INTRAMUSCULAR | Status: AC | PRN
Start: 2020-08-27 — End: 2020-08-30

## 2020-08-27 MED ORDER — ONDANSETRON HCL 4 MG/2ML IJ SOLN: 4.0000 mg | Freq: Four times a day (QID) | INTRAMUSCULAR | Status: DC | PRN

## 2020-08-27 MED ORDER — IPRATROPIUM-ALBUTEROL 0.5-2.5 (3) MG/3ML IN SOLN
3.0000 mL | Freq: Four times a day (QID) | RESPIRATORY_TRACT | Status: DC
  Administered 2020-08-27 – 2020-08-30 (×10): 3 mL via RESPIRATORY_TRACT
  Filled 2020-08-27 (×10): qty 3

## 2020-08-27 MED ORDER — FUROSEMIDE 40 MG PO TABS
60.0000 mg | ORAL_TABLET | Freq: Every day | ORAL | Status: DC
  Administered 2020-08-28: 60 mg via ORAL
  Filled 2020-08-27: qty 1

## 2020-08-27 MED ORDER — METHYLPREDNISOLONE SODIUM SUCC 125 MG IJ SOLR
80.0000 mg | Freq: Every day | INTRAMUSCULAR | Status: DC
  Administered 2020-08-28 – 2020-08-31 (×4): 80 mg via INTRAVENOUS
  Filled 2020-08-27 (×4): qty 2

## 2020-08-27 MED ORDER — ALBUTEROL SULFATE HFA 108 (90 BASE) MCG/ACT IN AERS
2.0000 | INHALATION_SPRAY | Freq: Four times a day (QID) | RESPIRATORY_TRACT | Status: DC | PRN
  Filled 2020-08-27: qty 6.7

## 2020-08-27 MED ORDER — LISINOPRIL 5 MG PO TABS
5.0000 mg | ORAL_TABLET | Freq: Every day | ORAL | Status: DC
  Administered 2020-08-28 – 2020-08-31 (×4): 5 mg via ORAL
  Filled 2020-08-27 (×4): qty 1

## 2020-08-27 MED ORDER — THIAMINE HCL 100 MG PO TABS
100.0000 mg | ORAL_TABLET | Freq: Every day | ORAL | Status: DC
  Administered 2020-08-28 – 2020-09-02 (×6): 100 mg via ORAL
  Filled 2020-08-27 (×6): qty 1

## 2020-08-27 MED ORDER — ATORVASTATIN CALCIUM 20 MG PO TABS
20.0000 mg | ORAL_TABLET | Freq: Every day | ORAL | Status: DC
  Administered 2020-08-28 – 2020-09-02 (×6): 20 mg via ORAL
  Filled 2020-08-27 (×6): qty 1

## 2020-08-27 MED ORDER — ACETAMINOPHEN 650 MG RE SUPP: 650.0000 mg | Freq: Four times a day (QID) | RECTAL | Status: AC | PRN

## 2020-08-27 MED ORDER — LEVETIRACETAM 500 MG PO TABS
500.0000 mg | ORAL_TABLET | Freq: Two times a day (BID) | ORAL | Status: DC
  Administered 2020-08-28 – 2020-09-02 (×11): 500 mg via ORAL
  Filled 2020-08-27 (×13): qty 1

## 2020-08-27 MED ORDER — TIOTROPIUM BROMIDE MONOHYDRATE 18 MCG IN CAPS
1.0000 | ORAL_CAPSULE | Freq: Every day | RESPIRATORY_TRACT | Status: DC
  Administered 2020-08-29 – 2020-09-02 (×5): 18 ug via RESPIRATORY_TRACT
  Filled 2020-08-27 (×2): qty 5

## 2020-08-27 MED ORDER — LORAZEPAM 1 MG PO TABS: 1.0000 mg | ORAL_TABLET | ORAL | Status: AC | PRN

## 2020-08-27 MED ORDER — FOLIC ACID 1 MG PO TABS
1.0000 mg | ORAL_TABLET | Freq: Every day | ORAL | Status: DC
  Administered 2020-08-28 – 2020-09-02 (×6): 1 mg via ORAL
  Filled 2020-08-27 (×6): qty 1

## 2020-08-27 MED ORDER — ENOXAPARIN SODIUM 40 MG/0.4ML ~~LOC~~ SOLN
40.0000 mg | SUBCUTANEOUS | Status: DC
  Administered 2020-08-27 – 2020-09-01 (×6): 40 mg via SUBCUTANEOUS
  Filled 2020-08-27 (×6): qty 0.4

## 2020-08-27 MED ORDER — ONDANSETRON HCL 4 MG PO TABS: 4.0000 mg | ORAL_TABLET | Freq: Four times a day (QID) | ORAL | Status: DC | PRN

## 2020-08-27 MED ORDER — ACETAMINOPHEN 325 MG PO TABS: 650.0000 mg | ORAL_TABLET | Freq: Four times a day (QID) | ORAL | Status: AC | PRN

## 2020-08-27 MED ORDER — FUROSEMIDE 40 MG PO TABS: 60.0000 mg | ORAL_TABLET | Freq: Every day | ORAL | Status: DC

## 2020-08-27 MED ORDER — AMLODIPINE BESYLATE 10 MG PO TABS
10.0000 mg | ORAL_TABLET | Freq: Every day | ORAL | Status: DC
  Administered 2020-08-28 – 2020-08-29 (×2): 10 mg via ORAL
  Filled 2020-08-27 (×2): qty 1

## 2020-08-27 MED ORDER — IPRATROPIUM-ALBUTEROL 0.5-2.5 (3) MG/3ML IN SOLN: 3.0000 mL | Freq: Four times a day (QID) | RESPIRATORY_TRACT | Status: DC

## 2020-08-27 NOTE — H&P (Signed)
History and Physical   Darin Hopkins NUU:[id number] DOB: 29-Dec-1960 DOA: 08/27/2020  PCP: Martie Round, NP  Patient coming from: home/woods via EMS  I have personally briefly reviewed patient's old medical records in [facility] EMR.  Chief Concern: Shortness of breath  HPI: Darin Hopkins is a 60 y.o. male with medical history significant for hypertension, GERD, history of heart failure exacerbation, history of seizures, last seizure was approximately 2 years ago, CKD 3, hyperlipidemia, COPD, history of CVA about 2.5 years ago, alcohol abuse, tobacco abuse, presents to the emergency department for chief concerns of shortness of breath.  Patient was out walking in the woods when he developed shortness of breath.  He did not take his oxygen with him.  He states he was walking for less than 30 minutes.  He denies fever, dysuria, hematuria, nausea, vomiting.  He endorses a dry cough.  She denies syncope or loss of consciousness or rashes on his body. He endorses bilateral lower extremity swelling however states that this has improved significantly since he was started on medications from previous hospitalization.  Social history: He lives with his sister.  He endorses daily tobacco use.  He is trying to quit EtOH use formally he drank 1/5 of alcohol per day and smokes half a pack per day.  He states that the last time he had an alcoholic beverage was prior to presentation to the ED on 08/17/2020.  He denies recreational drug use.  He does not work due to current disability.  Vaccination: He is vaccinated for COVID-19, 3 doses  ROS: Constitutional: no weight change, no fever ENT/Mouth: no sore throat, no rhinorrhea Eyes: no eye pain, no vision changes Cardiovascular: no chest pain, + dyspnea,  + edema, no palpitations Respiratory: no cough, no sputum, no wheezing Gastrointestinal: no nausea, no vomiting, no diarrhea, no constipation Genitourinary: no urinary incontinence, no dysuria, no  hematuria Musculoskeletal: no arthralgias, no myalgias Skin: no skin lesions, no pruritus, Neuro: + weakness, no loss of consciousness, no syncope Psych: no anxiety, no depression, + decrease appetite Heme/Lymph: no bruising, no bleeding  ED Course: Discussed with ED provider, patient requiring hospitalization for shortness of breath requiring BiPAP.  Vitals in the emergency department show temperature 97.6, respiration rate of 14, heart rate 56, blood pressure 142/86, satting at 99% on 4 L nasal cannula.  Patient was reported to using accessory muscles with reduced nasal cannula and VBG was obtained which showed 7.3 4/84/36.  He was placed on BiPAP.  Other labs included WBC 10.8, hemoglobin 12.6, platelets 142, UA showed protein positive, AST 66, ALT 47, sodium 138, potassium 5.5, chloride 93, 131, albumin 57, serum creatinine 1.44, nonfasting blood glucose 106.  Per notes in EHR, EMS had given patient Solu-Medrol 125 mg IV, 1 treatment of DuoNeb's, vitals with EMS was 119/82, CO2 40, respiration rate of 18, SPO2 97% on 4 L nasal cannula, heart rate 68.  EDP gave patient 2 additional duo nebs treatment.  Initial lactic acid was 2.4 and improved to 1.5 per ED  High-sensitivity troponin was 14, compared to high-sensitivity troponin on 08/22/2020 which was 44  Assessment/Plan  Principal Problem:   COPD exacerbation (HCC) Active Problems:   Essential hypertension   History of seizure   COPD (chronic obstructive pulmonary disease) (HCC)   Hyperlipidemia   GERD (gastroesophageal reflux disease)   Alcohol abuse   Acute respiratory failure with hypoxia (HCC)   Protein-calorie malnutrition, severe   Shortness of breath-suspect secondary to COPD exacerbation in  setting of exercise intolerance - CO2 on VBG was 84, query chronic retainer - Status post Solu-Medrol 125 mg IV per EMS, 3 DuoNeb treatment - Patient is not wearing oxygen per sister  - Continue BiPAP - Check Pro-Cal and  BNP - DuoNebs 4 times daily, Solu-Medrol 80 mils times daily starting 08/28/2020 - Resumed home albuterol 2 puffs inhalation every 6 hours.  For wheezing and shortness of breath  Hypertension-Resumed home amlodipine 10 mg daily, lisinopril 5 mg daily  Hyperlipidemia-resumed home simvastatin 20 mg nightly  History of seizures- patient took his a.m. Keppra dose already - Resumed Keppra 500 mg twice daily, with first dose at 1800  CKD 3a-at baseline, serum creatinine on presentation is 1.44 which is in patient's normal range  Bilateral lower extremity edema- Lasix 60 mg p.o. daily resumed  GERD- PPI ordered  Likely dementia - in setting of chronic daily etoh use for many years  Alcohol abuse- CIWA protocol - Folic and thiamine ordered  TOC consult - Sister states that he is not wearing his O2 at home is not participating in his care. She states that she can not handle him any longer  Chart reviewed.  Hospitalization from 08/17/2020 to 08/25/2020: For acute exacerbation of CHF.  BNP was elevated at that time at 1306, echo was performed and showed normal EF and normal diastolic function.  He was hypoxic on admission requiring additional BiPAP.  And improved to 2 to 4 L nasal cannula oxygen supplementation.  Repeat chest x-ray with Marina Goodell bronchial thickening and concern for atypical pneumonia.  He underwent Myoview on 08/22/2020 which showed low risk, probably normal with a small basal inferior fixed defect most likely representing artifact but unable to exclude small region of scar.  Cardiology did not believe that this was coronary insufficiency contributing to dyspnea.  Patient was discharged and recommended pulmonary evaluation outpatient for COPD.  PT/OT was ordered and recommended SNF however unable to go to SNF due to insurance issues.  His sister has agreed to help him at home.  Home health services were ordered.  Home oxygen was ordered.  Patient medically stable and was discharged on  08/25/2020.  DVT prophylaxis: Enoxaparin 40 mg subcutaneous every 24 hours Code Status: Full code Diet: Heart healthy Family Communication: Per patient request, he states he does not require communication with his sister as she knows he is here Disposition Plan: Pending clinical course Consults called: None at this time Admission status: Observation, MedSurg, with telemetry  Past Medical History:  Diagnosis Date  . COPD (chronic obstructive pulmonary disease) (HCC)   . Hypertension    History reviewed. No pertinent surgical history.  Social History:  reports that he has been smoking cigarettes. He has been smoking about 2.00 packs per day. He has never used smokeless tobacco. He reports current alcohol use of about 3.0 standard drinks of alcohol per week. He reports previous drug use.  No Known Allergies No family history on file. Family history: Family history reviewed and not pertinent  Prior to Admission medications   Medication Sig Start Date End Date Taking? Authorizing Provider  amLODipine (NORVASC) 10 MG tablet Take 1 tablet by mouth daily. 07/28/20   [provider]  aspirin 81 MG EC tablet Take by mouth.    [provider]  atorvastatin (LIPITOR) 20 MG tablet Take 1 tablet by mouth daily. 07/28/20   [provider]  doxazosin (CARDURA) 2 MG tablet Take 2 mg by mouth daily. 07/28/20   [provider]  folic acid (FOLVITE) 1 MG tablet Take 1 mg by mouth daily. 04/17/20   [provider]  furosemide (LASIX) 20 MG tablet Take 3 tablets (60 mg total) by mouth daily. 08/26/20   Marrion CoyZhang, Dekui, MD  levETIRAcetam (KEPPRA) 500 MG tablet Take 500 mg by mouth 2 (two) times daily. 07/28/20   [provider]  lisinopril (ZESTRIL) 5 MG tablet Take 5 mg by mouth daily. 07/28/20   [provider]  omeprazole (PRILOSEC) 20 MG capsule Take 1 capsule by mouth daily. 07/28/20   [provider]  PROAIR HFA 108 (90 Base) MCG/ACT  inhaler Inhale into the lungs. 07/28/20   [provider]  SPIRIVA HANDIHALER 18 MCG inhalation capsule 1 capsule daily. 07/28/20   [provider]  thiamine 100 MG tablet Take 1 tablet (100 mg total) by mouth daily. 08/26/20   Marrion CoyZhang, Dekui, MD   Physical Exam: Vitals:   08/27/20 1100 08/27/20 1200 08/27/20 1310 08/27/20 1347  BP: 118/89 125/82 (!) 142/86   Pulse: 68 (!) 57 (!) 56 60  Resp: 17 16 14 19   Temp:      TempSrc:      SpO2: 96% 94% 99% 97%  Weight:      Height:       Constitutional: appears older than chronological age, NAD, calm, comfortable Eyes: PERRL, lids and conjunctivae normal ENMT: Mucous membranes are moist. Posterior pharynx clear of any exudate or lesions. Age-appropriate dentition. Hearing appropriate Neck: normal, supple, no masses, no thyromegaly Respiratory: clear to auscultation bilaterally, no wheezing, no crackles. Normal respiratory effort. No accessory muscle use.  Cardiovascular: Regular rate and rhythm, no murmurs / rubs / gallops. No extremity edema. 2+ pedal pulses. No carotid bruits.  Abdomen: no tenderness, no masses palpated, no hepatosplenomegaly. Bowel sounds positive.  Musculoskeletal: no clubbing / cyanosis. No joint deformity upper and lower extremities. Good ROM, no contractures, no atrophy. Normal muscle tone.  Skin: no rashes, lesions, ulcers. No induration Neurologic: Sensation intact. Strength 5/5 in all 4.  Psychiatric: Normal judgment and insight. Alert and oriented x 3. Normal mood.   EKG: independently reviewed, showing sinus rhythm with a rate of 67, QTc 462  Chest x-ray on Admission: I personally reviewed and I agree with radiologist reading as below.  DG Chest 2 View  Result Date: 08/27/2020 CLINICAL DATA:  Cough, shortness of breath. EXAM: CHEST - 2 VIEW COMPARISON:  August 19, 2020. FINDINGS: The heart size and mediastinal contours are within normal limits. Both lungs are clear. No pneumothorax or pleural effusion  is noted. The visualized skeletal structures are unremarkable. IMPRESSION: No active cardiopulmonary disease. Electronically Signed   By: Lupita RaiderJames  Green Jr M.D.   On: 08/27/2020 11:59   CT Head Wo Contrast  Result Date: 08/27/2020 CLINICAL DATA:  Delirium. EXAM: CT HEAD WITHOUT CONTRAST TECHNIQUE: Contiguous axial images were obtained from the base of the skull through the vertex without intravenous contrast. COMPARISON:  November 01, 2013. 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 No mass effect or midline shift is noted. Ventricular size is within normal limits. There is no evidence of mass lesion, hemorrhage or acute infarction. Vascular: No hyperdense vessel or unexpected calcification. Skull: Normal. Negative for fracture or focal lesion. Sinuses/Orbits: No acute finding. Other: None. IMPRESSION: No acute intracranial abnormality seen. Electronically Signed   By: Lupita RaiderJames  Green Jr M.D.   On: 08/27/2020 12:52   Labs on Admission: I have personally reviewed following labs  CBC: Recent Labs  Lab  08/27/20 1052  WBC 10.8*  NEUTROABS 4.9  HGB 12.6*  HCT 41.4  MCV 102.2*  PLT 142*   Basic Metabolic Panel: Recent Labs  Lab 08/21/20 0415 08/22/20 0426 08/23/20 0416 08/23/20 1747 08/24/20 0437 08/25/20 0417 08/27/20 1052  NA 140 139 139 139 140 139 138  K 4.5 4.7 5.0 5.0 5.1 5.1 5.5*  CL 98 92* 91* 91* 89* 89* 93*  CO2 35* 40* 40* 40* 41* 42* 31  GLUCOSE 103* 105* 97 125* 94 90 106*  BUN 35* 47* 63* 61* 61* 67* 56*  CREATININE 1.52* 1.64* 1.45* 1.55* 1.49* 1.58* 1.44*  CALCIUM 8.4* 8.5* 8.8* 8.6* 8.9 9.1 9.2  MG 1.8 2.0 2.0  --  2.1 2.2  --   PHOS  --   --  1.2* 3.5 3.1 3.2  --    GFR: Estimated Creatinine Clearance: 47.8 mL/min (A) (by C-G formula based on SCr of 1.44 mg/dL (H)).  Liver Function Tests: Recent Labs  Lab 08/23/20 0416 08/23/20 1747 08/24/20 0437 08/25/20 0417 08/27/20 1052  AST  --   --   --   --  66*  ALT  --   --    --   --  47*  ALKPHOS  --   --   --   --  39  BILITOT  --   --   --   --  0.9  PROT  --   --   --   --  7.4  ALBUMIN 3.2* 3.3* 3.2* 3.3* 3.9   Urine analysis:    Component Value Date/Time   COLORURINE YELLOW (A) 08/27/2020 1052   APPEARANCEUR HAZY (A) 08/27/2020 1052   APPEARANCEUR Clear 11/02/2013 0205   LABSPEC 1.014 08/27/2020 1052   LABSPEC 1.005 11/02/2013 0205   PHURINE 6.0 08/27/2020 1052   GLUCOSEU NEGATIVE 08/27/2020 1052   GLUCOSEU Negative 11/02/2013 0205   HGBUR NEGATIVE 08/27/2020 1052   BILIRUBINUR NEGATIVE 08/27/2020 1052   BILIRUBINUR Negative 11/02/2013 0205   KETONESUR NEGATIVE 08/27/2020 1052   PROTEINUR >=300 (A) 08/27/2020 1052   NITRITE NEGATIVE 08/27/2020 1052   LEUKOCYTESUR NEGATIVE 08/27/2020 1052   LEUKOCYTESUR Negative 11/02/2013 0205   Joliet Mallozzi N Danicka Hourihan D.O. [facility]  If 7PM-7AM, please contact overnight-coverage provider If 7AM-7PM, please contact day coverage provider [email or web address]  08/27/2020, 1:57 PM

## 2020-08-27 NOTE — ED Notes (Signed)
Pt states that he took all of his daily medications this morning as prescribed and does not need to take additional dose right now. Pharmacist notified.

## 2020-08-27 NOTE — ED Notes (Signed)
RT notified to place pt on BiPap

## 2020-08-27 NOTE — ED Triage Notes (Signed)
Pt to ER via [facility] with reports of increased shortness of breath after walking in the woods this morning. Pt with COPD on 4L via [place] chronic. Pt discharged from hospital on Monday for COPD exacerbation.   Ems gave 125mg  solumedorol IV and administered 1 duoneb. Pt denies pain.  EMS VSS- bp 119/82, CO2 40, RR 18, O2 sats 97% 4L, Hr 68

## 2020-08-27 NOTE — ED Provider Notes (Signed)
[facility] Emergency Department Provider Note  ____________________________________________   Event Date/Time   First MD Initiated Contact with Patient 08/27/20 1106     (approximate)  I have reviewed the triage vital signs and the nursing notes.   HISTORY  Chief Complaint Shortness of Breath    HPI Darin Hopkins is a 60 y.o. male presents emergency department with increased shortness of breath after walking ambulance this morning.  Patient has history of COPD and is on 4 L nasal cannula at home.  He was recently discharged from the hospital on Monday.  He had a COPD exasperation at that time.  Patient states he has felt like he has a fever.  He is a little confused when asking if he used any inhalers or had a treatment from EMS as he does not remember.  He denies chest pain.  Denies vomiting or diarrhea.    Past Medical History:  Diagnosis Date  . COPD (chronic obstructive pulmonary disease) (HCC)   . Hypertension     Patient Active Problem List   Diagnosis Date Noted  . Protein-calorie malnutrition, severe 08/21/2020  . Acute respiratory failure with hypoxia (HCC)   . Acute decompensated heart failure (HCC) 08/18/2020  . Acute exacerbation of CHF (congestive heart failure) (HCC) 08/17/2020  . Essential hypertension 08/17/2020  . History of seizure 08/17/2020  . COPD (chronic obstructive pulmonary disease) (HCC) 08/17/2020  . Hyperlipidemia 08/17/2020  . GERD (gastroesophageal reflux disease) 08/17/2020  . Alcohol abuse 08/17/2020    History reviewed. No pertinent surgical history.  Prior to Admission medications   Medication Sig Start Date End Date Taking? Authorizing Provider  amLODipine (NORVASC) 10 MG tablet Take 1 tablet by mouth daily. 07/28/20   [provider]  aspirin 81 MG EC tablet Take by mouth.    [provider]  atorvastatin (LIPITOR) 20 MG tablet Take 1 tablet by mouth daily. 07/28/20   [provider]  doxazosin (CARDURA) 2 MG tablet Take 2 mg by mouth daily. 07/28/20   [provider]  folic acid (FOLVITE) 1 MG tablet Take 1 mg by mouth daily. 04/17/20   [provider]  furosemide (LASIX) 20 MG tablet Take 3 tablets (60 mg total) by mouth daily. 08/26/20   Marrion Coy, MD  levETIRAcetam (KEPPRA) 500 MG tablet Take 500 mg by mouth 2 (two) times daily. 07/28/20   [provider]  lisinopril (ZESTRIL) 5 MG tablet Take 5 mg by mouth daily. 07/28/20   [provider]  omeprazole (PRILOSEC) 20 MG capsule Take 1 capsule by mouth daily. 07/28/20   [provider]  PROAIR HFA 108 (90 Base) MCG/ACT inhaler Inhale into the lungs. 07/28/20   [provider]  SPIRIVA HANDIHALER 18 MCG inhalation capsule 1 capsule daily. 07/28/20   [provider]  thiamine 100 MG tablet Take 1 tablet (100 mg total) by mouth daily. 08/26/20   Marrion Coy, MD    Allergies Patient has no known allergies.  No family history on file.  Social History Social History   Tobacco Use  . Smoking status: Current Every Day Smoker    Packs/day: 2.00    Types: Cigarettes  . Smokeless tobacco: Never Used  Vaping Use  . Vaping Use: Never used  Substance Use Topics  . Alcohol use: Yes    Alcohol/week: 3.0 standard drinks    Types: 3 Cans of beer per week    Comment: daily  . Drug use: Not Currently  Review of Systems  Constitutional: No fever/chills Eyes: No visual changes. ENT: No sore throat. Respiratory: Positive shortness of breath/cough Cardiovascular: Denies chest pain Gastrointestinal: Denies abdominal pain Genitourinary: Negative for dysuria. Musculoskeletal: Negative for back pain. Skin: Negative for rash. Psychiatric: no mood changes,     ____________________________________________   PHYSICAL EXAM:  VITAL SIGNS: ED Triage Vitals  Enc Vitals Group     BP 08/27/20 1050 106/81     Pulse Rate 08/27/20 1048 77     Resp 08/27/20 1048  18     Temp 08/27/20 1050 97.6 F (36.4 C)     Temp Source 08/27/20 1050 Oral     SpO2 08/27/20 1051 100 %     Weight 08/27/20 1048 135 lb (61.2 kg)     Height 08/27/20 1048 5\' 7"  (1.702 m)     Head Circumference --      Peak Flow --      Pain Score --      Pain Loc --      Pain Edu? --      Excl. in GC? --     Constitutional: Alert and oriented. Well appearing and in no acute distress.  Patient has a shirt on backwards, question of stool is noted on the collar, patient also smells of urine Eyes: Conjunctivae are normal.  Head: Atraumatic. Nose: No congestion/rhinnorhea. Mouth/Throat: Mucous membranes are moist.   Neck:  supple no lymphadenopathy noted Cardiovascular: Normal rate, regular rhythm. Heart sounds are normal Respiratory: Normal respiratory effort.  No retractions, lungs with decreased breath sounds Abd: soft nontender bs normal all 4 quad GU: deferred Musculoskeletal: FROM all extremities, warm and well perfused Neurologic:  Normal speech and language.  Skin:  Skin is warm, dry and intact. No rash noted. Psychiatric: Mood and affect are normal. Speech and behavior are normal.  ____________________________________________   LABS (all labs ordered are listed, but only abnormal results are displayed)  Labs Reviewed  COMPREHENSIVE METABOLIC PANEL - Abnormal; Notable for the following components:      Result Value   Potassium 5.5 (*)    Chloride 93 (*)    Glucose, Bld 106 (*)    BUN 56 (*)    Creatinine, Ser 1.44 (*)    AST 66 (*)    ALT 47 (*)    GFR, Estimated 56 (*)    All other components within normal limits  LACTIC ACID, PLASMA - Abnormal; Notable for the following components:   Lactic Acid, Venous 2.4 (*)    All other components within normal limits  CBC WITH DIFFERENTIAL/PLATELET - Abnormal; Notable for the following components:   WBC 10.8 (*)    RBC 4.05 (*)    Hemoglobin 12.6 (*)    MCV 102.2 (*)    Platelets 142 (*)    Lymphs Abs 5.3 (*)     All other components within normal limits  URINALYSIS, COMPLETE (UACMP) WITH MICROSCOPIC - Abnormal; Notable for the following components:   Color, Urine YELLOW (*)    APPearance HAZY (*)    Protein, ur >=300 (*)    All other components within normal limits  BLOOD GAS, VENOUS - Abnormal; Notable for the following components:   pCO2, Ven 84 (*)    Bicarbonate 45.3 (*)    Acid-Base Excess 15.5 (*)    All other components within normal limits  TROPONIN I (HIGH SENSITIVITY) - Abnormal; Notable for the following components:   Troponin I (High Sensitivity) 29 (*)    All other  components within normal limits  TROPONIN I (HIGH SENSITIVITY) - Abnormal; Notable for the following components:   Troponin I (High Sensitivity) 47 (*)    All other components within normal limits  RESP PANEL BY RT-PCR (FLU A&B, COVID) ARPGX2  LACTIC ACID, PLASMA  ETHANOL   ____________________________________________   ____________________________________________  RADIOLOGY  Chest x-ray  ____________________________________________   PROCEDURES  Procedure(s) performed: No  .Critical Care Performed by: Faythe GheeFisher, Aysiah Jurado W, PA-C Authorized by: Faythe GheeFisher, Dakarai Mcglocklin W, PA-C   Critical care provider statement:    Critical care time (minutes):  60   Critical care time was exclusive of:  Separately billable procedures and treating other patients   Critical care was necessary to treat or prevent imminent or life-threatening deterioration of the following conditions:  Respiratory failure   Critical care was time spent personally by me on the following activities:  Blood draw for specimens, development of treatment plan with patient or surrogate, evaluation of patient's response to treatment, examination of patient, ordering and performing treatments and interventions, ordering and review of laboratory studies, ordering and review of radiographic studies, pulse oximetry, re-evaluation of patient's condition and review of old  charts      ____________________________________________   INITIAL IMPRESSION / ASSESSMENT AND PLAN / ED COURSE  Pertinent labs & imaging results that were available during my care of the patient were reviewed by me and considered in my medical decision making (see chart for details).   Patient is a 60 year old male with history of COPD presents with increased shortness of breath after being outside earlier today.  See HPI.  Physical exam shows patient stable at this time  DDx: Sepsis, CAP, COPD exacerbation, MI, UTI   Plan at this time is to evaluate labs and imaging to see if patient needs to be admitted.  Labs are concerning, blood gas shows PCO2 84, bicarb 45.3, acid-base 15.5, urinalysis is showing greater than 300 protein, troponins elevated at 29 which is actually lower than his number was at the beginning of the week, CBC is elevated WBC of 10.8, comprehensive metabolic panel shows chloride at 93, potassium is increased to 5.5, BUN is stable from his past labs, EtOH is normal, second lactic acid returned to normal at 1.5 the first was elevated  Chest x-ray is normal, reviewed by me and confirmed by radiology  CT of the head reviewed by me, confirmed by radiology that there is no change  Due to the CO2 level, patient was placed on BiPAP.  Plan is to admit.  Consult to hospitalist. Hospitalist Dr Sedalia Mutaox admitting  Leandrew KoyanagiWillie L Winget was evaluated in Emergency Department on 08/27/2020 for the symptoms described in the history of present illness. He was evaluated in the context of the global COVID-19 pandemic, which necessitated consideration that the patient might be at risk for infection with the SARS-CoV-2 virus that causes COVID-19. Institutional protocols and algorithms that pertain to the evaluation of patients at risk for COVID-19 are in a state of rapid change based on information released by regulatory bodies including the CDC and federal and state organizations. These policies  and algorithms were followed during the patient's care in the ED.    As part of my medical decision making, I reviewed the following data within the electronic MEDICAL RECORD NUMBER Nursing notes reviewed and incorporated, Labs reviewed , EKG interpreted NSR, Old chart reviewed, Radiograph reviewed , Discussed with admitting physician , Evaluated by EM attending dr Erma HeritageIsaacs, Notes from prior ED visits and [place] Controlled Substance  Database  ____________________________________________   FINAL CLINICAL IMPRESSION(S) / ED DIAGNOSES  Final diagnoses:  COPD exacerbation (HCC)  Hypoxia  Respiratory distress      NEW MEDICATIONS STARTED DURING THIS VISIT:  New Prescriptions   No medications on file     Note:  This document was prepared using Dragon voice recognition software and may include unintentional dictation errors.    Faythe Ghee, PA-C 08/27/20 1352    Shaune Pollack, MD 08/31/20 [id number]

## 2020-08-28 DIAGNOSIS — R0603 Acute respiratory distress: Secondary | ICD-10-CM | POA: Diagnosis present

## 2020-08-28 DIAGNOSIS — F17210 Nicotine dependence, cigarettes, uncomplicated: Secondary | ICD-10-CM | POA: Diagnosis present

## 2020-08-28 DIAGNOSIS — J9611 Chronic respiratory failure with hypoxia: Secondary | ICD-10-CM | POA: Diagnosis not present

## 2020-08-28 DIAGNOSIS — Z79899 Other long term (current) drug therapy: Secondary | ICD-10-CM | POA: Diagnosis not present

## 2020-08-28 DIAGNOSIS — J9621 Acute and chronic respiratory failure with hypoxia: Secondary | ICD-10-CM | POA: Diagnosis present

## 2020-08-28 DIAGNOSIS — F1010 Alcohol abuse, uncomplicated: Secondary | ICD-10-CM | POA: Diagnosis not present

## 2020-08-28 DIAGNOSIS — N1831 Chronic kidney disease, stage 3a: Secondary | ICD-10-CM | POA: Diagnosis not present

## 2020-08-28 DIAGNOSIS — Z8673 Personal history of transient ischemic attack (TIA), and cerebral infarction without residual deficits: Secondary | ICD-10-CM | POA: Diagnosis not present

## 2020-08-28 DIAGNOSIS — J9601 Acute respiratory failure with hypoxia: Secondary | ICD-10-CM | POA: Diagnosis not present

## 2020-08-28 DIAGNOSIS — Z20822 Contact with and (suspected) exposure to covid-19: Secondary | ICD-10-CM | POA: Diagnosis present

## 2020-08-28 DIAGNOSIS — I509 Heart failure, unspecified: Secondary | ICD-10-CM | POA: Diagnosis present

## 2020-08-28 DIAGNOSIS — E875 Hyperkalemia: Secondary | ICD-10-CM | POA: Diagnosis not present

## 2020-08-28 DIAGNOSIS — E43 Unspecified severe protein-calorie malnutrition: Secondary | ICD-10-CM | POA: Diagnosis present

## 2020-08-28 DIAGNOSIS — K219 Gastro-esophageal reflux disease without esophagitis: Secondary | ICD-10-CM | POA: Diagnosis present

## 2020-08-28 DIAGNOSIS — E785 Hyperlipidemia, unspecified: Secondary | ICD-10-CM | POA: Diagnosis present

## 2020-08-28 DIAGNOSIS — Z7982 Long term (current) use of aspirin: Secondary | ICD-10-CM | POA: Diagnosis not present

## 2020-08-28 DIAGNOSIS — R7401 Elevation of levels of liver transaminase levels: Secondary | ICD-10-CM | POA: Diagnosis present

## 2020-08-28 DIAGNOSIS — F0390 Unspecified dementia without behavioral disturbance: Secondary | ICD-10-CM | POA: Diagnosis present

## 2020-08-28 DIAGNOSIS — I130 Hypertensive heart and chronic kidney disease with heart failure and stage 1 through stage 4 chronic kidney disease, or unspecified chronic kidney disease: Secondary | ICD-10-CM | POA: Diagnosis present

## 2020-08-28 DIAGNOSIS — J441 Chronic obstructive pulmonary disease with (acute) exacerbation: Secondary | ICD-10-CM | POA: Diagnosis not present

## 2020-08-28 LAB — CBC
HCT: 38.2 % — ABNORMAL LOW (ref 39.0–52.0)
Hemoglobin: 12.1 g/dL — ABNORMAL LOW (ref 13.0–17.0)
MCH: 31.3 pg (ref 26.0–34.0)
MCHC: 31.7 g/dL (ref 30.0–36.0)
MCV: 98.7 fL (ref 80.0–100.0)
Platelets: 162 10*3/uL (ref 150–400)
RBC: 3.87 MIL/uL — ABNORMAL LOW (ref 4.22–5.81)
RDW: 13.2 % (ref 11.5–15.5)
WBC: 8.5 10*3/uL (ref 4.0–10.5)
nRBC: 0 % (ref 0.0–0.2)

## 2020-08-28 LAB — BASIC METABOLIC PANEL
Anion gap: 11 (ref 5–15)
BUN: 61 mg/dL — ABNORMAL HIGH (ref 6–20)
CO2: 35 mmol/L — ABNORMAL HIGH (ref 22–32)
Calcium: 9.3 mg/dL (ref 8.9–10.3)
Chloride: 93 mmol/L — ABNORMAL LOW (ref 98–111)
Creatinine, Ser: 1.45 mg/dL — ABNORMAL HIGH (ref 0.61–1.24)
GFR, Estimated: 56 mL/min — ABNORMAL LOW (ref >60)
Glucose, Bld: 86 mg/dL (ref 70–99)
Potassium: 5.3 mmol/L — ABNORMAL HIGH (ref 3.5–5.1)
Sodium: 139 mmol/L (ref 135–145)

## 2020-08-28 MED ORDER — SODIUM ZIRCONIUM CYCLOSILICATE 10 G PO PACK
10.0000 g | PACK | Freq: Once | ORAL | Status: AC
  Administered 2020-08-28: 10 g via ORAL
  Filled 2020-08-28: qty 1

## 2020-08-28 NOTE — Progress Notes (Signed)
PT Cancellation Note  Patient Details Name: Darin Hopkins MRN: [id number] DOB: 04-08-1961   Cancelled Treatment:    Reason Eval/Treat Not Completed: Medical issues which prohibited therapy. Consult received and chart reviewed. Patient noted with critically elevated K+ (5.3) , per PT practice guidelines contraindicated for exertional activity at this time.  Will continue efforts next date pending medical stability and appropriateness.  Olga Coaster PT, DPT 9:33 AM,08/28/20

## 2020-08-28 NOTE — Progress Notes (Signed)
OT Cancellation Note  Patient Details Name: Darin Hopkins MRN: [id number] DOB: [birth year]   Cancelled Treatment:    Reason Eval/Treat Not Completed: Medical issues which prohibited therapy. Consult received, chart reviewed. Patient noted with critically elevated K+ (5.3) , per OT practice guidelines contraindicated for exertional activity at this time. Will continue efforts next date pending medical stability and appropriateness.  Wynona Canes, MPH, MS, OTR/L ascom [phone] 08/28/20, 11:38 AM

## 2020-08-28 NOTE — Progress Notes (Signed)
PROGRESS NOTE    Darin Hopkins  KGU:[id number] DOB: 1961/04/03 DOA: 08/27/2020 PCP: Martie Round, NP  Assessment & Plan:   Principal Problem:   COPD exacerbation (HCC) Active Problems:   Essential hypertension   History of seizure   COPD (chronic obstructive pulmonary disease) (HCC)   Hyperlipidemia   GERD (gastroesophageal reflux disease)   Alcohol abuse   Acute respiratory failure with hypoxia (HCC)   Protein-calorie malnutrition, severe     Dyspnea: etiology unclear, possible COPD exacerbation. Continue on IV steroids, bronchodilators. Encourage incentive spirometry   Chronic hypoxic respiratory failure: continue on supplemental oxygen and wean back to baseline as tolerated. Uses 4L [place] at home but has been non-complaint w/ oxygen at home recently   HTN: continue on lisinopril, amlodipine   HLD: continue on statin   Hx of seizures: continue on home dose of keppra  CKDIIIa: Cr is trending up slightly from day prior. Will continue to monitor closely while in lasix   Hyperkalemia: lokelma x 1. Will continue to monitor   B/l LE edema: continue on home dose of lasix   GERD: continue on PPI   Alcohol abuse: continue on CIWA protocol. Continue on folic and thiamine. Alcohol cessation counseling   Transaminitis: likely secondary to alcohol abuse     DVT prophylaxis: lovenox Code Status: full  Family Communication:  Disposition Plan: depends on PT/OT recs   Level of care: Med-Surg   Status is: Inpatient  Remains inpatient appropriate because:Ongoing diagnostic testing needed not appropriate for outpatient work up, Unsafe d/c plan and IV treatments appropriate due to intensity of illness or inability to take PO   Dispo: The patient is from: Home              Anticipated d/c is to: Home vs SNF               Patient currently is not medically stable to d/c.   Difficult to place patient Yes         Consultants:      Procedures:    Antimicrobials:   Subjective: Pt c/o shortness of breath   Objective: Vitals:   08/27/20 2040 08/27/20 2054 08/28/20 0423 08/28/20 0452  BP: 130/90  136/64 123/90  Pulse: 62  65 (!) 54  Resp: 16  16 16   Temp: 98.2 F (36.8 C)  98.2 F (36.8 C) 97.6 F (36.4 C)  TempSrc: Oral   Oral  SpO2: 100% 93% 97% 99%  Weight:      Height:        Intake/Output Summary (Last 24 hours) at 08/28/2020 0725 Last data filed at 08/28/2020 0318 Gross per 24 hour  Intake 240 ml  Output 100 ml  Net 140 ml   Filed Weights   08/27/20 1048  Weight: 61.2 kg    Examination:  General exam: Appears calm and comfortable  Respiratory system: diminished breath sounds b/l. Scattered wheezes b/l Cardiovascular system: S1 & S2 +. No  rubs, gallops or clicks.  Gastrointestinal system: Abdomen is nondistended, soft and nontender. Normal bowel sounds heard. Central nervous system: Alert and oriented. Moves all extremities  Psychiatry: Judgement and insight appear normal. Flat mood and affect     Data Reviewed: I have personally reviewed following labs and imaging studies  CBC: Recent Labs  Lab 08/27/20 1052 08/28/20 0519  WBC 10.8* 8.5  NEUTROABS 4.9  --   HGB 12.6* 12.1*  HCT 41.4 38.2*  MCV 102.2* 98.7  PLT 142* 162  Basic Metabolic Panel: Recent Labs  Lab 08/22/20 0426 08/23/20 0416 08/23/20 1747 08/24/20 0437 08/25/20 0417 08/27/20 1052 08/27/20 1304 08/28/20 0519  NA 139 139 139 140 139 138  --  139  K 4.7 5.0 5.0 5.1 5.1 5.5*  --  5.3*  CL 92* 91* 91* 89* 89* 93*  --  93*  CO2 40* 40* 40* 41* 42* 31  --  35*  GLUCOSE 105* 97 125* 94 90 106*  --  86  BUN 47* 63* 61* 61* 67* 56*  --  61*  CREATININE 1.64* 1.45* 1.55* 1.49* 1.58* 1.44*  --  1.45*  CALCIUM 8.5* 8.8* 8.6* 8.9 9.1 9.2  --  9.3  MG 2.0 2.0  --  2.1 2.2  --  2.1  --   PHOS  --  1.2* 3.5 3.1 3.2  --  4.6  --    GFR: Estimated Creatinine Clearance: 47.5 mL/min (A) (by C-G formula based on SCr of 1.45 mg/dL  (H)). Liver Function Tests: Recent Labs  Lab 08/23/20 0416 08/23/20 1747 08/24/20 0437 08/25/20 0417 08/27/20 1052  AST  --   --   --   --  66*  ALT  --   --   --   --  47*  ALKPHOS  --   --   --   --  39  BILITOT  --   --   --   --  0.9  PROT  --   --   --   --  7.4  ALBUMIN 3.2* 3.3* 3.2* 3.3* 3.9   No results for input(s): LIPASE, AMYLASE in the last 168 hours. No results for input(s): AMMONIA in the last 168 hours. Coagulation Profile: No results for input(s): INR, PROTIME in the last 168 hours. Cardiac Enzymes: No results for input(s): CKTOTAL, CKMB, CKMBINDEX, TROPONINI in the last 168 hours. BNP (last 3 results) No results for input(s): PROBNP in the last 8760 hours. HbA1C: No results for input(s): HGBA1C in the last 72 hours. CBG: No results for input(s): GLUCAP in the last 168 hours. Lipid Profile: No results for input(s): CHOL, HDL, LDLCALC, TRIG, CHOLHDL, LDLDIRECT in the last 72 hours. Thyroid Function Tests: No results for input(s): TSH, T4TOTAL, FREET4, T3FREE, THYROIDAB in the last 72 hours. Anemia Panel: No results for input(s): VITAMINB12, FOLATE, FERRITIN, TIBC, IRON, RETICCTPCT in the last 72 hours. Sepsis Labs: Recent Labs  Lab 08/27/20 1144 08/27/20 1304  PROCALCITON  --  <0.10  LATICACIDVEN 2.4* 1.5    Recent Results (from the past 240 hour(s))  Resp Panel by RT-PCR (Flu A&B, Covid) Nasopharyngeal Swab     Status: None   Collection Time: 08/27/20  2:10 PM   Specimen: Nasopharyngeal Swab; Nasopharyngeal(NP) swabs in vial transport medium  Result Value Ref Range Status   SARS Coronavirus 2 by RT PCR NEGATIVE NEGATIVE Final    Comment: (NOTE) SARS-CoV-2 target nucleic acids are NOT DETECTED.  The SARS-CoV-2 RNA is generally detectable in upper respiratory specimens during the acute phase of infection. The lowest concentration of SARS-CoV-2 viral copies this assay can detect is 138 copies/mL. A negative result does not preclude  SARS-Cov-2 infection and should not be used as the sole basis for treatment or other patient management decisions. A negative result may occur with  improper specimen collection/handling, submission of specimen other than nasopharyngeal swab, presence of viral mutation(s) within the areas targeted by this assay, and inadequate number of viral copies(<138 copies/mL). A negative result must be combined with  clinical observations, patient history, and epidemiological information. The expected result is Negative.  Fact Sheet for Patients:  BloggerCourse.comhttps://www.fda.gov/media/152166/download  Fact Sheet for Healthcare Providers:  SeriousBroker.ithttps://www.fda.gov/media/152162/download  This test is no t yet approved or cleared by the [place] FDA and  has been authorized for detection and/or diagnosis of SARS-CoV-2 by FDA under an Emergency Use Authorization (EUA). This EUA will remain  in effect (meaning this test can be used) for the duration of the COVID-19 declaration under Section 564(b)(1) of the Act, 21 U.S.C.section 360bbb-3(b)(1), unless the authorization is terminated  or revoked sooner.       Influenza A by PCR NEGATIVE NEGATIVE Final   Influenza B by PCR NEGATIVE NEGATIVE Final    Comment: (NOTE) The Xpert Xpress SARS-CoV-2/FLU/RSV plus assay is intended as an aid in the diagnosis of influenza from Nasopharyngeal swab specimens and should not be used as a sole basis for treatment. Nasal washings and aspirates are unacceptable for Xpert Xpress SARS-CoV-2/FLU/RSV testing.  Fact Sheet for Patients: BloggerCourse.comhttps://www.fda.gov/media/152166/download  Fact Sheet for Healthcare Providers: SeriousBroker.ithttps://www.fda.gov/media/152162/download  This test is not yet approved or cleared by the [place] FDA and has been authorized for detection and/or diagnosis of SARS-CoV-2 by FDA under an Emergency Use Authorization (EUA). This EUA will remain in effect (meaning this test can be used) for the duration of  the COVID-19 declaration under Section 564(b)(1) of the Act, 21 U.S.C. section 360bbb-3(b)(1), unless the authorization is terminated or revoked.  Performed at [facility], [address] [place], [place] [address]          Radiology Studies: DG Chest 2 View  Result Date: 08/27/2020 CLINICAL DATA:  Cough, shortness of breath. EXAM: CHEST - 2 VIEW COMPARISON:  August 19, 2020. FINDINGS: The heart size and mediastinal contours are within normal limits. Both lungs are clear. No pneumothorax or pleural effusion is noted. The visualized skeletal structures are unremarkable. IMPRESSION: No active cardiopulmonary disease. Electronically Signed   By: Lupita RaiderJames  Green Jr M.D.   On: 08/27/2020 11:59   CT Head Wo Contrast  Result Date: 08/27/2020 CLINICAL DATA:  Delirium. EXAM: CT HEAD WITHOUT CONTRAST TECHNIQUE: Contiguous axial images were obtained from the base of the skull through the vertex without intravenous contrast. COMPARISON:  November 01, 2013. 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 No mass effect or midline shift is noted. Ventricular size is within normal limits. There is no evidence of mass lesion, hemorrhage or acute infarction. Vascular: No hyperdense vessel or unexpected calcification. Skull: Normal. Negative for fracture or focal lesion. Sinuses/Orbits: No acute finding. Other: None. IMPRESSION: No acute intracranial abnormality seen. Electronically Signed   By: Lupita RaiderJames  Green Jr M.D.   On: 08/27/2020 12:52        Scheduled Meds: . amLODipine  10 mg Oral Daily  . aspirin  81 mg Oral Daily  . atorvastatin  20 mg Oral Daily  . enoxaparin (LOVENOX) injection  40 mg Subcutaneous Q24H  . folic acid  1 mg Oral Daily  . furosemide  60 mg Oral Daily  . ipratropium-albuterol  3 mL Nebulization Q6H  . levETIRAcetam  500 mg Oral BID  . lisinopril  5 mg Oral Daily  . methylPREDNISolone (SOLU-MEDROL) injection  80 mg Intravenous Daily   . pantoprazole  40 mg Oral Daily  . sodium zirconium cyclosilicate  10 g Oral Once  . thiamine  100 mg Oral Daily  . tiotropium  1 capsule Inhalation Daily   Continuous Infusions:  LOS: 0 days    Time spent: 33 mins     Darin Killian, MD [facility] Pager [phone]  If 7PM-7AM, please contact night-coverage 08/28/2020, 7:25 AM

## 2020-08-29 DIAGNOSIS — F1010 Alcohol abuse, uncomplicated: Secondary | ICD-10-CM

## 2020-08-29 DIAGNOSIS — N1831 Chronic kidney disease, stage 3a: Secondary | ICD-10-CM

## 2020-08-29 LAB — CBC
HCT: 36.2 % — ABNORMAL LOW (ref 39.0–52.0)
Hemoglobin: 11.2 g/dL — ABNORMAL LOW (ref 13.0–17.0)
MCH: 30.8 pg (ref 26.0–34.0)
MCHC: 30.9 g/dL (ref 30.0–36.0)
MCV: 99.5 fL (ref 80.0–100.0)
Platelets: 158 10*3/uL (ref 150–400)
RBC: 3.64 MIL/uL — ABNORMAL LOW (ref 4.22–5.81)
RDW: 13 % (ref 11.5–15.5)
WBC: 10.7 10*3/uL — ABNORMAL HIGH (ref 4.0–10.5)
nRBC: 0 % (ref 0.0–0.2)

## 2020-08-29 LAB — COMPREHENSIVE METABOLIC PANEL
ALT: 35 U/L (ref 0–44)
AST: 31 U/L (ref 15–41)
Albumin: 3.5 g/dL (ref 3.5–5.0)
Alkaline Phosphatase: 35 U/L — ABNORMAL LOW (ref 38–126)
Anion gap: 8 (ref 5–15)
BUN: 71 mg/dL — ABNORMAL HIGH (ref 6–20)
CO2: 34 mmol/L — ABNORMAL HIGH (ref 22–32)
Calcium: 9 mg/dL (ref 8.9–10.3)
Chloride: 95 mmol/L — ABNORMAL LOW (ref 98–111)
Creatinine, Ser: 1.74 mg/dL — ABNORMAL HIGH (ref 0.61–1.24)
GFR, Estimated: 45 mL/min — ABNORMAL LOW (ref >60)
Glucose, Bld: 88 mg/dL (ref 70–99)
Potassium: 5.1 mmol/L (ref 3.5–5.1)
Sodium: 137 mmol/L (ref 135–145)
Total Bilirubin: 0.8 mg/dL (ref 0.3–1.2)
Total Protein: 6.6 g/dL (ref 6.5–8.1)

## 2020-08-29 NOTE — Plan of Care (Signed)
  Problem: Education: Goal: Knowledge of General Education information will improve Description: Including pain rating scale, medication(s)/side effects and non-pharmacologic comfort measures Outcome: Progressing   Problem: Clinical Measurements: Goal: Will remain free from infection Outcome: Progressing   Problem: Activity: Goal: Risk for activity intolerance will decrease Outcome: Progressing   Problem: Nutrition: Goal: Adequate nutrition will be maintained Outcome: Progressing   Problem: Coping: Goal: Level of anxiety will decrease Outcome: Progressing   Problem: Pain Managment: Goal: General experience of comfort will improve Outcome: Progressing   Problem: Safety: Goal: Ability to remain free from injury will improve Outcome: Progressing   Problem: Skin Integrity: Goal: Risk for impaired skin integrity will decrease Outcome: Progressing   Problem: Education: Goal: Knowledge of disease or condition will improve Outcome: Progressing   Problem: Respiratory: Goal: Ability to maintain a clear airway will improve Outcome: Progressing

## 2020-08-29 NOTE — Evaluation (Signed)
Occupational Therapy Evaluation Patient Details Name: Darin Hopkins MRN: [id number] DOB: 12-21-1960 Today's Date: 08/29/2020    History of Present Illness Pt is a 60 y/o M admitted on 08/27/20 with c/c of SOB. Pt being treated for COPD exacerbation. PMH: HTN, GERD, heart failure exacerbation, siezures, CKD3, HLD, COPD, CVA, alcohol & tobacco abuse   Clinical Impression   Pt was seen for OT evaluation this date. Prior to hospital admission, pt was ambulating without AD and generally independent with basic ADL tasks, taking tub baths. He lives with his sister and brother in Social worker. His sister provides transportation and medication management assistance and his brother in law provides meals. Pt endorses several falls in past 28mo. Currently pt demonstrates impairments in strength, activity tolerance, and balance as described below (See OT problem list) which functionally limit his ability to perform ADL/self-care tasks. Pt currently requires CGA and PRN MIN A for LB ADL tasks. Denies SOB but does fatigue quickly. Pt instructed in falls prevention and energy conservation strategies including pursed lip breathing, activity pacing, work simplification, AE/DME, positioning, and scheduling of activities to maximize safety/indep with ADL. Pt also instructed incentive spirometer and flutter valve use to support lungs. Pt verbalized understanding and would benefit from skilled OT services to address noted impairments and functional limitations (see below for any additional details) in order to maximize safety and independence while minimizing falls risk and caregiver burden. Upon hospital discharge, recommend HHOT to maximize pt safety and return to functional independence during meaningful occupations of daily life.     Follow Up Recommendations  Home health OT;Supervision - Intermittent    Equipment Recommendations  3 in 1 bedside commode;Tub/shower bench    Recommendations for Other Services        Precautions / Restrictions Precautions Precautions: Fall Restrictions Weight Bearing Restrictions: No      Mobility Bed Mobility Overal bed mobility: Modified Independent Bed Mobility: Supine to Sit     Supine to sit: Modified independent (Device/Increase time);HOB elevated          Transfers Overall transfer level: Needs assistance Equipment used: Rolling walker (2 wheeled) Transfers: Sit to/from Stand Sit to Stand: Supervision              Balance Overall balance assessment: Needs assistance;History of Falls Sitting-balance support: No upper extremity supported;Feet supported Sitting balance-Leahy Scale: Good     Standing balance support: During functional activity;Bilateral upper extremity supported Standing balance-Leahy Scale: Fair                             ADL either performed or assessed with clinical judgement   ADL Overall ADL's : Needs assistance/impaired Eating/Feeding: Sitting;Independent   Grooming: Standing;Min Doctor, hospital: RW;Ambulation;Min guard           Functional mobility during ADLs: Min guard;Rolling walker General ADL Comments: Pt requires CGA for LB ADL tasks and increased time/effort to perform     Vision Patient Visual Report: No change from baseline       Perception     Praxis      Pertinent Vitals/Pain Pain Assessment: No/denies pain     Hand Dominance Right   Extremity/Trunk Assessment Upper Extremity Assessment Upper Extremity Assessment: Generalized weakness;LUE deficits/detail LUE Deficits / Details: hx CVA with residual L sided deficits including decreased strength and coordination, but functionally, able to  use on RW after mild difficulty to place hand on RW LUE Coordination: decreased fine motor;decreased gross motor   Lower Extremity Assessment Lower Extremity Assessment: Generalized weakness   Cervical / Trunk Assessment Cervical / Trunk Assessment:  Normal   Communication Communication Communication: No difficulties   Cognition Arousal/Alertness: Awake/alert Behavior During Therapy: WFL for tasks assessed/performed Overall Cognitive Status: Within Functional Limits for tasks assessed                                 General Comments: Follows commands with cues   General Comments       Exercises Other Exercises Other Exercises: Pt instructed in falls prevention and energy conservation strategies including pursed lip breathing, activity pacing, work simplification, AE/DME, positioning, and scheduling of activities to maximize safety/indep with ADL   Shoulder Instructions      Home Living Family/patient expects to be discharged to:: Private residence Living Arrangements: Other relatives Available Help at Discharge: Family;Available PRN/intermittently Type of Home: Mobile home Home Access: Stairs to enter Entrance Stairs-Number of Steps: 2 Entrance Stairs-Rails: Right;Left;Can reach both Home Layout: One level               Home Equipment: Walker - 2 wheels          Prior Functioning/Environment Level of Independence: Independent;Needs assistance  Gait / Transfers Assistance Needed: Reports he hasn't been using RW for mobility. ADL's / Homemaking Assistance Needed: Per chart and pt report, pt does not drive (sister drives), brother in law does the cooking in the home, 2-3 falls in the past 6 months, and sister manages medications; pt takes tub baths and able to dress himself            OT Problem List: Decreased strength;Decreased activity tolerance;Impaired balance (sitting and/or standing);Impaired vision/perception;Decreased safety awareness      OT Treatment/Interventions: Self-care/ADL training;Therapeutic exercise;Energy conservation;DME and/or AE instruction;Therapeutic activities;Patient/family education;Balance training    OT Goals(Current goals can be found in the care plan section)  Acute Rehab OT Goals Patient Stated Goal: get better OT Goal Formulation: With patient Time For Goal Achievement: 09/12/20 Potential to Achieve Goals: Good ADL Goals Pt Will Transfer to Toilet: with supervision;ambulating (LRAD for amb) Additional ADL Goal #1: Pt will verbalize plan to implement at least 2 learned falls prevention strategies during ADL tasks at home to minimize falls risk. Additional ADL Goal #2: Pt will verbalize plan to implement at least 2 learned energy conservation strategies during ADL tasks at home to minimize falls risk and SOB.  OT Frequency: Min 1X/week   Barriers to D/C:            Co-evaluation              AM-PAC OT "6 Clicks" Daily Activity     Outcome Measure Help from another person eating meals?: None Help from another person taking care of personal grooming?: None Help from another person toileting, which includes using toliet, bedpan, or urinal?: A Little Help from another person bathing (including washing, rinsing, drying)?: A Little Help from another person to put on and taking off regular upper body clothing?: None Help from another person to put on and taking off regular lower body clothing?: A Little 6 Click Score: 21   End of Session Equipment Utilized During Treatment: Gait belt;Oxygen;Rolling walker  Activity Tolerance: Patient tolerated treatment well Patient left: in chair;with call bell/phone within reach;with chair alarm set  OT  Visit Diagnosis: Muscle weakness (generalized) (M62.81);Repeated falls (R29.6)                Time: 3128-1188 OT Time Calculation (min): 23 min Charges:  OT General Charges $OT Visit: 1 Visit OT Evaluation $OT Eval Moderate Complexity: 1 Mod OT Treatments $Self Care/Home Management : 8-22 mins  Wynona Canes, MPH, MS, OTR/L ascom [phone] 08/29/20, 3:42 PM

## 2020-08-29 NOTE — Progress Notes (Signed)
OT Cancellation Note  Patient Details Name: Darin Hopkins MRN: [id number] DOB: 09-18-1960   Cancelled Treatment:    Reason Eval/Treat Not Completed: Other (comment). On attempt, pt with RN and leadership for rounding. Will re-attempt OT evaluation at later time as pt is available.  Wynona Canes, MPH, MS, OTR/L ascom [phone] 08/29/20, 10:58 AM

## 2020-08-29 NOTE — Evaluation (Signed)
Physical Therapy Evaluation Patient Details Name: Darin Hopkins MRN: [id number] DOB: 18-Jun-1960 Today's Date: 08/29/2020   History of Present Illness  Pt is a 60 y/o M admitted on 08/27/20 with c/c of SOB. Pt being treated for COPD exacerbation. PMH: HTN, GERD, heart failure exacerbation, siezures, CKD3, HLD, COPD, CVA, alcohol & tobacco abuse  Clinical Impression  Pt seen for PT evaluation with pt able to ambulate short distance in room without AD & CGA but 1 lap around nurses station with RW & supervision without overt LOB. Pt on 4L/min via nasal cannula with SpO2 88-94% during session; PT educates pt on need to use supplemental O2 upon return home. Will continue to follow pt to progress gait with LRAD & for high level balance training.     Follow Up Recommendations Home health PT;Supervision for mobility/OOB    Equipment Recommendations  None recommended by PT (pt already has RW)    Recommendations for Other Services       Precautions / Restrictions Precautions Precautions: Fall Restrictions Weight Bearing Restrictions: No      Mobility  Bed Mobility Overal bed mobility: Modified Independent Bed Mobility: Supine to Sit     Supine to sit: Modified independent (Device/Increase time);HOB elevated          Transfers Overall transfer level: Needs assistance Equipment used: Rolling walker (2 wheeled);None Transfers: Sit to/from [employer] Sit to Stand: Supervision (cuing for hand placement during sit>stand with RW) Stand pivot transfers: Min guard (stand pivot bed>recliner without AD & CGA)          Ambulation/Gait Ambulation/Gait assistance: Min guard Gait Distance (Feet): 35 Feet (+ 170) Assistive device: None;Rolling walker (2 wheeled)   Gait velocity: decreased   General Gait Details: Pt ambulates 35 ft in room without AD & CGA, then requests to use RW when ambulating full lap around nurses station.  Stairs            Wheelchair  Mobility    Modified Rankin (Stroke Patients Only)       Balance Overall balance assessment: Needs assistance;History of Falls Sitting-balance support: No upper extremity supported;Feet supported Sitting balance-Leahy Scale: Good Sitting balance - Comments: supervision sitting   Standing balance support: During functional activity;Bilateral upper extremity supported Standing balance-Leahy Scale: Fair                               Pertinent Vitals/Pain Pain Assessment: No/denies pain    Home Living Family/patient expects to be discharged to:: Private residence Living Arrangements: Other relatives Available Help at Discharge: Family;Available PRN/intermittently Type of Home: Mobile home Home Access: Stairs to enter Entrance Stairs-Rails: Right;Left;Can reach both Entrance Stairs-Number of Steps: 2 Home Layout: One level Home Equipment: Walker - 2 wheels      Prior Function Level of Independence: Independent         Comments: Reports he hasn't been using RW for mobility. Per chart, pt does not drive (sister drives), brother in law does the cooking in the home, 2-3 falls in the past 6 months     Hand Dominance   Dominant Hand: Right    Extremity/Trunk Assessment   Upper Extremity Assessment Upper Extremity Assessment:  (LUE weakness noted, pt with hx of CVA but still able to maintain hand hold on RW)    Lower Extremity Assessment Lower Extremity Assessment: Generalized weakness       Communication   Communication: No difficulties  Cognition  Arousal/Alertness: Awake/alert Behavior During Therapy: WFL for tasks assessed/performed Overall Cognitive Status: Within Functional Limits for tasks assessed                                        General Comments General comments (skin integrity, edema, etc.): Pt on 4L/min via nasal cannula throughout session, SpO2 88-94%, cuing for pursed lip breathing; reviewed use & frequency of use of  incentive spirometer as well as need to use supplemental O2 at home.    Exercises Other Exercises Other Exercises: 10x sit<>stand without BUE support with max cuing for technique & close supervision for BLE strengthening   Assessment/Plan    PT Assessment Patient needs continued PT services  PT Problem List Decreased strength;Decreased activity tolerance;Decreased balance;Decreased mobility;Decreased knowledge of use of DME;Decreased safety awareness;Cardiopulmonary status limiting activity       PT Treatment Interventions DME instruction;Gait training;Stair training;Functional mobility training;Therapeutic activities;Therapeutic exercise;Balance training;Neuromuscular re-education;Patient/family education    PT Goals (Current goals can be found in the Care Plan section)  Acute Rehab PT Goals Patient Stated Goal: get better PT Goal Formulation: With patient Time For Goal Achievement: 09/12/20 Potential to Achieve Goals: Fair    Frequency Min 2X/week   Barriers to discharge        Co-evaluation               AM-PAC PT "6 Clicks" Mobility  Outcome Measure Help needed turning from your back to your side while in a flat bed without using bedrails?: None Help needed moving from lying on your back to sitting on the side of a flat bed without using bedrails?: None Help needed moving to and from a bed to a chair (including a wheelchair)?: A Little Help needed standing up from a chair using your arms (e.g., wheelchair or bedside chair)?: None Help needed to walk in hospital room?: A Little Help needed climbing 3-5 steps with a railing? : A Little 6 Click Score: 21    End of Session Equipment Utilized During Treatment: Oxygen;Gait belt Activity Tolerance: Patient tolerated treatment well Patient left: in chair;with call bell/phone within reach;with chair alarm set Nurse Communication: Mobility status PT Visit Diagnosis: Unsteadiness on feet (R26.81);Muscle weakness  (generalized) (M62.81);History of falling (Z91.81)    Time: 8182-9937 PT Time Calculation (min) (ACUTE ONLY): 21 min   Charges:   PT Evaluation $PT Eval Low Complexity: 1 Low PT Treatments $Therapeutic Activity: 8-22 mins        Vibramycin   Sandi Mariscal 08/29/2020, 10:32 AM

## 2020-08-29 NOTE — Progress Notes (Signed)
PROGRESS NOTE    Darin Hopkins  UUV:[id number] DOB: Apr 17, 1961 DOA: 08/27/2020 PCP: Martie Round, NP  Assessment & Plan:   Principal Problem:   COPD exacerbation (HCC) Active Problems:   Essential hypertension   History of seizure   COPD (chronic obstructive pulmonary disease) (HCC)   Hyperlipidemia   GERD (gastroesophageal reflux disease)   Alcohol abuse   Acute respiratory failure with hypoxia (HCC)   Protein-calorie malnutrition, severe     Dyspnea: etiology unclear, possible COPD exacerbation. Continue on IV steroids, bronchodilators. Encourage incentive spirometry   Chronic hypoxic respiratory failure: continue on supplemental oxygen and wean back to baseline as tolerated. Uses 4L [place] at home but has been non-complaint w/ oxygen at home recently   HTN: continue CCB, ACE-I  HLD: continue on statin   Hx of seizures: continue on home dose of keppra  CKDIIIa: Cr continues to trend up daily. Will hold home dose of lasix today   Hyperkalemia: resolved  B/l LE edema: will hold home dose of lasix secondary to increasing Cr   GERD: continue on PPI   Alcohol abuse: continue on CIWA protocol. Continue on folic and thiamine. Alcohol cessation counseling   Transaminitis: resolved     DVT prophylaxis: lovenox Code Status: full  Family Communication:  Disposition Plan: d/c home w/ home health  Level of care: Med-Surg   Status is: Inpatient  Remains inpatient appropriate because:Ongoing diagnostic testing needed not appropriate for outpatient work up, Unsafe d/c plan and IV treatments appropriate due to intensity of illness or inability to take PO   Dispo: The patient is from: Home              Anticipated d/c is to: Home vs SNF               Patient currently is not medically stable to d/c.   Difficult to place patient Yes         Consultants:      Procedures:   Antimicrobials:   Subjective: Pt c/o fatigue   Objective: Vitals:    08/29/20 0057 08/29/20 0240 08/29/20 0241 08/29/20 0406  BP: 122/90   130/87  Pulse: (!) 55  (!) 52 (!) 56  Resp: 16  16 16   Temp: 97.6 F (36.4 C)   (!) 97.5 F (36.4 C)  TempSrc: Oral   Oral  SpO2: 99% 97% 97% 98%  Weight:      Height:        Intake/Output Summary (Last 24 hours) at 08/29/2020 0730 Last data filed at 08/29/2020 0533 Gross per 24 hour  Intake 480 ml  Output 800 ml  Net -320 ml   Filed Weights   08/27/20 1048  Weight: 61.2 kg    Examination:  General exam: Appears comfortable. Appears older than stated age  Respiratory system: decreased breath sounds b/l. Wheezes b/l Cardiovascular system: S1/S2+. No rubs or clicks  Gastrointestinal system: Abd is soft, NT, ND & hypoactive bowel sounds  Central nervous system: Alert and awake. Moves all extremities  Psychiatry: judgement and insight appear normal. Flat mood and affect     Data Reviewed: I have personally reviewed following labs and imaging studies  CBC: Recent Labs  Lab 08/27/20 1052 08/28/20 0519 08/29/20 0326  WBC 10.8* 8.5 10.7*  NEUTROABS 4.9  --   --   HGB 12.6* 12.1* 11.2*  HCT 41.4 38.2* 36.2*  MCV 102.2* 98.7 99.5  PLT 142* 162 158   Basic Metabolic Panel: Recent Labs  Lab  08/23/20 0416 08/23/20 1747 08/24/20 0437 08/25/20 0417 08/27/20 1052 08/27/20 1304 08/28/20 0519 08/29/20 0326  NA 139 139 140 139 138  --  139 137  K 5.0 5.0 5.1 5.1 5.5*  --  5.3* 5.1  CL 91* 91* 89* 89* 93*  --  93* 95*  CO2 40* 40* 41* 42* 31  --  35* 34*  GLUCOSE 97 125* 94 90 106*  --  86 88  BUN 63* 61* 61* 67* 56*  --  61* 71*  CREATININE 1.45* 1.55* 1.49* 1.58* 1.44*  --  1.45* 1.74*  CALCIUM 8.8* 8.6* 8.9 9.1 9.2  --  9.3 9.0  MG 2.0  --  2.1 2.2  --  2.1  --   --   PHOS 1.2* 3.5 3.1 3.2  --  4.6  --   --    GFR: Estimated Creatinine Clearance: 39.6 mL/min (A) (by C-G formula based on SCr of 1.74 mg/dL (H)). Liver Function Tests: Recent Labs  Lab 08/23/20 1747 08/24/20 0437  08/25/20 0417 08/27/20 1052 08/29/20 0326  AST  --   --   --  66* 31  ALT  --   --   --  47* 35  ALKPHOS  --   --   --  39 35*  BILITOT  --   --   --  0.9 0.8  PROT  --   --   --  7.4 6.6  ALBUMIN 3.3* 3.2* 3.3* 3.9 3.5   No results for input(s): LIPASE, AMYLASE in the last 168 hours. No results for input(s): AMMONIA in the last 168 hours. Coagulation Profile: No results for input(s): INR, PROTIME in the last 168 hours. Cardiac Enzymes: No results for input(s): CKTOTAL, CKMB, CKMBINDEX, TROPONINI in the last 168 hours. BNP (last 3 results) No results for input(s): PROBNP in the last 8760 hours. HbA1C: No results for input(s): HGBA1C in the last 72 hours. CBG: No results for input(s): GLUCAP in the last 168 hours. Lipid Profile: No results for input(s): CHOL, HDL, LDLCALC, TRIG, CHOLHDL, LDLDIRECT in the last 72 hours. Thyroid Function Tests: No results for input(s): TSH, T4TOTAL, FREET4, T3FREE, THYROIDAB in the last 72 hours. Anemia Panel: No results for input(s): VITAMINB12, FOLATE, FERRITIN, TIBC, IRON, RETICCTPCT in the last 72 hours. Sepsis Labs: Recent Labs  Lab 08/27/20 1144 08/27/20 1304  PROCALCITON  --  <0.10  LATICACIDVEN 2.4* 1.5    Recent Results (from the past 240 hour(s))  Resp Panel by RT-PCR (Flu A&B, Covid) Nasopharyngeal Swab     Status: None   Collection Time: 08/27/20  2:10 PM   Specimen: Nasopharyngeal Swab; Nasopharyngeal(NP) swabs in vial transport medium  Result Value Ref Range Status   SARS Coronavirus 2 by RT PCR NEGATIVE NEGATIVE Final    Comment: (NOTE) SARS-CoV-2 target nucleic acids are NOT DETECTED.  The SARS-CoV-2 RNA is generally detectable in upper respiratory specimens during the acute phase of infection. The lowest concentration of SARS-CoV-2 viral copies this assay can detect is 138 copies/mL. A negative result does not preclude SARS-Cov-2 infection and should not be used as the sole basis for treatment or other patient  management decisions. A negative result may occur with  improper specimen collection/handling, submission of specimen other than nasopharyngeal swab, presence of viral mutation(s) within the areas targeted by this assay, and inadequate number of viral copies(<138 copies/mL). A negative result must be combined with clinical observations, patient history, and epidemiological information. The expected result is Negative.  Fact Sheet  for Patients:  BloggerCourse.com  Fact Sheet for Healthcare Providers:  SeriousBroker.it  This test is no t yet approved or cleared by the [place] FDA and  has been authorized for detection and/or diagnosis of SARS-CoV-2 by FDA under an Emergency Use Authorization (EUA). This EUA will remain  in effect (meaning this test can be used) for the duration of the COVID-19 declaration under Section 564(b)(1) of the Act, 21 U.S.C.section 360bbb-3(b)(1), unless the authorization is terminated  or revoked sooner.       Influenza A by PCR NEGATIVE NEGATIVE Final   Influenza B by PCR NEGATIVE NEGATIVE Final    Comment: (NOTE) The Xpert Xpress SARS-CoV-2/FLU/RSV plus assay is intended as an aid in the diagnosis of influenza from Nasopharyngeal swab specimens and should not be used as a sole basis for treatment. Nasal washings and aspirates are unacceptable for Xpert Xpress SARS-CoV-2/FLU/RSV testing.  Fact Sheet for Patients: BloggerCourse.com  Fact Sheet for Healthcare Providers: SeriousBroker.it  This test is not yet approved or cleared by the [place] FDA and has been authorized for detection and/or diagnosis of SARS-CoV-2 by FDA under an Emergency Use Authorization (EUA). This EUA will remain in effect (meaning this test can be used) for the duration of the COVID-19 declaration under Section 564(b)(1) of the Act, 21 U.S.C. section 360bbb-3(b)(1),  unless the authorization is terminated or revoked.  Performed at [facility], [address] [place], [place] [address]          Radiology Studies: DG Chest 2 View  Result Date: 08/27/2020 CLINICAL DATA:  Cough, shortness of breath. EXAM: CHEST - 2 VIEW COMPARISON:  August 19, 2020. FINDINGS: The heart size and mediastinal contours are within normal limits. Both lungs are clear. No pneumothorax or pleural effusion is noted. The visualized skeletal structures are unremarkable. IMPRESSION: No active cardiopulmonary disease. Electronically Signed   By: Lupita Raider M.D.   On: 08/27/2020 11:59   CT Head Wo Contrast  Result Date: 08/27/2020 CLINICAL DATA:  Delirium. EXAM: CT HEAD WITHOUT CONTRAST TECHNIQUE: Contiguous axial images were obtained from the base of the skull through the vertex without intravenous contrast. COMPARISON:  November 01, 2013. 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 No mass effect or midline shift is noted. Ventricular size is within normal limits. There is no evidence of mass lesion, hemorrhage or acute infarction. Vascular: No hyperdense vessel or unexpected calcification. Skull: Normal. Negative for fracture or focal lesion. Sinuses/Orbits: No acute finding. Other: None. IMPRESSION: No acute intracranial abnormality seen. Electronically Signed   By: Lupita Raider M.D.   On: 08/27/2020 12:52        Scheduled Meds: . amLODipine  10 mg Oral Daily  . aspirin  81 mg Oral Daily  . atorvastatin  20 mg Oral Daily  . enoxaparin (LOVENOX) injection  40 mg Subcutaneous Q24H  . folic acid  1 mg Oral Daily  . ipratropium-albuterol  3 mL Nebulization Q6H  . levETIRAcetam  500 mg Oral BID  . lisinopril  5 mg Oral Daily  . methylPREDNISolone (SOLU-MEDROL) injection  80 mg Intravenous Daily  . pantoprazole  40 mg Oral Daily  . thiamine  100 mg Oral Daily  . tiotropium  1 capsule Inhalation Daily   Continuous  Infusions:   LOS: 1 day    Time spent: 30 mins     Charise Killian, MD [facility] Pager [phone]  If 7PM-7AM, please contact night-coverage 08/29/2020, 7:30 AM

## 2020-08-29 NOTE — Progress Notes (Signed)
Met with the patient in the room, he was set up with Oxygen that he now has at home at a previous visit, He was set up with a RW and a 3 in 1, and with [place] thru [facility], he will continue using these services.  Educated on taking the Incentive spirometer home and using it daily.  He stated understanding, he has transportation and can afford his medicaitons

## 2020-08-30 LAB — COMPREHENSIVE METABOLIC PANEL
ALT: 28 U/L (ref 0–44)
AST: 22 U/L (ref 15–41)
Albumin: 3.3 g/dL — ABNORMAL LOW (ref 3.5–5.0)
Alkaline Phosphatase: 34 U/L — ABNORMAL LOW (ref 38–126)
Anion gap: 9 (ref 5–15)
BUN: 61 mg/dL — ABNORMAL HIGH (ref 6–20)
CO2: 34 mmol/L — ABNORMAL HIGH (ref 22–32)
Calcium: 9 mg/dL (ref 8.9–10.3)
Chloride: 98 mmol/L (ref 98–111)
Creatinine, Ser: 1.57 mg/dL — ABNORMAL HIGH (ref 0.61–1.24)
GFR, Estimated: 50 mL/min — ABNORMAL LOW (ref >60)
Glucose, Bld: 92 mg/dL (ref 70–99)
Potassium: 4.5 mmol/L (ref 3.5–5.1)
Sodium: 141 mmol/L (ref 135–145)
Total Bilirubin: 0.7 mg/dL (ref 0.3–1.2)
Total Protein: 6.5 g/dL (ref 6.5–8.1)

## 2020-08-30 LAB — CBC
HCT: 37.4 % — ABNORMAL LOW (ref 39.0–52.0)
Hemoglobin: 11.8 g/dL — ABNORMAL LOW (ref 13.0–17.0)
MCH: 31.3 pg (ref 26.0–34.0)
MCHC: 31.6 g/dL (ref 30.0–36.0)
MCV: 99.2 fL (ref 80.0–100.0)
Platelets: 168 10*3/uL (ref 150–400)
RBC: 3.77 MIL/uL — ABNORMAL LOW (ref 4.22–5.81)
RDW: 13.1 % (ref 11.5–15.5)
WBC: 10.2 10*3/uL (ref 4.0–10.5)
nRBC: 0 % (ref 0.0–0.2)

## 2020-08-30 MED ORDER — IPRATROPIUM-ALBUTEROL 0.5-2.5 (3) MG/3ML IN SOLN
3.0000 mL | Freq: Two times a day (BID) | RESPIRATORY_TRACT | Status: DC
  Administered 2020-08-30: 3 mL via RESPIRATORY_TRACT
  Filled 2020-08-30: qty 3

## 2020-08-30 MED ORDER — CARVEDILOL PHOSPHATE ER 20 MG PO CP24
20.0000 mg | ORAL_CAPSULE | Freq: Every day | ORAL | Status: DC
  Administered 2020-08-30 – 2020-09-01 (×3): 20 mg via ORAL
  Filled 2020-08-30 (×2): qty 1

## 2020-08-30 NOTE — Progress Notes (Signed)
PROGRESS NOTE    Darin Hopkins  NLZ:[id number] DOB: Jan 25, 1961 DOA: 08/27/2020 PCP: Martie Round, NP  Assessment & Plan:   Principal Problem:   COPD exacerbation (HCC) Active Problems:   Essential hypertension   History of seizure   COPD (chronic obstructive pulmonary disease) (HCC)   Hyperlipidemia   GERD (gastroesophageal reflux disease)   Alcohol abuse   Acute respiratory failure with hypoxia (HCC)   Protein-calorie malnutrition, severe     Dyspnea: etiology unclear, possible COPD exacerbation. Continue on IV steroids, bronchodilators. Encourage incentive spirometry   Chronic hypoxic respiratory failure: continue on supplemental oxygen, back to baseline. Uses 4L [place] at home but has been non-complaint w/ oxygen at home recently. Discussed the importance of wearing oxygen 24-7 and pt verbalized his understanding   HTN: continue on home dose lisinopril, coreg. 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 Will hold home dose of lasix today   Hyperkalemia: resolved  B/l LE edema: etiology unclear. Will continue to hold lasix, amlodipine   GERD: continue on PPI   Alcohol abuse: continue on folic, thiamine. Alcohol cessation counseling   Transaminitis: resolved     DVT prophylaxis: lovenox Code Status: full  Family Communication:  Disposition Plan: d/c home w/ home health. Pt's sister is refusing to take the pt home stating she can no longer take care of him   Level of care: Med-Surg   Status is: Inpatient  Remains inpatient appropriate because:Ongoing diagnostic testing needed not appropriate for outpatient work up, Unsafe d/c plan and IV treatments appropriate due to intensity of illness or inability to take PO. Pt's sister is refusing to take the pt home stating she can no longer take care of him     Dispo: The patient is from:  Home              Anticipated d/c is to: Home              Patient currently: pt is medically stable for d/c   Difficult to place patient Yes         Consultants:      Procedures:   Antimicrobials:   Subjective: Pt c/o malaise   Objective: Vitals:   08/29/20 2355 08/30/20 0403 08/30/20 0743 08/30/20 0801  BP: 118/85 128/75 130/79   Pulse: (!) 57 65 (!) 57   Resp: 17 17 20    Temp: 97.7 F (36.5 C) 97.7 F (36.5 C) 98.1 F (36.7 C)   TempSrc:      SpO2: 98% 100% 91% 90%  Weight:      Height:        Intake/Output Summary (Last 24 hours) at 08/30/2020 1255 Last data filed at 08/30/2020 1054 Gross per 24 hour  Intake 240 ml  Output 800 ml  Net -560 ml   Filed Weights   08/27/20 1048  Weight: 61.2 kg    Examination:  General exam: Appears calm & comfortable. Appears older than stated age  Respiratory system: diminished breath sounds b/l  Cardiovascular system: S1 & S2+. No rubs or clicks  Gastrointestinal system: Abd is soft, NT,ND & normal bowel sounds  Central nervous system: Alert and awake. Moves all extremities  Psychiatry: judgement and insight appear normal. Flat mood and affect    Data Reviewed: I have personally reviewed following labs and imaging studies  CBC: Recent  Labs  Lab 08/27/20 1052 08/28/20 0519 08/29/20 0326 08/30/20 0504  WBC 10.8* 8.5 10.7* 10.2  NEUTROABS 4.9  --   --   --   HGB 12.6* 12.1* 11.2* 11.8*  HCT 41.4 38.2* 36.2* 37.4*  MCV 102.2* 98.7 99.5 99.2  PLT 142* 162 158 168   Basic Metabolic Panel: Recent Labs  Lab 08/23/20 1747 08/24/20 0437 08/25/20 0417 08/27/20 1052 08/27/20 1304 08/28/20 0519 08/29/20 0326 08/30/20 0504  NA 139 140 139 138  --  139 137 141  K 5.0 5.1 5.1 5.5*  --  5.3* 5.1 4.5  CL 91* 89* 89* 93*  --  93* 95* 98  CO2 40* 41* 42* 31  --  35* 34* 34*  GLUCOSE 125* 94 90 106*  --  86 88 92  BUN 61* 61* 67* 56*  --  61* 71* 61*  CREATININE 1.55* 1.49* 1.58* 1.44*  --  1.45* 1.74* 1.57*   CALCIUM 8.6* 8.9 9.1 9.2  --  9.3 9.0 9.0  MG  --  2.1 2.2  --  2.1  --   --   --   PHOS 3.5 3.1 3.2  --  4.6  --   --   --    GFR: Estimated Creatinine Clearance: 43.9 mL/min (A) (by C-G formula based on SCr of 1.57 mg/dL (H)). Liver Function Tests: Recent Labs  Lab 08/24/20 0437 08/25/20 0417 08/27/20 1052 08/29/20 0326 08/30/20 0504  AST  --   --  66* 31 22  ALT  --   --  47* 35 28  ALKPHOS  --   --  39 35* 34*  BILITOT  --   --  0.9 0.8 0.7  PROT  --   --  7.4 6.6 6.5  ALBUMIN 3.2* 3.3* 3.9 3.5 3.3*   No results for input(s): LIPASE, AMYLASE in the last 168 hours. No results for input(s): AMMONIA in the last 168 hours. Coagulation Profile: No results for input(s): INR, PROTIME in the last 168 hours. Cardiac Enzymes: No results for input(s): CKTOTAL, CKMB, CKMBINDEX, TROPONINI in the last 168 hours. BNP (last 3 results) No results for input(s): PROBNP in the last 8760 hours. HbA1C: No results for input(s): HGBA1C in the last 72 hours. CBG: No results for input(s): GLUCAP in the last 168 hours. Lipid Profile: No results for input(s): CHOL, HDL, LDLCALC, TRIG, CHOLHDL, LDLDIRECT in the last 72 hours. Thyroid Function Tests: No results for input(s): TSH, T4TOTAL, FREET4, T3FREE, THYROIDAB in the last 72 hours. Anemia Panel: No results for input(s): VITAMINB12, FOLATE, FERRITIN, TIBC, IRON, RETICCTPCT in the last 72 hours. Sepsis Labs: Recent Labs  Lab 08/27/20 1144 08/27/20 1304  PROCALCITON  --  <0.10  LATICACIDVEN 2.4* 1.5    Recent Results (from the past 240 hour(s))  Resp Panel by RT-PCR (Flu A&B, Covid) Nasopharyngeal Swab     Status: None   Collection Time: 08/27/20  2:10 PM   Specimen: Nasopharyngeal Swab; Nasopharyngeal(NP) swabs in vial transport medium  Result Value Ref Range Status   SARS Coronavirus 2 by RT PCR NEGATIVE NEGATIVE Final    Comment: (NOTE) SARS-CoV-2 target nucleic acids are NOT DETECTED.  The SARS-CoV-2 RNA is generally detectable  in upper respiratory specimens during the acute phase of infection. The lowest concentration of SARS-CoV-2 viral copies this assay can detect is 138 copies/mL. A negative result does not preclude SARS-Cov-2 infection and should not be used as the sole basis for treatment or other patient management decisions. A  negative result may occur with  improper specimen collection/handling, submission of specimen other than nasopharyngeal swab, presence of viral mutation(s) within the areas targeted by this assay, and inadequate number of viral copies(<138 copies/mL). A negative result must be combined with clinical observations, patient history, and epidemiological information. The expected result is Negative.  Fact Sheet for Patients:  BloggerCourse.com  Fact Sheet for Healthcare Providers:  SeriousBroker.it  This test is no t yet approved or cleared by the [place] FDA and  has been authorized for detection and/or diagnosis of SARS-CoV-2 by FDA under an Emergency Use Authorization (EUA). This EUA will remain  in effect (meaning this test can be used) for the duration of the COVID-19 declaration under Section 564(b)(1) of the Act, 21 U.S.C.section 360bbb-3(b)(1), unless the authorization is terminated  or revoked sooner.       Influenza A by PCR NEGATIVE NEGATIVE Final   Influenza B by PCR NEGATIVE NEGATIVE Final    Comment: (NOTE) The Xpert Xpress SARS-CoV-2/FLU/RSV plus assay is intended as an aid in the diagnosis of influenza from Nasopharyngeal swab specimens and should not be used as a sole basis for treatment. Nasal washings and aspirates are unacceptable for Xpert Xpress SARS-CoV-2/FLU/RSV testing.  Fact Sheet for Patients: BloggerCourse.com  Fact Sheet for Healthcare Providers: SeriousBroker.it  This test is not yet approved or cleared by the [place] FDA and has  been authorized for detection and/or diagnosis of SARS-CoV-2 by FDA under an Emergency Use Authorization (EUA). This EUA will remain in effect (meaning this test can be used) for the duration of the COVID-19 declaration under Section 564(b)(1) of the Act, 21 U.S.C. section 360bbb-3(b)(1), unless the authorization is terminated or revoked.  Performed at [facility], [address], [place], [place] [address]          Radiology Studies: No results found.      Scheduled Meds: . aspirin  81 mg Oral Daily  . atorvastatin  20 mg Oral Daily  . enoxaparin (LOVENOX) injection  40 mg Subcutaneous Q24H  . folic acid  1 mg Oral Daily  . ipratropium-albuterol  3 mL Nebulization BID  . levETIRAcetam  500 mg Oral BID  . lisinopril  5 mg Oral Daily  . methylPREDNISolone (SOLU-MEDROL) injection  80 mg Intravenous Daily  . pantoprazole  40 mg Oral Daily  . thiamine  100 mg Oral Daily  . tiotropium  1 capsule Inhalation Daily   Continuous Infusions:   LOS: 2 days    Time spent: 28 mins     Charise Killian, MD [facility] Pager [phone]  If 7PM-7AM, please contact night-coverage 08/30/2020, 12:55 PM

## 2020-08-31 LAB — CBC
HCT: 37.9 % — ABNORMAL LOW (ref 39.0–52.0)
Hemoglobin: 12 g/dL — ABNORMAL LOW (ref 13.0–17.0)
MCH: 31.1 pg (ref 26.0–34.0)
MCHC: 31.7 g/dL (ref 30.0–36.0)
MCV: 98.2 fL (ref 80.0–100.0)
Platelets: 175 10*3/uL (ref 150–400)
RBC: 3.86 MIL/uL — ABNORMAL LOW (ref 4.22–5.81)
RDW: 13.2 % (ref 11.5–15.5)
WBC: 10.6 10*3/uL — ABNORMAL HIGH (ref 4.0–10.5)
nRBC: 0 % (ref 0.0–0.2)

## 2020-08-31 LAB — COMPREHENSIVE METABOLIC PANEL
ALT: 25 U/L (ref 0–44)
AST: 19 U/L (ref 15–41)
Albumin: 3.3 g/dL — ABNORMAL LOW (ref 3.5–5.0)
Alkaline Phosphatase: 32 U/L — ABNORMAL LOW (ref 38–126)
Anion gap: 9 (ref 5–15)
BUN: 58 mg/dL — ABNORMAL HIGH (ref 6–20)
CO2: 34 mmol/L — ABNORMAL HIGH (ref 22–32)
Calcium: 9.1 mg/dL (ref 8.9–10.3)
Chloride: 100 mmol/L (ref 98–111)
Creatinine, Ser: 1.42 mg/dL — ABNORMAL HIGH (ref 0.61–1.24)
GFR, Estimated: 57 mL/min — ABNORMAL LOW (ref >60)
Glucose, Bld: 83 mg/dL (ref 70–99)
Potassium: 4.5 mmol/L (ref 3.5–5.1)
Sodium: 143 mmol/L (ref 135–145)
Total Bilirubin: 0.7 mg/dL (ref 0.3–1.2)
Total Protein: 6.6 g/dL (ref 6.5–8.1)

## 2020-08-31 MED ORDER — IPRATROPIUM-ALBUTEROL 0.5-2.5 (3) MG/3ML IN SOLN: 3.0000 mL | Freq: Four times a day (QID) | RESPIRATORY_TRACT | Status: DC | PRN

## 2020-08-31 MED ORDER — METHYLPREDNISOLONE SODIUM SUCC 125 MG IJ SOLR
60.0000 mg | Freq: Every day | INTRAMUSCULAR | Status: DC
  Administered 2020-09-01 – 2020-09-02 (×2): 60 mg via INTRAVENOUS
  Filled 2020-08-31 (×2): qty 2

## 2020-08-31 NOTE — Plan of Care (Signed)
  Problem: Activity: Goal: Risk for activity intolerance will decrease Outcome: Progressing   Problem: Nutrition: Goal: Adequate nutrition will be maintained Outcome: Progressing   Problem: Coping: Goal: Level of anxiety will decrease Outcome: Progressing   Problem: Safety: Goal: Ability to remain free from injury will improve Outcome: Progressing   Problem: Respiratory: Goal: Ability to maintain a clear airway will improve Outcome: Progressing

## 2020-08-31 NOTE — Progress Notes (Signed)
PROGRESS NOTE    Darin Hopkins  ZOX:[id number] DOB: [birth year] DOA: 08/27/2020 PCP: Martie Round, NP  Assessment & Plan:   Principal Problem:   COPD exacerbation (HCC) Active Problems:   Essential hypertension   History of seizure   COPD (chronic obstructive pulmonary disease) (HCC)   Hyperlipidemia   GERD (gastroesophageal reflux disease)   Alcohol abuse   Acute respiratory failure with hypoxia (HCC)   Protein-calorie malnutrition, severe     Dyspnea: etiology unclear, possible COPD exacerbation. Start IV steroid taper, continue on bronchodilators and encourage incentive spirometry   Chronic hypoxic respiratory failure: continue on supplemental oxygen, back to baseline. Uses 4L [place] at home but has been non-complaint w/ oxygen at home recently. Discussed the importance of wearing oxygen 24-7 and pt verbalized his understanding   HTN: continue on home dose of lisinopril, coreg. Continue to hold amlodipine for possible ADR of b/l LE edema   HLD: continue on statin    Hx of seizures: continue on home dose of keppra  CKDIIIa: Cr is trending down from day prior. Continue to hold home dose of lasix   Hyperkalemia: resolved  B/l LE edema: etiology unclear. Continue to hold lasix, amlodipine   GERD: continue on PPI    Alcohol abuse: continue on thiamine, folic acid. Alcohol cessation counseling   Transaminitis: resolved     DVT prophylaxis: lovenox Code Status: full  Family Communication:  Disposition Plan: d/c home w/ home health. Pt's sister is refusing to take the pt home stating she can no longer take care of him   Level of care: Med-Surg   Status is: Inpatient  Remains inpatient appropriate because:Ongoing diagnostic testing needed not appropriate for outpatient work up, Unsafe d/c plan and IV treatments appropriate due to intensity of illness or inability to take PO. Pt's sister is refusing to take the pt home stating she can no longer take care of him      Dispo: The patient is from: Home              Anticipated d/c is to: Home              Patient currently: pt is medically stable for d/c   Difficult to place patient Yes    Consultants:      Procedures:   Antimicrobials:   Subjective: Pt c/o fatigue    Objective: Vitals:   08/30/20 1602 08/30/20 1958 08/30/20 2037 08/31/20 0526  BP: 106/75 (!) 118/91  (!) 145/88  Pulse: 64 64  (!) 52  Resp: 20 17  18   Temp: 98.8 F (37.1 C) 98 F (36.7 C)  (!) 97.2 F (36.2 C)  TempSrc:      SpO2: 95% 98% 97% 99%  Weight:      Height:        Intake/Output Summary (Last 24 hours) at 08/31/2020 0721 Last data filed at 08/30/2020 1932 Gross per 24 hour  Intake 600 ml  Output 50 ml  Net 550 ml   Filed Weights   08/27/20 1048  Weight: 61.2 kg    Examination:  General exam: Appears comfortable. Appears older than stated age  Respiratory system: decreased breath sounds b/l  Cardiovascular system: S1/S2+. No rubs or clicks   Gastrointestinal system: Abd is soft, NT, ND & normal bowel sounds  Central nervous system: Alert and awake. Moves all extremities  Psychiatry: judgement and insight appear normal. Flat mood and affect     Data Reviewed: I have personally reviewed following labs  and imaging studies  CBC: Recent Labs  Lab 08/27/20 1052 08/28/20 0519 08/29/20 0326 08/30/20 0504 08/31/20 0548  WBC 10.8* 8.5 10.7* 10.2 10.6*  NEUTROABS 4.9  --   --   --   --   HGB 12.6* 12.1* 11.2* 11.8* 12.0*  HCT 41.4 38.2* 36.2* 37.4* 37.9*  MCV 102.2* 98.7 99.5 99.2 98.2  PLT 142* 162 158 168 175   Basic Metabolic Panel: Recent Labs  Lab 08/25/20 0417 08/27/20 1052 08/27/20 1304 08/28/20 0519 08/29/20 0326 08/30/20 0504 08/31/20 0548  NA 139 138  --  139 137 141 143  K 5.1 5.5*  --  5.3* 5.1 4.5 4.5  CL 89* 93*  --  93* 95* 98 100  CO2 42* 31  --  35* 34* 34* 34*  GLUCOSE 90 106*  --  86 88 92 83  BUN 67* 56*  --  61* 71* 61* 58*  CREATININE 1.58* 1.44*  --   1.45* 1.74* 1.57* 1.42*  CALCIUM 9.1 9.2  --  9.3 9.0 9.0 9.1  MG 2.2  --  2.1  --   --   --   --   PHOS 3.2  --  4.6  --   --   --   --    GFR: Estimated Creatinine Clearance: 48.5 mL/min (A) (by C-G formula based on SCr of 1.42 mg/dL (H)). Liver Function Tests: Recent Labs  Lab 08/25/20 0417 08/27/20 1052 08/29/20 0326 08/30/20 0504 08/31/20 0548  AST  --  66* 31 22 19   ALT  --  47* 35 28 25  ALKPHOS  --  39 35* 34* 32*  BILITOT  --  0.9 0.8 0.7 0.7  PROT  --  7.4 6.6 6.5 6.6  ALBUMIN 3.3* 3.9 3.5 3.3* 3.3*   No results for input(s): LIPASE, AMYLASE in the last 168 hours. No results for input(s): AMMONIA in the last 168 hours. Coagulation Profile: No results for input(s): INR, PROTIME in the last 168 hours. Cardiac Enzymes: No results for input(s): CKTOTAL, CKMB, CKMBINDEX, TROPONINI in the last 168 hours. BNP (last 3 results) No results for input(s): PROBNP in the last 8760 hours. HbA1C: No results for input(s): HGBA1C in the last 72 hours. CBG: No results for input(s): GLUCAP in the last 168 hours. Lipid Profile: No results for input(s): CHOL, HDL, LDLCALC, TRIG, CHOLHDL, LDLDIRECT in the last 72 hours. Thyroid Function Tests: No results for input(s): TSH, T4TOTAL, FREET4, T3FREE, THYROIDAB in the last 72 hours. Anemia Panel: No results for input(s): VITAMINB12, FOLATE, FERRITIN, TIBC, IRON, RETICCTPCT in the last 72 hours. Sepsis Labs: Recent Labs  Lab 08/27/20 1144 08/27/20 1304  PROCALCITON  --  <0.10  LATICACIDVEN 2.4* 1.5    Recent Results (from the past 240 hour(s))  Resp Panel by RT-PCR (Flu A&B, Covid) Nasopharyngeal Swab     Status: None   Collection Time: 08/27/20  2:10 PM   Specimen: Nasopharyngeal Swab; Nasopharyngeal(NP) swabs in vial transport medium  Result Value Ref Range Status   SARS Coronavirus 2 by RT PCR NEGATIVE NEGATIVE Final    Comment: (NOTE) SARS-CoV-2 target nucleic acids are NOT DETECTED.  The SARS-CoV-2 RNA is generally  detectable in upper respiratory specimens during the acute phase of infection. The lowest concentration of SARS-CoV-2 viral copies this assay can detect is 138 copies/mL. A negative result does not preclude SARS-Cov-2 infection and should not be used as the sole basis for treatment or other patient management decisions. A negative result may occur  with  improper specimen collection/handling, submission of specimen other than nasopharyngeal swab, presence of viral mutation(s) within the areas targeted by this assay, and inadequate number of viral copies(<138 copies/mL). A negative result must be combined with clinical observations, patient history, and epidemiological information. The expected result is Negative.  Fact Sheet for Patients:  BloggerCourse.com  Fact Sheet for Healthcare Providers:  SeriousBroker.it  This test is no t yet approved or cleared by the [place] FDA and  has been authorized for detection and/or diagnosis of SARS-CoV-2 by FDA under an Emergency Use Authorization (EUA). This EUA will remain  in effect (meaning this test can be used) for the duration of the COVID-19 declaration under Section 564(b)(1) of the Act, 21 U.S.C.section 360bbb-3(b)(1), unless the authorization is terminated  or revoked sooner.       Influenza A by PCR NEGATIVE NEGATIVE Final   Influenza B by PCR NEGATIVE NEGATIVE Final    Comment: (NOTE) The Xpert Xpress SARS-CoV-2/FLU/RSV plus assay is intended as an aid in the diagnosis of influenza from Nasopharyngeal swab specimens and should not be used as a sole basis for treatment. Nasal washings and aspirates are unacceptable for Xpert Xpress SARS-CoV-2/FLU/RSV testing.  Fact Sheet for Patients: BloggerCourse.com  Fact Sheet for Healthcare Providers: SeriousBroker.it  This test is not yet approved or cleared by the [place] FDA  and has been authorized for detection and/or diagnosis of SARS-CoV-2 by FDA under an Emergency Use Authorization (EUA). This EUA will remain in effect (meaning this test can be used) for the duration of the COVID-19 declaration under Section 564(b)(1) of the Act, 21 U.S.C. section 360bbb-3(b)(1), unless the authorization is terminated or revoked.  Performed at [facility], [address], [place], [place] [address]          Radiology Studies: No results found.      Scheduled Meds: . aspirin  81 mg Oral Daily  . atorvastatin  20 mg Oral Daily  . carvedilol  20 mg Oral Daily  . enoxaparin (LOVENOX) injection  40 mg Subcutaneous Q24H  . folic acid  1 mg Oral Daily  . ipratropium-albuterol  3 mL Nebulization BID  . levETIRAcetam  500 mg Oral BID  . lisinopril  5 mg Oral Daily  . methylPREDNISolone (SOLU-MEDROL) injection  80 mg Intravenous Daily  . pantoprazole  40 mg Oral Daily  . thiamine  100 mg Oral Daily  . tiotropium  1 capsule Inhalation Daily   Continuous Infusions:   LOS: 3 days    Time spent: 30 mins     Charise Killian, MD [facility] Pager [phone]  If 7PM-7AM, please contact night-coverage 08/31/2020, 7:21 AM

## 2020-08-31 NOTE — Plan of Care (Signed)

## 2020-09-01 LAB — BASIC METABOLIC PANEL
Anion gap: 5 (ref 5–15)
BUN: 66 mg/dL — ABNORMAL HIGH (ref 6–20)
CO2: 34 mmol/L — ABNORMAL HIGH (ref 22–32)
Calcium: 8.6 mg/dL — ABNORMAL LOW (ref 8.9–10.3)
Chloride: 101 mmol/L (ref 98–111)
Creatinine, Ser: 1.77 mg/dL — ABNORMAL HIGH (ref 0.61–1.24)
GFR, Estimated: 44 mL/min — ABNORMAL LOW (ref >60)
Glucose, Bld: 99 mg/dL (ref 70–99)
Potassium: 4.4 mmol/L (ref 3.5–5.1)
Sodium: 140 mmol/L (ref 135–145)

## 2020-09-01 LAB — CBC
HCT: 38.9 % — ABNORMAL LOW (ref 39.0–52.0)
Hemoglobin: 12 g/dL — ABNORMAL LOW (ref 13.0–17.0)
MCH: 31.3 pg (ref 26.0–34.0)
MCHC: 30.8 g/dL (ref 30.0–36.0)
MCV: 101.3 fL — ABNORMAL HIGH (ref 80.0–100.0)
Platelets: 169 10*3/uL (ref 150–400)
RBC: 3.84 MIL/uL — ABNORMAL LOW (ref 4.22–5.81)
RDW: 13 % (ref 11.5–15.5)
WBC: 10 10*3/uL (ref 4.0–10.5)
nRBC: 0 % (ref 0.0–0.2)

## 2020-09-01 NOTE — TOC Progression Note (Signed)
Transition of Care [facility]) - Progression Note    Patient Details  Name: Darin Hopkins MRN: [id number] Date of Birth: 1960/10/10  [facility] CM/SW Contact  Barrie Dunker, RN Phone Number: 09/01/2020, 1:18 PM  Clinical Narrative:   [name] Monday with [name] the patient's sister, she confirms that he has RW, 3 in 1 and oxygen at home, She stated that she is having hard time taking care of him but will do it one more time, she will let me know if he needs transportation    Expected Discharge Plan: Home w Home Health Services Barriers to Discharge: Barriers Resolved  Expected Discharge Plan and Services Expected Discharge Plan: Home w Home Health Services   Discharge Planning Services: CM Consult                     DME Arranged: N/A         HH Arranged: NA           Social Determinants of Health (SDOH) Interventions    Readmission Risk Interventions No flowsheet data found.

## 2020-09-01 NOTE — Progress Notes (Signed)
PROGRESS NOTE    Darin Hopkins  LPF:[id number] DOB: August 10, 1960 DOA: 08/27/2020 PCP: Martie Round, NP  Assessment & Plan:   Principal Problem:   COPD exacerbation (HCC) Active Problems:   Essential hypertension   History of seizure   COPD (chronic obstructive pulmonary disease) (HCC)   Hyperlipidemia   GERD (gastroesophageal reflux disease)   Alcohol abuse   Acute respiratory failure with hypoxia (HCC)   Protein-calorie malnutrition, severe     Dyspnea: etiology unclear, possible COPD exacerbation. Continue on steroid taper, continue on bronchodilators and encourage incentive spirometry   Chronic hypoxic respiratory failure: continue on supplemental oxygen, back to baseline. Uses 4L  at home but has been non-complaint w/ oxygen at home recently. Discussed the importance of wearing oxygen 24-7 and pt verbalized his understanding   HTN: continue on coreg. Continue to hold amlodipine for possible ADR of b/l LE edema . 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 Continue to hold lasix, ACE-I   Hyperkalemia: resolved  B/l LE edema: etiology unclear. Continue to hold lasix, amlodipine   GERD: continue on PPI   Alcohol abuse: continue on folic acid, thiamine. Alcohol cessation counseling   Transaminitis: resolved     DVT prophylaxis: lovenox Code Status: full  Family Communication:  Disposition Plan: d/c home w/ home health. Pt's sister is refusing to take the pt home stating she can no longer take care of him   Level of care: Med-Surg   Status is: Inpatient  Remains inpatient appropriate because:Ongoing diagnostic testing needed not appropriate for outpatient work up, Unsafe d/c plan and IV treatments appropriate due to intensity of illness or inability to take PO. Pt's sister is refusing to take the pt home stating she can no longer take care of him. CM is  aware.     Dispo: The patient is from: Home              Anticipated d/c is to: Home              Patient currently: pt is medically stable for d/c   Difficult to place patient Yes    Consultants:      Procedures:   Antimicrobials:   Subjective: Pt denies any complaints   Objective: Vitals:   08/31/20 1155 08/31/20 1530 08/31/20 2116 09/01/20 0530  BP: 107/78 100/77 108/75 (!) 134/91  Pulse: (!) 57 64 (!) 53 (!) 56  Resp: 18 18 18 17   Temp: 98.5 F (36.9 C) 97.8 F (36.6 C) 98.4 F (36.9 C) 97.8 F (36.6 C)  TempSrc: Oral Oral    SpO2: 96% 90% 100% 94%  Weight:      Height:        Intake/Output Summary (Last 24 hours) at 09/01/2020 0718 Last data filed at 09/01/2020 0534 Gross per 24 hour  Intake 480 ml  Output 1125 ml  Net -645 ml   Filed Weights   08/27/20 1048  Weight: 61.2 kg    Examination:  General exam: Appears calm & comfortable. Appears older than stated age  Respiratory system: diminished breath sounds b/l  Cardiovascular system: S1 & S2+. No rubs or gallops   Gastrointestinal system: Abd is soft, NT, ND & normal bowel sounds  Central nervous system: alert and oriented. Moves all extremities  Psychiatry: judgement and insight appear normal. Flat mood and affect     Data Reviewed: I  have personally reviewed following labs and imaging studies  CBC: Recent Labs  Lab 08/27/20 1052 08/28/20 0519 08/29/20 0326 08/30/20 0504 08/31/20 0548 09/01/20 0352  WBC 10.8* 8.5 10.7* 10.2 10.6* 10.0  NEUTROABS 4.9  --   --   --   --   --   HGB 12.6* 12.1* 11.2* 11.8* 12.0* 12.0*  HCT 41.4 38.2* 36.2* 37.4* 37.9* 38.9*  MCV 102.2* 98.7 99.5 99.2 98.2 101.3*  PLT 142* 162 158 168 175 169   Basic Metabolic Panel: Recent Labs  Lab 08/27/20 1304 08/28/20 0519 08/29/20 0326 08/30/20 0504 08/31/20 0548 09/01/20 0352  NA  --  139 137 141 143 140  K  --  5.3* 5.1 4.5 4.5 4.4  CL  --  93* 95* 98 100 101  CO2  --  35* 34* 34* 34* 34*  GLUCOSE   --  86 88 92 83 99  BUN  --  61* 71* 61* 58* 66*  CREATININE  --  1.45* 1.74* 1.57* 1.42* 1.77*  CALCIUM  --  9.3 9.0 9.0 9.1 8.6*  MG 2.1  --   --   --   --   --   PHOS 4.6  --   --   --   --   --    GFR: Estimated Creatinine Clearance: 38.9 mL/min (A) (by C-G formula based on SCr of 1.77 mg/dL (H)). Liver Function Tests: Recent Labs  Lab 08/27/20 1052 08/29/20 0326 08/30/20 0504 08/31/20 0548  AST 66* 31 22 19   ALT 47* 35 28 25  ALKPHOS 39 35* 34* 32*  BILITOT 0.9 0.8 0.7 0.7  PROT 7.4 6.6 6.5 6.6  ALBUMIN 3.9 3.5 3.3* 3.3*   No results for input(s): LIPASE, AMYLASE in the last 168 hours. No results for input(s): AMMONIA in the last 168 hours. Coagulation Profile: No results for input(s): INR, PROTIME in the last 168 hours. Cardiac Enzymes: No results for input(s): CKTOTAL, CKMB, CKMBINDEX, TROPONINI in the last 168 hours. BNP (last 3 results) No results for input(s): PROBNP in the last 8760 hours. HbA1C: No results for input(s): HGBA1C in the last 72 hours. CBG: No results for input(s): GLUCAP in the last 168 hours. Lipid Profile: No results for input(s): CHOL, HDL, LDLCALC, TRIG, CHOLHDL, LDLDIRECT in the last 72 hours. Thyroid Function Tests: No results for input(s): TSH, T4TOTAL, FREET4, T3FREE, THYROIDAB in the last 72 hours. Anemia Panel: No results for input(s): VITAMINB12, FOLATE, FERRITIN, TIBC, IRON, RETICCTPCT in the last 72 hours. Sepsis Labs: Recent Labs  Lab 08/27/20 1144 08/27/20 1304  PROCALCITON  --  <0.10  LATICACIDVEN 2.4* 1.5    Recent Results (from the past 240 hour(s))  Resp Panel by RT-PCR (Flu A&B, Covid) Nasopharyngeal Swab     Status: None   Collection Time: 08/27/20  2:10 PM   Specimen: Nasopharyngeal Swab; Nasopharyngeal(NP) swabs in vial transport medium  Result Value Ref Range Status   SARS Coronavirus 2 by RT PCR NEGATIVE NEGATIVE Final    Comment: (NOTE) SARS-CoV-2 target nucleic acids are NOT DETECTED.  The SARS-CoV-2 RNA  is generally detectable in upper respiratory specimens during the acute phase of infection. The lowest concentration of SARS-CoV-2 viral copies this assay can detect is 138 copies/mL. A negative result does not preclude SARS-Cov-2 infection and should not be used as the sole basis for treatment or other patient management decisions. A negative result may occur with  improper specimen collection/handling, submission of specimen other than nasopharyngeal swab, presence of viral  mutation(s) within the areas targeted by this assay, and inadequate number of viral copies(<138 copies/mL). A negative result must be combined with clinical observations, patient history, and epidemiological information. The expected result is Negative.  Fact Sheet for Patients:  BloggerCourse.com  Fact Sheet for Healthcare Providers:  SeriousBroker.it  This test is no t yet approved or cleared by the [place] FDA and  has been authorized for detection and/or diagnosis of SARS-CoV-2 by FDA under an Emergency Use Authorization (EUA). This EUA will remain  in effect (meaning this test can be used) for the duration of the COVID-19 declaration under Section 564(b)(1) of the Act, 21 U.S.C.section 360bbb-3(b)(1), unless the authorization is terminated  or revoked sooner.       Influenza A by PCR NEGATIVE NEGATIVE Final   Influenza B by PCR NEGATIVE NEGATIVE Final    Comment: (NOTE) The Xpert Xpress SARS-CoV-2/FLU/RSV plus assay is intended as an aid in the diagnosis of influenza from Nasopharyngeal swab specimens and should not be used as a sole basis for treatment. Nasal washings and aspirates are unacceptable for Xpert Xpress SARS-CoV-2/FLU/RSV testing.  Fact Sheet for Patients: BloggerCourse.com  Fact Sheet for Healthcare Providers: SeriousBroker.it  This test is not yet approved or cleared by the  [place] FDA and has been authorized for detection and/or diagnosis of SARS-CoV-2 by FDA under an Emergency Use Authorization (EUA). This EUA will remain in effect (meaning this test can be used) for the duration of the COVID-19 declaration under Section 564(b)(1) of the Act, 21 U.S.C. section 360bbb-3(b)(1), unless the authorization is terminated or revoked.  Performed at [facility], [address], [place], [place] [address]          Radiology Studies: No results found.      Scheduled Meds: . aspirin  81 mg Oral Daily  . atorvastatin  20 mg Oral Daily  . carvedilol  20 mg Oral Daily  . enoxaparin (LOVENOX) injection  40 mg Subcutaneous Q24H  . folic acid  1 mg Oral Daily  . levETIRAcetam  500 mg Oral BID  . methylPREDNISolone (SOLU-MEDROL) injection  60 mg Intravenous Daily  . pantoprazole  40 mg Oral Daily  . thiamine  100 mg Oral Daily  . tiotropium  1 capsule Inhalation Daily   Continuous Infusions:   LOS: 4 days    Time spent: 28 mins     Charise Killian, MD [facility] Pager [phone]  If 7PM-7AM, please contact night-coverage 09/01/2020, 7:18 AM

## 2020-09-02 DIAGNOSIS — J9601 Acute respiratory failure with hypoxia: Secondary | ICD-10-CM

## 2020-09-02 LAB — CBC
HCT: 36.3 % — ABNORMAL LOW (ref 39.0–52.0)
Hemoglobin: 11.4 g/dL — ABNORMAL LOW (ref 13.0–17.0)
MCH: 31.8 pg (ref 26.0–34.0)
MCHC: 31.4 g/dL (ref 30.0–36.0)
MCV: 101.1 fL — ABNORMAL HIGH (ref 80.0–100.0)
Platelets: 173 10*3/uL (ref 150–400)
RBC: 3.59 MIL/uL — ABNORMAL LOW (ref 4.22–5.81)
RDW: 12.8 % (ref 11.5–15.5)
WBC: 9.5 10*3/uL (ref 4.0–10.5)
nRBC: 0 % (ref 0.0–0.2)

## 2020-09-02 LAB — BASIC METABOLIC PANEL
Anion gap: 6 (ref 5–15)
BUN: 57 mg/dL — ABNORMAL HIGH (ref 6–20)
CO2: 33 mmol/L — ABNORMAL HIGH (ref 22–32)
Calcium: 8.9 mg/dL (ref 8.9–10.3)
Chloride: 102 mmol/L (ref 98–111)
Creatinine, Ser: 1.43 mg/dL — ABNORMAL HIGH (ref 0.61–1.24)
GFR, Estimated: 56 mL/min — ABNORMAL LOW (ref >60)
Glucose, Bld: 90 mg/dL (ref 70–99)
Potassium: 4.5 mmol/L (ref 3.5–5.1)
Sodium: 141 mmol/L (ref 135–145)

## 2020-09-02 MED ORDER — PREDNISONE 20 MG PO TABS: 40.0000 mg | ORAL_TABLET | Freq: Every day | ORAL | 0 refills | Status: AC

## 2020-09-02 NOTE — Progress Notes (Signed)
Physical Therapy Treatment Patient Details Name: Darin Hopkins MRN: [id number] DOB: Apr 20, 1961 Today's Date: 09/02/2020    History of Present Illness Pt is a 60 y/o M admitted on 08/27/20 with c/c of SOB. Pt being treated for COPD exacerbation. PMH: HTN, GERD, heart failure exacerbation, siezures, CKD3, HLD, COPD, CVA, alcohol & tobacco abuse    PT Comments    Pt resting in bed upon PT arrival; agreeable to PT session.  Modified independent with bed mobility; SBA with transfers using RW; CGA to SBA ambulating in hallway with RW; and CGA to SBA navigating 4 steps with B railings.  Pt steady (no loss of balance) during sessions activities using RW.  Pt's O2 sats initially were 80% beginning of session (pt resting in bed) upon therapist arrival to pt's room (O2 tubing noted to be disconnected from wall so pt was on room air); O2 increased to 4 L prior to any activity to improve O2 sats (pt reports using 4 L for ambulation but 3 L for rest) and O2 sats improved to 91% or greater during functional mobility; O2 sats 88% on 3 L O2 via nasal cannula end of session at rest: nurse notified of above regarding pt's O2.  Pt reports plan to discharge home today.   Follow Up Recommendations  Home health PT;Supervision for mobility/OOB     Equipment Recommendations  None recommended by PT (pt already has RW)    Recommendations for Other Services       Precautions / Restrictions Precautions Precautions: Fall Precaution Comments: Seizure precautions Restrictions Weight Bearing Restrictions: No    Mobility  Bed Mobility Overal bed mobility: Modified Independent Bed Mobility: Supine to Sit;Sit to Supine     Supine to sit: Modified independent (Device/Increase time);HOB elevated Sit to supine: Modified independent (Device/Increase time);HOB elevated   General bed mobility comments: no difficulties noted    Transfers Overall transfer level: Needs assistance Equipment used: Rolling walker (2  wheeled) Transfers: Sit to/from Stand Sit to Stand: Supervision Stand pivot transfers: Supervision       General transfer comment: steady transfers with use of RW  Ambulation/Gait Ambulation/Gait assistance: Min guard;Supervision Gait Distance (Feet):  (200 feet total (100 feet; performed stairs; and then 100 feet--no rest break between activities)) Assistive device: Rolling walker (2 wheeled)   Gait velocity: mildly decreased   General Gait Details: step through gait pattern; steady with RW use   Stairs Stairs: Yes Stairs assistance: Min guard;Supervision Stair Management: Two rails;Step to pattern;Forwards Number of Stairs: 4 General stair comments: steady safe stairs navigation   Wheelchair Mobility    Modified Rankin (Stroke Patients Only)       Balance Overall balance assessment: Needs assistance Sitting-balance support: No upper extremity supported;Feet supported Sitting balance-Leahy Scale: Normal Sitting balance - Comments: steady sitting reaching outside BOS   Standing balance support: Bilateral upper extremity supported;During functional activity Standing balance-Leahy Scale: Good Standing balance comment: steady ambulation with use of RW                            Cognition Arousal/Alertness: Awake/alert Behavior During Therapy: WFL for tasks assessed/performed Overall Cognitive Status: Within Functional Limits for tasks assessed                                        Exercises      General Comments  Nursing cleared pt for participation in physical therapy.  Pt agreeable to PT session.      Pertinent Vitals/Pain      Home Living                      Prior Function            PT Goals (current goals can now be found in the care plan section) Acute Rehab PT Goals Patient Stated Goal: get better PT Goal Formulation: With patient Time For Goal Achievement: 09/12/20 Potential to Achieve Goals:  Good Progress towards PT goals: Progressing toward goals    Frequency    Min 2X/week      PT Plan Current plan remains appropriate    Co-evaluation              AM-PAC PT "6 Clicks" Mobility   Outcome Measure  Help needed turning from your back to your side while in a flat bed without using bedrails?: None Help needed moving from lying on your back to sitting on the side of a flat bed without using bedrails?: None Help needed moving to and from a bed to a chair (including a wheelchair)?: A Little Help needed standing up from a chair using your arms (e.g., wheelchair or bedside chair)?: A Little Help needed to walk in hospital room?: A Little Help needed climbing 3-5 steps with a railing? : A Little 6 Click Score: 20    End of Session Equipment Utilized During Treatment: Oxygen;Gait belt Activity Tolerance: Patient tolerated treatment well Patient left: in bed;with call bell/phone within reach;with bed alarm set Nurse Communication: Mobility status;Precautions;Other (comment) (O2 concerns beginning of session) PT Visit Diagnosis: Unsteadiness on feet (R26.81);Muscle weakness (generalized) (M62.81);History of falling (Z91.81)     Time: 6734-1937 PT Time Calculation (min) (ACUTE ONLY): 30 min  Charges:  $Gait Training: 8-22 mins $Therapeutic Activity: 8-22 mins                    Hendricks Limes, PT 09/02/20, 3:37 PM

## 2020-09-02 NOTE — TOC Progression Note (Addendum)
Transition of Care [facility]) - Progression Note    Patient Details  Name: Darin Hopkins MRN: [id number] Date of Birth: Jul 20, 1960  [facility]) CM/SW Contact  Barrie Dunker, RN Phone Number: 09/02/2020, 3:28 PM  Clinical Narrative:   [facility] transportation and cancelled the uber scheduled due to the patient needing Oxygen and not having his home oxygen at the Hospital,  The patient is on 4 liters, I requested Door to door instead to go home, The door to door is is booked for the day, They asked that I call EMS, they are not able to at this time   Expected Discharge Plan: Home w Home Health Services Barriers to Discharge: Barriers Resolved  Expected Discharge Plan and Services Expected Discharge Plan: Home w Home Health Services   Discharge Planning Services: CM Consult     Expected Discharge Date: 09/02/20               DME Arranged: N/A         HH Arranged: NA           Social Determinants of Health (SDOH) Interventions    Readmission Risk Interventions No flowsheet data found.

## 2020-09-02 NOTE — Plan of Care (Signed)
  Problem: Education: Goal: Knowledge of General Education information will improve Description: Including pain rating scale, medication(s)/side effects and non-pharmacologic comfort measures 09/02/2020 1522 by Jean Rosenthal, RN Outcome: Progressing 09/02/2020 1357 by Jean Rosenthal, RN Outcome: Adequate for Discharge   Problem: Health Behavior/Discharge Planning: Goal: Ability to manage health-related needs will improve 09/02/2020 1522 by Jean Rosenthal, RN Outcome: Progressing 09/02/2020 1357 by Jean Rosenthal, RN Outcome: Adequate for Discharge   Problem: Clinical Measurements: Goal: Ability to maintain clinical measurements within normal limits will improve 09/02/2020 1522 by Jean Rosenthal, RN Outcome: Progressing 09/02/2020 1357 by Jean Rosenthal, RN Outcome: Adequate for Discharge Goal: Will remain free from infection 09/02/2020 1522 by Jean Rosenthal, RN Outcome: Progressing 09/02/2020 1357 by Jean Rosenthal, RN Outcome: Adequate for Discharge Goal: Diagnostic test results will improve 09/02/2020 1522 by Jean Rosenthal, RN Outcome: Progressing 09/02/2020 1357 by Jean Rosenthal, RN Outcome: Adequate for Discharge Goal: Respiratory complications will improve 09/02/2020 1522 by Jean Rosenthal, RN Outcome: Progressing 09/02/2020 1357 by Jean Rosenthal, RN Outcome: Adequate for Discharge   Problem: Activity: Goal: Risk for activity intolerance will decrease 09/02/2020 1522 by Jean Rosenthal, RN Outcome: Progressing 09/02/2020 1357 by Jean Rosenthal, RN Outcome: Adequate for Discharge   Problem: Nutrition: Goal: Adequate nutrition will be maintained 09/02/2020 1522 by Jean Rosenthal, RN Outcome: Progressing 09/02/2020 1357 by Jean Rosenthal, RN Outcome: Adequate for Discharge   Problem: Elimination: Goal: Will not experience complications related to bowel motility 09/02/2020 1522 by Jean Rosenthal, RN Outcome:  Progressing 09/02/2020 1357 by Jean Rosenthal, RN Outcome: Adequate for Discharge Goal: Will not experience complications related to urinary retention 09/02/2020 1522 by Jean Rosenthal, RN Outcome: Progressing 09/02/2020 1357 by Jean Rosenthal, RN Outcome: Adequate for Discharge   Problem: Safety: Goal: Ability to remain free from injury will improve 09/02/2020 1522 by Jean Rosenthal, RN Outcome: Progressing 09/02/2020 1357 by Jean Rosenthal, RN Outcome: Adequate for Discharge   Problem: Skin Integrity: Goal: Risk for impaired skin integrity will decrease 09/02/2020 1522 by Jean Rosenthal, RN Outcome: Progressing 09/02/2020 1357 by Jean Rosenthal, RN Outcome: Adequate for Discharge   Problem: Education: Goal: Knowledge of disease or condition will improve 09/02/2020 1522 by Jean Rosenthal, RN Outcome: Progressing 09/02/2020 1357 by Jean Rosenthal, RN Outcome: Adequate for Discharge Goal: Knowledge of the prescribed therapeutic regimen will improve 09/02/2020 1522 by Jean Rosenthal, RN Outcome: Progressing 09/02/2020 1357 by Jean Rosenthal, RN Outcome: Adequate for Discharge   Problem: Activity: Goal: Ability to tolerate increased activity will improve 09/02/2020 1522 by Jean Rosenthal, RN Outcome: Progressing 09/02/2020 1357 by Jean Rosenthal, RN Outcome: Adequate for Discharge Goal: Will verbalize the importance of balancing activity with adequate rest periods 09/02/2020 1522 by Jean Rosenthal, RN Outcome: Progressing 09/02/2020 1357 by Jean Rosenthal, RN Outcome: Adequate for Discharge   Problem: Activity: Goal: Will verbalize the importance of balancing activity with adequate rest periods 09/02/2020 1522 by Jean Rosenthal, RN Outcome: Progressing 09/02/2020 1357 by Jean Rosenthal, RN Outcome: Adequate for Discharge

## 2020-09-02 NOTE — Discharge Summary (Signed)
Physician Discharge Summary  [name]:[id number] DOB: 08-Dec-1960 DOA: 08/27/2020  PCP: Martie Round, NP  Admit date: 08/27/2020 Discharge date: 09/02/2020  Admitted From: home  Disposition: home w/ home health   Recommendations for Outpatient Follow-up:  1. Follow up with PCP in 1-2 weeks  Home Health: yes Equipment/Devices:  Discharge Condition: stable  CODE STATUS: full  Diet recommendation: Heart Healthy  Brief/Interim Summary: HPI was taken from Dr. Sedalia Muta: Darin Hopkins is a 60 y.o. male with medical history significant for hypertension, GERD, history of heart failure exacerbation, history of seizures, last seizure was approximately 2 years ago, CKD 3, hyperlipidemia, COPD, history of CVA about 2.5 years ago, alcohol abuse, tobacco abuse, presents to the emergency department for chief concerns of shortness of breath.  Patient was out walking in the woods when he developed shortness of breath.  He did not take his oxygen with him.  He states he was walking for less than 30 minutes.  He denies fever, dysuria, hematuria, nausea, vomiting.  He endorses a dry cough.  She denies syncope or loss of consciousness or rashes on his body. He endorses bilateral lower extremity swelling however states that this has improved significantly since he was started on medications from previous hospitalization.  Social history: He lives with his sister.  He endorses daily tobacco use.  He is trying to quit EtOH use formally he drank 1/5 of alcohol per day and smokes half a pack per day.  He states that the last time he had an alcoholic beverage was prior to presentation to the ED on 08/17/2020.  He denies recreational drug use.  He does not work due to current disability.  Vaccination: He is vaccinated for COVID-19, 3 doses   Hospital course from Dr. Mayford Knife 4/21-4/26/22: Pt presented w/ shortness of breath likely secondary to not wearing his oxygen at home and COPD exacerbation. Pt was  treated w/ steroids, bronchodilators, incentive spirometry and oxygen. Pt's oxygen was weaned back to baseline prior to d/c. PT/OT evaluated the pt and recommend home health. Home health was set up by CM prior to d/c. For more information, please see previous progress notes.   Discharge Diagnoses:  Principal Problem:   COPD exacerbation (HCC) Active Problems:   Essential hypertension   History of seizure   COPD (chronic obstructive pulmonary disease) (HCC)   Hyperlipidemia   GERD (gastroesophageal reflux disease)   Alcohol abuse   Acute respiratory failure with hypoxia (HCC)   Protein-calorie malnutrition, severe  Dyspnea: etiology unclear, possible COPD exacerbation. Continue on steroid taper, continue on bronchodilators and encourage incentive spirometry   Chronic hypoxic respiratory failure: continue on supplemental oxygen, back to baseline. Uses 4L [place] at home but has been non-complaint w/ oxygen at home recently. Discussed the importance of wearing oxygen 24-7 and pt verbalized his understanding   HTN: continue on coreg. Continue to hold amlodipine for possible ADR of b/l LE edema . Hold ACE-I secondary to worsening Cr  HLD: continue on statin   Hx of seizures: continue on home dose keppra  CKDIIIa: Cr is trending down from day prior.   Hyperkalemia: resolved  B/l LE edema: etiology unclear. Improved, possible ADR from amlodipine use. Can f/u w/ PCP to determine if to continue this medication or d/c this medication  GERD: continue on PPI   Alcohol abuse: continue on folic acid, thiamine. Alcohol cessation counseling   Transaminitis: resolved   Discharge Instructions  Discharge Instructions    Diet - low sodium heart  healthy   Complete by: As directed    Discharge instructions   Complete by: As directed    F/u w/ PCP in 1 week   Increase activity slowly   Complete by: As directed      Allergies as of 09/02/2020   No Known Allergies     Medication List     STOP taking these medications   chlorthalidone 25 MG tablet Commonly known as: HYGROTON   simvastatin 20 MG tablet Commonly known as: ZOCOR     TAKE these medications   amLODipine 10 MG tablet Commonly known as: NORVASC Take 1 tablet by mouth daily.   aspirin 81 MG EC tablet Take by mouth.   atorvastatin 20 MG tablet Commonly known as: LIPITOR Take 1 tablet by mouth daily.   carvedilol 25 MG tablet Commonly known as: COREG Take by mouth.   doxazosin 2 MG tablet Commonly known as: CARDURA Take 2 mg by mouth daily.   folic acid 1 MG tablet Commonly known as: FOLVITE Take 1 mg by mouth daily.   furosemide 20 MG tablet Commonly known as: LASIX Take 3 tablets (60 mg total) by mouth daily.   levETIRAcetam 500 MG tablet Commonly known as: KEPPRA Take 500 mg by mouth 2 (two) times daily.   lisinopril 5 MG tablet Commonly known as: ZESTRIL Take 5 mg by mouth daily.   omeprazole 20 MG capsule Commonly known as: PRILOSEC Take 1 capsule by mouth daily.   predniSONE 20 MG tablet Commonly known as: DELTASONE Take 2 tablets (40 mg total) by mouth daily for 5 days.   ProAir HFA 108 (90 Base) MCG/ACT inhaler Generic drug: albuterol Inhale into the lungs.   Spiriva HandiHaler 18 MCG inhalation capsule Generic drug: tiotropium 1 capsule daily.   thiamine 100 MG tablet Take 1 tablet (100 mg total) by mouth daily. What changed: Another medication with the same name was removed. Continue taking this medication, and follow the directions you see here.       No Known Allergies  Consultations:     Procedures/Studies: DG Chest 2 View  Result Date: 08/27/2020 CLINICAL DATA:  Cough, shortness of breath. EXAM: CHEST - 2 VIEW COMPARISON:  August 19, 2020. FINDINGS: The heart size and mediastinal contours are within normal limits. Both lungs are clear. No pneumothorax or pleural effusion is noted. The visualized skeletal structures are unremarkable. IMPRESSION: No  active cardiopulmonary disease. Electronically Signed   By: Lupita RaiderJames  Green Jr M.D.   On: 08/27/2020 11:59   CT Head Wo Contrast  Result Date: 08/27/2020 CLINICAL DATA:  Delirium. EXAM: CT HEAD WITHOUT CONTRAST TECHNIQUE: Contiguous axial images were obtained from the base of the skull through the vertex without intravenous contrast. COMPARISON:  November 01, 2013. 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 No mass effect or midline shift is noted. Ventricular size is within normal limits. There is no evidence of mass lesion, hemorrhage or acute infarction. Vascular: No hyperdense vessel or unexpected calcification. Skull: Normal. Negative for fracture or focal lesion. Sinuses/Orbits: No acute finding. Other: None. IMPRESSION: No acute intracranial abnormality seen. Electronically Signed   By: Lupita RaiderJames  Green Jr M.D.   On: 08/27/2020 12:52   CT Chest High Resolution  Result Date: 08/20/2020 CLINICAL DATA:  60 year old male with history of hypoxemia. Evaluate for interstitial lung disease. EXAM: CT CHEST WITHOUT CONTRAST TECHNIQUE: Multidetector CT imaging of the chest was performed following the standard protocol without intravenous contrast. High resolution imaging of the lungs, as  well as inspiratory and expiratory imaging, was performed. COMPARISON:  No priors. FINDINGS: Cardiovascular: Heart size is normal. There is no significant pericardial fluid, thickening or pericardial calcification. There is aortic atherosclerosis, as well as atherosclerosis of the great vessels of the mediastinum and the coronary arteries, including calcified atherosclerotic plaque in the left main, left anterior descending, left circumflex and right coronary arteries. Calcifications of the aortic valve. Mediastinum/Nodes: No pathologically enlarged mediastinal or hilar lymph nodes. Please note that accurate exclusion of hilar adenopathy is limited on noncontrast CT scans. Esophagus is  unremarkable in appearance. No axillary lymphadenopathy. Lungs/Pleura: Diffuse bronchial wall thickening with mild to moderate centrilobular and paraseptal emphysema. High-resolution images demonstrate no definite regions of ground-glass attenuation, septal thickening, subpleural reticulation, traction bronchiectasis or frank honeycombing to indicate interstitial lung disease. Inspiratory and expiratory imaging is unremarkable in appearance. Small bilateral pleural effusions with areas of dependent subsegmental atelectasis in the lower lobes of the lungs bilaterally. Upper Abdomen: Aortic atherosclerosis. Musculoskeletal: There are no aggressive appearing lytic or blastic lesions noted in the visualized portions of the skeleton. IMPRESSION: 1. No findings to suggest interstitial lung disease. 2. Diffuse bronchial wall thickening with mild to moderate centrilobular and paraseptal emphysema; imaging findings suggestive of underlying COPD. 3. Small bilateral pleural effusions with bibasilar areas of subsegmental atelectasis in the lower lobes of the lungs bilaterally. 4. Aortic atherosclerosis, in addition to left main and 3 vessel coronary artery disease. Please note that although the presence of coronary artery calcium documents the presence of coronary artery disease, the severity of this disease and any potential stenosis cannot be assessed on this non-gated CT examination. Assessment for potential risk factor modification, dietary therapy or pharmacologic therapy may be warranted, if clinically indicated. 5. There are calcifications of the aortic valve. Echocardiographic correlation for evaluation of potential valvular dysfunction may be warranted if clinically indicated. Aortic Atherosclerosis (ICD10-I70.0). Electronically Signed   By: Trudie Reed M.D.   On: 08/20/2020 20:51   NM Myocar Multi W/Spect W/Wall Motion / EF  Result Date: 08/22/2020  Low risk, probably normal pharmacologic myocardial  perfusion stress test.  There is a small in size, moderate in severity, fixed basal inferior defect most likely representing artifact but cannot rule out a small area of scar.  There is no evidence of significant ischemia.  The left ventricular ejection fraction is normal (64%).  Coronary artery calcifications are present. Small bilateral pleural effusion are noted on the attenuation correction CT.    DG Chest Port 1 View  Result Date: 08/19/2020 CLINICAL DATA:  Respiratory failure EXAM: PORTABLE CHEST 1 VIEW COMPARISON:  08/17/2020 FINDINGS: Cardiac shadow is enlarged but stable somewhat accentuated by the frontal technique. Increasing bibasilar opacity is noted consistent with focal infiltrate and small effusion. IMPRESSION: Increasing bibasilar airspace opacities with associated effusions. Electronically Signed   By: Alcide Clever M.D.   On: 08/19/2020 10:48   DG Chest Portable 1 View  Result Date: 08/17/2020 CLINICAL DATA:  Shortness of breath. EXAM: PORTABLE CHEST 1 VIEW COMPARISON:  November 01, 2013 FINDINGS: Cardiomediastinal silhouette is normal. Mediastinal contours appear intact. Coarsening of the interstitial markings in the lower lobes may represent peribronchial airspace consolidation. Osseous structures are without acute abnormality. Soft tissues are grossly normal. IMPRESSION: Coarsening of the interstitial markings in the lower lobes may represent peribronchial airspace consolidation, suggestive of bronchitis or atypical pneumonia. Electronically Signed   By: Ted Mcalpine M.D.   On: 08/17/2020 10:57   ECHOCARDIOGRAM COMPLETE  Result Date: 08/18/2020  ECHOCARDIOGRAM REPORT   Patient Name:   Darin Hopkins Date of Exam: 08/18/2020 Medical Rec #:  [id number]       Height:       67.0 in Accession #:    [id number]      Weight:       139.1 lb Date of Birth:  10/21/1960       BSA:          1.733 m Patient Age:    59 years        BP:           109/79 mmHg Patient Gender: M                HR:           63 bpm. Exam Location:  [facility] Procedure: 2D Echo, Cardiac Doppler, Color Doppler and Strain Analysis Indications:     Dyspnea R06.00  History:         Patient has no prior history of Echocardiogram examinations.                  COPD; Risk Factors:Hypertension.  Sonographer:     Cristela Blue RDCS (AE) Referring Phys:  [id number] AMY N COX Diagnosing Phys: Yvonne Kendall MD  Sonographer Comments: Global longitudinal strain was attempted. IMPRESSIONS  1. Left ventricular ejection fraction, by estimation, is 60 to 65%. The left ventricle has normal function. The left ventricle has no regional wall motion abnormalities. There is mild left ventricular hypertrophy. Left ventricular diastolic parameters were normal. The average left ventricular global longitudinal strain is -15.1 %. The global longitudinal strain is abnormal.  2. Right ventricular systolic function is normal. The right ventricular size is normal. There is normal pulmonary artery systolic pressure.  3. Left atrial size was mildly dilated.  4. Right atrial size was moderately dilated.  5. The mitral valve is normal in structure. Trivial mitral valve regurgitation. No evidence of mitral stenosis.  6. The aortic valve is bicuspid. There is mild calcification of the aortic valve. There is moderate thickening of the aortic valve. Aortic valve regurgitation is mild to moderate. Moderate aortic valve stenosis.  7. Aortic dilatation noted. There is mild dilatation of the aortic root, measuring 41 mm. There is mild dilatation of the ascending aorta, measuring 40 mm.  8. The inferior vena cava is normal in size with greater than 50% respiratory variability, suggesting right atrial pressure of 3 mmHg. FINDINGS  Left Ventricle: Left ventricular ejection fraction, by estimation, is 60 to 65%. The left ventricle has normal function. The left ventricle has no regional wall motion abnormalities. The average left ventricular global longitudinal strain is -15.1 %.  The global longitudinal strain is abnormal. The left ventricular internal cavity size was normal in size. There is mild left ventricular hypertrophy. Left ventricular diastolic parameters were normal. Right Ventricle: The right ventricular size is normal. No increase in right ventricular wall thickness. Right ventricular systolic function is normal. There is normal pulmonary artery systolic pressure. The tricuspid regurgitant velocity is 2.16 m/s, and  with an assumed right atrial pressure of 3 mmHg, the estimated right ventricular systolic pressure is 21.7 mmHg. Left Atrium: Left atrial size was mildly dilated. Right Atrium: Right atrial size was moderately dilated. Pericardium: There is no evidence of pericardial effusion. Mitral Valve: The mitral valve is normal in structure. Trivial mitral valve regurgitation. No evidence of mitral valve stenosis. Tricuspid Valve: The tricuspid valve is normal in structure. Tricuspid valve regurgitation is trivial.  Aortic Valve: The aortic valve is bicuspid. There is mild calcification of the aortic valve. There is moderate thickening of the aortic valve. Aortic valve regurgitation is mild to moderate. Aortic regurgitation PHT measures 475 msec. Moderate aortic stenosis is present. Aortic valve mean gradient measures 18.0 mmHg. Aortic valve peak gradient measures 30.0 mmHg. Aortic valve area, by VTI measures 1.31 cm. Pulmonic Valve: The pulmonic valve was not well visualized. Pulmonic valve regurgitation is not visualized. No evidence of pulmonic stenosis. Aorta: Aortic dilatation noted. There is mild dilatation of the aortic root, measuring 41 mm. There is mild dilatation of the ascending aorta, measuring 40 mm. Pulmonary Artery: The pulmonary artery is not well seen. Venous: The inferior vena cava is normal in size with greater than 50% respiratory variability, suggesting right atrial pressure of 3 mmHg. IAS/Shunts: The interatrial septum was not well visualized.  LEFT  VENTRICLE PLAX 2D LVIDd:         5.24 cm  Diastology LVIDs:         3.07 cm  LV e' medial:    9.90 cm/s LV PW:         1.42 cm  LV E/e' medial:  11.2 LV IVS:        0.90 cm  LV e' lateral:   11.40 cm/s LVOT diam:     2.20 cm  LV E/e' lateral: 9.7 LV SV:         69 LV SV Index:   40       2D Longitudinal Strain LVOT Area:     3.80 cm 2D Strain GLS Avg:     -15.1 %  RIGHT VENTRICLE RV Basal diam:  3.77 cm RV S prime:     13.40 cm/s TAPSE (M-mode): 2.2 cm LEFT ATRIUM             Index       RIGHT ATRIUM           Index LA diam:        3.70 cm 2.13 cm/m  RA Area:     25.20 cm LA Vol (A2C):   73.3 ml 42.29 ml/m RA Volume:   91.70 ml  52.91 ml/m LA Vol (A4C):   63.9 ml 36.87 ml/m LA Biplane Vol: 70.6 ml 40.74 ml/m  AORTIC VALVE                    PULMONIC VALVE AV Area (Vmax):    1.15 cm     PV Vmax:        0.60 m/s AV Area (Vmean):   1.12 cm     PV Peak grad:   1.4 mmHg AV Area (VTI):     1.31 cm     RVOT Peak grad: 2 mmHg AV Vmax:           274.00 cm/s AV Vmean:          197.000 cm/s AV VTI:            0.525 m AV Peak Grad:      30.0 mmHg AV Mean Grad:      18.0 mmHg LVOT Vmax:         82.90 cm/s LVOT Vmean:        58.100 cm/s LVOT VTI:          0.181 m LVOT/AV VTI ratio: 0.34 AI PHT:            475 msec  AORTA Ao Root diam: 3.83 cm  MITRAL VALVE                TRICUSPID VALVE MV Area (PHT): 3.26 cm     TR Peak grad:   18.7 mmHg MV Decel Time: 233 msec     TR Vmax:        216.00 cm/s MV E velocity: 111.00 cm/s MV A velocity: 111.00 cm/s  SHUNTS MV E/A ratio:  1.00         Systemic VTI:  0.18 m                             Systemic Diam: 2.20 cm Yvonne Kendall MD Electronically signed by Yvonne Kendall MD Signature Date/Time: 08/18/2020/1:04:14 PM    Final       Subjective: Pt c/o fatigue    Discharge Exam: Vitals:   09/02/20 0807 09/02/20 1201  BP: (!) 129/91 116/81  Pulse: 60 64  Resp: 16 16  Temp: 97.9 F (36.6 C) 98.7 F (37.1 C)  SpO2: 91% 98%   Vitals:   09/01/20 2019 09/02/20 0619  09/02/20 0807 09/02/20 1201  BP: 121/86 (!) 138/94 (!) 129/91 116/81  Pulse: (!) 56 (!) 55 60 64  Resp: 17 16 16 16   Temp: 98.7 F (37.1 C) 97.8 F (36.6 C) 97.9 F (36.6 C) 98.7 F (37.1 C)  TempSrc: Oral Oral    SpO2: 93% 96% 91% 98%  Weight:      Height:        General: Pt is alert, awake, not in acute distress Cardiovascular: S1/S2 +, no rubs, no gallops Respiratory: diminished breath sounds b/l Abdominal: Soft, NT, ND, bowel sounds + Extremities: no cyanosis    The results of significant diagnostics from this hospitalization (including imaging, microbiology, ancillary and laboratory) are listed below for reference.     Microbiology: Recent Results (from the past 240 hour(s))  Resp Panel by RT-PCR (Flu A&B, Covid) Nasopharyngeal Swab     Status: None   Collection Time: 08/27/20  2:10 PM   Specimen: Nasopharyngeal Swab; Nasopharyngeal(NP) swabs in vial transport medium  Result Value Ref Range Status   SARS Coronavirus 2 by RT PCR NEGATIVE NEGATIVE Final    Comment: (NOTE) SARS-CoV-2 target nucleic acids are NOT DETECTED.  The SARS-CoV-2 RNA is generally detectable in upper respiratory specimens during the acute phase of infection. The lowest concentration of SARS-CoV-2 viral copies this assay can detect is 138 copies/mL. A negative result does not preclude SARS-Cov-2 infection and should not be used as the sole basis for treatment or other patient management decisions. A negative result may occur with  improper specimen collection/handling, submission of specimen other than nasopharyngeal swab, presence of viral mutation(s) within the areas targeted by this assay, and inadequate number of viral copies(<138 copies/mL). A negative result must be combined with clinical observations, patient history, and epidemiological information. The expected result is Negative.  Fact Sheet for Patients:  08/29/20  Fact Sheet for Healthcare  Providers:  BloggerCourse.com  This test is no t yet approved or cleared by the [place] FDA and  has been authorized for detection and/or diagnosis of SARS-CoV-2 by FDA under an Emergency Use Authorization (EUA). This EUA will remain  in effect (meaning this test can be used) for the duration of the COVID-19 declaration under Section 564(b)(1) of the Act, 21 U.S.C.section 360bbb-3(b)(1), unless the authorization is terminated  or revoked sooner.       Influenza A by PCR  NEGATIVE NEGATIVE Final   Influenza B by PCR NEGATIVE NEGATIVE Final    Comment: (NOTE) The Xpert Xpress SARS-CoV-2/FLU/RSV plus assay is intended as an aid in the diagnosis of influenza from Nasopharyngeal swab specimens and should not be used as a sole basis for treatment. Nasal washings and aspirates are unacceptable for Xpert Xpress SARS-CoV-2/FLU/RSV testing.  Fact Sheet for Patients: BloggerCourse.com  Fact Sheet for Healthcare Providers: [employer]ious[employer]ker[employer]it  This test is not yet approved or cleared by the [place] FDA and has been authorized for detection and/or diagnosis of SARS-CoV-2 by FDA under an Emergency Use Authorization (EUA). This EUA will remain in effect (meaning this test can be used) for the duration of the COVID-19 declaration under Section 564(b)(1) of the Act, 21 U.S.C. section 360bbb-3(b)(1), unless the authorization is terminated or revoked.  Performed at [facility], [address] [place], [place] [address]      Labs: BNP (last 3 results) Recent Labs    08/17/20 1037 08/27/20 1304  BNP 1,306.1* 285.0*   Basic Metabolic Panel: Recent Labs  Lab 08/27/20 1304 08/28/20 0519 08/29/20 0326 08/30/20 0504 08/31/20 0548 09/01/20 0352 09/02/20 0538  NA  --    < > 137 141 143 140 141  K  --    < > 5.1 4.5 4.5 4.4 4.5  CL  --    < > 95* 98 100 101 102  CO2  --    < > 34* 34* 34* 34*  33*  GLUCOSE  --    < > 88 92 83 99 90  BUN  --    < > 71* 61* 58* 66* 57*  CREATININE  --    < > 1.74* 1.57* 1.42* 1.77* 1.43*  CALCIUM  --    < > 9.0 9.0 9.1 8.6* 8.9  MG 2.1  --   --   --   --   --   --   PHOS 4.6  --   --   --   --   --   --    < > = values in this interval not displayed.   Liver Function Tests: Recent Labs  Lab 08/27/20 1052 08/29/20 0326 08/30/20 0504 08/31/20 0548  AST 66* ALT 47* 35 28 25  ALKPHOS 39 35* 34* 32*  BILITOT 0.9 0.8 0.7 0.7  PROT 7.4 6.6 6.5 6.6  ALBUMIN 3.9 3.5 3.3* 3.3*   No results for input(s): LIPASE, AMYLASE in the last 168 hours. No results for input(s): AMMONIA in the last 168 hours. CBC: Recent Labs  Lab 08/27/20 1052 08/28/20 0519 08/29/20 0326 08/30/20 0504 08/31/20 0548 09/01/20 0352 09/02/20 0538  WBC 10.8*   < > 10.7* 10.2 10.6* 10.0 9.5  NEUTROABS 4.9  --   --   --   --   --   --   HGB 12.6*   < > 11.2* 11.8* 12.0* 12.0* 11.4*  HCT 41.4   < > 36.2* 37.4* 37.9* 38.9* 36.3*  MCV 102.2*   < > 99.5 99.2 98.2 101.3* 101.1*  PLT 142*   < > 158 168 175 169 173   < > = values in this interval not displayed.   Cardiac Enzymes: No results for input(s): CKTOTAL, CKMB, CKMBINDEX, TROPONINI in the last 168 hours. BNP: Invalid input(s): POCBNP CBG: No results for input(s): GLUCAP in the last 168 hours. D-Dimer No results for input(s): DDIMER in the last 72 hours. Hgb A1c No results for  input(s): HGBA1C in the last 72 hours. Lipid Profile No results for input(s): CHOL, HDL, LDLCALC, TRIG, CHOLHDL, LDLDIRECT in the last 72 hours. Thyroid function studies No results for input(s): TSH, T4TOTAL, T3FREE, THYROIDAB in the last 72 hours.  Invalid input(s): FREET3 Anemia work up No results for input(s): VITAMINB12, FOLATE, FERRITIN, TIBC, IRON, RETICCTPCT in the last 72 hours. Urinalysis    Component Value Date/Time   COLORURINE YELLOW (A) 08/27/2020 1052   APPEARANCEUR HAZY (A) 08/27/2020 1052   APPEARANCEUR  Clear 11/02/2013 0205   LABSPEC 1.014 08/27/2020 1052   LABSPEC 1.005 11/02/2013 0205   PHURINE 6.0 08/27/2020 1052   GLUCOSEU NEGATIVE 08/27/2020 1052   GLUCOSEU Negative 11/02/2013 0205   HGBUR NEGATIVE 08/27/2020 1052   BILIRUBINUR NEGATIVE 08/27/2020 1052   BILIRUBINUR Negative 11/02/2013 0205   KETONESUR NEGATIVE 08/27/2020 1052   PROTEINUR >=300 (A) 08/27/2020 1052   NITRITE NEGATIVE 08/27/2020 1052   LEUKOCYTESUR NEGATIVE 08/27/2020 1052   LEUKOCYTESUR Negative 11/02/2013 0205   Sepsis Labs Invalid input(s): PROCALCITONIN,  WBC,  LACTICIDVEN Microbiology Recent Results (from the past 240 hour(s))  Resp Panel by RT-PCR (Flu A&B, Covid) Nasopharyngeal Swab     Status: None   Collection Time: 08/27/20  2:10 PM   Specimen: Nasopharyngeal Swab; Nasopharyngeal(NP) swabs in vial transport medium  Result Value Ref Range Status   SARS Coronavirus 2 by RT PCR NEGATIVE NEGATIVE Final    Comment: (NOTE) SARS-CoV-2 target nucleic acids are NOT DETECTED.  The SARS-CoV-2 RNA is generally detectable in upper respiratory specimens during the acute phase of infection. The lowest concentration of SARS-CoV-2 viral copies this assay can detect is 138 copies/mL. A negative result does not preclude SARS-Cov-2 infection and should not be used as the sole basis for treatment or other patient management decisions. A negative result may occur with  improper specimen collection/handling, submission of specimen other than nasopharyngeal swab, presence of viral mutation(s) within the areas targeted by this assay, and inadequate number of viral copies(<138 copies/mL). A negative result must be combined with clinical observations, patient history, and epidemiological information. The expected result is Negative.  Fact Sheet for Patients:  BloggerCourse.com  Fact Sheet for Healthcare Providers:  SeriousBroker.it  This test is no t yet approved or  cleared by the [place] FDA and  has been authorized for detection and/or diagnosis of SARS-CoV-2 by FDA under an Emergency Use Authorization (EUA). This EUA will remain  in effect (meaning this test can be used) for the duration of the COVID-19 declaration under Section 564(b)(1) of the Act, 21 U.S.C.section 360bbb-3(b)(1), unless the authorization is terminated  or revoked sooner.       Influenza A by PCR NEGATIVE NEGATIVE Final   Influenza B by PCR NEGATIVE NEGATIVE Final    Comment: (NOTE) The Xpert Xpress SARS-CoV-2/FLU/RSV plus assay is intended as an aid in the diagnosis of influenza from Nasopharyngeal swab specimens and should not be used as a sole basis for treatment. Nasal washings and aspirates are unacceptable for Xpert Xpress SARS-CoV-2/FLU/RSV testing.  Fact Sheet for Patients: BloggerCourse.com  Fact Sheet for Healthcare Providers: SeriousBroker.it  This test is not yet approved or cleared by the [place] FDA and has been authorized for detection and/or diagnosis of SARS-CoV-2 by FDA under an Emergency Use Authorization (EUA). This EUA will remain in effect (meaning this test can be used) for the duration of the COVID-19 declaration under Section 564(b)(1) of the Act, 21 U.S.C. section 360bbb-3(b)(1), unless the authorization is terminated or revoked.  Performed at [facility], [address], [place], [place] [address]      Time coordinating discharge: Over 30 minutes  SIGNED:   Charise Killian, MD  [facility] 09/02/2020, 12:10 PM Pager   If 7PM-7AM, please contact night-coverage

## 2020-09-02 NOTE — Plan of Care (Signed)
  Problem: Education: Goal: Knowledge of General Education information will improve Description: Including pain rating scale, medication(s)/side effects and non-pharmacologic comfort measures Outcome: Adequate for Discharge   Problem: Health Behavior/Discharge Planning: Goal: Ability to manage health-related needs will improve Outcome: Adequate for Discharge   Problem: Clinical Measurements: Goal: Ability to maintain clinical measurements within normal limits will improve Outcome: Adequate for Discharge Goal: Will remain free from infection Outcome: Adequate for Discharge Goal: Diagnostic test results will improve Outcome: Adequate for Discharge Goal: Respiratory complications will improve Outcome: Adequate for Discharge   Problem: Activity: Goal: Risk for activity intolerance will decrease Outcome: Adequate for Discharge   Problem: Nutrition: Goal: Adequate nutrition will be maintained Outcome: Adequate for Discharge   Problem: Coping: Goal: Level of anxiety will decrease Outcome: Adequate for Discharge   Problem: Elimination: Goal: Will not experience complications related to bowel motility Outcome: Adequate for Discharge Goal: Will not experience complications related to urinary retention Outcome: Adequate for Discharge   Problem: Pain Managment: Goal: General experience of comfort will improve Outcome: Adequate for Discharge   Problem: Skin Integrity: Goal: Risk for impaired skin integrity will decrease Outcome: Adequate for Discharge   Problem: Activity: Goal: Ability to tolerate increased activity will improve Outcome: Adequate for Discharge Goal: Will verbalize the importance of balancing activity with adequate rest periods Outcome: Adequate for Discharge   Problem: Respiratory: Goal: Ability to maintain a clear airway will improve Outcome: Adequate for Discharge Goal: Levels of oxygenation will improve Outcome: Adequate for Discharge Goal: Ability to  maintain adequate ventilation will improve Outcome: Adequate for Discharge

## 2020-09-02 NOTE — TOC Progression Note (Signed)
Transition of Care [facility]) - Progression Note    Patient Details  Name: ORVELL CAREAGA MRN: [id number] Date of Birth: [birth year]  Transition of Care [facility]) CM/SW Contact  Barrie Dunker, RN Phone Number: 09/02/2020, 3:55 PM  Clinical Narrative:   [facility] to transport the patient home due to 4 liters of Oxygen, he is next on the list, Nurse aware    Expected Discharge Plan: Home w Home Health Services Barriers to Discharge: Barriers Resolved  Expected Discharge Plan and Services Expected Discharge Plan: Home w Home Health Services   Discharge Planning Services: CM Consult     Expected Discharge Date: 09/02/20               DME Arranged: N/A         HH Arranged: NA           Social Determinants of Health (SDOH) Interventions    Readmission Risk Interventions No flowsheet data found.

## 2020-09-02 NOTE — TOC Progression Note (Signed)
Transition of Care [facility]) - Progression Note    Patient Details  Name: Darin Hopkins MRN: [id number] Date of Birth: [birth year]  Transition of Care [facility]) CM/SW Contact  Barrie Dunker, RN Phone Number: 09/02/2020, 2:43 PM  Clinical Narrative:   [facility] transport and spoke to [facility] to arrange [facility]oad to transport home, will be picked up after 4 PM and the driver will call 1A nurses desk when they arrive, the patient signed the waiver, sent via fax to [facility] transportation    Expected Discharge Plan: Home w Home Health Services Barriers to Discharge: Barriers Resolved  Expected Discharge Plan and Services Expected Discharge Plan: Home w Home Health Services   Discharge Planning Services: CM Consult     Expected Discharge Date: 09/02/20               DME Arranged: N/A         HH Arranged: NA           Social Determinants of Health (SDOH) Interventions    Readmission Risk Interventions No flowsheet data found.

## 2020-09-04 ENCOUNTER — Ambulatory Visit: Payer: Medicaid Other | Admitting: Family

## 2020-09-04 ENCOUNTER — Telehealth: Payer: Self-pay | Admitting: Family

## 2020-09-04 NOTE — Telephone Encounter (Signed)
Patient did not show for his [facility] appointment on 09/04/20. Will attempt to reschedule.  

## 2020-09-09 ENCOUNTER — Other Ambulatory Visit: Payer: Self-pay

## 2020-09-09 ENCOUNTER — Emergency Department: Payer: Medicaid Other

## 2020-09-09 ENCOUNTER — Emergency Department
Admission: EM | Admit: 2020-09-09 | Discharge: 2020-09-09 | Disposition: A | Discharge status: Other Acute Inpatient Hospital | Payer: Medicaid Other | Attending: Emergency Medicine | Admitting: Emergency Medicine

## 2020-09-09 ENCOUNTER — Inpatient Hospital Stay (HOSPITAL_COMMUNITY)
Admission: AD | Admit: 2020-09-09 | Discharge: 2020-09-17 | DRG: 100 | Disposition: A | Discharge status: Home Health | Payer: Medicaid Other | Source: Other Acute Inpatient Hospital | Attending: Family Medicine | Admitting: Family Medicine

## 2020-09-09 ENCOUNTER — Inpatient Hospital Stay (HOSPITAL_COMMUNITY): Payer: Medicaid Other

## 2020-09-09 DIAGNOSIS — J9622 Acute and chronic respiratory failure with hypercapnia: Secondary | ICD-10-CM | POA: Diagnosis present

## 2020-09-09 DIAGNOSIS — E43 Unspecified severe protein-calorie malnutrition: Secondary | ICD-10-CM | POA: Diagnosis present

## 2020-09-09 DIAGNOSIS — Z7951 Long term (current) use of inhaled steroids: Secondary | ICD-10-CM | POA: Insufficient documentation

## 2020-09-09 DIAGNOSIS — Z9114 Patient's other noncompliance with medication regimen: Secondary | ICD-10-CM

## 2020-09-09 DIAGNOSIS — G40901 Epilepsy, unspecified, not intractable, with status epilepticus: Secondary | ICD-10-CM | POA: Insufficient documentation

## 2020-09-09 DIAGNOSIS — Z9981 Dependence on supplemental oxygen: Secondary | ICD-10-CM

## 2020-09-09 DIAGNOSIS — J449 Chronic obstructive pulmonary disease, unspecified: Secondary | ICD-10-CM | POA: Diagnosis present

## 2020-09-09 DIAGNOSIS — I509 Heart failure, unspecified: Secondary | ICD-10-CM

## 2020-09-09 DIAGNOSIS — R5381 Other malaise: Secondary | ICD-10-CM | POA: Diagnosis present

## 2020-09-09 DIAGNOSIS — Z9119 Patient's noncompliance with other medical treatment and regimen: Secondary | ICD-10-CM

## 2020-09-09 DIAGNOSIS — Z79899 Other long term (current) drug therapy: Secondary | ICD-10-CM | POA: Diagnosis not present

## 2020-09-09 DIAGNOSIS — Z87898 Personal history of other specified conditions: Secondary | ICD-10-CM | POA: Diagnosis not present

## 2020-09-09 DIAGNOSIS — J9621 Acute and chronic respiratory failure with hypoxia: Secondary | ICD-10-CM | POA: Diagnosis present

## 2020-09-09 DIAGNOSIS — J9602 Acute respiratory failure with hypercapnia: Secondary | ICD-10-CM

## 2020-09-09 DIAGNOSIS — I69398 Other sequelae of cerebral infarction: Secondary | ICD-10-CM | POA: Diagnosis not present

## 2020-09-09 DIAGNOSIS — J9601 Acute respiratory failure with hypoxia: Secondary | ICD-10-CM | POA: Diagnosis not present

## 2020-09-09 DIAGNOSIS — E785 Hyperlipidemia, unspecified: Secondary | ICD-10-CM | POA: Diagnosis present

## 2020-09-09 DIAGNOSIS — G9340 Encephalopathy, unspecified: Secondary | ICD-10-CM | POA: Diagnosis present

## 2020-09-09 DIAGNOSIS — G40301 Generalized idiopathic epilepsy and epileptic syndromes, not intractable, with status epilepticus: Secondary | ICD-10-CM | POA: Diagnosis not present

## 2020-09-09 DIAGNOSIS — I5032 Chronic diastolic (congestive) heart failure: Secondary | ICD-10-CM | POA: Diagnosis present

## 2020-09-09 DIAGNOSIS — Y900 Blood alcohol level of less than 20 mg/100 ml: Secondary | ICD-10-CM | POA: Diagnosis not present

## 2020-09-09 DIAGNOSIS — F17210 Nicotine dependence, cigarettes, uncomplicated: Secondary | ICD-10-CM | POA: Diagnosis not present

## 2020-09-09 DIAGNOSIS — Z7982 Long term (current) use of aspirin: Secondary | ICD-10-CM | POA: Diagnosis not present

## 2020-09-09 DIAGNOSIS — D72829 Elevated white blood cell count, unspecified: Secondary | ICD-10-CM | POA: Diagnosis present

## 2020-09-09 DIAGNOSIS — I130 Hypertensive heart and chronic kidney disease with heart failure and stage 1 through stage 4 chronic kidney disease, or unspecified chronic kidney disease: Secondary | ICD-10-CM | POA: Diagnosis present

## 2020-09-09 DIAGNOSIS — I110 Hypertensive heart disease with heart failure: Secondary | ICD-10-CM | POA: Insufficient documentation

## 2020-09-09 DIAGNOSIS — Z681 Body mass index (BMI) 19 or less, adult: Secondary | ICD-10-CM | POA: Diagnosis not present

## 2020-09-09 DIAGNOSIS — Z20822 Contact with and (suspected) exposure to covid-19: Secondary | ICD-10-CM | POA: Diagnosis not present

## 2020-09-09 DIAGNOSIS — N179 Acute kidney failure, unspecified: Secondary | ICD-10-CM | POA: Diagnosis present

## 2020-09-09 DIAGNOSIS — N1831 Chronic kidney disease, stage 3a: Secondary | ICD-10-CM | POA: Diagnosis present

## 2020-09-09 DIAGNOSIS — J431 Panlobular emphysema: Secondary | ICD-10-CM | POA: Diagnosis not present

## 2020-09-09 DIAGNOSIS — K219 Gastro-esophageal reflux disease without esophagitis: Secondary | ICD-10-CM | POA: Diagnosis present

## 2020-09-09 DIAGNOSIS — E782 Mixed hyperlipidemia: Secondary | ICD-10-CM | POA: Diagnosis not present

## 2020-09-09 DIAGNOSIS — F1010 Alcohol abuse, uncomplicated: Secondary | ICD-10-CM | POA: Diagnosis present

## 2020-09-09 DIAGNOSIS — R569 Unspecified convulsions: Secondary | ICD-10-CM | POA: Diagnosis present

## 2020-09-09 DIAGNOSIS — I10 Essential (primary) hypertension: Secondary | ICD-10-CM | POA: Diagnosis not present

## 2020-09-09 DIAGNOSIS — E781 Pure hyperglyceridemia: Secondary | ICD-10-CM | POA: Diagnosis present

## 2020-09-09 DIAGNOSIS — G40909 Epilepsy, unspecified, not intractable, without status epilepticus: Principal | ICD-10-CM | POA: Diagnosis present

## 2020-09-09 DIAGNOSIS — I5033 Acute on chronic diastolic (congestive) heart failure: Secondary | ICD-10-CM | POA: Diagnosis not present

## 2020-09-09 HISTORY — DX: Unspecified convulsions: R56.9

## 2020-09-09 LAB — CBC WITH DIFFERENTIAL/PLATELET
Abs Immature Granulocytes: 0.03 10*3/uL (ref 0.00–0.07)
Basophils Absolute: 0 10*3/uL (ref 0.0–0.1)
Basophils Relative: 0 %
Eosinophils Absolute: 0 10*3/uL (ref 0.0–0.5)
Eosinophils Relative: 0 %
HCT: 37.4 % — ABNORMAL LOW (ref 39.0–52.0)
Hemoglobin: 11.4 g/dL — ABNORMAL LOW (ref 13.0–17.0)
Immature Granulocytes: 0 %
Lymphocytes Relative: 54 %
Lymphs Abs: 6.5 10*3/uL — ABNORMAL HIGH (ref 0.7–4.0)
MCH: 31.1 pg (ref 26.0–34.0)
MCHC: 30.5 g/dL (ref 30.0–36.0)
MCV: 102.2 fL — ABNORMAL HIGH (ref 80.0–100.0)
Monocytes Absolute: 0.5 10*3/uL (ref 0.1–1.0)
Monocytes Relative: 4 %
Neutro Abs: 5.1 10*3/uL (ref 1.7–7.7)
Neutrophils Relative %: 42 %
Platelets: 183 10*3/uL (ref 150–400)
RBC: 3.66 MIL/uL — ABNORMAL LOW (ref 4.22–5.81)
RDW: 12.5 % (ref 11.5–15.5)
Smear Review: NORMAL
WBC: 12.2 10*3/uL — ABNORMAL HIGH (ref 4.0–10.5)
nRBC: 0 % (ref 0.0–0.2)

## 2020-09-09 LAB — COMPREHENSIVE METABOLIC PANEL
ALT: 24 U/L (ref 0–44)
AST: 25 U/L (ref 15–41)
Albumin: 3.8 g/dL (ref 3.5–5.0)
Alkaline Phosphatase: 36 U/L — ABNORMAL LOW (ref 38–126)
Anion gap: 8 (ref 5–15)
BUN: 36 mg/dL — ABNORMAL HIGH (ref 6–20)
CO2: 30 mmol/L (ref 22–32)
Calcium: 8.7 mg/dL — ABNORMAL LOW (ref 8.9–10.3)
Chloride: 107 mmol/L (ref 98–111)
Creatinine, Ser: 1.38 mg/dL — ABNORMAL HIGH (ref 0.61–1.24)
GFR, Estimated: 59 mL/min — ABNORMAL LOW (ref >60)
Glucose, Bld: 84 mg/dL (ref 70–99)
Potassium: 4.2 mmol/L (ref 3.5–5.1)
Sodium: 145 mmol/L (ref 135–145)
Total Bilirubin: 0.6 mg/dL (ref 0.3–1.2)
Total Protein: 6.9 g/dL (ref 6.5–8.1)

## 2020-09-09 LAB — URINE DRUG SCREEN, QUALITATIVE (ARMC ONLY)
Amphetamines, Ur Screen: NOT DETECTED
Barbiturates, Ur Screen: NOT DETECTED
Benzodiazepine, Ur Scrn: POSITIVE — AB
Cannabinoid 50 Ng, Ur ~~LOC~~: NOT DETECTED
Cocaine Metabolite,Ur ~~LOC~~: NOT DETECTED
MDMA (Ecstasy)Ur Screen: NOT DETECTED
Methadone Scn, Ur: NOT DETECTED
Opiate, Ur Screen: NOT DETECTED
Phencyclidine (PCP) Ur S: NOT DETECTED
Tricyclic, Ur Screen: NOT DETECTED

## 2020-09-09 LAB — URINALYSIS, ROUTINE W REFLEX MICROSCOPIC
Bacteria, UA: NONE SEEN
Bilirubin Urine: NEGATIVE
Glucose, UA: NEGATIVE mg/dL
Ketones, ur: NEGATIVE mg/dL
Leukocytes,Ua: NEGATIVE
Nitrite: NEGATIVE
Protein, ur: 100 mg/dL — AB
Specific Gravity, Urine: 1.017 (ref 1.005–1.030)
Squamous Epithelial / HPF: NONE SEEN (ref 0–5)
pH: 5 (ref 5.0–8.0)

## 2020-09-09 LAB — BLOOD GAS, ARTERIAL
Acid-Base Excess: 1.5 mmol/L (ref 0.0–2.0)
Bicarbonate: 29.8 mmol/L — ABNORMAL HIGH (ref 20.0–28.0)
FIO2: 0.6
MECHVT: 450 mL
O2 Saturation: 99.8 %
PEEP: 5 cmH2O
Patient temperature: 37
RATE: 16 resp/min
pCO2 arterial: 65 mmHg — ABNORMAL HIGH (ref 32.0–48.0)
pH, Arterial: 7.27 — ABNORMAL LOW (ref 7.350–7.450)
pO2, Arterial: 232 mmHg — ABNORMAL HIGH (ref 83.0–108.0)

## 2020-09-09 LAB — RESP PANEL BY RT-PCR (FLU A&B, COVID) ARPGX2
Influenza A by PCR: NEGATIVE
Influenza B by PCR: NEGATIVE
SARS Coronavirus 2 by RT PCR: NEGATIVE

## 2020-09-09 LAB — CBG MONITORING, ED: Glucose-Capillary: 79 mg/dL (ref 70–99)

## 2020-09-09 LAB — MAGNESIUM: Magnesium: 2 mg/dL (ref 1.7–2.4)

## 2020-09-09 LAB — TSH: TSH: 2.166 u[IU]/mL (ref 0.350–4.500)

## 2020-09-09 LAB — ETHANOL: Alcohol, Ethyl (B): <10 mg/dL (ref <10)

## 2020-09-09 MED ORDER — PROPOFOL 1000 MG/100ML IV EMUL
5.0000 ug/kg/min | INTRAVENOUS | Status: DC
  Administered 2020-09-10: 25 ug/kg/min via INTRAVENOUS
  Filled 2020-09-09: qty 100

## 2020-09-09 MED ORDER — LEVETIRACETAM IN NACL 1000 MG/100ML IV SOLN
1000.0000 mg | Freq: Once | INTRAVENOUS | Status: AC
  Administered 2020-09-09: 1000 mg via INTRAVENOUS
  Filled 2020-09-09: qty 100

## 2020-09-09 MED ORDER — SUCCINYLCHOLINE CHLORIDE 20 MG/ML IJ SOLN
INTRAMUSCULAR | Status: AC | PRN
  Administered 2020-09-09: 100 mg via INTRAVENOUS

## 2020-09-09 MED ORDER — MIDAZOLAM BOLUS VIA INFUSION
1.0000 mg | INTRAVENOUS | Status: DC | PRN
  Filled 2020-09-09: qty 2

## 2020-09-09 MED ORDER — DOCUSATE SODIUM 100 MG PO CAPS: 100.0000 mg | ORAL_CAPSULE | Freq: Two times a day (BID) | ORAL | Status: DC | PRN

## 2020-09-09 MED ORDER — IPRATROPIUM-ALBUTEROL 0.5-2.5 (3) MG/3ML IN SOLN
3.0000 mL | Freq: Four times a day (QID) | RESPIRATORY_TRACT | Status: DC
  Administered 2020-09-10: 3 mL via RESPIRATORY_TRACT
  Filled 2020-09-09: qty 3

## 2020-09-09 MED ORDER — ETOMIDATE 2 MG/ML IV SOLN
INTRAVENOUS | Status: AC | PRN
  Administered 2020-09-09: 20 mg via INTRAVENOUS

## 2020-09-09 MED ORDER — PROPOFOL BOLUS VIA INFUSION
2.0000 mg/kg | INTRAVENOUS | Status: DC | PRN
  Filled 2020-09-09: qty 124

## 2020-09-09 MED ORDER — MIDAZOLAM HCL 5 MG/5ML IJ SOLN
INTRAMUSCULAR | Status: AC | PRN
  Administered 2020-09-09: 4 mg via INTRAVENOUS

## 2020-09-09 MED ORDER — THIAMINE HCL 100 MG/ML IJ SOLN
100.0000 mg | Freq: Once | INTRAMUSCULAR | Status: AC
  Administered 2020-09-09: 100 mg via INTRAVENOUS
  Filled 2020-09-09: qty 2

## 2020-09-09 MED ORDER — SODIUM CHLORIDE 0.9 % IV SOLN
750.0000 mg | Freq: Two times a day (BID) | INTRAVENOUS | Status: DC
  Administered 2020-09-09 – 2020-09-10 (×3): 750 mg via INTRAVENOUS
  Filled 2020-09-09 (×5): qty 7.5

## 2020-09-09 MED ORDER — FENTANYL CITRATE (PF) 100 MCG/2ML IJ SOLN
100.0000 ug | INTRAMUSCULAR | Status: DC | PRN
Start: 2020-09-09 — End: 2020-09-09

## 2020-09-09 MED ORDER — THIAMINE HCL 100 MG/ML IJ SOLN
500.0000 mg | Freq: Three times a day (TID) | INTRAVENOUS | Status: DC
  Filled 2020-09-09 (×3): qty 5

## 2020-09-09 MED ORDER — HEPARIN SODIUM (PORCINE) 5000 UNIT/ML IJ SOLN
5000.0000 [IU] | Freq: Three times a day (TID) | INTRAMUSCULAR | Status: DC
  Administered 2020-09-10 – 2020-09-17 (×24): 5000 [IU] via SUBCUTANEOUS
  Filled 2020-09-09 (×22): qty 1

## 2020-09-09 MED ORDER — POLYETHYLENE GLYCOL 3350 17 G PO PACK: 17.0000 g | PACK | Freq: Every day | ORAL | Status: DC | PRN

## 2020-09-09 MED ORDER — MIDAZOLAM BOLUS VIA INFUSION
0.2000 mg/kg | INTRAVENOUS | Status: DC | PRN
Start: 2020-09-09 — End: 2020-09-09
  Administered 2020-09-09 (×4): 12.4 mg via INTRAVENOUS
  Filled 2020-09-09: qty 13

## 2020-09-09 MED ORDER — MIDAZOLAM HCL 2 MG/2ML IJ SOLN: 2.0000 mg | INTRAMUSCULAR | Status: DC | PRN

## 2020-09-09 MED ORDER — SODIUM CHLORIDE 0.9 % IV SOLN
720.0000 mg | Freq: Once | INTRAVENOUS | Status: AC
  Administered 2020-09-09: 720 mg via INTRAVENOUS
  Filled 2020-09-09: qty 7.2

## 2020-09-09 MED ORDER — THIAMINE HCL 100 MG/ML IJ SOLN
100.0000 mg | Freq: Every day | INTRAMUSCULAR | Status: DC
  Administered 2020-09-10: 100 mg via INTRAVENOUS
  Filled 2020-09-09 (×2): qty 2

## 2020-09-09 MED ORDER — BUDESONIDE 0.25 MG/2ML IN SUSP
0.2500 mg | Freq: Two times a day (BID) | RESPIRATORY_TRACT | Status: DC
  Administered 2020-09-10 – 2020-09-17 (×15): 0.25 mg via RESPIRATORY_TRACT
  Filled 2020-09-09 (×17): qty 2

## 2020-09-09 MED ORDER — ORAL CARE MOUTH RINSE
15.0000 mL | OROMUCOSAL | Status: DC
  Administered 2020-09-09 – 2020-09-10 (×8): 15 mL via OROMUCOSAL

## 2020-09-09 MED ORDER — MIDAZOLAM 50MG/50ML (1MG/ML) PREMIX INFUSION: 10.0000 mg/h | INTRAVENOUS | Status: DC

## 2020-09-09 MED ORDER — ALBUTEROL SULFATE (2.5 MG/3ML) 0.083% IN NEBU
2.5000 mg | INHALATION_SOLUTION | RESPIRATORY_TRACT | Status: DC | PRN
  Administered 2020-09-09: 2.5 mg via RESPIRATORY_TRACT

## 2020-09-09 MED ORDER — SODIUM CHLORIDE 0.9 % IV SOLN
250.0000 mL | INTRAVENOUS | Status: DC
  Administered 2020-09-09: 250 mL via INTRAVENOUS

## 2020-09-09 MED ORDER — LORAZEPAM 2 MG/ML IJ SOLN
2.0000 mg | Freq: Once | INTRAMUSCULAR | Status: AC
  Administered 2020-09-09: 2 mg via INTRAVENOUS

## 2020-09-09 MED ORDER — FENTANYL CITRATE (PF) 100 MCG/2ML IJ SOLN: 100.0000 ug | INTRAMUSCULAR | Status: DC | PRN

## 2020-09-09 MED ORDER — MIDAZOLAM 50MG/50ML (1MG/ML) PREMIX INFUSION
0.5000 mg/h | INTRAVENOUS | Status: DC
  Administered 2020-09-09: 5 mg/h via INTRAVENOUS
  Filled 2020-09-09 (×3): qty 50

## 2020-09-09 MED ORDER — MIDAZOLAM HCL 2 MG/2ML IJ SOLN
4.0000 mg | Freq: Once | INTRAMUSCULAR | Status: AC
  Administered 2020-09-09: 4 mg via INTRAVENOUS

## 2020-09-09 MED ORDER — ALBUTEROL SULFATE (2.5 MG/3ML) 0.083% IN NEBU: 2.5000 mg | INHALATION_SOLUTION | Freq: Four times a day (QID) | RESPIRATORY_TRACT | Status: DC | PRN

## 2020-09-09 MED ORDER — DOCUSATE SODIUM 50 MG/5ML PO LIQD: 100.0000 mg | Freq: Two times a day (BID) | ORAL | Status: DC | PRN

## 2020-09-09 MED ORDER — LEVETIRACETAM IN NACL 1000 MG/100ML IV SOLN
1000.0000 mg | Freq: Once | INTRAVENOUS | Status: DC
  Filled 2020-09-09: qty 100

## 2020-09-09 MED ORDER — LEVETIRACETAM IN NACL 1500 MG/100ML IV SOLN: 1500.0000 mg | Freq: Once | INTRAVENOUS | Status: DC

## 2020-09-09 MED ORDER — SODIUM CHLORIDE 0.9 % IV SOLN
1.0000 mg | Freq: Every day | INTRAVENOUS | Status: DC
  Administered 2020-09-10: 1 mg via INTRAVENOUS
  Filled 2020-09-09 (×2): qty 0.2

## 2020-09-09 MED ORDER — MIDAZOLAM 50MG/50ML (1MG/ML) PREMIX INFUSION
10.0000 mg/h | INTRAVENOUS | Status: DC
  Administered 2020-09-09 – 2020-09-10 (×3): 10 mg/h via INTRAVENOUS
  Filled 2020-09-09 (×3): qty 50

## 2020-09-09 MED ORDER — SODIUM CHLORIDE 0.9 % IV SOLN
1.0000 mg | Freq: Once | INTRAVENOUS | Status: AC
  Administered 2020-09-09: 1 mg via INTRAVENOUS
  Filled 2020-09-09: qty 0.2

## 2020-09-09 MED ORDER — MIDAZOLAM 50MG/50ML (1MG/ML) PREMIX INFUSION: 0.0000 mg/h | INTRAVENOUS | Status: DC

## 2020-09-09 MED ORDER — SODIUM CHLORIDE 0.9 % IV SOLN
750.0000 mg | Freq: Two times a day (BID) | INTRAVENOUS | Status: DC
  Filled 2020-09-09 (×2): qty 7.5

## 2020-09-09 MED ORDER — NOREPINEPHRINE 4 MG/250ML-% IV SOLN
2.0000 ug/min | INTRAVENOUS | Status: DC
  Filled 2020-09-09: qty 250

## 2020-09-09 MED ORDER — PANTOPRAZOLE SODIUM 40 MG PO PACK
40.0000 mg | PACK | Freq: Every day | ORAL | Status: DC
  Administered 2020-09-09 – 2020-09-10 (×2): 40 mg
  Filled 2020-09-09 (×2): qty 20

## 2020-09-09 MED ORDER — SODIUM CHLORIDE 0.9 % IV SOLN
2000.0000 mg | Freq: Once | INTRAVENOUS | Status: DC
  Filled 2020-09-09: qty 20

## 2020-09-09 MED ORDER — MIDAZOLAM 50MG/50ML (1MG/ML) PREMIX INFUSION
15.0000 mg/h | INTRAVENOUS | Status: DC
  Administered 2020-09-09: 15 mg/h via INTRAVENOUS

## 2020-09-09 MED ORDER — SODIUM CHLORIDE 0.9 % IV SOLN: INTRAVENOUS | Status: DC | PRN

## 2020-09-09 MED ORDER — CHLORHEXIDINE GLUCONATE 0.12% ORAL RINSE (MEDLINE KIT)
15.0000 mL | Freq: Two times a day (BID) | OROMUCOSAL | Status: DC
  Administered 2020-09-09 – 2020-09-10 (×2): 15 mL via OROMUCOSAL

## 2020-09-09 MED ORDER — PROPOFOL 1000 MG/100ML IV EMUL
0.0000 ug/kg/min | INTRAVENOUS | Status: DC
  Administered 2020-09-09: 20 ug/kg/min via INTRAVENOUS
  Filled 2020-09-09: qty 100

## 2020-09-09 NOTE — ED Notes (Signed)
Spoke with Dr. Iver Nestle regarding bradycardia, Hr 49-51, advised to reduce versed to 10 mg/hr  from 15 mg/hr.  Adjusted per verbal order.

## 2020-09-09 NOTE — ED Notes (Signed)
[facility] at bedside 

## 2020-09-09 NOTE — Procedures (Signed)
Patient Name: Darin Hopkins  MRN: [id number]  Epilepsy Attending: Charlsie Quest  Referring Physician/Provider: Dr Brooke Dare Date: 09/09/2020 Duration: 21.26 mins  Patient history: 60yo M with multiple seizures. EEG to evaluate for seizures  Level of alertness:  Comatose/sedated  AEDs during EEG study: Versed, propofol, LEV  Technical aspects: This EEG study was done with scalp electrodes positioned according to the 10-20 International system of electrode placement. Electrical activity was acquired at a sampling rate of 500Hz  and reviewed with a high frequency filter of 70Hz  and a low frequency filter of 1Hz . EEG data were recorded continuously and digitally stored.   Description: EEG showed continuous generalized 3 to 6 Hz theta-delta slowing admixed with 15-18Hz  generalized, maximal frontocentral region beta activity. Hyperventilation and photic stimulation were not performed.     ABNORMALITY - Continuous slow, generalized  IMPRESSION: This study is suggestive of moderate diffuse encephalopathy, nonspecific etiology but likely related to sedation. No seizures or epileptiform discharges were seen throughout the recording.  Darin Hopkins 

## 2020-09-09 NOTE — Progress Notes (Signed)
Pt arrived via [facility]. Pt repsonds to pain/turning. Pt does open his eyes with pain, neurology paged as felt that pt is still seizing. Pts eyes are twitching. Neurology aware. Pt with brady cardia. [facility] decreased sedation due to this. Did not increase at this time. Pt closed eyes once turning was complete. Report given to oncoming nurse. CCM aware of pts arrival at this time.

## 2020-09-09 NOTE — ED Provider Notes (Signed)
-----------------------------------------   5:44 PM on 09/09/2020 -----------------------------------------  I took over care on this patient from Dr. Erma Heritage.  The patient was previously in status epilepticus requiring multiple medication infusions including propofol and Versed.  Visible seizure activity has stopped.  Dr. Iver Nestle from neurology has evaluated the patient.  EEG has been completed and the result is pending.  She agrees with the current management and recommends decreasing the propofol drip since the patient is borderline bradycardic.  Plan is still transfer to [facility] for ICU care and continuous EEG although the patient no longer appears to be in status.  At this time, the patient has a bed assignment and we are awaiting transport.  He is stable for transfer at this time.     Dionne Bucy, MD 09/09/20 1747

## 2020-09-09 NOTE — Consult Note (Addendum)
Neurology Consultation Reason for Consult: Status epilepticus Requesting Physician: Shaune Pollack  CC: Status epilepticus  History is obtained from: Chart review and ED provider  HPI: Darin Hopkins is a 60 y.o. male with a past medical history significant for seizures, ethanol use disorder, CKD stage III, hyperlipidemia, hypertension, heart failure, COPD, prior stroke, ongoing tobacco use, severe malnutrition.  He was recently hospitalized from 4/20 through 4/26 for shortness of breath felt to be COPD exacerbation and nonadherence to home oxygen.  Reportedly he was attempting to quit alcohol use as well.  Multiple attempts to reach family were made without success.  Per EMS report the patient was seizing for at least an hour prior to arrival.  On ED arrival he had rhythmic left gaze, astutely recognized his ongoing seizure activity by the ED provider, who intubated the patient and reach out to neurology for further assistance.  ROS:  Unable to obtain due to altered mental status.   Past Medical History:  Diagnosis Date  . COPD (chronic obstructive pulmonary disease) (HCC)   . Hypertension   Please see pertinents above.   No past surgical history on file.  Current Outpatient Medications  Medication Instructions  . amLODipine (NORVASC) 10 MG tablet 1 tablet, Oral, Daily  . aspirin 81 MG EC tablet Oral  . atorvastatin (LIPITOR) 20 MG tablet 1 tablet, Oral, Daily  . carvedilol (COREG) 25 MG tablet Oral  . doxazosin (CARDURA) 2 mg, Oral, Daily  . folic acid (FOLVITE) 1 mg, Oral, Daily  . furosemide (LASIX) 60 mg, Oral, Daily  . levETIRAcetam (KEPPRA) 500 mg, Oral, 2 times daily  . lisinopril (ZESTRIL) 5 mg, Oral, Daily  . omeprazole (PRILOSEC) 20 MG capsule 1 capsule, Oral, Daily  . PROAIR HFA 108 (90 Base) MCG/ACT inhaler Inhalation  . SPIRIVA HANDIHALER 18 MCG inhalation capsule 1 capsule, Daily  . thiamine 100 mg, Oral, Daily   No family history on file. Unable to assess  secondary to patient's mental status    Social History:  reports that he has been smoking cigarettes. He has been smoking about 2.00 packs per day. He has never used smokeless tobacco. He reports current alcohol use of about 3.0 standard drinks of alcohol per week. He reports previous drug use.  Exam: Current vital signs: BP 122/86 (BP Location: Right Arm)   Resp 20   Ht 5\' 8"  (1.727 m)   Wt 62 kg   SpO2 100%   BMI 20.78 kg/m  Vital signs in last 24 hours: Resp:  [20] 20 (05/03 1206) BP: (122)/(86) 122/86 (05/03 1206) SpO2:  [100 %] 100 % (05/03 1206) Weight:  [62 kg] 62 kg (05/03 1207)   Physical Exam  Constitutional: Appears chronically ill, emaciated Psych: Unresponsive  Eyes: No scleral injection HENT: ETT in place  MSK: no joint deformities.  Cardiovascular: Tachcardic, regular rate  Respiratory: Comfortable on vent  GI: Soft.  No distension. There is no tenderness.  Skin: Warm dry and intact visible skin  Neuro: Mental Status: Not responsive.  Cranial Nerves: II: No blink to threat. Pupils are equal, round, and unreactive to light initially (2 mm fixed)  III,IV, VI: Sustained left gaze deviation, intermittent rhythmic eye movement to the left.  V: Facial sensation is brisker blink on the right than the left.  VII: Facial movement Unable to assess secondary to ETT and mental status  VIII: No response to voice.  X/XI/XII: No cough/gag Motor/Sensory Tone is increased in the LUE with flexor posturing. No response to  noxious stim thoughout on initial examination.  On later examination withdrawal in the right upper extremity, triple flexion versus withdrawal in the bilateral legs, no movement of the left upper extremity to noxious stim He has seizure activity which initially was left gaze deviation, at times associated with rhythmic left arm jerking.  At times he had rhythmic left foot twitching or left leg jerking.  At times left arm and leg were concurrent with left  gaze, at times only gaze deviation was seen at times only left arm jerking was seen and at times only left leg jerking was seen. Plantars: Toes are mute bilaterally.    I have reviewed labs in epic and the results pertinent to this consultation are: Cr 1.38 (baseline 1.4 - 1.7) BUN 36 (badselin 60s)  WBC 12.2   I have reviewed the images obtained: HCT with stable large right MCA territory stroke and other scattered smaller strokes without acute process  Impression: Status epilepticus, potentially in the setting of alcohol withdrawal, versus medication noncompliance  Recommendations: - Keppra 60 mg / kg loading dose  - then 750 mg BID standing given renal function  Estimated Creatinine Clearance: 50.5 mL/min (A) (by C-G formula based on SCr of 1.38 mg/dL (H)).   (baseline Cr typically higher than today, so estimating likely < 50)   CrCl 30 to <50 mL/minute/1.73 m2: 250 to 750 mg every 12 hours. - Midazolam 0.2 mg/kg q16min up to 10 doses for seizure control, will additionally initiate drip, started at 5, increased to 10 then increased to 15  - Propofol drip for sedation also may be titrated for seizure control  - CK level, trend to peak, q12hr, may need aggressive IVF if uptrending overnight but will need caution given known heart failure - Ethanol level, UA, UDS   - Keppra level ordered but not drawn prior to keppra load, will attempt to add on - If patient's mental status does not improve, lumbar puncture should be considered - Head CT STAT when seizures controlled, stable from prior as above - Transfer to [facility] for LTM EEG, will obtain STAT EEG here if able prior to transfer - Neurology will follow in consultation  Brooke Dare MD-PhD [facility] [phone] [facility] coverage for [facility] is from 8 AM to 4 AM in-house and 4 PM to 8 PM by telephone/video. 8 PM to 8 AM emergent questions or overnight urgent questions should be addressed to Teleneurology  On-call or [facility] neurohospitalist; contact information can be found on AMION  Addendum On re-evaluation at 5 PM, patient noted to have midline gaze, reactive pupils though sluggish (3 mm to 2 mm), bilateral corneals intact, slight tremoring of jaw, trace but complex movement to noxious stimulation in all four extremities, no further left sided twitching or gaze deviation. Heart rate noted to be 50-51. On further review, vitals on day of discharge on last admission showed heart rates of mid 50s to low 60s.   EEG completed just before my evaluation matches exam in that seizures appear to be controlled for now, though patient is on considerable sedation and given severity of presentation weaning sedation with long-term EEG monitoring in place would still be helpful.   Propofol reduced from 20 to 10 and midazolam reduced from 15 to 10 to address bradycardia.    Total critical care time: 135 minutes given multiple re-evaluations of patient required to control status epilepticus    Critical care time was exclusive of separately billable procedures and treating other patients.  Critical care was necessary to treat or prevent imminent or life-threatening deterioration.   Critical care was time spent personally by me on the following activities: development of treatment plan with patient and/or surrogate as well as nursing, discussions with consultants/primary team, evaluation of patient's response to treatment, examination of patient, obtaining history from patient or surrogate, ordering and performing treatments and interventions, ordering and review of laboratory studies, ordering and review of radiographic studies, and re-evaluation of patient's condition as needed, as documented above.

## 2020-09-09 NOTE — Consult Note (Signed)
Neurology Consultation  Reason for Consult: Status epilepticus Referring Physician: Dr. Sandrea Hughs, PCCM  CC: Seizures, status epilepticus  History is obtained from: Chart review  HPI: Darin Hopkins is a 60 y.o. male past medical history of seizures, alcohol abuse, CKD 3, hyperlipidemia, hypertension, heart failure, COPD, prior stroke, severe malnutrition and ongoing tobacco abuse, was seen at [facility] when he presented with seizures.  Family has not been able to be contacted despite multiple attempts by the [facility] neuro hospitalist. Per the HPI from the neurological consultation at [facility], he was recently hospitalized 08/27/2020 to 09/02/2020 for shortness of breath, felt to be COPD exacerbation/CHF exacerbation and not adherent to home oxygen.  He was also reportedly attempting to quit alcohol.  According to the ED, EMS got the patient in seizing-at least for 1 hour prior to their arrival.  On ED arrival he had rhythmic left gaze which was identified as ongoing seizure activity by the ED provider and he was intubated for airway protection and neurology was consulted. He was loaded with Keppra 60 mg/kg loading dose and then 750 mg twice daily.  Versed boluses were given and currently on 10 mg/h of Versed.  Also on propofol 10/h. Toxicology screen ordered. Stat head CT with no acute changes.  Old right MCA distribution infarct and small left basal ganglia lacunar infarction and chronic microvascular ischemic changes seen which was stable from prior CT. Stat spot EEG done at [facility] was negative for ongoing seizure activity.  Patient transferred to [facility] for long-term EEG and further management. No family at bedside.   ROS: Unable to perform due to him being intubated and being on sedation  Past Medical History:  Diagnosis Date  . COPD (chronic obstructive pulmonary disease) (HCC)   . Hypertension     No family history on file. Unable to ascertain  due to his mentation and intubation  Social History:   reports that he has been smoking cigarettes. He has been smoking about 2.00 packs per day. He has never used smokeless tobacco. He reports current alcohol use of about 3.0 standard drinks of alcohol per week. He reports previous drug use.  Medications No current facility-administered medications for this encounter. Medication list from the [facility] consultation note include: Amlodipine 10 mg daily Aspirin 81 daily Atorvastatin 20 daily Coreg 25 mg tablet-unspecified frequency Doxazosin 2 mg oral daily Folate 1 mg daily Lasix 60 mg oral daily Keppra 500 twice daily Lisinopril 5 mg oral daily Omeprazole 20 oral daily ProAir Spiriva Thiamine 100 mg oral daily  Medicine reconciliation pending at the facility at this time  Exam: Current vital signs: BP (!) 142/97   Pulse (!) 57   Resp 14   SpO2 100%  Vital signs in last 24 hours: Temp:  [97.5 F (36.4 C)] 97.5 F (36.4 C) (05/03 1630) Pulse Rate:  [49-92] 57 (05/03 1900) Resp:  [14-29] 14 (05/03 1900) BP: (96-154)/(77-102) 142/97 (05/03 1900) SpO2:  [76 %-100 %] 100 % (05/03 1900) FiO2 (%):  [30 %-80 %] 30 % (05/03 1857) Weight:  [62 kg] 62 kg (05/03 1207) General: Sedated intubated-on propofol and Versed drips HEENT: Normocephalic atraumatic Lungs: Vented Cardiovascular: Bradycardic, regular Extremities with trace pedal edema bilaterally. GU: Extremely swollen testicles Abdomen nondistended nontender Neurological exam Sedated and intubated on propofol and Versed drip No spontaneous movements To noxious stimulation, opens eyes with some rhythmic eyelid fluttering. Gaze appears midline Pupils are sluggishly reactive To noxious stimulation also grimaces equally while  stimulated in all 4 extremities Not withdrawing strongly at all but appears symmetric in all fours Breathing over the ventilator Corneal reflexes present Cough and gag present  Labs I  have reviewed labs in epic and the results pertinent to this consultation are:  CBC    Component Value Date/Time   WBC 12.2 (H) 09/09/2020 1152   RBC 3.66 (L) 09/09/2020 1152   HGB 11.4 (L) 09/09/2020 1152   HGB 10.8 (L) 11/02/2013 0511   HCT 37.4 (L) 09/09/2020 1152   HCT 32.4 (L) 11/02/2013 0511   PLT 183 09/09/2020 1152   PLT 62 (L) 11/02/2013 0511   MCV 102.2 (H) 09/09/2020 1152   MCV 95 11/02/2013 0511   MCH 31.1 09/09/2020 1152   MCHC 30.5 09/09/2020 1152   RDW 12.5 09/09/2020 1152   RDW 12.9 11/02/2013 0511   LYMPHSABS 6.5 (H) 09/09/2020 1152   LYMPHSABS 0.6 (L) 11/02/2013 0511   MONOABS 0.5 09/09/2020 1152   MONOABS 0.1 (L) 11/02/2013 0511   EOSABS 0.0 09/09/2020 1152   EOSABS 0.0 11/02/2013 0511   BASOSABS 0.0 09/09/2020 1152   BASOSABS 0.0 11/02/2013 0511    CMP     Component Value Date/Time   NA 145 09/09/2020 1152   NA 130 (L) 11/02/2013 0511   K 4.2 09/09/2020 1152   K 3.9 11/02/2013 0511   CL 107 09/09/2020 1152   CL 100 11/02/2013 0511   CO2 30 09/09/2020 1152   CO2 18 (L) 11/02/2013 0511   GLUCOSE 84 09/09/2020 1152   GLUCOSE 102 (H) 11/02/2013 0511   BUN 36 (H) 09/09/2020 1152   BUN 39 (H) 11/02/2013 0511   CREATININE 1.38 (H) 09/09/2020 1152   CREATININE 2.64 (H) 11/02/2013 0511   CALCIUM 8.7 (L) 09/09/2020 1152   CALCIUM 7.7 (L) 11/02/2013 0511   PROT 6.9 09/09/2020 1152   PROT 8.4 (H) 08/04/2011 1233   ALBUMIN 3.8 09/09/2020 1152   ALBUMIN 3.9 08/04/2011 1233   AST 25 09/09/2020 1152   AST 97 (H) 08/04/2011 1233   ALT 24 09/09/2020 1152   ALT 45 08/04/2011 1233   ALKPHOS 36 (L) 09/09/2020 1152   ALKPHOS 36 (L) 08/04/2011 1233   BILITOT 0.6 09/09/2020 1152   BILITOT 0.4 08/04/2011 1233   GFRNONAA 59 (L) 09/09/2020 1152   GFRNONAA 27 (L) 11/02/2013 0511   GFRAA 31 (L) 11/02/2013 0511     Imaging I have reviewed the images obtained: Noncontrast head CT with old right MCA infarct, no new changes.  Chronic small vessel disease and  chronic left basal ganglia lacunar infarction.  Assessment:  60 year old man with past medical history of prior strokes, alcohol abuse, tobacco abuse, seizures, CKD 3, hypertension, hyperlipidemia, COPD, heart failure, severe malnutrition, presenting with active seizures to [facility]-for at least 1 hour prior to presentation, emergently intubated for ongoing status epilepticus, loaded with Keppra and started on Versed and propofol drips with spot EEG showing cessation of seizures. Clinically did appear to have ongoing fluttering of eyelids and gaze deviation to the left en route to the hospital. On my examination, had no gaze deviation at this time. Will hook up to LTM and modulate antiepileptics as guided by the examination and EEG.  Impression: Status epilepticus Seizure disorder with breakthrough seizures Unclear compliance to medications Possible alcohol withdrawal Tobacco abuse Alcohol abuse CKD stage III Hypertension Heart failure COPD   Recommendations:  Continue Keppra 750 twice daily  Continue Versed drip at 10 mg an hour and propofol at  10 mcg/kg/min  Seizure precautions  LTM EEG-technologist has been informed-we will get luminary read from the epileptologist as soon as readings are available.  Management of other medical comorbidities per primary team  Keppra level was added onto prior labs-will need follow-up.  This is noted for acute management but rather to know if he was compliant-that is why has been added to the blood draw from before the Keppra load was given.  -- Milon Dikes, MD Neurologist [facility] Pager: [phone]    CRITICAL CARE ATTESTATION Performed by: Milon Dikes, MD Total critical care time: 40 minutes Critical care time was exclusive of separately billable procedures and treating other patients and/or supervising APPs/Residents/Students Critical care was necessary to treat or prevent imminent or  life-threatening deterioration due to status epilepticus This patient is critically ill and at significant risk for neurological worsening and/or death and care requires constant monitoring. Critical care was time spent personally by me on the following activities: development of treatment plan with patient and/or surrogate as well as nursing, discussions with consultants, evaluation of patient's response to treatment, examination of patient, obtaining history from patient or surrogate, ordering and performing treatments and interventions, ordering and review of laboratory studies, ordering and review of radiographic studies, pulse oximetry, re-evaluation of patient's condition, participation in multidisciplinary rounds and medical decision making of high complexity in the care of this patient.  THE FOLLOWING WERE PRESENT ON ADMISSION: Status epilepticus, ventilator dependent respiratory failure, known systolic heart failure, CKD 3

## 2020-09-09 NOTE — ED Provider Notes (Signed)
[facility] Emergency Department Provider Note  ____________________________________________   Event Date/Time   First MD Initiated Contact with Patient 09/09/20 1208     (approximate)  I have reviewed the triage vital signs and the nursing notes.   HISTORY  Chief Complaint Seizures    HPI Darin Hopkins is a 60 y.o. male  With h/o seizures, HTN, COPD here with seizure.  History limited due to patient's active seizing on arrival.  Patient reportedly began seizing approximately 1 hour or so prior to EMS arrival.  On arrival, patient was actively seizing.  Is given a total of 2.5 mg of Versed x3 prior to arrival.  He remained seizing on arrival here and was given an additional dose of Ativan 2 mg.  Patient has a known history of seizure disorder.  Family believes that he has been taking his medications as prescribed.  He has history of alcohol use as well, unknown if he recently stopped or decreased drinking.  He was just recently admitted in the hospital for COPD earlier last month.   Level 5 caveat invoked as remainder of history, ROS, and physical exam limited due to patient's status.     Past Medical History:  Diagnosis Date  . COPD (chronic obstructive pulmonary disease) (HCC)   . Hypertension     Patient Active Problem List   Diagnosis Date Noted  . COPD exacerbation (HCC) 08/27/2020  . Protein-calorie malnutrition, severe 08/21/2020  . Acute respiratory failure with hypoxia (HCC)   . Acute decompensated heart failure (HCC) 08/18/2020  . Acute exacerbation of CHF (congestive heart failure) (HCC) 08/17/2020  . Essential hypertension 08/17/2020  . History of seizure 08/17/2020  . COPD (chronic obstructive pulmonary disease) (HCC) 08/17/2020  . Hyperlipidemia 08/17/2020  . GERD (gastroesophageal reflux disease) 08/17/2020  . Alcohol abuse 08/17/2020    No past surgical history on file.  Prior to Admission medications   Medication Sig Start  Date End Date Taking? Authorizing Provider  amLODipine (NORVASC) 10 MG tablet Take 1 tablet by mouth daily. 07/28/20   [provider]  aspirin 81 MG EC tablet Take by mouth.    [provider]  atorvastatin (LIPITOR) 20 MG tablet Take 1 tablet by mouth daily. 07/28/20   [provider]  carvedilol (COREG) 25 MG tablet Take by mouth. 12/12/09   [provider]  doxazosin (CARDURA) 2 MG tablet Take 2 mg by mouth daily. 07/28/20   [provider]  folic acid (FOLVITE) 1 MG tablet Take 1 mg by mouth daily. 04/17/20   [provider]  furosemide (LASIX) 20 MG tablet Take 3 tablets (60 mg total) by mouth daily. 08/26/20   Marrion Coy, MD  levETIRAcetam (KEPPRA) 500 MG tablet Take 500 mg by mouth 2 (two) times daily. 07/28/20   [provider]  lisinopril (ZESTRIL) 5 MG tablet Take 5 mg by mouth daily. 07/28/20   [provider]  omeprazole (PRILOSEC) 20 MG capsule Take 1 capsule by mouth daily. 07/28/20   [provider]  PROAIR HFA 108 (90 Base) MCG/ACT inhaler Inhale into the lungs. 07/28/20   [provider]  SPIRIVA HANDIHALER 18 MCG inhalation capsule 1 capsule daily. 07/28/20   [provider]  thiamine 100 MG tablet Take 1 tablet (100 mg total) by mouth daily. 08/26/20   Marrion Coy, MD    Allergies Patient has no known allergies.  No family history on file.  Social History Social History   Tobacco Use  .  Smoking status: Current Every Day Smoker    Packs/day: 2.00    Types: Cigarettes  . Smokeless tobacco: Never Used  Vaping Use  . Vaping Use: Never used  Substance Use Topics  . Alcohol use: Yes    Alcohol/week: 3.0 standard drinks    Types: 3 Cans of beer per week    Comment: daily  . Drug use: Not Currently    Review of Systems  Review of Systems  Unable to perform ROS: Patient unresponsive     ____________________________________________  PHYSICAL EXAM:      VITAL SIGNS: ED  Triage Vitals  Enc Vitals Group     BP 09/09/20 1206 122/86     Pulse --      Resp 09/09/20 1206 20     Temp --      Temp src --      SpO2 09/09/20 1206 100 %     Weight 09/09/20 1207 136 lb 11 oz (62 kg)     Height 09/09/20 1207 5\' 8"  (1.727 m)     Head Circumference --      Peak Flow --      Pain Score 09/09/20 1207 0     Pain Loc --      Pain Edu? --      Excl. in GC? --      Physical Exam Vitals reviewed.  Constitutional:      Comments: Actively seizing, unresponsive  HENT:     Head:     Comments: No obvious trauma    Mouth/Throat:     Mouth: Mucous membranes are dry.  Eyes:     Comments: Eyes deviated to the left with rhythmic nystagmus.  Pupils responsive bilaterally.  Cardiovascular:     Rate and Rhythm: Tachycardia present.  Pulmonary:     Effort: Tachypnea present.     Comments: Sonorous respirations with diminished aeration bilaterally. Neurological:     Comments: Left gaze deviation, head turned to left. Diffuse increased tone with intermittent high frequency seizure on left. Unresponsive.        ____________________________________________   LABS (all labs ordered are listed, but only abnormal results are displayed)  Labs Reviewed  CBC WITH DIFFERENTIAL/PLATELET - Abnormal; Notable for the following components:      Result Value   WBC 12.2 (*)    RBC 3.66 (*)    Hemoglobin 11.4 (*)    HCT 37.4 (*)    MCV 102.2 (*)    Lymphs Abs 6.5 (*)    All other components within normal limits  COMPREHENSIVE METABOLIC PANEL - Abnormal; Notable for the following components:   BUN 36 (*)    Creatinine, Ser 1.38 (*)    Calcium 8.7 (*)    Alkaline Phosphatase 36 (*)    GFR, Estimated 59 (*)    All other components within normal limits  BLOOD GAS, ARTERIAL - Abnormal; Notable for the following components:   pH, Arterial 7.27 (*)    pCO2 arterial 65 (*)    pO2, Arterial 232 (*)    Bicarbonate 29.8 (*)    All other components within normal limits   URINALYSIS, ROUTINE W REFLEX MICROSCOPIC - Abnormal; Notable for the following components:   Color, Urine YELLOW (*)    APPearance CLEAR (*)    Hgb urine dipstick MODERATE (*)    Protein, ur 100 (*)    All other components within normal limits  URINE DRUG SCREEN, QUALITATIVE ([facility] ONLY) - Abnormal; Notable for the following components:  Benzodiazepine, Ur Scrn POSITIVE (*)    All other components within normal limits  RESP PANEL BY RT-PCR (FLU A&B, COVID) ARPGX2  CULTURE, BLOOD (ROUTINE X 2)  CULTURE, BLOOD (ROUTINE X 2)  MAGNESIUM  TSH  ETHANOL  LEVETIRACETAM LEVEL  TRIGLYCERIDES  CBG MONITORING, ED    ____________________________________________  EKG: Sinus bradycardia, VR 58. PR 163, QRS 89, QTc 459. Anteroseptal infarct. No acute St elevations or depressions. ________________________________________  RADIOLOGY All imaging, including plain films, CT scans, and ultrasounds, independently reviewed by me, and interpretations confirmed via formal radiology reads.  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 Family states pt seized approx 1 hour prior to EMS. EMS reports pt seizing on arrival. EXAM: CT HEAD WITHOUT CONTRAST TECHNIQUE: Contiguous axial images were obtained from the base of the skull through the vertex without intravenous contrast. COMPARISON:  08/27/2020 FINDINGS: Brain: No evidence of acute infarction, hemorrhage, hydrocephalus, extra-axial collection or mass lesion/mass effect. Encephalomalacia walls lateral right frontal lobe and right basal and external capsule/insular ribbon consistent with an old infarct, stable from the prior head CT. Mild ex vacuo dilation of the right lateral ventricle. Small old lacunar infarct in the left basal ganglia. Additional areas of  patchy white matter hypoattenuation also noted in stable consistent moderate chronic microvascular ischemic change. Vascular: No hyperdense vessel or unexpected calcification. Skull: Normal. Negative for fracture or focal lesion. Sinuses/Orbits: Globes and orbits are unremarkable. Mild ethmoid and minor inferior frontal maxillary sinus mucosal thickening. Other: None. IMPRESSION: 1. No acute intracranial abnormalities. 2. 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 Electronically Signed   By: Amie Portlandavid  Ormond M.D.   On: 09/09/2020 14:45   DG Chest Portable 1 View  Result Date: 09/09/2020 CLINICAL DATA:  Seizures. EXAM: PORTABLE CHEST 1 VIEW COMPARISON:  08/27/2020. FINDINGS: Endotracheal to and NG tube noted good anatomic position. Heart size normal. No focal infiltrate. No pleural effusion. Costophrenic angles incompletely imaged. No pneumothorax. Carotid vascular calcification. IMPRESSION: 1.  Endotracheal tube and NG tube in stable position. 2.  No acute cardiopulmonary disease. 3.  Carotid vascular disease. Electronically Signed   By: Maisie Fushomas  Register   On: 09/09/2020 12:52    ____________________________________________  PROCEDURES   Procedure(s) performed (including Critical Care):  .Critical Care Performed by: Shaune PollackIsaacs, Neysha Criado, MD Authorized by: Shaune PollackIsaacs, Ivone Licht, MD   Critical care provider statement:    Critical care time (minutes):  75   Critical care time was exclusive of:  Separately billable procedures and treating other patients and teaching time   Critical care was necessary to treat or prevent imminent or life-threatening deterioration of the following conditions:  Circulatory failure, cardiac failure, respiratory failure and CNS failure or compromise   Critical care was time spent personally by me on the following activities:  Development of treatment plan with patient or surrogate,  discussions with consultants, evaluation of patient's response to treatment, examination of patient, obtaining history from patient or surrogate, ordering and performing treatments and interventions, ordering and review of laboratory studies, ordering and review of radiographic studies, pulse oximetry, re-evaluation of patient's condition and review of old charts   I assumed direction of critical care for this patient from another provider in my specialty: no  Procedure Name: Intubation Date/Time: 09/09/2020 5:31 PM Performed by: Shaune Pollack, MD Pre-anesthesia Checklist: Patient identified, Patient being monitored, Emergency Drugs available, Timeout performed and Suction available Oxygen Delivery Method: Non-rebreather mask Preoxygenation: Pre-oxygenation with 100% oxygen Induction Type: Rapid sequence Ventilation: Mask ventilation without difficulty Laryngoscope Size: Glidescope and 4 Grade View: Grade I Tube size: 7.5 mm Airway Equipment and Method: Rigid stylet and Video-laryngoscopy Placement Confirmation: ETT inserted through vocal cords under direct vision,  CO2 detector and Breath sounds checked- equal and bilateral Secured at: 26 cm Tube secured with: ETT holder Dental Injury: Teeth and Oropharynx as per pre-operative assessment  Difficulty Due To: Difficulty was unanticipated Future Recommendations: Recommend- induction with short-acting agent, and alternative techniques readily available    NG placement  Date/Time: 09/09/2020 5:31 PM Performed by: Shaune Pollack, MD Authorized by: Shaune Pollack, MD  Consent: The procedure was performed in an emergent situation. Preparation: Patient was prepped and draped in the usual sterile fashion. Local anesthesia used: no  Anesthesia: Local anesthesia used: no Patient tolerance: patient tolerated the procedure well with no immediate complications Comments: NG tube placed by myself due to difficult placement by nursing. Placement  confirmed via XR.     ____________________________________________  INITIAL IMPRESSION / MDM / ASSESSMENT AND PLAN / ED COURSE  As part of my medical decision making, I reviewed the following data within the electronic MEDICAL RECORD NUMBER Nursing notes reviewed and incorporated, Old chart reviewed, Notes from prior ED visits, and [place] Controlled Substance Database       *Darin Hopkins was evaluated in Emergency Department on 09/09/2020 for the symptoms described in the history of present illness. He was evaluated in the context of the global COVID-19 pandemic, which necessitated consideration that the patient might be at risk for infection with the SARS-CoV-2 virus that causes COVID-19. Institutional protocols and algorithms that pertain to the evaluation of patients at risk for COVID-19 are in a state of rapid change based on information released by regulatory bodies including the CDC and federal and state organizations. These policies and algorithms were followed during the patient's care in the ED.  Some ED evaluations and interventions may be delayed as a result of limited staffing during the pandemic.*     Medical Decision Making: 60 year old male here with status epilepticus.  Patient intubated on arrival due to persistent seizure-like activity.  Patient unable to break seizure despite multiple doses of Versed and Ativan.  He was started on propofol and Versed drip and loaded with 60/kg of Keppra.  Neurology consulted on arrival and immediately at bedside, appreciate their recommendations and management of his status.  Current plan is to admit to the ICU at [facility] for continuous EEG, although following initiation and titration of drip, patient has had resolution of his seizure activity.  He remains minimally responsive.  Neurology will perform EEG here, and if patient no longer seizing could consider admission here with consultation as needed.  [name] updated via telephone.   Otherwise, his lab work is actually overall unremarkable.  No apparent electrolyte abnormalities.  He does have a history of alcohol use and it is unclear whether he recently decrease this although per prior notes from admission, he was planning to decrease his drinking.  Alcohol level here is negative.  Seizure activity improving after keppra, versed, propofol. Admit to ICU. Neurology will continue following to determine if pt can be admitted here or continues to require continuous EEG. ____________________________________________  FINAL CLINICAL IMPRESSION(S) / ED DIAGNOSES  Final diagnoses:  Status epilepticus (HCC)  Acute respiratory failure with hypoxia (HCC)     MEDICATIONS GIVEN DURING THIS VISIT:  Medications  fentaNYL (SUBLIMAZE) injection 100 mcg (has no administration in time range)  fentaNYL (SUBLIMAZE) injection 100 mcg (has no administration in time range)  propofol (DIPRIVAN) 1000 MG/100ML infusion (10 mcg/kg/min  62 kg Intravenous Rate/Dose Change 09/09/20 1709)  fentaNYL (SUBLIMAZE) injection 100 mcg (has no administration in time range)  fentaNYL (SUBLIMAZE) injection 100 mcg (has no administration in time range)  midazolam (VERSED) bolus via infusion 12.4 mg (12.4 mg Intravenous Bolus from Bag 09/09/20 1323)  0.9 %  sodium chloride infusion (250 mLs Intravenous New Bag/Given 09/09/20 1448)  norepinephrine (LEVOPHED) 4mg  in premix infusion (0 mcg/min Intravenous Hold 09/09/20 1450)  propofol (DIPRIVAN) bolus via infusion 124 mg (has no administration in time range)  thiamine 500mg  in normal saline (82ml) IVPB (has no administration in time range)  midazolam (VERSED) 50 mg/50 mL (1 mg/mL) premix infusion (10 mg/hr Intravenous Rate/Dose Change 09/09/20 1720)  levETIRAcetam (KEPPRA) 750 mg in sodium chloride 0.9 % 100 mL IVPB (has no administration in time range)  folic acid 1 mg in sodium chloride 0.9 % 50 mL IVPB (0 mg Intravenous Stopped 09/09/20 1530)  thiamine (B-1)  injection 100 mg (100 mg Intravenous Given 09/09/20 1440)  LORazepam (ATIVAN) injection 2 mg (2 mg Intravenous Given 09/09/20 1159)  midazolam (VERSED) injection 4 mg (4 mg Intravenous Given 09/09/20 1216)  levETIRAcetam (KEPPRA) IVPB 1000 mg/100 mL premix (0 mg Intravenous Stopped 09/09/20 1335)  levETIRAcetam (KEPPRA) 720 mg in sodium chloride 0.9 % 100 mL IVPB (0 mg Intravenous Stopped 09/09/20 1303)  levETIRAcetam (KEPPRA) IVPB 1000 mg/100 mL premix (0 mg Intravenous Stopped 09/09/20 1321)  levETIRAcetam (KEPPRA) IVPB 1000 mg/100 mL premix (0 mg Intravenous Stopped 09/09/20 1530)  etomidate (AMIDATE) injection (20 mg Intravenous Given 09/09/20 1202)  succinylcholine (ANECTINE) injection (100 mg Intravenous Given 09/09/20 1203)  midazolam (VERSED) 5 MG/5ML injection (4 mg Intravenous Given 09/09/20 1205)     ED Discharge Orders    None       Note:  This document was prepared using Dragon voice recognition software and may include unintentional dictation errors.   11/09/20, MD 09/09/20 [phone]

## 2020-09-09 NOTE — H&P (Addendum)
NAME:  Darin Hopkins, MRN:  [id number], DOB:  Dec 07, 1960, LOS: 0 ADMISSION DATE:  09/09/2020, CONSULTATION DATE:  09/09/2020 REFERRING MD:  [facility], CHIEF COMPLAINT:  Status epilepticus   History of Present Illness:  60 year old male with PMHx significant for HTN, HLD, HFpEF (EF 60-65% 08/2020), R MCA CVA, seizures (on Keppra, reportedly nonadherent with medications), EtOH abuse, COPD (on home O2, noncompliant), CKD stage III who presented as a transfer from [name] for management of status epilepticus presumed 2/2 EtOH versus medication nonadherence. Of note, patient was recently discharged after being hospitalized 4/20-4/26 for presumed AECOPD.  Patient initially presented to [facility] ED via EMS 5/3 with ~1 hour of seizure activity at home prior to EMS contact; per EMS continued to seize en route to ED and continued for several hours after presentation. On arrival, patient was intubated as he was in presumed status epilepticus with AMS and unable to protect his airway. Per chart review (Neurology - Dr. Iver Nestle), at the time of presentation patient had a rhythmic leftward gaze and L arm jerking/twitching. Around 1700 5/3, neurologic exam was reportedly improved with increased responsiveness; however, patient was noted to be bradycardic to 40s and sedation was decreased. CT Head was negative for acute process and showed stable old R MCA territory infarct. STAT EEG obtained at [facility] demonstrated moderate diffuse encephalopathy but no epileptiform discharges.  Patient was transferred to [facility] for LTM EEG and further management of status epilepticus.  Pertinent  Medical History  HTN, HLD, HFpEF (EF 60-65% 08/2020), R MCA CVA, seizures (on Keppra), EtOH abuse, COPD, CKD stage III  Significant Hospital Events: Including procedures, antibiotic start and stop dates in addition to other pertinent events   . 5/3 Transferred from [facility] for status epilepticus, need for LTM EEG and higher level of care. Keppra load, sedated on  Prop/Versed. Focal L-sided ?seizure activity earlier in day, improved. Neuro/Epilepsy consult.  Interim History / Subjective:    Objective   Blood pressure (!) 142/97, pulse (!) 57, resp. rate 14, SpO2 100 %.    Vent Mode: PRVC FiO2 (%):  [30 %-80 %] 30 % Set Rate:  [16 bmp] 16 bmp Vt Set:  [450 mL-550 mL] 550 mL PEEP:  [5 cmH20] 5 cmH20 Plateau Pressure:  [26 cmH20] 26 cmH20  No intake or output data in the 24 hours ending 09/09/20 2003 There were no vitals filed for this visit.  Physical Examination: General: Chronically ill-appearing middle-aged man in NAD. HEENT: [place]/AT, anicteric sclera, pupils equal 90mm, sluggish bilaterally, moist mucous membranes. Facial muscle wasting noted. Neuro: Sedated. Responds to noxious stimuli. and Withdraws to pain in 4 extremities. Not following commands. +Corneal, no cough or gag on my exam with suctioning. CV: Bradycardic to high 40s, no m/g/r. PULM: Breathing even and unlabored on vent (PEEP 5, FiO2 50%). Lung fields diminished at bilateral bases. GI: Soft, nontender, nondistended. Normoactive bowel sounds. Extremities: No LE edema noted. Skin: Warm/dry, no significant rashes. Ecchymosis noted to R forearm.  Labs/imaging that I have personally reviewed: (right click and "Reselect all SmartList Selections" daily)  WBC 12.2 (9.5 7 days prior) H&H stable 11.4/37.4 Plt 183  Na 145, K 4.2, Bicarb 30, BUN 36, Cr 1.38 (baseline), Mg 2.0  EtOH level < 10 TSH 2.166  UA with moderate Hgb, trace protein, otherwise unremarkable UDS + benzodiazepines  CT Head demonstrating NAICA, old R MCA territory infarct CXR unremarkable  STAT EEG: study is suggestive of moderate diffuse encephalopathy, nonspecific etiology but likely related to sedation. No seizures  or epileptiform discharges were seen throughout the recording   Resolved Hospital Problem list     Assessment & Plan:  Seizure with concern for status epilepticus in the setting of EtOH use  versus medication nonadherence History of R MCA territory stroke Presented to [facility] ED via EMS for seizure activity x at least 1 hour prior to admission. Per EMS report, continued to seize en route to [facility] and for several hours after arrival with primarily L-sided focus. Neurologic exam slightly improved since presentation. Neuro/Epilepsy services consulted. Spot EEG at [facility] did not demonstrate epileptiform activity. CT Head negative for acute abnormalities, demonstrated old R MCA territory stroke. Transferred to [facility] for LTM EEG, higher level of care. - Neurology and Epilepsy teams consulted, appreciate recs - LTM EEG - Continue AEDs, f/u Keppra level (added on to labs pre-load) - Sedation with Propofol, Versed - Neuroprotective measures: HOB > 30 degrees, normoglycemia, normothermia, electrolytes WNL  Acute hypercapnic respiratory failure in the setting of seizure/status epilepticus and AMS COPD - Continue full vent support (6-8cc/kg IBW) - Wean FiO2 for O2 sat > 88-92% - Daily WUA/SBT (pending sedation weaning, d/w Neuro) - VAP bundle - Pulmonary hygiene - PAD protocol for sedation: Propofol and Versed for goal RASS -2 to -3  - Continue bronchodilators  HFpEF Hypertension History of heart failure with Echo 08/18/2020 demonstrating LVEF 60-65%, mild LVH. Home regimen includes Coreg, Lisinopril, ASA, Lasix. - Hold home BB/antihypertensive medications in the setting of bradycardia with sedation - Diuresis needs to be assessed on a daily basis  CKD stage III - Trend BMP - Replete electrolytes as indicated - Monitor I&Os - Avoid nephrotoxic agents as able  EtOH abuse EtOH level < 10 on admission, reportedly trying to stop drinking. UDS +benzodiazepines. - Monitor for additional signs/symptoms og withdrawal - Continue PAD protocol - Continue thiamine/folic acid - Encourage cessation when able to participate in care  Best practice (right click and "Reselect all SmartList Selections"  daily)  Diet:  NPO Pain/Anxiety/Delirium protocol (if indicated): Yes, RASS score -2 to -3 VAP protocol (if indicated): Yes DVT prophylaxis: Subcutaneous Heparin GI prophylaxis: PPI Glucose control:  SSI No Central venous access:  N/A Arterial line:  N/A Foley:  Yes, and it is still needed Mobility:  bed rest  PT consulted: N/A Last date of multidisciplinary goals of care discussion [5/3] Code Status:  full code Disposition: ICU  Labs:   CBC: Recent Labs  Lab 09/09/20 1152  WBC 12.2*  NEUTROABS 5.1  HGB 11.4*  HCT 37.4*  MCV 102.2*  PLT 183   Basic Metabolic Panel: Recent Labs  Lab 09/09/20 1152  NA 145  K 4.2  CL 107  CO2 30  GLUCOSE 84  BUN 36*  CREATININE 1.38*  CALCIUM 8.7*  MG 2.0   GFR: Estimated Creatinine Clearance: 50.5 mL/min (A) (by C-G formula based on SCr of 1.38 mg/dL (H)). Recent Labs  Lab 09/09/20 1152  WBC 12.2*    Liver Function Tests: Recent Labs  Lab 09/09/20 1152  AST 25  ALT 24  ALKPHOS 36*  BILITOT 0.6  PROT 6.9  ALBUMIN 3.8   No results for input(s): LIPASE, AMYLASE in the last 168 hours. No results for input(s): AMMONIA in the last 168 hours.  ABG:    Component Value Date/Time   PHART 7.27 (L) 09/09/2020 1211   PCO2ART 65 (H) 09/09/2020 1211   PO2ART 232 (H) 09/09/2020 1211   HCO3 29.8 (H) 09/09/2020 1211   O2SAT 99.8 09/09/2020 1211  Coagulation Profile: No results for input(s): INR, PROTIME in the last 168 hours.  Cardiac Enzymes: No results for input(s): CKTOTAL, CKMB, CKMBINDEX, TROPONINI in the last 168 hours.  HbA1C: No results found for: HGBA1C  CBG: Recent Labs  Lab 09/09/20 1202  GLUCAP 79   Review of Systems:   Patient is encephalopathic and/or intubated. Therefore history has been obtained from chart review.   Past Medical History:  He,  has a past medical history of COPD (chronic obstructive pulmonary disease) (HCC) and Hypertension.   Surgical History:  No past surgical history on  file.   Social History:   reports that he has been smoking cigarettes. He has been smoking about 2.00 packs per day. He has never used smokeless tobacco. He reports current alcohol use of about 3.0 standard drinks of alcohol per week. He reports previous drug use.   Family History:  His family history is not on file.   Allergies: No Known Allergies   Home Medications: Prior to Admission medications   Medication Sig Start Date End Date Taking? Authorizing Provider  amLODipine (NORVASC) 10 MG tablet Take 1 tablet by mouth daily. 07/28/20   [provider]  aspirin 81 MG EC tablet Take by mouth.    [provider]  atorvastatin (LIPITOR) 20 MG tablet Take 1 tablet by mouth daily. 07/28/20   [provider]  carvedilol (COREG) 25 MG tablet Take by mouth. 12/12/09   [provider]  doxazosin (CARDURA) 2 MG tablet Take 2 mg by mouth daily. 07/28/20   [provider]  folic acid (FOLVITE) 1 MG tablet Take 1 mg by mouth daily. 04/17/20   [provider]  furosemide (LASIX) 20 MG tablet Take 3 tablets (60 mg total) by mouth daily. 08/26/20   Marrion Coy, MD  levETIRAcetam (KEPPRA) 500 MG tablet Take 500 mg by mouth 2 (two) times daily. 07/28/20   [provider]  lisinopril (ZESTRIL) 5 MG tablet Take 5 mg by mouth daily. 07/28/20   [provider]  omeprazole (PRILOSEC) 20 MG capsule Take 1 capsule by mouth daily. 07/28/20   [provider]  PROAIR HFA 108 (90 Base) MCG/ACT inhaler Inhale into the lungs. 07/28/20   [provider]  SPIRIVA HANDIHALER 18 MCG inhalation capsule 1 capsule daily. 07/28/20   [provider]  thiamine 100 MG tablet Take 1 tablet (100 mg total) by mouth daily. 08/26/20   Marrion Coy, MD    Critical care time: 39 minutes   Tim Lair, [place] [facility] 09/09/20 8:04 PM  Please see [email or web address] for pager details.  From 7A-7P if no response, please call  [phone] After hours, please call E-Link [phone]

## 2020-09-09 NOTE — ED Notes (Signed)
Pt brought in via EMS actively seizing.  Placed on monitor, intubating in process

## 2020-09-09 NOTE — ED Triage Notes (Signed)
Pt brought in via EMS from home for seizure like activity.  Family states pt seized approx 1 hour prior to EMS.  EMS reports pt seizing on arrival.  7.5 mg (2.5 mg given 1126, 2.5 mg  given 1131, 2.5mg  1148) given IV.    CBG 80 versed en route via IV.   Pt seizing on arrival to ED.

## 2020-09-09 NOTE — ED Notes (Signed)
Pt transported to [facility] via Doctor, general practice by [employer].

## 2020-09-09 NOTE — Progress Notes (Signed)
eeg done °

## 2020-09-09 NOTE — Progress Notes (Signed)
LTM EEG hooked up and running - no initial skin breakdown - push button tested - neuro notified. [facility] monitored, Event button test confirmed by [facility].   

## 2020-09-09 NOTE — ED Notes (Signed)
Report given to [place], [employer]

## 2020-09-09 NOTE — Progress Notes (Signed)
eLink Physician-Brief Progress Note Patient Name: Darin Hopkins DOB: March 05, 1961 MRN: [id number]   Date of Service  09/09/2020  HPI/Events of Note  WD, thin BM with known history of seizures probably related to scarring from an old MCA territory  Infarct, who is also a chronic ETOH abuser, and was trying to cut down on his drinking, presented to [facility] ED in status epilepticus and had to be intubated to facilitate seizure suppression with Propofol and Versed, patient was transferred to [facility] for for cEEG.  eICU Interventions  New Patient Evaluation completed.        Thomasene Lot Kayleeann Huxford 09/09/2020, 7:54 PM

## 2020-09-10 ENCOUNTER — Inpatient Hospital Stay (HOSPITAL_COMMUNITY): Payer: Medicaid Other

## 2020-09-10 DIAGNOSIS — J431 Panlobular emphysema: Secondary | ICD-10-CM | POA: Diagnosis not present

## 2020-09-10 DIAGNOSIS — J9602 Acute respiratory failure with hypercapnia: Secondary | ICD-10-CM | POA: Diagnosis not present

## 2020-09-10 DIAGNOSIS — F1010 Alcohol abuse, uncomplicated: Secondary | ICD-10-CM

## 2020-09-10 DIAGNOSIS — G40901 Epilepsy, unspecified, not intractable, with status epilepticus: Secondary | ICD-10-CM

## 2020-09-10 LAB — BASIC METABOLIC PANEL
Anion gap: 9 (ref 5–15)
BUN: 30 mg/dL — ABNORMAL HIGH (ref 6–20)
CO2: 21 mmol/L — ABNORMAL LOW (ref 22–32)
Calcium: 8.2 mg/dL — ABNORMAL LOW (ref 8.9–10.3)
Chloride: 108 mmol/L (ref 98–111)
Creatinine, Ser: 1.13 mg/dL (ref 0.61–1.24)
GFR, Estimated: >60 mL/min (ref >60)
Glucose, Bld: 75 mg/dL (ref 70–99)
Potassium: 5.1 mmol/L (ref 3.5–5.1)
Sodium: 138 mmol/L (ref 135–145)

## 2020-09-10 LAB — BLOOD CULTURE ID PANEL (REFLEXED) - BCID2

## 2020-09-10 LAB — POCT I-STAT 7, (LYTES, BLD GAS, ICA,H+H)
Acid-Base Excess: 2 mmol/L (ref 0.0–2.0)
Bicarbonate: 27.6 mmol/L (ref 20.0–28.0)
Calcium, Ion: 1.3 mmol/L (ref 1.15–1.40)
HCT: 36 % — ABNORMAL LOW (ref 39.0–52.0)
Hemoglobin: 12.2 g/dL — ABNORMAL LOW (ref 13.0–17.0)
O2 Saturation: 96 %
Patient temperature: 98.6
Potassium: 3.4 mmol/L — ABNORMAL LOW (ref 3.5–5.1)
Sodium: 146 mmol/L — ABNORMAL HIGH (ref 135–145)
TCO2: 29 mmol/L (ref 22–32)
pCO2 arterial: 45.5 mmHg (ref 32.0–48.0)
pH, Arterial: 7.391 (ref 7.350–7.450)
pO2, Arterial: 86 mmHg (ref 83.0–108.0)

## 2020-09-10 LAB — CBC
HCT: 39.2 % (ref 39.0–52.0)
Hemoglobin: 12.8 g/dL — ABNORMAL LOW (ref 13.0–17.0)
MCH: 32.7 pg (ref 26.0–34.0)
MCHC: 32.7 g/dL (ref 30.0–36.0)
MCV: 100.3 fL — ABNORMAL HIGH (ref 80.0–100.0)
Platelets: 114 10*3/uL — ABNORMAL LOW (ref 150–400)
RBC: 3.91 MIL/uL — ABNORMAL LOW (ref 4.22–5.81)
RDW: 12.9 % (ref 11.5–15.5)
WBC: 14.6 10*3/uL — ABNORMAL HIGH (ref 4.0–10.5)
nRBC: 0 % (ref 0.0–0.2)

## 2020-09-10 LAB — TRIGLYCERIDES
Triglycerides: 1252 mg/dL — ABNORMAL HIGH (ref <150)
Triglycerides: 117 mg/dL (ref <150)

## 2020-09-10 LAB — MAGNESIUM: Magnesium: 2.1 mg/dL (ref 1.7–2.4)

## 2020-09-10 MED ORDER — HYDRALAZINE HCL 20 MG/ML IJ SOLN
10.0000 mg | INTRAMUSCULAR | Status: DC | PRN
  Administered 2020-09-10 – 2020-09-16 (×4): 10 mg via INTRAVENOUS
  Filled 2020-09-10 (×4): qty 1

## 2020-09-10 MED ORDER — IPRATROPIUM-ALBUTEROL 0.5-2.5 (3) MG/3ML IN SOLN
3.0000 mL | Freq: Three times a day (TID) | RESPIRATORY_TRACT | Status: DC
  Administered 2020-09-10: 3 mL via RESPIRATORY_TRACT
  Filled 2020-09-10: qty 3

## 2020-09-10 MED ORDER — CHLORHEXIDINE GLUCONATE CLOTH 2 % EX PADS
6.0000 | MEDICATED_PAD | Freq: Every day | CUTANEOUS | Status: DC
  Administered 2020-09-10 – 2020-09-16 (×6): 6 via TOPICAL

## 2020-09-10 MED ORDER — ORAL CARE MOUTH RINSE
15.0000 mL | Freq: Two times a day (BID) | OROMUCOSAL | Status: DC
  Administered 2020-09-10 – 2020-09-15 (×6): 15 mL via OROMUCOSAL

## 2020-09-10 NOTE — Progress Notes (Signed)
eLink Physician-Brief Progress Note Patient Name: Darin Hopkins DOB: [birth year] MRN: [id number]   Date of Service  09/10/2020  HPI/Events of Note  Patient with hypertension ( SBP > 160 mmHg).  eICU Interventions  Hydralazine 10 mg iv Q 4 hours PRN SBP > 160 mmHg ordered.        Thomasene Lot Ruthene Methvin 09/10/2020, 12:15 AM

## 2020-09-10 NOTE — Procedures (Signed)
Extubation Procedure Note  Patient Details:   Name: Darin Hopkins DOB: 1960-08-30 MRN: [id number]   Airway Documentation:    Vent end date: 09/10/20 Vent end time: 1154   Evaluation  O2 sats: stable throughout Complications: No apparent complications Patient did tolerate procedure well. Bilateral Breath Sounds: Clear,Diminished   Yes   Patient extubated per order to 2L [place] with no apparent complications. Positive cuff leak was noted prior to extubation. Patient is alert and oriented and is able to speak. Vitals are stable. RT will continue to monitor.   Zigmund Linse Lajuana Ripple 09/10/2020, 11:56 AM

## 2020-09-10 NOTE — Progress Notes (Signed)
LTM discontinued. No skin breakdown was seen. 

## 2020-09-10 NOTE — ED Notes (Signed)
[facility] lab called and gave critical value--positive blood culture--one anaerobic bottle --gram stain shows gram postive cocci and bcid --staph species and staph epidermidus.  Realized the patient was transferred to [place] and not discharged.  Result called to patient's RN Candise Bowens at [facility].

## 2020-09-10 NOTE — Procedures (Signed)
Patient Name: Darin Hopkins  MRN: [id number]  Epilepsy Attending: Charlsie Quest  Referring Physician/Provider: Dr Milon Dikes Duration: 09/09/2020 2026 to 09/10/2020 1235  Patient history: 59yo M with multiple seizures. EEG to evaluate for seizures  Level of alertness:  Comatose/sedated  AEDs during EEG study: Versed, propofol, LEV  Technical aspects: This EEG study was done with scalp electrodes positioned according to the 10-20 International system of electrode placement. Electrical activity was acquired at a sampling rate of 500Hz  and reviewed with a high frequency filter of 70Hz  and a low frequency filter of 1Hz . EEG data were recorded continuously and digitally stored.   Description: EEG showed continuous generalized and maximal right frontotemporal region 3 to 6 Hz theta-delta slowing admixed with 15-18Hz  generalized, maximal frontocentral region beta activity. Hyperventilation and photic stimulation were not performed.  Patient event button was pressed on 09/10/2020 at 0013.  Per RN, patient had right shoulder rhythmic movements which were difficult to visualize on camera.  Concomitant EEG before, during and after the event did not show any EEG change to suggest seizure activity  Patient event button was pressed on 09/10/2020 at 0608 for chewing movements. Concomitant EEG before, during and after the event did not show any EEG change to suggest seizure activity  ABNORMALITY - Continuous slow, generalized and maximal right frontotemporal region  IMPRESSION: This study is suggestive of cortical dysfunction in right frontotemporal region likely secondary to underlying infarct. Additionally there is evidence of moderate diffuse encephalopathy, nonspecific etiology but likely related to sedation. No seizures or epileptiform discharges were seen throughout the recording.  Two events were reported as described above without concomitant EEG change and were most likely NOT  epileptic.  Breyah Akhter 

## 2020-09-10 NOTE — Progress Notes (Addendum)
Subjective: No further seizures overnight.   ION:GEXBMW to obtain due to intubation  Examination  Vital signs in last 24 hours: Temp:  [97.5 F (36.4 C)-99.3 F (37.4 C)] 98.2 F (36.8 C) (05/04 1200) Pulse Rate:  [41-206] 57 (05/04 1400) Resp:  [14-23] 19 (05/04 1400) BP: (119-172)/(83-106) 157/93 (05/04 1400) SpO2:  [89 %-100 %] 100 % (05/04 1400) FiO2 (%):  [28 %-30 %] 28 % (05/04 1158) Weight:  [59.3 kg] 59.3 kg (05/04 0500)  General: lying in bed, NAD CVS: pulse-normal rate and rhythm RS: breathing comfortably, intubated Extremities: normal, warm  Neuro: MS: Alert,  follows commands CN: pupils equal and reactive,  EOMI, face symmetric, tongue midline, normal sensation over face, Motor: antigravity strength in all 4 extremities  Basic Metabolic Panel: Recent Labs  Lab 09/09/20 1152 09/10/20 0337 09/10/20 0452  NA 145 146* 138  K 4.2 3.4* 5.1  CL 107  --  108  CO2 30  --  21*  GLUCOSE 84  --  75  BUN 36*  --  30*  CREATININE 1.38*  --  1.13  CALCIUM 8.7*  --  8.2*  MG 2.0  --  2.1    CBC: Recent Labs  Lab 09/09/20 1152 09/10/20 0337 09/10/20 0452  WBC 12.2*  --  14.6*  NEUTROABS 5.1  --   --   HGB 11.4* 12.2* 12.8*  HCT 37.4* 36.0* 39.2  MCV 102.2*  --  100.3*  PLT 183  --  114*     Coagulation Studies: No results for input(s): LABPROT, INR in the last 72 hours.  Imaging   CTH wo contrast 09/09/2020 No acute intracranial abnormalities. 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  ASSESSMENT AND PLAN: 60 year old man with past medical history of prior strokes, alcohol abuse, tobacco abuse, seizures, CKD 3, hypertension, hyperlipidemia, COPD, heart failure, severe malnutrition, presented with status epilepticus requiring intubation and started on lev, versed drip.  Status epilepticus, resolved New onset Epilepsy Chronic Stroke Leucocytosis AKI -  LTM eeg suggestive of cortical dysfunction in right frontotemporal region likely secondary to underlying infarct. Additionally there is evidence of moderate diffuse encephalopathy, nonspecific etiology but likely related to sedation.No seizures or epileptiform discharges were seen throughout the recording. - Two events were reported as described above without concomitant EEG change and were most likely NOT epileptic. - etiology of sz: likely due to underlying stroke - leucocytosis: no evidence of infection so far, could be due to seizure/status   Recommendations - on versed @10mlhr . Will stop as patient has not had any seizures overnight - Continue keppra 750mg  bid, can switch to PO once able to take PO - DC ltm in remains seizure free after discontinuing versed - seizure precautions - prn iv ativan for seizure lasting> 2 mins - Management of rest of comorbidities per primary team  CRITICAL CARE Performed by:   Total critical care time: 35 minutes  Critical care time was exclusive of separately billable procedures and treating other patients.  Critical care was necessary to treat or prevent imminent or life-threatening deterioration.  Critical care was time spent personally by me on the following activities: development of treatment plan with patient and/or surrogate as well as nursing, discussions with consultants, evaluation of patient's response to treatment, examination of patient, obtaining history from patient or surrogate, ordering and performing treatments and interventions, ordering and review of laboratory studies, ordering and review of  radiographic studies, pulse oximetry and re-evaluation of patient's condition.    Zeb Comfort Epilepsy [facility] For questions after 5pm please refer to AMION to reach the Neurologist on call

## 2020-09-10 NOTE — H&P (Signed)
NAME:  Darin Hopkins, MRN:  [id number], DOB:  [birth year], LOS: 1 ADMISSION DATE:  09/09/2020, CONSULTATION DATE:  09/09/2020 REFERRING MD:  [facility], CHIEF COMPLAINT:  Status epilepticus   History of Present Illness:  60 year old male with PMHx significant for HTN, HLD, HFpEF (EF 60-65% 08/2020), R MCA CVA, seizures (on Keppra, reportedly nonadherent with medications), EtOH abuse, COPD (on home O2, noncompliant), CKD stage III who presented as a transfer from [facility] for management of status epilepticus presumed 2/2 EtOH versus medication nonadherence. Of note, patient was recently discharged after being hospitalized 4/20-4/26 for presumed AECOPD.  Patient initially presented to [facility] ED via EMS 5/3 with ~1 hour of seizure activity at home prior to EMS contact; per EMS continued to seize en route to ED and continued for several hours after presentation. On arrival, patient was intubated as he was in presumed status epilepticus with AMS and unable to protect his airway. Per chart review (Neurology - Dr. Iver Nestle), at the time of presentation patient had a rhythmic leftward gaze and L arm jerking/twitching. Around 1700 5/3, neurologic exam was reportedly improved with increased responsiveness; however, patient was noted to be bradycardic to 40s and sedation was decreased. CT Head was negative for acute process and showed stable old R MCA territory infarct. STAT EEG obtained at [facility] demonstrated moderate diffuse encephalopathy but no epileptiform discharges.  Patient was transferred to [facility] for LTM EEG and further management of status epilepticus.  Pertinent  Medical History  HTN, HLD, HFpEF (EF 60-65% 08/2020), R MCA CVA, seizures (on Keppra), EtOH abuse, COPD, CKD stage III  Significant Hospital Events: Including procedures, antibiotic start and stop dates in addition to other pertinent events   . 5/3 Transferred from [facility] for status epilepticus, need for LTM EEG and higher level of care. Keppra load, sedated on  Prop/Versed. Focal L-sided ?seizure activity earlier in day, improved. Neuro/Epilepsy consult. . 5/4 continuous EEG showed no seizures, propofol was stopped due to hypertriglyceridemia, after stopping Versed patient started following commands  Interim History / Subjective:  Patient serum triglyceride level came back > 1000, propofol was stopped EEG showed no seizures  Objective   Blood pressure (!) 151/90, pulse 66, temperature 99.3 F (37.4 C), temperature source Oral, resp. rate 16, weight 59.3 kg, SpO2 96 %.    Vent Mode: PRVC FiO2 (%):  [30 %-80 %] 30 % Set Rate:  [16 bmp] 16 bmp Vt Set:  [450 mL-550 mL] 550 mL PEEP:  [5 cmH20] 5 cmH20 Plateau Pressure:  [12 cmH20-26 cmH20] 12 cmH20   Intake/Output Summary (Last 24 hours) at 09/10/2020 1034 Last data filed at 09/10/2020 1000 Gross per 24 hour  Intake 456.17 ml  Output 730 ml  Net -273.83 ml   Filed Weights   09/10/20 0500  Weight: 59.3 kg    Physical Examination: General: Chronically ill-appearing middle-aged man, orally intubated HEENT: [place]/AT, anicteric sclera, pupils equal 55mm, sluggish bilaterally, moist mucous membranes. Facial muscle wasting noted. Neuro:  Awake, following commands, antigravity on right side, plegic on left CV: Regular rate and rhythm, no murmur PULM:  Clear to auscultation bilaterally, no wheezes or rhonchi GI: Soft, nontender, nondistended. Normoactive bowel sounds. Extremities: No LE edema noted. Skin: Warm/dry, no significant rashes. Ecchymosis noted to R forearm.  Labs/imaging that I have personally reviewed: (right click and "Reselect all SmartList Selections" daily)  WBC 14.6 Plt 114 Serum potassium 5.1, serum creatinine 1.1   Resolved Hospital Problem list     Assessment & Plan:  Breakthrough seizure  with concern for status epilepticus in the setting of EtOH use versus medication nonadherence Prior R MCA territory stroke Patient was transferred from [facility] with suspected status  epilepticus Continuous EEG is negative for active seizures Even though he was noted to have twitching of his lower eyelid bilaterally, stimulus induced Propofol and Versed was stopped Continue Keppra 750 mg twice daily Neurology is following  Follow-up Keppra level  Acute hypoxic/hypercapnic respiratory failure in the setting of seizure/status epilepticus Acute encephalopathy due to seizure versus sedating medications Patient was intubated for hypoxia and hypercapnia Now he is tolerating pressure support trial Will watch for respiratory distress and see if he can be extubated today  Hypertension Chronic HFpEF Resume Lisinopril, ASA, Lasix. Hold home BB medications in the setting of bradycardia  Acute kidney injury on CKD stage IIIa Patient presented with serum creatinine of 1.4, now it is 1.1 Continue to monitor serum creatinine and electrolytes Avoid nephrotoxic agents as able  EtOH abuse EtOH level < 10 on admission, reportedly trying to stop drinking. UDS +benzodiazepines. Monitor for additional signs/symptoms og withdrawal Continue thiamine/folic acid   Best practice (right click and "Reselect all SmartList Selections" daily)  Diet:  NPO Pain/Anxiety/Delirium protocol (if indicated): RASS goal 0 VAP protocol (if indicated): Yes DVT prophylaxis: Subcutaneous Heparin GI prophylaxis: PPI Glucose control:  SSI No Central venous access:  N/A Arterial line:  N/A Foley: DC Foley Mobility:  bed rest  PT consulted: N/A Last date of multidisciplinary goals of care discussion: Code Status:  full code Disposition: ICU  Labs:   CBC: Recent Labs  Lab 09/09/20 1152 09/10/20 0337 09/10/20 0452  WBC 12.2*  --  14.6*  NEUTROABS 5.1  --   --   HGB 11.4* 12.2* 12.8*  HCT 37.4* 36.0* 39.2  MCV 102.2*  --  100.3*  PLT 183  --  114*   Basic Metabolic Panel: Recent Labs  Lab 09/09/20 1152 09/10/20 0337 09/10/20 0452  NA 145 146* 138  K 4.2 3.4* 5.1  CL 107  --  108   CO2 30  --  21*  GLUCOSE 84  --  75  BUN 36*  --  30*  CREATININE 1.38*  --  1.13  CALCIUM 8.7*  --  8.2*  MG 2.0  --  2.1   GFR: Estimated Creatinine Clearance: 59 mL/min (by C-G formula based on SCr of 1.13 mg/dL). Recent Labs  Lab 09/09/20 1152 09/10/20 0452  WBC 12.2* 14.6*    Liver Function Tests: Recent Labs  Lab 09/09/20 1152  AST 25  ALT 24  ALKPHOS 36*  BILITOT 0.6  PROT 6.9  ALBUMIN 3.8   No results for input(s): LIPASE, AMYLASE in the last 168 hours. No results for input(s): AMMONIA in the last 168 hours.  ABG:    Component Value Date/Time   PHART 7.391 09/10/2020 0337   PCO2ART 45.5 09/10/2020 0337   PO2ART 86 09/10/2020 0337   HCO3 27.6 09/10/2020 0337   TCO2 29 09/10/2020 0337   O2SAT 96.0 09/10/2020 0337    Coagulation Profile: No results for input(s): INR, PROTIME in the last 168 hours.  Cardiac Enzymes: No results for input(s): CKTOTAL, CKMB, CKMBINDEX, TROPONINI in the last 168 hours.  HbA1C: No results found for: HGBA1C  CBG: Recent Labs  Lab 09/09/20 1202  GLUCAP 79    Critical care time:    Total critical care time: 41 minutes  Performed by: Cheri Fowler   Critical care time was exclusive of separately billable procedures  and treating other patients.   Critical care was necessary to treat or prevent imminent or life-threatening deterioration.   Critical care was time spent personally by me on the following activities: development of treatment plan with patient and/or surrogate as well as nursing, discussions with consultants, evaluation of patient's response to treatment, examination of patient, obtaining history from patient or surrogate, ordering and performing treatments and interventions, ordering and review of laboratory studies, ordering and review of radiographic studies, pulse oximetry and re-evaluation of patient's condition.   Cheri Fowler MD [facility] See Amion for pager If no response to  pager, please call [phone] until 7pm After 7pm, Please call E-link [phone]

## 2020-09-10 NOTE — Progress Notes (Signed)
Contacted E-Link to clarify blood pressure parameters. Will continue to monitor.

## 2020-09-11 ENCOUNTER — Encounter (HOSPITAL_COMMUNITY): Payer: Self-pay | Admitting: Pulmonary Disease

## 2020-09-11 ENCOUNTER — Other Ambulatory Visit: Payer: Self-pay

## 2020-09-11 DIAGNOSIS — Z87898 Personal history of other specified conditions: Secondary | ICD-10-CM

## 2020-09-11 DIAGNOSIS — I5033 Acute on chronic diastolic (congestive) heart failure: Secondary | ICD-10-CM

## 2020-09-11 DIAGNOSIS — E782 Mixed hyperlipidemia: Secondary | ICD-10-CM

## 2020-09-11 DIAGNOSIS — K219 Gastro-esophageal reflux disease without esophagitis: Secondary | ICD-10-CM

## 2020-09-11 DIAGNOSIS — I10 Essential (primary) hypertension: Secondary | ICD-10-CM

## 2020-09-11 LAB — LEVETIRACETAM LEVEL: Levetiracetam Lvl: 58.8 ug/mL — ABNORMAL HIGH (ref 10.0–40.0)

## 2020-09-11 MED ORDER — TIOTROPIUM BROMIDE MONOHYDRATE 18 MCG IN CAPS: 1.0000 | ORAL_CAPSULE | Freq: Every day | RESPIRATORY_TRACT | Status: DC

## 2020-09-11 MED ORDER — UMECLIDINIUM BROMIDE 62.5 MCG/INH IN AEPB
1.0000 | INHALATION_SPRAY | Freq: Every day | RESPIRATORY_TRACT | Status: DC
  Administered 2020-09-12 – 2020-09-17 (×6): 1 via RESPIRATORY_TRACT
  Filled 2020-09-11: qty 7

## 2020-09-11 MED ORDER — ATORVASTATIN CALCIUM 10 MG PO TABS
20.0000 mg | ORAL_TABLET | Freq: Every day | ORAL | Status: DC
  Administered 2020-09-11 – 2020-09-17 (×7): 20 mg via ORAL
  Filled 2020-09-11 (×6): qty 2

## 2020-09-11 MED ORDER — POLYETHYLENE GLYCOL 3350 17 G PO PACK: 17.0000 g | PACK | Freq: Every day | ORAL | Status: DC | PRN

## 2020-09-11 MED ORDER — PANTOPRAZOLE SODIUM 40 MG PO TBEC
40.0000 mg | DELAYED_RELEASE_TABLET | Freq: Every day | ORAL | Status: DC
  Administered 2020-09-11 – 2020-09-17 (×7): 40 mg via ORAL
  Filled 2020-09-11 (×7): qty 1

## 2020-09-11 MED ORDER — ADULT MULTIVITAMIN W/MINERALS CH
1.0000 | ORAL_TABLET | Freq: Every day | ORAL | Status: DC
  Administered 2020-09-11 – 2020-09-17 (×7): 1 via ORAL
  Filled 2020-09-11 (×7): qty 1

## 2020-09-11 MED ORDER — LEVETIRACETAM 750 MG PO TABS
750.0000 mg | ORAL_TABLET | Freq: Two times a day (BID) | ORAL | Status: DC
  Administered 2020-09-11 – 2020-09-17 (×14): 750 mg via ORAL
  Filled 2020-09-11 (×14): qty 1

## 2020-09-11 MED ORDER — AMLODIPINE BESYLATE 10 MG PO TABS
10.0000 mg | ORAL_TABLET | Freq: Every day | ORAL | Status: DC
  Administered 2020-09-11 – 2020-09-17 (×7): 10 mg via ORAL
  Filled 2020-09-11 (×6): qty 1

## 2020-09-11 MED ORDER — ASPIRIN EC 81 MG PO TBEC
81.0000 mg | DELAYED_RELEASE_TABLET | Freq: Every day | ORAL | Status: DC
  Administered 2020-09-11 – 2020-09-17 (×7): 81 mg via ORAL
  Filled 2020-09-11 (×7): qty 1

## 2020-09-11 MED ORDER — DOXAZOSIN MESYLATE 2 MG PO TABS
2.0000 mg | ORAL_TABLET | Freq: Every day | ORAL | Status: DC
  Administered 2020-09-11 – 2020-09-17 (×7): 2 mg via ORAL
  Filled 2020-09-11 (×8): qty 1

## 2020-09-11 MED ORDER — DOCUSATE SODIUM 100 MG PO CAPS: 100.0000 mg | ORAL_CAPSULE | Freq: Two times a day (BID) | ORAL | Status: DC | PRN

## 2020-09-11 MED ORDER — THIAMINE HCL 100 MG PO TABS
100.0000 mg | ORAL_TABLET | Freq: Every day | ORAL | Status: DC
  Administered 2020-09-11 – 2020-09-17 (×7): 100 mg via ORAL
  Filled 2020-09-11 (×7): qty 1

## 2020-09-11 MED ORDER — FOLIC ACID 1 MG PO TABS
1.0000 mg | ORAL_TABLET | Freq: Every day | ORAL | Status: DC
  Administered 2020-09-11 – 2020-09-17 (×7): 1 mg via ORAL
  Filled 2020-09-11 (×7): qty 1

## 2020-09-11 MED ORDER — CARVEDILOL 12.5 MG PO TABS
12.5000 mg | ORAL_TABLET | Freq: Two times a day (BID) | ORAL | Status: DC
  Administered 2020-09-11 – 2020-09-17 (×12): 12.5 mg via ORAL
  Filled 2020-09-11 (×11): qty 1

## 2020-09-11 MED ORDER — LISINOPRIL 5 MG PO TABS
5.0000 mg | ORAL_TABLET | Freq: Every day | ORAL | Status: DC
  Administered 2020-09-11 – 2020-09-17 (×7): 5 mg via ORAL
  Filled 2020-09-11 (×6): qty 1

## 2020-09-11 MED ORDER — ENSURE ENLIVE PO LIQD
237.0000 mL | Freq: Three times a day (TID) | ORAL | Status: DC
  Administered 2020-09-11: 237 mL via ORAL

## 2020-09-11 NOTE — Progress Notes (Signed)
Subjective: No seizures overnight. Denies prior seizures, alcohol use, recent infections  ROS: negative except above  Examination  Vital signs in last 24 hours: Temp:  [97.8 F (36.6 C)-98.8 F (37.1 C)] 98.2 F (36.8 C) (05/05 0506) Pulse Rate:  [57-206] 88 (05/05 0604) Resp:  [15-25] 15 (05/05 0506) BP: (115-183)/(73-143) 165/94 (05/05 0604) SpO2:  [88 %-100 %] 96 % (05/05 0759) FiO2 (%):  [28 %-30 %] 28 % (05/04 1158)  General: lying in bed, NAD CVS: pulse-normal rate and rhythm RS: breathing comfortably Extremities: normal  Neuro: AOx3, CN 2-12 grossly intact, left hemiparesis with 4/5 strength, 5/5 in right upper and lower extremity  Basic Metabolic Panel: Recent Labs  Lab 09/09/20 1152 09/10/20 0337 09/10/20 0452  NA 145 146* 138  K 4.2 3.4* 5.1  CL 107  --  108  CO2 30  --  21*  GLUCOSE 84  --  75  BUN 36*  --  30*  CREATININE 1.38*  --  1.13  CALCIUM 8.7*  --  8.2*  MG 2.0  --  2.1    CBC: Recent Labs  Lab 09/09/20 1152 09/10/20 0337 09/10/20 0452  WBC 12.2*  --  14.6*  NEUTROABS 5.1  --   --   HGB 11.4* 12.2* 12.8*  HCT 37.4* 36.0* 39.2  MCV 102.2*  --  100.3*  PLT 183  --  114*     Coagulation Studies: No results for input(s): LABPROT, INR in the last 72 hours.  Imaging No new brain imaging overnight  ASSESSMENT AND PLAN: 60 year old man with past medical history of prior strokes, alcohol abuse, tobacco abuse, seizures, CKD 3, hypertension, hyperlipidemia, COPD, heart failure, severe malnutrition, presented with status epilepticus requiring intubation and started on lev, versed drip.  Status epilepticus, resolved New onset Epilepsy Chronic Stroke Leucocytosis AKI - Etiology of sz: likely due to underlying stroke - Leucocytosis: no evidence of infection so far, could be due to seizure/status  Recommendations - Continue Keppra 750mg  bid - seizure precautions - prn iv ativan for seizure lasting> 2 mins - Management of rest of  comorbidities per primary team - F/u with outpatient neurology in 12-14 weeks  Seizure precautions: Per [facility] statutes, patients with seizures are not allowed to drive until they have been seizure-free for six months and cleared by a physician    Use caution when using heavy equipment or power tools. Avoid working on ladders or at heights. Take showers instead of baths. Ensure the water temperature is not too high on the home water heater. Do not go swimming alone. Do not lock yourself in a room alone (i.e. bathroom). When caring for infants or small children, sit down when holding, feeding, or changing them to minimize risk of injury to the child in the event you have a seizure. Maintain good sleep hygiene. Avoid alcohol.    If patient has another seizure, call 911 and bring them back to the ED if: A.  The seizure lasts longer than 5 minutes.      B.  The patient doesn't wake shortly after the seizure or has new problems such as difficulty seeing, speaking or moving following the seizure C.  The patient was injured during the seizure D.  The patient has a temperature over 102 F (39C) E.  The patient vomited during the seizure and now is having trouble breathing    During the Seizure   - First, ensure adequate ventilation and place patients on the floor on their  left side  Loosen clothing around the neck and ensure the airway is patent. If the patient is clenching the teeth, do not force the mouth open with any object as this can cause severe damage - Remove all items from the surrounding that can be hazardous. The patient may be oblivious to what's happening and may not even know what he or she is doing. If the patient is confused and wandering, either gently guide him/her away and block access to outside areas - Reassure the individual and be comforting - Call 911. In most cases, the seizure ends before EMS arrives. However, there are cases when seizures may last over 3 to 5  minutes. Or the individual may have developed breathing difficulties or severe injuries. If a pregnant patient or a person with diabetes develops a seizure, it is prudent to call an ambulance. - Finally, if the patient does not regain full consciousness, then call EMS. Most patients will remain confused for about 45 to 90 minutes after a seizure, so you must use judgment in calling for help. - Avoid restraints but make sure the patient is in a bed with padded side rails - Place the individual in a lateral position with the neck slightly flexed; this will help the saliva drain from the mouth and prevent the tongue from falling backward - Remove all nearby furniture and other hazards from the area - Provide verbal assurance as the individual is regaining consciousness - Provide the patient with privacy if possible - Call for help and start treatment as ordered by the caregiver    After the Seizure (Postictal Stage)   After a seizure, most patients experience confusion, fatigue, muscle pain and/or a headache. Thus, one should permit the individual to sleep. For the next few days, reassurance is essential. Being calm and helping reorient the person is also of importance.   Most seizures are painless and end spontaneously. Seizures are not harmful to others but can lead to complications such as stress on the lungs, brain and the heart. Individuals with prior lung problems may develop labored breathing and respiratory distress.    I have spent a total of  40  minutes with the patient reviewing hospital notes,  test results, labs and examining the patient as well as establishing an assessment and plan that was discussed personally with the patient.  > 50% of time was spent in direct patient care.     Lindie Spruce Epilepsy [facility] For questions after 5pm please refer to AMION to reach the Neurologist on call

## 2020-09-11 NOTE — Progress Notes (Signed)
PROGRESS NOTE  Darin Hopkins Darin Hopkins:[id number] DOB: 08-23-1960 DOA: 09/09/2020 PCP: Martie Round, NP   LOS: 2 days   Brief narrative: 60 year old male with past medical history significant for hypertension, hyperlipidemia, heart failure with preserved ejection fraction, CVA, seizures noncompliant on medication, alcohol abuse, COPD on home oxygen noncompliant with, CKD stage III presented to hospital at [facility] with episodes of seizures.  Patient was recently admitted to hospital from [id number] for presumed acute exacerbation of COPD.  Patient had 1 hour of seizure activity prior to coming to the hospital a was intubated for airway protection.  Patient was then transferred to [facility] for further care including LTM EEG and management of status epilepticus..  CT head scan was negative for acute findings.   Patient was subsequently considered stable for transfer out of the ICU.  Assessment/Plan:  Principal Problem:   Acute hypercapnic respiratory failure (HCC) Active Problems:   Acute exacerbation of CHF (congestive heart failure) (HCC)   Essential hypertension   History of seizure   COPD (chronic obstructive pulmonary disease) (HCC)   Hyperlipidemia   GERD (gastroesophageal reflux disease)   Alcohol abuse   Status epilepticus (HCC)   Breakthrough seizure with concern for status epilepticus in the setting of EtOH use versus medication non-adherence, Prior R MCA territory stroke Patient was initially intubated for airway protection.  Neurology was consulted.  Patient has been changed to Keppra 750 twice daily at this time and has been extubated.  Was initially on propofol and Versed.  Continuous EEG was performed which was negative for active seizures.  Neurology has recommended outpatient follow-up with neurology at [facility] in about 12 weeks.    Acute hypoxic/hypercapnic respiratory failure on the background of Status epilepticus and underlying COPD.  Patient was initially  intubated for hypoxia hypercapnia in for airway protection.  Status post extubation and currently on oxygen by nasal cannula.  Continue Pulmicort inhalers,, albuterol nebulizers.  States that he is not really compliant to his oxygen regimen.  Will need to continue at home.  Resume the Spiriva from home.  Mild leukocytosis could be reactive.  No signs of infection.  Continue to monitor closely.  Check CBC in a.m.  Essential hypertension, Chronic HFpEF Will resume amlodipine, lisinopril, doxazosin, coreg. On as needed hydralazine.  Acute kidney injury on CKD stage IIIa Improved at this time.  Creatinine of 1.1.  Likely at baseline.  Lasix from home.  History of EtOH abuse Reportedly patient was trying to stop drinking.  UDS was positive for benzodiazepines.  Continue thiamine folic acid.  Watch closely for withdrawal symptoms.  Add as needed Ativan.  Hypertriglyceridemia.  Patient is at risk for pancreatitis.  Alcohol cessation was explained including lifestyle modification.  Patient will benefit from lipid-lowering agents but has history of noncompliance.  We will resume Lipitor.  Deconditioning, debility, patient states that he lives at home and has a walker for ambulation.  We will get physical therapy evaluation.   DVT prophylaxis: heparin injection 5,000 Units Start: 09/10/20 0600 SCDs Start: 09/09/20 1954   Code Status: Full code  Family Communication:  None today.  Status is: Inpatient  Remains inpatient appropriate because:IV treatments appropriate due to intensity of illness or inability to take PO and Inpatient level of care appropriate due to severity of illness   Dispo: The patient is from: Home              Anticipated d/c is to: Home, will get PT evaluation  Patient currently is not medically stable to d/c.   Difficult to place patient No   Consultants:  PCCM  Neurology  Procedures:  EEG,   intubation and mechanical  ventilation  Anti-infectives:  . None  Anti-infectives (From admission, onward)   None     Subjective: Today, patient was seen and examined at bedside.  Patient denies any dizziness, lightheadedness further procedures.  Has shortness of breath cough and has generalized weakness.  Objective: Vitals:   09/11/20 0759 09/11/20 1008  BP:  (!) 154/102  Pulse:  94  Resp:  18  Temp:  98.7 F (37.1 C)  SpO2: 96% 94%    Intake/Output Summary (Last 24 hours) at 09/11/2020 1353 Last data filed at 09/10/2020 1800 Gross per 24 hour  Intake --  Output 750 ml  Net -750 ml   Filed Weights   09/10/20 0500  Weight: 59.3 kg   Body mass index is 19.88 kg/m.   Physical Exam: GENERAL: Patient is alert awake and oriented. Not in obvious distress.  Thinly built, on nasal cannula oxygen, HENT: No scleral pallor or icterus. Pupils equally reactive to light. Oral mucosa is moist NECK: is supple, no gross swelling noted. CHEST: Clear to auscultation. No crackles or wheezes.  Diminished breath sounds bilaterally. CVS: S1 and S2 heard, no murmur. Regular rate and rhythm.  ABDOMEN: Soft, non-tender, bowel sounds are present. EXTREMITIES: No edema. CNS: Cranial nerves are intact. No focal motor deficits. SKIN: warm and dry without rashes.  Data Review: I have personally reviewed the following laboratory data and studies,  CBC: Recent Labs  Lab 09/09/20 1152 09/10/20 0337 09/10/20 0452  WBC 12.2*  --  14.6*  NEUTROABS 5.1  --   --   HGB 11.4* 12.2* 12.8*  HCT 37.4* 36.0* 39.2  MCV 102.2*  --  100.3*  PLT 183  --  114*   Basic Metabolic Panel: Recent Labs  Lab 09/09/20 1152 09/10/20 0337 09/10/20 0452  NA 145 146* 138  K 4.2 3.4* 5.1  CL 107  --  108  CO2 30  --  21*  GLUCOSE 84  --  75  BUN 36*  --  30*  CREATININE 1.38*  --  1.13  CALCIUM 8.7*  --  8.2*  MG 2.0  --  2.1   Liver Function Tests: Recent Labs  Lab 09/09/20 1152  AST 25  ALT 24  ALKPHOS 36*  BILITOT 0.6   PROT 6.9  ALBUMIN 3.8   No results for input(s): LIPASE, AMYLASE in the last 168 hours. No results for input(s): AMMONIA in the last 168 hours. Cardiac Enzymes: No results for input(s): CKTOTAL, CKMB, CKMBINDEX, TROPONINI in the last 168 hours. BNP (last 3 results) Recent Labs    08/17/20 1037 08/27/20 1304  BNP 1,306.1* 285.0*    ProBNP (last 3 results) No results for input(s): PROBNP in the last 8760 hours.  CBG: Recent Labs  Lab 09/09/20 1202  GLUCAP 79   Recent Results (from the past 240 hour(s))  Resp Panel by RT-PCR (Flu A&B, Covid) Nasopharyngeal Swab     Status: None   Collection Time: 09/09/20  1:46 PM   Specimen: Nasopharyngeal Swab; Nasopharyngeal(NP) swabs in vial transport medium  Result Value Ref Range Status   SARS Coronavirus 2 by RT PCR NEGATIVE NEGATIVE Final    Comment: (NOTE) SARS-CoV-2 target nucleic acids are NOT DETECTED.  The SARS-CoV-2 RNA is generally detectable in upper respiratory specimens during the acute phase of infection. The lowest  concentration of SARS-CoV-2 viral copies this assay can detect is 138 copies/mL. A negative result does not preclude SARS-Cov-2 infection and should not be used as the sole basis for treatment or other patient management decisions. A negative result may occur with  improper specimen collection/handling, submission of specimen other than nasopharyngeal swab, presence of viral mutation(s) within the areas targeted by this assay, and inadequate number of viral copies(<138 copies/mL). A negative result must be combined with clinical observations, patient history, and epidemiological information. The expected result is Negative.  Fact Sheet for Patients:  BloggerCourse.com  Fact Sheet for Healthcare Providers:  SeriousBroker.it  This test is no t yet approved or cleared by the [place] FDA and  has been authorized for detection and/or diagnosis of  SARS-CoV-2 by FDA under an Emergency Use Authorization (EUA). This EUA will remain  in effect (meaning this test can be used) for the duration of the COVID-19 declaration under Section 564(b)(1) of the Act, 21 U.S.C.section 360bbb-3(b)(1), unless the authorization is terminated  or revoked sooner.       Influenza A by PCR NEGATIVE NEGATIVE Final   Influenza B by PCR NEGATIVE NEGATIVE Final    Comment: (NOTE) The Xpert Xpress SARS-CoV-2/FLU/RSV plus assay is intended as an aid in the diagnosis of influenza from Nasopharyngeal swab specimens and should not be used as a sole basis for treatment. Nasal washings and aspirates are unacceptable for Xpert Xpress SARS-CoV-2/FLU/RSV testing.  Fact Sheet for Patients: BloggerCourse.com  Fact Sheet for Healthcare Providers: SeriousBroker.it  This test is not yet approved or cleared by the [place] FDA and has been authorized for detection and/or diagnosis of SARS-CoV-2 by FDA under an Emergency Use Authorization (EUA). This EUA will remain in effect (meaning this test can be used) for the duration of the COVID-19 declaration under Section 564(b)(1) of the Act, 21 U.S.C. section 360bbb-3(b)(1), unless the authorization is terminated or revoked.  Performed at [facility], [address] [place], [place] [address]   Blood culture (routine x 2)     Status: None (Preliminary result)   Collection Time: 09/09/20  1:46 PM   Specimen: BLOOD  Result Value Ref Range Status   Specimen Description BLOOD BLOOD LEFT FOREARM  Final   Special Requests   Final    BOTTLES DRAWN AEROBIC AND ANAEROBIC Blood Culture adequate volume   Culture   Final    NO GROWTH 2 DAYS Performed at [facility], [address], [place], [place] [address]    Report Status PENDING  Incomplete  Blood culture (routine x 2)     Status: Abnormal (Preliminary result)   Collection Time: 09/09/20  1:46  PM   Specimen: BLOOD  Result Value Ref Range Status   Specimen Description   Final    BLOOD LEFT ANTECUBITAL Performed at [facility], [address] [place], [place] [address]    Special Requests   Final    BOTTLES DRAWN AEROBIC AND ANAEROBIC Blood Culture adequate volume Performed at [facility], [address] [place], [place] [address]    Culture  Setup Time   Final    Organism ID to follow GRAM POSITIVE COCCI AEROBIC BOTTLE ONLY CRITICAL RESULT CALLED TO, READ BACK BY AND VERIFIED WITH: Viviano Simas RN  09/10/20 SCS Performed at [facility], [address] [place], [place] [address]    Culture (A)  Final    STAPHYLOCOCCUS EPIDERMIDIS THE SIGNIFICANCE OF ISOLATING THIS ORGANISM FROM A SINGLE SET OF  BLOOD CULTURES WHEN MULTIPLE SETS ARE DRAWN IS UNCERTAIN. PLEASE NOTIFY THE MICROBIOLOGY DEPARTMENT WITHIN ONE WEEK IF SPECIATION AND SENSITIVITIES ARE REQUIRED. Performed at [facility], [address], [place], [place] [address]    Report Status PENDING  Incomplete  Blood Culture ID Panel (Reflexed)     Status: Abnormal   Collection Time: 09/09/20  1:46 PM  Result Value Ref Range Status   Enterococcus faecalis NOT DETECTED NOT DETECTED Final   Enterococcus Faecium NOT DETECTED NOT DETECTED Final   Listeria monocytogenes NOT DETECTED NOT DETECTED Final   Staphylococcus species DETECTED (A) NOT DETECTED Final    Comment: CRITICAL RESULT CALLED TO, READ BACK BY AND VERIFIED WITH: Viviano Simas RN  09/10/20 SCS    Staphylococcus aureus (BCID) NOT DETECTED NOT DETECTED Final   Staphylococcus epidermidis DETECTED (A) NOT DETECTED Final    Comment: CRITICAL RESULT CALLED TO, READ BACK BY AND VERIFIED WITH: Viviano Simas RN  09/10/20 SCS    Staphylococcus lugdunensis NOT DETECTED NOT DETECTED Final   Streptococcus species NOT DETECTED NOT DETECTED Final   Streptococcus agalactiae NOT DETECTED NOT DETECTED Final   Streptococcus pneumoniae  NOT DETECTED NOT DETECTED Final   Streptococcus pyogenes NOT DETECTED NOT DETECTED Final   A.calcoaceticus-baumannii NOT DETECTED NOT DETECTED Final   Bacteroides fragilis NOT DETECTED NOT DETECTED Final   Enterobacterales NOT DETECTED NOT DETECTED Final   Enterobacter cloacae complex NOT DETECTED NOT DETECTED Final   Escherichia coli NOT DETECTED NOT DETECTED Final   Klebsiella aerogenes NOT DETECTED NOT DETECTED Final   Klebsiella oxytoca NOT DETECTED NOT DETECTED Final   Klebsiella pneumoniae NOT DETECTED NOT DETECTED Final   Proteus species NOT DETECTED NOT DETECTED Final   Salmonella species NOT DETECTED NOT DETECTED Final   Serratia marcescens NOT DETECTED NOT DETECTED Final   Haemophilus influenzae NOT DETECTED NOT DETECTED Final   Neisseria meningitidis NOT DETECTED NOT DETECTED Final   Pseudomonas aeruginosa NOT DETECTED NOT DETECTED Final   Stenotrophomonas maltophilia NOT DETECTED NOT DETECTED Final   Candida albicans NOT DETECTED NOT DETECTED Final   Candida auris NOT DETECTED NOT DETECTED Final   Candida glabrata NOT DETECTED NOT DETECTED Final   Candida krusei NOT DETECTED NOT DETECTED Final   Candida parapsilosis NOT DETECTED NOT DETECTED Final   Candida tropicalis NOT DETECTED NOT DETECTED Final   Cryptococcus neoformans/gattii NOT DETECTED NOT DETECTED Final   Methicillin resistance mecA/C NOT DETECTED NOT DETECTED Final    Comment: Performed at [facility], [address] [place], [place] [address]     Studies: CT Head Wo Contrast  Result Date: 09/09/2020 CLINICAL DATA:  Pt brought in via EMS from home for seizure like activity. Family states pt seized approx 1 hour prior to EMS. EMS reports pt seizing on arrival. EXAM: CT HEAD WITHOUT CONTRAST TECHNIQUE: Contiguous axial images were obtained from the base of the skull through the vertex without intravenous contrast. COMPARISON:  08/27/2020 FINDINGS: Brain: No evidence of acute infarction, hemorrhage,  hydrocephalus, extra-axial collection or mass lesion/mass effect. Encephalomalacia walls lateral right frontal lobe and right basal and external capsule/insular ribbon consistent with an old infarct, stable from the prior head CT. Mild ex vacuo dilation of the right lateral ventricle. Small old lacunar infarct in the left basal ganglia. Additional areas of patchy white matter hypoattenuation also noted in stable consistent moderate chronic microvascular ischemic change. Vascular: No hyperdense vessel or unexpected calcification. Skull: Normal. Negative for fracture or focal lesion. Sinuses/Orbits: Globes and orbits are  unremarkable. Mild ethmoid and minor inferior frontal maxillary sinus mucosal thickening. Other: None. IMPRESSION: 1. No acute intracranial abnormalities. 2. 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 Electronically Signed   By: Amie Portland M.D.   On: 09/09/2020 14:45   DG Chest Port 1 View  Result Date: 09/10/2020 CLINICAL DATA:  COPD. EXAM: PORTABLE CHEST 1 VIEW COMPARISON:  09/09/2020 FINDINGS: Endotracheal tube and NG tube in stable position. Cardiomegaly. No pulmonary venous congestion. Mild atelectasis right lung base. Prominent skin fold noted on the left. No pneumothorax identified. IMPRESSION: 1.  Stable cardiomegaly. 2.  Mild atelectasis right lung base. Electronically Signed   By: Maisie Fus  Register   On: 09/10/2020 05:49   EEG adult  Result Date: 09/09/2020 Charlsie Quest, MD     09/09/2020  5:54 PM Patient Name: JHORDAN MCKIBBEN MRN: [id number] Epilepsy Attending: Charlsie Quest Referring Physician/Provider: Dr Brooke Dare Date: 09/09/2020 Duration: 21.26 mins Patient history: 60yo M with multiple seizures. EEG to evaluate for seizures Level of alertness:  Comatose/sedated AEDs during EEG study: Versed, propofol, LEV Technical aspects: This EEG study was done with scalp electrodes  positioned according to the 10-20 International system of electrode placement. Electrical activity was acquired at a sampling rate of 500Hz  and reviewed with a high frequency filter of 70Hz  and a low frequency filter of 1Hz . EEG data were recorded continuously and digitally stored. Description: EEG showed continuous generalized 3 to 6 Hz theta-delta slowing admixed with 15-18Hz  generalized, maximal frontocentral region beta activity. Hyperventilation and photic stimulation were not performed.   ABNORMALITY - Continuous slow, generalized IMPRESSION: This study is suggestive of moderate diffuse encephalopathy, nonspecific etiology but likely related to sedation. No seizures or epileptiform discharges were seen throughout the recording. Priyanka   Overnight EEG with video  Result Date: 09/10/2020 , MD     09/10/2020  2:41 PM Patient Name: AKIEL FENNELL MRN: Charlsie Quest Epilepsy Attending: 11/10/2020 Referring Physician/Provider: Dr [name] Duration: 09/09/2020 2026 to 09/10/2020 1235  Patient history: 59yo M with multiple seizures. EEG to evaluate for seizures  Level of alertness:  Comatose/sedated  AEDs during EEG study: Versed, propofol, LEV  Technical aspects: This EEG study was done with scalp electrodes positioned according to the 10-20 International system of electrode placement. Electrical activity was acquired at a sampling rate of 500Hz  and reviewed with a high frequency filter of 70Hz  and a low frequency filter of 1Hz . EEG data were recorded continuously and digitally stored.  Description: EEG showed continuous generalized and maximal right frontotemporal region 3 to 6 Hz theta-delta slowing admixed with 15-18Hz  generalized, maximal frontocentral region beta activity. Hyperventilation and photic stimulation were not performed. Patient event button was pressed on 09/10/2020 at 0013.  Per RN, patient had right shoulder rhythmic movements which were difficult to visualize on  camera.  Concomitant EEG before, during and after the event did not show any EEG change to suggest seizure activity Patient event button was pressed on 09/10/2020 at 0608 for chewing movements. Concomitant EEG before, during and after the event did not show any EEG change to suggest seizure activity  ABNORMALITY - Continuous slow, generalized and maximal right frontotemporal region  IMPRESSION: This study is suggestive of cortical dysfunction in right frontotemporal region likely secondary to underlying infarct. Additionally there is evidence of moderate diffuse encephalopathy, nonspecific etiology but likely related to sedation. No seizures or epileptiform discharges were  seen throughout the recording. Two events were reported as described above without concomitant EEG change and were most likely NOT epileptic.  Priyanka Nicanor Bake Yadav    Aaden Buckman, MD  [facility] 09/11/2020  If 7PM-7AM, please contact night-coverage

## 2020-09-11 NOTE — Progress Notes (Signed)
Initial Nutrition Assessment  DOCUMENTATION CODES:  Severe malnutrition in context of chronic illness  INTERVENTION:  Continue current diet order.  Add Ensure Enlive po TID, each supplement provides 350 kcal and 20 grams of protein.  Add Magic cup TID with meals, each supplement provides 290 kcal and 9 grams of protein.  Add MVI with minerals daily.  NUTRITION DIAGNOSIS:  Severe Malnutrition related to chronic illness as evidenced by severe fat depletion,severe muscle depletion.  GOAL:  Patient will meet greater than or equal to 90% of their needs  MONITOR:  PO intake,Supplement acceptance,Labs,Weight trends,Skin,I & O's  REASON FOR ASSESSMENT:  Malnutrition Screening Tool    ASSESSMENT:  60 y.o. male with a PMH of HTN, GERD, heart failure exacerbation, seizures (last seizure was approximately 2 years ago), CKD 3, HLD, COPD, CVA about 2.5 years ago, and EtOH/tobacco abuse who presents with breakthrough seizure with concern for status epilepticus in the setting of EtOH use versus medication nonadherence and acute hypercapnic respiratory failure. 5/5 - seizures resolved per neuro  Spoke with pt at bedside. Pt kept reassuring me that he is fine and does not need anything from me. Denied answering RD's questions. Per Epic, pt ate 100% of his dinner last night.  Per Epic, for the past two months, pt's weight has fluctuated consistently around 59-62 kg. On exam, pt has severe depletions, indicating severe malnutrition.  Per previous RD note from prior admission at [facility], pt likely does not eat well given EtOH abuse and eats well in the hospital (100%). Pt also reports that he liked strawberry Ensure in this note.  Recommend Ensure TID, Magic Cup TID, and MVI with minerals daily.  Medications: folic acid, Keppra, Protonix, thiamine tablet Labs: reviewed; serum Ca 8.2  NUTRITION - FOCUSED PHYSICAL EXAM: Flowsheet Row Most Recent Value  Orbital Region Severe depletion  Upper Arm  Region Severe depletion  Thoracic and Lumbar Region Severe depletion  Buccal Region Severe depletion  Temple Region Severe depletion  Clavicle Bone Region Severe depletion  Clavicle and Acromion Bone Region Severe depletion  Scapular Bone Region Severe depletion  Dorsal Hand Severe depletion  Patellar Region Severe depletion  Anterior Thigh Region Severe depletion  Posterior Calf Region Severe depletion  Edema (RD Assessment) None  Hair Reviewed  Eyes Reviewed  Mouth Reviewed  Skin Reviewed  Nails Reviewed     Diet Order:   Diet Order            Diet regular Room service appropriate? Yes with Assist; Fluid consistency: Thin  Diet effective now                EDUCATION NEEDS:  Education needs have been addressed  Skin:  Skin Assessment:  (Ecchymosis, abrasions)  Last BM:  PTA/unknown  Height:  Ht Readings from Last 1 Encounters:  09/09/20 5\' 8"  (1.727 m)   Weight:  Wt Readings from Last 1 Encounters:  09/10/20 59.3 kg   Ideal Body Weight:  67.2 kg  BMI:  Body mass index is 19.88 kg/m.  Estimated Nutritional Needs:  Kcal:  2000-2200 Protein:  100-115 grams Fluid:  >2 L  11/10/20, RD, LDN Registered Dietitian After Hours/Weekend Pager # in Amion

## 2020-09-11 NOTE — Progress Notes (Signed)
Patient requests that I return the patients wallet and money from security to the patient. I informed the patient that having valuable belongs stored with security prevents the items from going missing and was safer than sitting in the patients room and that items are typically returned to the patient at the point of discharge. The patient understood my reasoning, but requested to have his belongings returned to him anyhow.

## 2020-09-11 NOTE — Plan of Care (Signed)
  Problem: Coping: Goal: Ability to adjust to condition or change in health will improve Outcome: Progressing   Problem: Health Behavior/Discharge Planning: Goal: Compliance with prescribed medication regimen will improve Outcome: Progressing   Problem: Medication: Goal: Risk for medication side effects will decrease Outcome: Progressing   Problem: Clinical Measurements: Goal: Complications related to the disease process, condition or treatment will be avoided or minimized Outcome: Progressing   Problem: Safety: Goal: Verbalization of understanding the information provided will improve Outcome: Progressing   Problem: Self-Concept: Goal: Level of anxiety will decrease Outcome: Progressing

## 2020-09-11 NOTE — Discharge Instructions (Signed)
Nutrition Post Hospital Stay °Proper nutrition can help your body recover from illness and injury.   °Foods and beverages high in protein, vitamins, and minerals help rebuild muscle loss, promote healing, & reduce fall risk.  ° °In addition to eating healthy foods, a nutrition shake is an easy, delicious way to get the nutrition you need during and after your hospital stay ° °It is recommended that you continue to drink 3 bottles per day of: Ensure for at least 1 month (30 days) after your hospital stay  ° °Tips for adding a nutrition shake into your routine: °As allowed, drink one with vitamins or medications instead of water or juice °Enjoy one as a tasty mid-morning or afternoon snack °Drink cold or make a milkshake out of it °Drink one instead of milk with cereal or snacks °Use as a coffee creamer °  °Available at the following grocery stores and pharmacies:           °* [employer] * [employer] * [employer]  °* [employer]          * [employer] * [employer]  °* [facility]      * [employer]  * [employer]   °* [facility]  * [employer]   °* [facility] [phone]  °          °For COUPONS visit: [email or web address] or [email or web address]  ° °Suggested Substitutions °Ensure Plus = Boost Plus = Carnation Breakfast Essentials = Boost Compact °Ensure Active Clear = Boost Breeze °Glucerna Shake = Boost Glucose Control = Carnation Breakfast Essentials SUGAR FREE ° °Suggestions For Increasing Calories And Protein °Several small meals a day are easier to eat and digest than three large ones. Space meals about 2 to 3 hours apart to maximize comfort. °Stop eating 2 to 3 hours before bed and sleep with your head elevated if gastric reflux (GERD) and heartburn are problems. °Do not eat your favorite foods if you are feeling bad. Save them for when you feel good! °Eat breakfast-type foods at any meal. Eggs are usually easy to eat and are great any time of the day. (The same goes for pancakes and waffles.) °Eat when you  feel hungry. Most people have the greatest appetite in the morning because they have not eaten all night. If this is the best meal for you, then pile on those calories and other nutrients in the morning and at lunch. Then you can have a smaller dinner without losing total calories for the day. °Eat leftovers or nutritious snacks in the afternoon and early evening to round out your day. °Try homemade or commercially prepared nutrition bars and puddings, as well as calorie- and protein-rich liquid nutritional supplements. °Benefits of Physical Activity °Talk to your doctor about physical activity. Light or moderate physical activity can help maintain muscle and promote an appetite. Walking in the neighborhood or the local mall is a great way to get up, get out, and get moving. If you are unsteady on your feet, try walking around the dining room table. °Save Room for Calorie-Rich Food! °Drink most fluids between meals instead of with meals. (It is fine to have a sip to help swallow food at meal time.) Fluids (which usually have fewer calories and nutrients than solid food) can take up valuable space in your stomach. ° °Foods Recommended °High-Protein Foods °Milk products Add cheese to toast, crackers, sandwiches, baked potatoes, vegetables, soups, noodles, meat, and fruit. Use reduced-fat (2%) or whole milk in place of water   when cooking cereal and cream soups. Include cream sauces on vegetables and pasta. Add powdered milk to cream soups and mashed potatoes.  °Eggs Have hard-cooked eggs readily available in the refrigerator. Chop and add to salads, casseroles, soups, and vegetables. Make a quick egg salad. All eggs should be well cooked to avoid the risk of harmful bacteria.  °Meats, poultry, and fish Add leftover cooked meats to soups, casseroles, salads, and omelets. Make dip by mixing diced, chopped, or shredded meat with sour cream and spices.  °Beans, legumes, nuts, and seeds Sprinkle nuts and seeds on cereals,  fruit, and desserts such as ice cream, pudding, and custard. Also serve nuts and seeds on vegetables, salads, and pasta. Spread peanut butter on toast, bread, English muffins, and fruit, or blend it in a milk shake. Add beans and peas to salads, soups, casseroles, and vegetable dishes.  °High-Calorie Foods °Butter, margarine, and  oils Melt butter or margarine over potatoes, rice, pasta, and cooked vegetables. Add melted butter or margarine into soups and casseroles and spread on bread for sandwiches before spreading sandwich spread or peanut butter. Sauté or stir-fry vegetables, meats, chicken and fish such as shrimp/scallops in olive or canola oil. A variety of oils add calories and can be used to marinate meat, chicken, or fish.  °Milk products Add whipping cream to desserts, pancakes, waffles, fruit, and hot chocolate, and fold it into soups and casseroles. Add sour cream to baked potatoes and vegetables.  °Salad dressing Use regular (not low-fat or diet) mayonnaise and salad dressing on sandwiches and in dips with vegetables and fruit.   °Sweets Add jelly and honey to bread and crackers. Add jam to fruit and ice cream and as a topping over cake.  ° °Copyright 2020 © [employer]. All rights reserved.  °

## 2020-09-12 LAB — CULTURE, BLOOD (ROUTINE X 2): Special Requests: ADEQUATE

## 2020-09-12 LAB — CBC
HCT: 37.3 % — ABNORMAL LOW (ref 39.0–52.0)
Hemoglobin: 11.3 g/dL — ABNORMAL LOW (ref 13.0–17.0)
MCH: 31 pg (ref 26.0–34.0)
MCHC: 30.3 g/dL (ref 30.0–36.0)
MCV: 102.2 fL — ABNORMAL HIGH (ref 80.0–100.0)
Platelets: 159 10*3/uL (ref 150–400)
RBC: 3.65 MIL/uL — ABNORMAL LOW (ref 4.22–5.81)
RDW: 13 % (ref 11.5–15.5)
WBC: 12.2 10*3/uL — ABNORMAL HIGH (ref 4.0–10.5)
nRBC: 0 % (ref 0.0–0.2)

## 2020-09-12 LAB — BASIC METABOLIC PANEL
Anion gap: 4 — ABNORMAL LOW (ref 5–15)
BUN: 23 mg/dL — ABNORMAL HIGH (ref 6–20)
CO2: 32 mmol/L (ref 22–32)
Calcium: 8.9 mg/dL (ref 8.9–10.3)
Chloride: 109 mmol/L (ref 98–111)
Creatinine, Ser: 1.1 mg/dL (ref 0.61–1.24)
GFR, Estimated: >60 mL/min (ref >60)
Glucose, Bld: 90 mg/dL (ref 70–99)
Potassium: 4.3 mmol/L (ref 3.5–5.1)
Sodium: 145 mmol/L (ref 135–145)

## 2020-09-12 LAB — MAGNESIUM: Magnesium: 1.8 mg/dL (ref 1.7–2.4)

## 2020-09-12 MED ORDER — CARVEDILOL 25 MG PO TABS: 25.0000 mg | ORAL_TABLET | Freq: Two times a day (BID) | ORAL | 2 refills | Status: DC

## 2020-09-12 MED ORDER — PREDNISONE 10 MG PO TABS: ORAL_TABLET | ORAL | 0 refills | Status: DC

## 2020-09-12 MED ORDER — LEVETIRACETAM 750 MG PO TABS: 750.0000 mg | ORAL_TABLET | Freq: Two times a day (BID) | ORAL | 2 refills | Status: DC

## 2020-09-12 NOTE — Discharge Summary (Signed)
Physician Discharge Summary  Darin Hopkins WJX:[id number] DOB: August 11, 1960 DOA: 09/09/2020  PCP: Martie Round, NP  Admit date: 09/09/2020 Discharge date: 09/12/2020  Admitted From: Home  Discharge disposition: Home with [facility]  Recommendations for Outpatient Follow-Up:   . Follow up with your primary care provider in one week.  . Check CBC, BMP, magnesium in the next visit. [facility] has recommended outpatient follow-up with neurology at [facility] in about 12 weeks.    Discharge Diagnosis:   Principal Problem:   Acute hypercapnic respiratory failure (HCC) Active Problems:   Acute exacerbation of CHF (congestive heart failure) (HCC)   Essential hypertension   History of seizure   COPD (chronic obstructive pulmonary disease) (HCC)   Hyperlipidemia   GERD (gastroesophageal reflux disease)   Alcohol abuse   Status epilepticus (HCC)  Discharge Condition: Improved.  Diet recommendation: Low sodium, heart healthy.    Wound care: None.  Code status: Full.   History of Present Illness:   60 year old male with past medical history significant for hypertension, hyperlipidemia, heart failure with preserved ejection fraction, CVA, seizures noncompliant on medication, alcohol abuse, COPD on home oxygen noncompliant with, CKD stage III presented to hospital at [facility] with episodes of seizures.  Patient was recently admitted to hospital from 4/22 to 4/26 for presumed acute exacerbation of COPD.  Patient had 1 hour of seizure activity prior to coming to the hospital and was intubated for airway protection.  Patient was then transferred to [facility] for further care including LTM EEG and management of status epilepticus..  CT head scan was negative for acute findings.    He was stabilized in the ICU and was subsequently considered stable for transfer out of the ICU.  Hospital Course:   Following conditions were addressed during hospitalization as  listed below,  Breakthrough seizurewith concern for status epilepticus in the setting of EtOH use versus medication non-adherence, PriorR MCA territory stroke  Patient was initially intubated for airway protection.  Neurology was consulted.  On Keppra 750mg  twice daily at this time and will be continued on this dose on discharge.  Continuous EEG was performed which was negative for active seizures.  Neurology has recommended outpatient follow-up with neurology at [facility] in about 12 weeks.    Acutehypoxic/hypercapnic respiratory failure on the background of status epilepticus and underlying COPD.  Patient was initially intubated for hypoxia/ hypercapnia and for airway protection.  Status post extubation and currently on oxygen by nasal cannula at his baseline requirement. Continue Pulmicort inhalers, albuterol inhaler. Patient was advised compliance with his oxygen regimen at home.  Mild leukocytosis resolved. Reactive.  Essential hypertension, Chronic HFpEF Continue amlodipine, lisinopril, doxazosin, coreg on discharge.  Remained compensated.  Blood pressure is controlled.  Acute kidney injury onCKD stage IIIa Creatinine at baseline at this time. Lab Results  Component Value Date   CREATININE 1.35 (H) 09/14/2020   CREATININE 1.10 09/12/2020   CREATININE 1.13 09/10/2020    History of EtOH abuse No signs of withdrawal symptoms. Continue thiamine, folic acid on discharge. .  Hypertriglyceridemia.    Improved on repeat testing.  Deconditioning, debility, patient states that he lives at home and has a walker for ambulation.    Physical therapy was consulted and recommended home health PT, OT on discharge but family concerned about discharge  Disposition.   At this time, patient is stable for disposition home with home health PT, OT.  Need to follow-up with his primary care physician as  outpatient in 1 week.  Medical Consultants:    Neurology  Procedures:    EEG,  intubation and mechanical ventilation  Subjective:   Today, patient was seen and examined at bedside.  Patient denies interval complaints.  Denies chest pain, shortness of breath, fever   Discharge Exam:   Vitals:   09/12/20 0837 09/12/20 0845  BP: (!) 116/94   Pulse: 92 92  Resp: 18 18  Temp: 97.8 F (36.6 C)   SpO2: (!) 87%    Vitals:   09/12/20 0230 09/12/20 0447 09/12/20 0837 09/12/20 0845  BP: 130/86 130/82 (!) 116/94   Pulse: 65 66 92 92  Resp: Temp: 98.6 F (37 C) 97.7 F (36.5 C) 97.8 F (36.6 C)   TempSrc: Oral Oral Oral   SpO2: 100% 96% (!) 87%   Weight:       General: Alert awake, not in obvious distress, on nasal cannula oxygen, HENT: pupils equally reacting to light,  No scleral pallor or icterus noted. Oral mucosa is moist.  Chest:    Diminished breath sounds bilaterally. No crackles or wheezes.  CVS: S1 &S2 heard. No murmur.  Regular rate and rhythm. Abdomen: Soft, nontender, nondistended.  Bowel sounds are heard.   Extremities: No cyanosis, clubbing or edema.  Peripheral pulses are palpable. Psych: Alert, awake and oriented, normal mood CNS:  No cranial nerve deficits.  Power equal in all extremities.   Skin: Warm and dry.  No rashes noted.  The results of significant diagnostics from this hospitalization (including imaging, microbiology, ancillary and laboratory) are listed below for reference.     Diagnostic Studies:   CT Head Wo Contrast  Result Date: 09/09/2020 CLINICAL DATA:  Pt brought in via EMS from home for seizure like activity. Family states pt seized approx 1 hour prior to EMS. EMS reports pt seizing on arrival. EXAM: CT HEAD WITHOUT CONTRAST TECHNIQUE: Contiguous axial images were obtained from the base of the skull through the vertex without intravenous contrast. COMPARISON:  08/27/2020 FINDINGS: Brain: No evidence of acute infarction, hemorrhage, hydrocephalus, extra-axial collection or mass lesion/mass effect. Encephalomalacia  walls lateral right frontal lobe and right basal and external capsule/insular ribbon consistent with an old infarct, stable from the prior head CT. Mild ex vacuo dilation of the right lateral ventricle. Small old lacunar infarct in the left basal ganglia. Additional areas of patchy white matter hypoattenuation also noted in stable consistent moderate chronic microvascular ischemic change. Vascular: No hyperdense vessel or unexpected calcification. Skull: Normal. Negative for fracture or focal lesion. Sinuses/Orbits: Globes and orbits are unremarkable. Mild ethmoid and minor inferior frontal maxillary sinus mucosal thickening. Other: None. IMPRESSION: 1. No acute intracranial abnormalities. 2. 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 Electronically Signed   By: Amie Portland M.D.   On: 09/09/2020 14:45   DG Chest Port 1 View  Result Date: 09/10/2020 CLINICAL DATA:  COPD. EXAM: PORTABLE CHEST 1 VIEW COMPARISON:  09/09/2020 FINDINGS: Endotracheal tube and NG tube in stable position. Cardiomegaly. No pulmonary venous congestion. Mild atelectasis right lung base. Prominent skin fold noted on the left. No pneumothorax identified. IMPRESSION: 1.  Stable cardiomegaly. 2.  Mild atelectasis right lung base. Electronically Signed   By: Maisie Fus  Register   On: 09/10/2020 05:49   DG Chest Portable 1 View  Result Date: 09/09/2020 CLINICAL DATA:  Seizures. EXAM: PORTABLE CHEST 1 VIEW COMPARISON:  08/27/2020. FINDINGS: Endotracheal  to and NG tube noted good anatomic position. Heart size normal. No focal infiltrate. No pleural effusion. Costophrenic angles incompletely imaged. No pneumothorax. Carotid vascular calcification. IMPRESSION: 1.  Endotracheal tube and NG tube in stable position. 2.  No acute cardiopulmonary disease. 3.  Carotid vascular disease. Electronically Signed   By: Maisie Fushomas  Register   On: 09/09/2020 12:52    EEG adult  Result Date: 09/09/2020 Charlsie QuestYadav, Priyanka O, MD     09/09/2020  5:54 PM Patient Name: Darin Hopkins MRN: [id number] Epilepsy Attending: Charlsie QuestPriyanka O Yadav Referring Physician/Provider: Dr Brooke DareSrishti Bhagat Date: 09/09/2020 Duration: 21.26 mins Patient history: 60yo M with multiple seizures. EEG to evaluate for seizures Level of alertness:  Comatose/sedated AEDs during EEG study: Versed, propofol, LEV Technical aspects: This EEG study was done with scalp electrodes positioned according to the 10-20 International system of electrode placement. Electrical activity was acquired at a sampling rate of 500Hz  and reviewed with a high frequency filter of 70Hz  and a low frequency filter of 1Hz . EEG data were recorded continuously and digitally stored. Description: EEG showed continuous generalized 3 to 6 Hz theta-delta slowing admixed with 15-18Hz  generalized, maximal frontocentral region beta activity. Hyperventilation and photic stimulation were not performed.   ABNORMALITY - Continuous slow, generalized IMPRESSION: This study is suggestive of moderate diffuse encephalopathy, nonspecific etiology but likely related to sedation. No seizures or epileptiform discharges were seen throughout the recording. Priyanka Annabelle Harman Yadav   Overnight EEG with video  Result Date: 09/10/2020 Charlsie QuestYadav, Priyanka O, MD     09/10/2020  2:41 PM Patient Name: Darin Hopkins MRN: [id number] Epilepsy Attending: Charlsie QuestPriyanka O Yadav Referring Physician/Provider: Dr Milon DikesAshish Arora Duration: 09/09/2020 2026 to 09/10/2020 1235  Patient history: 59yo M with multiple seizures. EEG to evaluate for seizures  Level of alertness:  Comatose/sedated  AEDs during EEG study: Versed, propofol, LEV  Technical aspects: This EEG study was done with scalp electrodes positioned according to the 10-20 International system of electrode placement. Electrical activity was acquired at a sampling rate of 500Hz  and reviewed with a high frequency filter of 70Hz  and a low frequency  filter of 1Hz . EEG data were recorded continuously and digitally stored.  Description: EEG showed continuous generalized and maximal right frontotemporal region 3 to 6 Hz theta-delta slowing admixed with 15-18Hz  generalized, maximal frontocentral region beta activity. Hyperventilation and photic stimulation were not performed. Patient event button was pressed on 09/10/2020 at 0013.  Per RN, patient had right shoulder rhythmic movements which were difficult to visualize on camera.  Concomitant EEG before, during and after the event did not show any EEG change to suggest seizure activity Patient event button was pressed on 09/10/2020 at 0608 for chewing movements. Concomitant EEG before, during and after the event did not show any EEG change to suggest seizure activity  ABNORMALITY - Continuous slow, generalized and maximal right frontotemporal region  IMPRESSION: This study is suggestive of cortical dysfunction in right frontotemporal region likely secondary to underlying infarct. Additionally there is evidence of moderate diffuse encephalopathy, nonspecific etiology but likely related to sedation. No seizures or epileptiform discharges were seen throughout the recording. Two events were reported as described above without concomitant EEG change and were most likely NOT epileptic.  Charlsie Questriyanka O Yadav     Labs:   Basic Metabolic Panel: Recent Labs  Lab 09/09/20 1152 09/10/20 0337 09/10/20 0452 09/12/20 0150  NA 145 146* 138 145  K 4.2 3.4* 5.1 4.3  CL 107  --  108 109  CO2 30  --  21* 32  GLUCOSE 84  --  75 90  BUN 36*  --  30* 23*  CREATININE 1.38*  --  1.13 1.10  CALCIUM 8.7*  --  8.2* 8.9  MG 2.0  --  2.1 1.8   GFR Estimated Creatinine Clearance: 60.6 mL/min (by C-G formula based on SCr of 1.1 mg/dL). Liver Function Tests: Recent Labs  Lab 09/09/20 1152  AST 25  ALT 24  ALKPHOS 36*  BILITOT 0.6  PROT 6.9  ALBUMIN 3.8   No results for input(s): LIPASE, AMYLASE in the last 168  hours. No results for input(s): AMMONIA in the last 168 hours. Coagulation profile No results for input(s): INR, PROTIME in the last 168 hours.  CBC: Recent Labs  Lab 09/09/20 1152 09/10/20 0337 09/10/20 0452 09/12/20 0150  WBC 12.2*  --  14.6* 12.2*  NEUTROABS 5.1  --   --   --   HGB 11.4* 12.2* 12.8* 11.3*  HCT 37.4* 36.0* 39.2 37.3*  MCV 102.2*  --  100.3* 102.2*  PLT 183  --  114* 159   Cardiac Enzymes: No results for input(s): CKTOTAL, CKMB, CKMBINDEX, TROPONINI in the last 168 hours. BNP: Invalid input(s): POCBNP CBG: Recent Labs  Lab 09/09/20 1202  GLUCAP 79   D-Dimer No results for input(s): DDIMER in the last 72 hours. Hgb A1c No results for input(s): HGBA1C in the last 72 hours. Lipid Profile Recent Labs    09/10/20 0452 09/10/20 0835  TRIG 1,252* 117   Thyroid function studies No results for input(s): TSH, T4TOTAL, T3FREE, THYROIDAB in the last 72 hours.  Invalid input(s): FREET3 Anemia work up No results for input(s): VITAMINB12, FOLATE, FERRITIN, TIBC, IRON, RETICCTPCT in the last 72 hours. Microbiology Recent Results (from the past 240 hour(s))  Resp Panel by RT-PCR (Flu A&B, Covid) Nasopharyngeal Swab     Status: None   Collection Time: 09/09/20  1:46 PM   Specimen: Nasopharyngeal Swab; Nasopharyngeal(NP) swabs in vial transport medium  Result Value Ref Range Status   SARS Coronavirus 2 by RT PCR NEGATIVE NEGATIVE Final    Comment: (NOTE) SARS-CoV-2 target nucleic acids are NOT DETECTED.  The SARS-CoV-2 RNA is generally detectable in upper respiratory specimens during the acute phase of infection. The lowest concentration of SARS-CoV-2 viral copies this assay can detect is 138 copies/mL. A negative result does not preclude SARS-Cov-2 infection and should not be used as the sole basis for treatment or other patient management decisions. A negative result may occur with  improper specimen collection/handling, submission of specimen  other than nasopharyngeal swab, presence of viral mutation(s) within the areas targeted by this assay, and inadequate number of viral copies(<138 copies/mL). A negative result must be combined with clinical observations, patient history, and epidemiological information. The expected result is Negative.  Fact Sheet for Patients:  BloggerCourse.com  Fact Sheet for Healthcare Providers:  SeriousBroker.it  This test is no t yet approved or cleared by the [place] FDA and  has been authorized for detection and/or diagnosis of SARS-CoV-2 by FDA under an Emergency Use Authorization (EUA). This EUA will remain  in effect (meaning this test can be used) for the duration of the COVID-19 declaration under Section 564(b)(1) of the Act, 21 U.S.C.section 360bbb-3(b)(1), unless the authorization is terminated  or revoked sooner.       Influenza A by PCR NEGATIVE NEGATIVE Final   Influenza B by PCR NEGATIVE NEGATIVE Final    Comment: (NOTE) The Xpert Xpress SARS-CoV-2/FLU/RSV plus assay is intended  as an aid in the diagnosis of influenza from Nasopharyngeal swab specimens and should not be used as a sole basis for treatment. Nasal washings and aspirates are unacceptable for Xpert Xpress SARS-CoV-2/FLU/RSV testing.  Fact Sheet for Patients: BloggerCourse.com  Fact Sheet for Healthcare Providers: SeriousBroker.it  This test is not yet approved or cleared by the [place] FDA and has been authorized for detection and/or diagnosis of SARS-CoV-2 by FDA under an Emergency Use Authorization (EUA). This EUA will remain in effect (meaning this test can be used) for the duration of the COVID-19 declaration under Section 564(b)(1) of the Act, 21 U.S.C. section 360bbb-3(b)(1), unless the authorization is terminated or revoked.  Performed at [facility], [address]  [place], [place] [address]   Blood culture (routine x 2)     Status: None (Preliminary result)   Collection Time: 09/09/20  1:46 PM   Specimen: BLOOD  Result Value Ref Range Status   Specimen Description BLOOD BLOOD LEFT FOREARM  Final   Special Requests   Final    BOTTLES DRAWN AEROBIC AND ANAEROBIC Blood Culture adequate volume   Culture   Final    NO GROWTH 3 DAYS Performed at [facility], [address] [place], [place] [address]    Report Status PENDING  Incomplete  Blood culture (routine x 2)     Status: Abnormal   Collection Time: 09/09/20  1:46 PM   Specimen: BLOOD  Result Value Ref Range Status   Specimen Description   Final    BLOOD LEFT ANTECUBITAL Performed at [facility], [address], [place], [place] [address]    Special Requests   Final    BOTTLES DRAWN AEROBIC AND ANAEROBIC Blood Culture adequate volume Performed at [facility], [address], [place], [place] [address]    Culture  Setup Time   Final    Organism ID to follow GRAM POSITIVE COCCI AEROBIC BOTTLE ONLY CRITICAL RESULT CALLED TO, READ BACK BY AND VERIFIED WITH: Viviano Simas RN  09/10/20 SCS Performed at [facility], [address] [place], [place] [address]    Culture (A)  Final    STAPHYLOCOCCUS EPIDERMIDIS THE SIGNIFICANCE OF ISOLATING THIS ORGANISM FROM A SINGLE SET OF BLOOD CULTURES WHEN MULTIPLE SETS ARE DRAWN IS UNCERTAIN. PLEASE NOTIFY THE MICROBIOLOGY DEPARTMENT WITHIN ONE WEEK IF SPECIATION AND SENSITIVITIES ARE REQUIRED. Performed at [facility], [address], [place], [place] [address]    Report Status 09/12/2020 FINAL  Final  Blood Culture ID Panel (Reflexed)     Status: Abnormal   Collection Time: 09/09/20  1:46 PM  Result Value Ref Range Status   Enterococcus faecalis NOT DETECTED NOT DETECTED Final   Enterococcus Faecium NOT DETECTED NOT DETECTED Final   Listeria monocytogenes NOT DETECTED NOT DETECTED Final   Staphylococcus  species DETECTED (A) NOT DETECTED Final    Comment: CRITICAL RESULT CALLED TO, READ BACK BY AND VERIFIED WITH: Viviano Simas RN  09/10/20 SCS    Staphylococcus aureus (BCID) NOT DETECTED NOT DETECTED Final   Staphylococcus epidermidis DETECTED (A) NOT DETECTED Final    Comment: CRITICAL RESULT CALLED TO, READ BACK BY AND VERIFIED WITH: Viviano Simas RN  09/10/20 SCS    Staphylococcus lugdunensis NOT DETECTED NOT DETECTED Final   Streptococcus species NOT DETECTED NOT DETECTED Final   Streptococcus agalactiae NOT DETECTED NOT DETECTED Final   Streptococcus pneumoniae NOT DETECTED NOT DETECTED Final   Streptococcus pyogenes NOT DETECTED NOT DETECTED Final  A.calcoaceticus-baumannii NOT DETECTED NOT DETECTED Final   Bacteroides fragilis NOT DETECTED NOT DETECTED Final   Enterobacterales NOT DETECTED NOT DETECTED Final   Enterobacter cloacae complex NOT DETECTED NOT DETECTED Final   Escherichia coli NOT DETECTED NOT DETECTED Final   Klebsiella aerogenes NOT DETECTED NOT DETECTED Final   Klebsiella oxytoca NOT DETECTED NOT DETECTED Final   Klebsiella pneumoniae NOT DETECTED NOT DETECTED Final   Proteus species NOT DETECTED NOT DETECTED Final   Salmonella species NOT DETECTED NOT DETECTED Final   Serratia marcescens NOT DETECTED NOT DETECTED Final   Haemophilus influenzae NOT DETECTED NOT DETECTED Final   Neisseria meningitidis NOT DETECTED NOT DETECTED Final   Pseudomonas aeruginosa NOT DETECTED NOT DETECTED Final   Stenotrophomonas maltophilia NOT DETECTED NOT DETECTED Final   Candida albicans NOT DETECTED NOT DETECTED Final   Candida auris NOT DETECTED NOT DETECTED Final   Candida glabrata NOT DETECTED NOT DETECTED Final   Candida krusei NOT DETECTED NOT DETECTED Final   Candida parapsilosis NOT DETECTED NOT DETECTED Final   Candida tropicalis NOT DETECTED NOT DETECTED Final   Cryptococcus neoformans/gattii NOT DETECTED NOT DETECTED Final   Methicillin resistance mecA/C NOT DETECTED  NOT DETECTED Final    Comment: Performed at [facility] [address] [place], [place] [address]     Discharge Instructions:   Discharge Instructions    Diet general   Complete by: As directed    Discharge instructions   Complete by: As directed    Follow-up with your primary care physician in 1 week.  Please continue to take seizure medication dose has been increased.  No driving.  Continue to use oxygen at home.   Increase activity slowly   Complete by: As directed      Allergies as of 09/12/2020   No Known Allergies     Medication List    TAKE these medications   amLODipine 10 MG tablet Commonly known as: NORVASC Take 10 mg by mouth every evening.   aspirin 81 MG EC tablet Take 81 mg by mouth in the morning.   atorvastatin 20 MG tablet Commonly known as: LIPITOR Take 20 mg by mouth every evening.   carvedilol 25 MG tablet Commonly known as: COREG Take 1 tablet (25 mg total) by mouth 2 (two) times daily with a meal. What changed:   how much to take  when to take this   doxazosin 2 MG tablet Commonly known as: CARDURA Take 2 mg by mouth every evening.   folic acid 1 MG tablet Commonly known as: FOLVITE Take 1 mg by mouth daily.   furosemide 20 MG tablet Commonly known as: LASIX Take 3 tablets (60 mg total) by mouth daily.   levETIRAcetam 750 MG tablet Commonly known as: KEPPRA Take 1 tablet (750 mg total) by mouth 2 (two) times daily. What changed:   medication strength  how much to take   lisinopril 5 MG tablet Commonly known as: ZESTRIL Take 5 mg by mouth daily.   Multivitamin Adult Tabs Take 2 tablets by mouth daily.   omeprazole 20 MG capsule Commonly known as: PRILOSEC Take 20 mg by mouth daily before breakfast.   predniSONE 10 MG tablet Commonly known as: DELTASONE Take 4 tablets (40 mg total) by mouth daily with breakfast for 2 days, THEN 3 tablets (30 mg total) daily with breakfast for 2 days, THEN 2 tablets (20 mg  total) daily with breakfast for 2 days, THEN 1 tablet (10 mg total) daily with breakfast  for 2 days. Start taking on: Sep 12, 2020 What changed: See the new instructions.   ProAir HFA 108 (90 Base) MCG/ACT inhaler Generic drug: albuterol Inhale 2 puffs into the lungs every 6 (six) hours as needed for shortness of breath or wheezing.   Spiriva HandiHaler 18 MCG inhalation capsule Generic drug: tiotropium Place 1 capsule into inhaler and inhale daily.   thiamine 100 MG tablet Take 1 tablet (100 mg total) by mouth daily.   Vitamin D-3 25 MCG (1000 UT) Caps Take 1,000 Units by mouth in the morning.       Follow-up Information    Martie Round, NP. Schedule an appointment as soon as possible for a visit in 1 week(s).   Specialty: Nurse Practitioner Why: regular followup Contact information: [address] [place] [address] [phone]        Care, [facility] Follow up.   Specialty: Home Health Services Why: A representative from [facility] will contact you to arrange start date and time for your therapy Contact information: [address] [place] [address] [phone]                Time coordinating discharge: 39 minutes  Signed:  Reinette Cuneo  [facility] 09/12/2020, 3:43 PM

## 2020-09-12 NOTE — Evaluation (Signed)
Clinical/Bedside Swallow Evaluation Patient Details  Name: Darin Hopkins MRN: [id number] Date of Birth: 1960-12-27  Today's Date: 09/12/2020 Time: SLP Start Time (ACUTE ONLY): 1514 SLP Stop Time (ACUTE ONLY): 1530 SLP Time Calculation (min) (ACUTE ONLY): 16 min  Past Medical History:  Past Medical History:  Diagnosis Date  . COPD (chronic obstructive pulmonary disease) (HCC)   . Hypertension   . Seizures (HCC)    Past Surgical History: No past surgical history on file. HPI:  60 y.o. male admitted on 09/09/20 for seizure for one hour at home (presented initially to [facility] ED).  He was intubated 5/3-54. 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 Pt was transferred to [facility] for LTM EEG (also negative for seizure) and further management of seizure.  Pt dx with breakthrough seizure with concern for status epilepticus in the setting of ETOH vs medication non-adherence.  Also dx with acute hypoxic/hypercapnic respiratory failure (underlying COPD), mild leukocytosis, AKI, hypertriglyceridemia (at risk for pancreatitis).  Pt with other significant PMH of HTN. BSE at [facility] 08/19/20 recommended regular diet/thin liquids.   Assessment / Plan / Recommendation Clinical Impression  Pt denies history of significant dysphagia following stroke although his baseline risk does appear increased. In addition, he has COPD, is currently alert and emerging from post ictal state and intubation (1 day). Other than mild left labial residue, pt  was not holding saliva, no wet vocal quality and did not cough with increased volumes water, puree or solids. Consumption of pack of crackers was rapid and needed cues to decrase rate. If he does not follow strategies he is at higher risk- reviewed safe precautions. Recommend continue regular/thin liquid, pills with thin. No further ST needed. SLP Visit Diagnosis: Dysphagia, unspecified (R13.10)    Aspiration Risk  Mild aspiration  risk    Diet Recommendation Regular;Thin liquid   Liquid Administration via: Cup;Straw Medication Administration: Whole meds with liquid Supervision: Patient able to self feed;Comment (assist to manage packages) Compensations: Slow rate;Small sips/bites;Lingual sweep for clearance of pocketing Postural Changes: Seated upright at 90 degrees    Other  Recommendations Oral Care Recommendations: Oral care BID   Follow up Recommendations None      Frequency and Duration            Prognosis        Swallow Study   General Date of Onset: 09/09/20 HPI: 60 y.o. male admitted on 09/09/20 for seizure for one hour at home (presented initially to [facility] ED).  He was intubated 5/3-54. 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 Pt was transferred to [facility] for LTM EEG (also negative for seizure) and further management of seizure.  Pt dx with breakthrough seizure with concern for status epilepticus in the setting of ETOH vs medication non-adherence.  Also dx with acute hypoxic/hypercapnic respiratory failure (underlying COPD), mild leukocytosis, AKI, hypertriglyceridemia (at risk for pancreatitis).  Pt with other significant PMH of HTN. BSE at [facility] 08/19/20 recommended regular diet/thin liquids. Type of Study: Bedside Swallow Evaluation Previous Swallow Assessment:  (see HPI) Diet Prior to this Study: Regular;Thin liquids Temperature Spikes Noted: No Respiratory Status: Nasal cannula History of Recent Intubation: Yes Length of Intubations (days): 1 days Date extubated: 09/10/20 Behavior/Cognition: Alert;Cooperative;Pleasant mood;Requires cueing Oral Cavity Assessment: Within Functional Limits Oral Care Completed by SLP: No Oral Cavity - Dentition: Missing dentition Vision: Functional for self-feeding Self-Feeding Abilities: Able to feed self;Needs  assist Patient Positioning: Upright in chair Baseline Vocal Quality: Normal Volitional Cough:  Strong Volitional Swallow: Able to elicit    Oral/Motor/Sensory Function Overall Oral Motor/Sensory Function: Mild impairment Facial ROM: Within Functional Limits Facial Symmetry: Within Functional Limits Lingual ROM: Within Functional Limits Lingual Symmetry: Abnormal symmetry left;Suspected CN XII (hypoglossal) dysfunction Velum: Within Functional Limits Mandible: Within Functional Limits   Ice Chips Ice chips: Not tested   Thin Liquid Thin Liquid:  (min oral delay x 1) Presentation: Cup;Straw    Nectar Thick Nectar Thick Liquid: Not tested   Honey Thick Honey Thick Liquid: Not tested   Puree Puree: Within functional limits   Solid     Solid:  (left labial residue)      Royce Macadamia 09/12/2020,4:07 PM   Breck Coons Lonell Face.Ed Nurse, children's [phone] Office [phone]

## 2020-09-12 NOTE — Evaluation (Signed)
Physical Therapy Evaluation Patient Details Name: Darin Hopkins MRN: [id number] DOB: [birth year] Today's Date: 09/12/2020   History of Present Illness  60 y.o. male admitted on 09/09/20 for seizure for one hour at home (presented initially to [facility] ED).  He was intubated in the ED.  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  EEG ordered showed moderate diffuse encephalopathy, but no epileptiform discharges.  Pt was transferred to [facility] for LTM EEG (also negative for seizure) and further management of seizure.  Pt dx with breakthrough seizure with concern for status epilepticus in the setting of ETOH vs medication non-adherence.  Also dx with acute hypoxic/hypercapnic respiratory failure (underlying COPD), mild leukocytosis, AKI, hypertriglyceridemia (at risk for pancreatitis).  Pt was extubated on 09/10/20.  Pt with other significant PMH of HTN.  Clinical Impression  Pt needed min guard assist with RW to ambulate around the room several times.  He definitely needed supplemental O2 (see separate O2 note) up to 4 L O2 [place] to maintain sats >90% during mobility.  He also had quite a gurgle-y/wet sounding voice quality, seemingly holding saliva in his mouth.  I asked MD about it and he ordered an SLP consult.  He is likely close to his physical baseline, but would benefit from some follow up home therapy to increase strength and safety as he is a very high fall risk.    Follow Up Recommendations Home health PT;Other (comment) (HHOT)    Equipment Recommendations  None recommended by PT    Recommendations for Other Services OT consult;Speech consult     Precautions / Restrictions Precautions Precautions: Fall Precaution Comments: left sided weakness      Mobility  Bed Mobility Overal bed mobility: Modified Independent             General bed mobility comments: slow, extra time needed, difficulty getting feet off of bed.    Transfers Overall  transfer level: Needs assistance Equipment used: Rolling walker (2 wheeled);None Transfers: Sit to/from Stand Sit to Stand: Min guard;Min assist         General transfer comment: Min guard assist for safety, min assist without AD  Ambulation/Gait Ambulation/Gait assistance: Min guard Gait Distance (Feet): 25 Feet (x3) Assistive device: Rolling walker (2 wheeled);1 person hand held assist Gait Pattern/deviations: Step-through pattern;Decreased dorsiflexion - left;Decreased step length - left     General Gait Details: Pt needed min hand held assist for gait, min guard assist with RW (much safer despite poor use of RW with cues).  Pt needed cues to stay inside of the RW especially while turning and get closer to RW during straight gait.  Stairs            Wheelchair Mobility    Modified Rankin (Stroke Patients Only)       Balance Overall balance assessment: Needs assistance Sitting-balance support: Feet supported;No upper extremity supported Sitting balance-Leahy Scale: Good     Standing balance support: Single extremity supported Standing balance-Leahy Scale: Poor Standing balance comment: needs at least one hand supported on walker to stand with urinal, bil lower legs supported on chair and min guard assit from PT.                             Pertinent Vitals/Pain Pain Assessment: No/denies pain    Home Living Family/patient expects to be discharged to:: Private residence Living Arrangements: Other relatives Available Help  at Discharge: Family;Available PRN/intermittently Type of Home: Mobile home Home Access: Stairs to enter Entrance Stairs-Rails: Right;Left;Can reach both Entrance Stairs-Number of Steps: 2 Home Layout: One level Home Equipment: Walker - 2 wheels;Cane - single point      Prior Function Level of Independence: Independent;Needs assistance   Gait / Transfers Assistance Needed: pt reports he uses O2 at home "4 pounds" and  "sometimes" uses the cane or the walker     Comments: Pt is an inconsistent historian, no family present to confirm, some answers match chart review, others do not.     Hand Dominance   Dominant Hand: Right    Extremity/Trunk Assessment   Upper Extremity Assessment Upper Extremity Assessment: Defer to OT evaluation LUE Deficits / Details: hx CVA with residual L sided deficits including decreased strength and coordination, but functionally, able to use on RW after mild difficulty to place hand on RW, some mild intattention noted on this side, but is likely baseline.    Lower Extremity Assessment Lower Extremity Assessment: RLE deficits/detail;LLE deficits/detail RLE Deficits / Details: right leg is mildly stronger than left leg strength 3+/5 per seated and supine strength testing. LLE Deficits / Details: left leg is mildly weaker than right leg with 3-/5 strength, no buckling noted with gait, but decreased foot clearance on this side and difficulty progressing foot forward. LLE Coordination: decreased gross motor    Cervical / Trunk Assessment Cervical / Trunk Assessment: Kyphotic Cervical / Trunk Exceptions: forward head rounded shoulders  Communication   Communication: Other (comment) (wet vocal quality.)  Cognition Arousal/Alertness: Awake/alert Behavior During Therapy: WFL for tasks assessed/performed Overall Cognitive Status: No family/caregiver present to determine baseline cognitive functioning                                 General Comments: decreased STM, decreased safety awareness.      General Comments General comments (skin integrity, edema, etc.): Pt definitely needs supplemental O2 at home.  He needed 4 L O2 [place] to maintain sats during mobility.  Pt also with very swollen scrotum and cannot report if this is new or chronic. Elevated scrotum on ice pack at end of session.    Exercises     Assessment/Plan    PT Assessment Patient needs continued  PT services  PT Problem List Decreased strength;Decreased activity tolerance;Decreased mobility;Decreased balance;Decreased safety awareness;Decreased knowledge of use of DME;Decreased cognition       PT Treatment Interventions DME instruction;Gait training;Stair training;Functional mobility training;Therapeutic activities;Therapeutic exercise;Balance training;Cognitive remediation;Neuromuscular re-education;Patient/family education    PT Goals (Current goals can be found in the Care Plan section)  Acute Rehab PT Goals Patient Stated Goal: to go home soon PT Goal Formulation: With patient Time For Goal Achievement: 09/26/20 Potential to Achieve Goals: Good    Frequency Min 3X/week   Barriers to discharge        Co-evaluation               AM-PAC PT "6 Clicks" Mobility  Outcome Measure Help needed turning from your back to your side while in a flat bed without using bedrails?: A Little Help needed moving from lying on your back to sitting on the side of a flat bed without using bedrails?: A Little Help needed moving to and from a bed to a chair (including a wheelchair)?: A Little Help needed standing up from a chair using your arms (e.g., wheelchair or bedside chair)?: A Little  Help needed to walk in hospital room?: A Little Help needed climbing 3-5 steps with a railing? : A Little 6 Click Score: 18    End of Session Equipment Utilized During Treatment: Gait belt;Oxygen Activity Tolerance: Patient limited by fatigue Patient left: in chair;with call bell/phone within reach;with chair alarm set Nurse Communication: Mobility status;Other (comment) (wet vocal quality) PT Visit Diagnosis: Unsteadiness on feet (R26.81);Muscle weakness (generalized) (M62.81);Difficulty in walking, not elsewhere classified (R26.2);Other symptoms and signs involving the nervous system (J82.505)    Time: 3976-7341 PT Time Calculation (min) (ACUTE ONLY): 42 min   Charges:   PT Evaluation $PT  Eval Moderate Complexity: 1 Mod PT Treatments $Gait Training: 8-22 mins $Therapeutic Activity: 8-22 mins        Corinna Capra, PT, DPT  Acute Rehabilitation [phone] pager [phone]) [phone] office

## 2020-09-12 NOTE — TOC Transition Note (Addendum)
Transition of Care [facility]) - CM/SW Discharge Note   Patient Details  Name: Darin Hopkins MRN: [id number] Date of Birth: 13-Feb-1961  Transition of Care [facility]) CM/SW Contact:  Durenda Guthrie, RN Phone Number: 09/12/2020, 2:03 PM   Clinical Narrative:   Patient states he has oxygen at home. Lives with sister. CM called Misty Stanley with [facility] for Home Health referral. No further needs identified.   2:16pm CM notified by Misty Stanley with [facility] that no staffing is available for patient's area. CM will try another agency.   Final next level of care: Home w Home Health Services Barriers to Discharge: No Barriers Identified   Patient Goals and CMS Choice        Discharge Placement                       Discharge Plan and Services                            [facility] Agency: [facility] Date [facility] Agency Contacted: 09/12/20 Time HH Agency Contacted: 1402 Representative spoke with at [facility] Agency: Nile Dear  Social Determinants of Health (SDOH) Interventions     Readmission Risk Interventions No flowsheet data found.

## 2020-09-12 NOTE — Progress Notes (Signed)
Occupational Therapy Treatment Patient Details Name: Darin Hopkins MRN: [id number] DOB: 17-Nov-1960 Today's Date: 09/12/2020    History of present illness 60 y.o. male admitted on 09/09/20 for seizure for one hour at home (presented initially to [facility] ED).  He was intubated in the ED.  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  EEG ordered showed moderate diffuse encephalopathy, but no epileptiform discharges.  Pt was transferred to [facility] for LTM EEG (also negative for seizure) and further management of seizure.  Pt dx with breakthrough seizure with concern for status epilepticus in the setting of ETOH vs medication non-adherence.  Also dx with acute hypoxic/hypercapnic respiratory failure (underlying COPD), mild leukocytosis, AKI, hypertriglyceridemia (at risk for pancreatitis).  Pt was extubated on 09/10/20.  Pt with other significant PMH of HTN.   OT comments  Pt admitted with the above and demonstrates the below listed deficits including impaired balance, decreased activity tolerance, Lt UE weakness, and impaired cognition.  He lives with his brother and sister in law and reports they can assist him as needed.  PTA, he reports he was able to perform ADLs mod I, ambulate with SPC and mod I, and family assisted with IADLs such as driving, and medication management.  Recommend HHOT and all further OT needs can be addressed by HHOT.  Acute OT will sign off at this time.   Follow Up Recommendations  Home health OT;Supervision - Intermittent    Equipment Recommendations  3 in 1 bedside commode;Tub/shower bench    Recommendations for Other Services      Precautions / Restrictions Precautions Precautions: Fall Precaution Comments: Residual left sided weakness from previous CVA - pt reports this is baseline for him       Mobility Bed Mobility Overal bed mobility: Modified Independent             General bed mobility comments: pt sitting up  in chair    Transfers Overall transfer level: Needs assistance Equipment used: Rolling walker (2 wheeled);None Transfers: Sit to/from [employer] to Stand: Min guard Stand pivot transfers: Min guard       General transfer comment: min guard for safety    Balance Overall balance assessment: Needs assistance Sitting-balance support: Feet supported;No upper extremity supported Sitting balance-Leahy Scale: Good     Standing balance support: Bilateral upper extremity supported;Single extremity supported Standing balance-Leahy Scale: Poor Standing balance comment: needs at least one hand supported on walker to stand with urinal, bil lower legs supported on chair and min guard assit from PT.                           ADL either performed or assessed with clinical judgement   ADL Overall ADL's : Needs assistance/impaired Eating/Feeding: Modified independent;Sitting   Grooming: Wash/dry hands;Wash/dry face;Brushing hair;Min guard;Standing               Lower Body Dressing: Min guard;Sit to/from stand Lower Body Dressing Details (indicate cue type and reason): Pt able to don/doff socks with supervision.  His toenail repeatedly snagged on the yarn, and pt repeatedly doffed the sock, then attempted to reapply it making the same error over and over again without changing his methodology Toilet Transfer: Min guard;Ambulation;Comfort height toilet;RW           Functional mobility during ADLs: Min guard;Rolling walker       Vision Patient Visual Report: No  change from baseline Additional Comments: pt able to read the clock on the wall with increased time to determine where the long and short hand were   Perception     Praxis      Cognition Arousal/Alertness: Awake/alert Behavior During Therapy: WFL for tasks assessed/performed Overall Cognitive Status: No family/caregiver present to determine baseline cognitive functioning                                  General Comments: Pt demonstrates deficits with memory, problem solving, and safety awareness.  Uncertain of his baseline        Exercises     Shoulder Instructions       General Comments Scrotal edema noted    Pertinent Vitals/ Pain       Pain Assessment: No/denies pain  Home Living Family/patient expects to be discharged to:: Private residence Living Arrangements: Other relatives Available Help at Discharge: Family;Available PRN/intermittently;Available 24 hours/day Type of Home: Mobile home Home Access: Stairs to enter Entrance Stairs-Number of Steps: 2 Entrance Stairs-Rails: Right;Left;Can reach both Home Layout: One level     Bathroom Shower/Tub: Other (comment)         Home Equipment: Walker - 2 wheels;Cane - single point   Additional Comments: Pt reports he lives with his brother and sister in law, who don't work, and are available as needed to assist him      Prior Functioning/Environment Level of Independence: Needs assistance  Gait / Transfers Assistance Needed: pt reports he uses O2 at home "4 pounds" and "sometimes" uses the cane or the walker ADL's / Homemaking Assistance Needed: Pt reports he sponge bathes, and get dressed mod I.  His brother cooks.  His sister assists him with medication management, and he manages his own finances.  He does not drive.  h/o falls Communication / Swallowing Assistance Needed: previous SLP notes indicate good swallowing during previous admissions. Comments: Pt is an inconsistent historian, no family present to confirm, some answers match chart review, others do not.   Frequency           Progress Toward Goals  OT Goals(current goals can now be found in the care plan section)     Acute Rehab OT Goals Patient Stated Goal: to go home today OT Goal Formulation: All assessment and education complete, DC therapy  Plan      Co-evaluation                 AM-PAC OT "6 Clicks"  Daily Activity     Outcome Measure   Help from another person eating meals?: None Help from another person taking care of personal grooming?: A Little Help from another person toileting, which includes using toliet, bedpan, or urinal?: A Little Help from another person bathing (including washing, rinsing, drying)?: A Little Help from another person to put on and taking off regular upper body clothing?: A Little Help from another person to put on and taking off regular lower body clothing?: A Little 6 Click Score: 19    End of Session Equipment Utilized During Treatment: Oxygen;Rolling walker  OT Visit Diagnosis: Muscle weakness (generalized) (M62.81);Cognitive communication deficit (R41.841);Unsteadiness on feet (R26.81)   Activity Tolerance Patient tolerated treatment well   Patient Left in chair;with call bell/phone within reach;with chair alarm set   Nurse Communication Mobility status        Time: 8185-6314 OT Time Calculation (min): 16 min  Charges:  OT General Charges $OT Visit: 1 Visit OT Evaluation $OT Eval Moderate Complexity: 1 Mod  Eber Jones., OTR/L Acute Rehabilitation Services Pager [phone] Office [phone]    Jeani Hawking M 09/12/2020, 2:31 PM

## 2020-09-12 NOTE — TOC Transition Note (Signed)
Transition of Care [facility]) - CM/SW Discharge Note   Patient Details  Name: Darin Hopkins MRN: [id number] Date of Birth: August 14, 1960  Transition of Care [name]) CM/SW Contact:  Durenda Guthrie, RN Phone Number: 09/12/2020, 2:25 PM   Clinical Narrative:   Case manager contacted Lorenza Chick , Liaison with [facility] with referral. Start of care will be next week- 09/15/20.     Final next level of care: Home w Home Health Services Barriers to Discharge: No Barriers Identified   Patient Goals and CMS Choice        Discharge Placement                       Discharge Plan and Services                          HH Arranged: PT,OT,Speech Therapy HH Agency: [facility] Date [facility] Agency Contacted: 09/12/20 Time HH Agency Contacted: 1425 Representative spoke with at [facility] Agency: Lorenza Chick  Social Determinants of Health (SDOH) Interventions     Readmission Risk Interventions No flowsheet data found.

## 2020-09-12 NOTE — Progress Notes (Signed)
SATURATION QUALIFICATIONS: (This note is used to comply with regulatory documentation for home oxygen)  Patient Saturations on Room Air at Rest = 94%  Patient Saturations on Room Air while Ambulating = 87%  Patient Saturations on 2 Liters of oxygen while Ambulating = 85%, I had to turn him up to 4 L while ambulating to maintain sats >90%.  Please briefly explain why patient needs home oxygen: Pt desaturates on RA while ambulating and needed 4L O2 to maintain sats in the 90s during gait.   Corinna Capra, PT, DPT  Acute Rehabilitation [phone] pager [phone] office

## 2020-09-12 NOTE — Progress Notes (Addendum)
PROGRESS NOTE  Darin Hopkins ZOX:[id number] DOB: 05/01/1961 DOA: 09/09/2020 PCP: Martie RoundSpencer, Nicole, NP   LOS: 3 days   Brief narrative: [age] year old male with past medical history significant for hypertension, hyperlipidemia, heart failure with preserved ejection fraction, CVA, seizures noncompliant on medication, alcohol abuse, COPD on home oxygen noncompliant with, CKD stage III presented to hospital at [facility] with episodes of seizures.  Patient was recently admitted to hospital from [id number] for presumed acute exacerbation of COPD.  Patient had 1 hour of seizure activity prior to coming to the hospital a was intubated for airway protection.  Patient was then [facility] for further care including LTM EEG and management of status epilepticus..  CT head scan was negative for acute findings.   Patient was subsequently considered stable for transfer out of the ICU.  Assessment/Plan:  Principal Problem:   Acute hypercapnic respiratory failure (HCC) Active Problems:   Acute exacerbation of CHF (congestive heart failure) (HCC)   Essential hypertension   History of seizure   COPD (chronic obstructive pulmonary disease) (HCC)   Hyperlipidemia   GERD (gastroesophageal reflux disease)   Alcohol abuse   Status epilepticus (HCC)   Breakthrough seizurewith concern for status epilepticus in the setting of EtOH use versus medication non-adherence,PriorR MCA territory stroke Patient was initially intubated for airway protection. Neurology was consulted. Patient has been changed to Keppra 750 mg twice daily at this time and will be continued on this dose on discharge. Continuous EEG was performed which was negative for active seizures. Neurology has recommended outpatient follow[facility] in about 12 weeks.   Acutehypoxic/hypercapnic respiratory failureon the background of status epilepticus and underlying COPD. Patient was initially intubated for  hypoxia/ hypercapnia and for airway protection. Status post extubation and currently on oxygen by nasal cannula at his baseline requirement.  Patient received Pulmicort inhalers albuterol inhalers during hospitalization.  He was advised compliance with his oxygen regimen at home.  Need oxygen supplementation on discharge.  Mild leukocytosis likely reactive. No signs of infection.   Essential hypertension,Chronic HFpEF on amlodipine, lisinopril, doxazosin, coreg.  Remained compensated.  Blood pressure was controlled.  Acute kidney injury onCKD stage IIIa Creatinine at baseline at this time.  History ofEtOH abuse Reportedly patient was trying to stop drinking. UDS was positive for benzodiazepines. Continue thiamine, folic acid.  Patient did not have any overt withdrawal symptoms.  Hypertriglyceridemia. Improved on repeat test.  Deconditioning, debility,patient states that he lives at home and has a walker for ambulation.   Physical therapy was consulted and recommended home health PT OT on discharge.  I spoke with the patient's sister who schertz to recover from heart problems and was in the hospital.  She is a caretaker for the patient and does not believe that she will be able to take care of him.  Patient does have limitations with ambulation and dyspnea.  She wishes the patient to be considered for rehabilitation at this time.   DVT prophylaxis: heparin injection 5,000 Units Start: 09/10/20 0600 SCDs Start: 09/09/20 1954   Code Status: Full code  Family Communication:  Spoke with the patient's sister on the phone and updated her about the clinical condition of the patient.  Status is: Inpatient  Remains inpatient appropriate because:IV treatments appropriate due to intensity of illness or inability to take PO and Inpatient level of care appropriate due to severity of illness, unsafe disposition.   Dispo: The patient is from: Home  Anticipated d/c is to:  Undetermined at this time .[facility]  Will consult TOC.  Likely home with home health versus a skilled nursing facility.              Patient currently is not medically stable to d/c.   Difficult to place patient No   Consultants:  PCCM  Neurology  Procedures:  EEG,   intubation and mechanical ventilation  Anti-infectives:  . None  Anti-infectives (From admission, onward)   None     Subjective: Today, patient was seen and examined at bedside.  Feels at his baseline.  No further seizure-like episodes while in hospital.  No signs of tremors or shaking withdrawals.  Has generalized weakness and difficulty ambulation.  Objective: Vitals:   09/12/20 0845 09/12/20 1600  BP:  125/88  Pulse: 92 63  Resp: 18 14  Temp:  98 F (36.7 C)  SpO2:  94%    Intake/Output Summary (Last 24 hours) at 09/12/2020 1616 Last data filed at 09/12/2020 0100 Gross per 24 hour  Intake 240 ml  Output 300 ml  Net -60 ml   Filed Weights   09/10/20 0500  Weight: 59.3 kg   Body mass index is 19.88 kg/m.   Physical Exam: General: Alert awake, not in obvious distress, on nasal cannula oxygen, HENT: pupils equally reacting to light,  No scleral pallor or icterus noted. Oral mucosa is moist.  Chest:    Diminished breath sounds bilaterally. No crackles or wheezes.  CVS: S1 &S2 heard. No murmur.  Regular rate and rhythm. Abdomen: Soft, nontender, nondistended.  Bowel sounds are heard.   Extremities: No cyanosis, clubbing or edema.  Peripheral pulses are palpable. Psych: Alert, awake and oriented, normal mood CNS:  No cranial nerve deficits.  Power equal in all extremities.   Skin: Warm and dry.  No rashes noted.  Data Review: I have personally reviewed the following laboratory data and studies,  CBC: Recent Labs  Lab 09/09/20 1152 09/10/20 0337 09/10/20 0452 09/12/20 0150  WBC 12.2*  --  14.6* 12.2*  NEUTROABS 5.1  --   --   --   HGB 11.4* 12.2* 12.8* 11.3*  HCT 37.4* 36.0* 39.2 37.3*  MCV  102.2*  --  100.3* 102.2*  PLT 183  --  114* 159   Basic Metabolic Panel: Recent Labs  Lab 09/09/20 1152 09/10/20 0337 09/10/20 0452 09/12/20 0150  NA 145 146* 138 145  K 4.2 3.4* 5.1 4.3  CL 107  --  108 109  CO2 30  --  21* 32  GLUCOSE 84  --  75 90  BUN 36*  --  30* 23*  CREATININE 1.38*  --  1.13 1.10  CALCIUM 8.7*  --  8.2* 8.9  MG 2.0  --  2.1 1.8   Liver Function Tests: Recent Labs  Lab 09/09/20 1152  AST 25  ALT 24  ALKPHOS 36*  BILITOT 0.6  PROT 6.9  ALBUMIN 3.8   No results for input(s): LIPASE, AMYLASE in the last 168 hours. No results for input(s): AMMONIA in the last 168 hours. Cardiac Enzymes: No results for input(s): CKTOTAL, CKMB, CKMBINDEX, TROPONINI in the last 168 hours. BNP (last 3 results) Recent Labs    08/17/20 1037 08/27/20 1304  BNP 1,306.1* 285.0*    ProBNP (last 3 results) No results for input(s): PROBNP in the last 8760 hours.  CBG: Recent Labs  Lab 09/09/20 1202  GLUCAP 79   Recent Results (from the past 240 hour(s))  Resp Panel by RT-PCR (Flu A&B, Covid) Nasopharyngeal Swab     Status: None   Collection Time: 09/09/20  1:46 PM   Specimen: Nasopharyngeal Swab; Nasopharyngeal(NP) swabs in vial transport medium  Result Value Ref Range Status   SARS Coronavirus 2 by RT PCR NEGATIVE NEGATIVE Final    Comment: (NOTE) SARS-CoV-2 target nucleic acids are NOT DETECTED.  The SARS-CoV-2 RNA is generally detectable in upper respiratory specimens during the acute phase of infection. The lowest concentration of SARS-CoV-2 viral copies this assay can detect is 138 copies/mL. A negative result does not preclude SARS-Cov-2 infection and should not be used as the sole basis for treatment or other patient management decisions. A negative result may occur with  improper specimen collection/handling, submission of specimen other than nasopharyngeal swab, presence of viral mutation(s) within the areas targeted by this assay, and inadequate  number of viral copies(<138 copies/mL). A negative result must be combined with clinical observations, patient history, and epidemiological information. The expected result is Negative.  Fact Sheet for Patients:  BloggerCourse.com  Fact Sheet for Healthcare Providers:  SeriousBroker.it  This test is no t yet approved or cleared by the [place] FDA and  has been authorized for detection and/or diagnosis of SARS-CoV-2 by FDA under an Emergency Use Authorization (EUA). This EUA will remain  in effect (meaning this test can be used) for the duration of the COVID-19 declaration under Section 564(b)(1) of the Act, 21 U.S.C.section 360bbb-3(b)(1), unless the authorization is terminated  or revoked sooner.       Influenza A by PCR NEGATIVE NEGATIVE Final   Influenza B by PCR NEGATIVE NEGATIVE Final    Comment: (NOTE) The Xpert Xpress SARS-CoV-2/FLU/RSV plus assay is intended as an aid in the diagnosis of influenza from Nasopharyngeal swab specimens and should not be used as a sole basis for treatment. Nasal washings and aspirates are unacceptable for Xpert Xpress SARS-CoV-2/FLU/RSV testing.  Fact Sheet for Patients: BloggerCourse.com  Fact Sheet for Healthcare Providers: SeriousBroker.it  This test is not yet approved or cleared by the [place] FDA and has been authorized for detection and/or diagnosis of SARS-CoV-2 by FDA under an Emergency Use Authorization (EUA). This EUA will remain in effect (meaning this test can be used) for the duration of the COVID-19 declaration under Section 564(b)(1) of the Act, 21 U.S.C. section 360bbb-3(b)(1), unless the authorization is terminated or revoked.  Performed at [facility], [address] [place], [place] [address]   Blood culture (routine x 2)     Status: None (Preliminary result)   Collection Time: 09/09/20  1:46  PM   Specimen: BLOOD  Result Value Ref Range Status   Specimen Description BLOOD BLOOD LEFT FOREARM  Final   Special Requests   Final    BOTTLES DRAWN AEROBIC AND ANAEROBIC Blood Culture adequate volume   Culture   Final    NO GROWTH 3 DAYS Performed at [facility], [address] [place], [place] [address]    Report Status PENDING  Incomplete  Blood culture (routine x 2)     Status: Abnormal   Collection Time: 09/09/20  1:46 PM   Specimen: BLOOD  Result Value Ref Range Status   Specimen Description   Final    BLOOD LEFT ANTECUBITAL Performed at [facility], [address], [place], [place] [address]    Special Requests   Final    BOTTLES DRAWN AEROBIC AND ANAEROBIC Blood Culture adequate volume Performed at [facility], [address] [place], [place] [address]    Culture  Setup Time   Final    Organism ID to follow GRAM POSITIVE COCCI AEROBIC BOTTLE ONLY CRITICAL RESULT CALLED TO, READ BACK BY AND VERIFIED WITH: Viviano Simas RN @1101  09/10/20 SCS Performed at [facility], [address] [place], [place]    Culture (A)  Final    STAPHYLOCOCCUS EPIDERMIDIS THE SIGNIFICANCE OF ISOLATING THIS ORGANISM FROM A SINGLE SET OF BLOOD CULTURES WHEN MULTIPLE SETS ARE DRAWN IS UNCERTAIN. PLEASE NOTIFY THE MICROBIOLOGY DEPARTMENT WITHIN ONE WEEK IF SPECIATION AND SENSITIVITIES ARE REQUIRED. Performed at [facility], [address], [place], [place]    Report Status 09/12/2020 FINAL  Final  Blood Culture ID Panel (Reflexed)     Status: Abnormal   Collection Time: 09/09/20  1:46 PM  Result Value Ref Range Status   Enterococcus faecalis NOT DETECTED NOT DETECTED Final   Enterococcus Faecium NOT DETECTED NOT DETECTED Final   Listeria monocytogenes NOT DETECTED NOT DETECTED Final   Staphylococcus species DETECTED (A) NOT DETECTED Final    Comment: CRITICAL RESULT CALLED TO, READ BACK BY AND VERIFIED WITH: 11/09/20 RN  @1101  09/10/20 SCS    Staphylococcus aureus (BCID) NOT DETECTED NOT DETECTED Final   Staphylococcus epidermidis DETECTED (A) NOT DETECTED Final    Comment: CRITICAL RESULT CALLED TO, READ BACK BY AND VERIFIED WITH: RN @1101  09/10/20 SCS    Staphylococcus lugdunensis NOT DETECTED NOT DETECTED Final   Streptococcus species NOT DETECTED NOT DETECTED Final   Streptococcus agalactiae NOT DETECTED NOT DETECTED Final   Streptococcus pneumoniae NOT DETECTED NOT DETECTED Final   Streptococcus pyogenes NOT DETECTED NOT DETECTED Final   A.calcoaceticus-baumannii NOT DETECTED NOT DETECTED Final   Bacteroides fragilis NOT DETECTED NOT DETECTED Final   Enterobacterales NOT DETECTED NOT DETECTED Final   Enterobacter cloacae complex NOT DETECTED NOT DETECTED Final   Escherichia coli NOT DETECTED NOT DETECTED Final   Klebsiella aerogenes NOT DETECTED NOT DETECTED Final   Klebsiella oxytoca NOT DETECTED NOT DETECTED Final   Klebsiella pneumoniae NOT DETECTED NOT DETECTED Final   Proteus species NOT DETECTED NOT DETECTED Final   Salmonella species NOT DETECTED NOT DETECTED Final   Serratia marcescens NOT DETECTED NOT DETECTED Final   Haemophilus influenzae NOT DETECTED NOT DETECTED Final   Neisseria meningitidis NOT DETECTED NOT DETECTED Final   Pseudomonas aeruginosa NOT DETECTED NOT DETECTED Final   Stenotrophomonas maltophilia NOT DETECTED NOT DETECTED Final   Candida albicans NOT DETECTED NOT DETECTED Final   Candida auris NOT DETECTED NOT DETECTED Final   Candida glabrata NOT DETECTED NOT DETECTED Final   Candida krusei NOT DETECTED NOT DETECTED Final   Candida parapsilosis NOT DETECTED NOT DETECTED Final   Candida tropicalis NOT DETECTED NOT DETECTED Final   Cryptococcus neoformans/gattii NOT DETECTED NOT DETECTED Final   Methicillin resistance mecA/C NOT DETECTED NOT DETECTED Final    Comment: Performed at [facility], [address] [place], [facility] [address]      Studies: No results found.  [place], MD  [facility] 09/12/2020  If 7PM-7AM, please contact night-coverage

## 2020-09-13 NOTE — Plan of Care (Signed)
°  Problem: Coping: °Goal: Ability to adjust to condition or change in health will improve °Outcome: Progressing °  °Problem: Health Behavior/Discharge Planning: °Goal: Compliance with prescribed medication regimen will improve °Outcome: Progressing °  °Problem: Clinical Measurements: °Goal: Complications related to the disease process, condition or treatment will be avoided or minimized °Outcome: Progressing °  °Problem: Safety: °Goal: Verbalization of understanding the information provided will improve °Outcome: Progressing °  °

## 2020-09-13 NOTE — Progress Notes (Signed)
PROGRESS NOTE  DEMAREE LIBERTO JJK:[id number] DOB: Jul 15, 1960 DOA: 09/09/2020 PCP: Martie Round, NP   LOS: 4 days   Brief narrative:  60 year old male with past medical history significant for hypertension, hyperlipidemia, heart failure with preserved ejection fraction, CVA, seizures noncompliant on medication, alcohol abuse, COPD on home oxygen noncompliant with, CKD stage III presented to hospital at [facility] with episodes of seizures.  Patient was recently admitted to hospital from [id number] for presumed acute exacerbation of COPD.  Patient had 1 hour of seizure activity prior to coming to the hospital a was intubated for airway protection.  Patient was then transferred to [facility] for further care including LTM EEG and management of status epilepticus..  CT head scan was negative for acute findings.   Patient was subsequently considered stable for transfer out of the ICU.  Assessment/Plan:  Principal Problem:   Acute hypercapnic respiratory failure (HCC) Active Problems:   Acute exacerbation of CHF (congestive heart failure) (HCC)   Essential hypertension   History of seizure   COPD (chronic obstructive pulmonary disease) (HCC)   Hyperlipidemia   GERD (gastroesophageal reflux disease)   Alcohol abuse   Status epilepticus (HCC)   Breakthrough seizurewith concern for status epilepticus in the setting of EtOH use versus medication non-adherence,PriorR MCA territory stroke Patient was initially intubated for airway protection. Neurology was consulted. Patient has been changed to Keppra 750 mg twice daily at this time and will be continued on this dose on discharge. Continuous EEG was performed which was negative for active seizures. Neurology has recommended outpatient follow-up with neurology at [facility] in about 12 weeks.   Acutehypoxic/hypercapnic respiratory failureon the background of status epilepticus and underlying COPD. Patient was initially intubated  for hypoxia/ hypercapnia and for airway protection. Status post extubation and currently on oxygen by nasal cannula at his baseline requirement.  3 L of oxygen by nasal cannula. Patient received Pulmicort inhalers albuterol inhalers during hospitalization.  He was advised compliance with his oxygen regimen at home.  Need oxygen supplementation on discharge.  Mild leukocytosis likely reactive. No signs of infection.   Essential hypertension,Chronic HFpEF on amlodipine, lisinopril, doxazosin, coreg.  Remained compensated.  Blood pressures controlled  Acute kidney injury onCKD stage IIIa Creatinine at baseline at this time.  Follow BMP in a.m.  History ofEtOH abuse Reportedly patient was trying to stop drinking. UDS was positive for benzodiazepines. Continue thiamine, folic acid.  Patient did not have any overt withdrawal symptoms.  Hypertriglyceridemia. Improved on repeat test.  Deconditioning, debility,patient states that he lives at home and has a walker for ambulation.   Physical therapy was consulted and recommended home health PT, OT on discharge.  I spoke with the patient's sister who is recovering from recent hospitalization from heart problems.  She is a caretaker for the patient and does not believe that she will be able to take care of him.  Patient does have limitations with ambulation and dyspnea.  She wishes the patient to be considered for rehabilitation at this time.  Have consulted for transition of care.  DVT prophylaxis: heparin injection 5,000 Units Start: 09/10/20 0600 SCDs Start: 09/09/20 1954   Code Status: Full code  Family Communication:  None today.  Spoke with the patient's sister yesterday   Status is: Inpatient  Remains inpatient appropriate because:IV treatments appropriate due to intensity of illness or inability to take PO and Inpatient level of care appropriate due to severity of illness, unsafe disposition.   Dispo: The patient is  from:  Home              Anticipated d/c is to: Undetermined at this time .[facility]  Will consult TOC.  Likely home with home health versus a skilled nursing facility.              Patient currently is not medically stable to d/c.   Difficult to place patient No   Consultants:  PCCM  Neurology  Procedures:  EEG,   intubation and mechanical ventilation  Anti-infectives:  . None  Anti-infectives (From admission, onward)   None     Subjective: Today, patient was seen and examined at bedside.  Continues to feel short winded but likely at baseline.  Fatigued and weak.  No further seizures reported.   Objective: Vitals:   09/13/20 1302 09/13/20 1310  BP: 92/62   Pulse: 73   Resp: 18   Temp: 98 F (36.7 C)   SpO2: 92% 91%    Intake/Output Summary (Last 24 hours) at 09/13/2020 1445 Last data filed at 09/13/2020 1314 Gross per 24 hour  Intake 510 ml  Output 730 ml  Net -220 ml   Filed Weights   09/10/20 0500  Weight: 59.3 kg   Body mass index is 19.88 kg/m.   Physical Exam: General: Alert awake, not in obvious distress, on nasal cannula oxygen, HENT: pupils equally reacting to light,  No scleral pallor or icterus noted. Oral mucosa is moist.  Chest:    Diminished breath sounds bilaterally. No crackles or wheezes.  CVS: S1 &S2 heard. No murmur.  Regular rate and rhythm. Abdomen: Soft, nontender, nondistended.  Bowel sounds are heard.   Extremities: No cyanosis, clubbing or edema.  Peripheral pulses are palpable. Psych: Alert, awake and oriented, normal mood CNS:  No cranial nerve deficits.  Power equal in all extremities.   Skin: Warm and dry.  No rashes noted.  Data Review: I have personally reviewed the following laboratory data and studies,  CBC: Recent Labs  Lab 09/09/20 1152 09/10/20 0337 09/10/20 0452 09/12/20 0150  WBC 12.2*  --  14.6* 12.2*  NEUTROABS 5.1  --   --   --   HGB 11.4* 12.2* 12.8* 11.3*  HCT 37.4* 36.0* 39.2 37.3*  MCV 102.2*  --  100.3* 102.2*   PLT 183  --  114* 159   Basic Metabolic Panel: Recent Labs  Lab 09/09/20 1152 09/10/20 0337 09/10/20 0452 09/12/20 0150  NA 145 146* 138 145  K 4.2 3.4* 5.1 4.3  CL 107  --  108 109  CO2 30  --  21* 32  GLUCOSE 84  --  75 90  BUN 36*  --  30* 23*  CREATININE 1.38*  --  1.13 1.10  CALCIUM 8.7*  --  8.2* 8.9  MG 2.0  --  2.1 1.8   Liver Function Tests: Recent Labs  Lab 09/09/20 1152  AST 25  ALT 24  ALKPHOS 36*  BILITOT 0.6  PROT 6.9  ALBUMIN 3.8   No results for input(s): LIPASE, AMYLASE in the last 168 hours. No results for input(s): AMMONIA in the last 168 hours. Cardiac Enzymes: No results for input(s): CKTOTAL, CKMB, CKMBINDEX, TROPONINI in the last 168 hours. BNP (last 3 results) Recent Labs    08/17/20 1037 08/27/20 1304  BNP 1,306.1* 285.0*    ProBNP (last 3 results) No results for input(s): PROBNP in the last 8760 hours.  CBG: Recent Labs  Lab 09/09/20 1202  GLUCAP 79   Recent  Results (from the past 240 hour(s))  Resp Panel by RT-PCR (Flu A&B, Covid) Nasopharyngeal Swab     Status: None   Collection Time: 09/09/20  1:46 PM   Specimen: Nasopharyngeal Swab; Nasopharyngeal(NP) swabs in vial transport medium  Result Value Ref Range Status   SARS Coronavirus 2 by RT PCR NEGATIVE NEGATIVE Final    Comment: (NOTE) SARS-CoV-2 target nucleic acids are NOT DETECTED.  The SARS-CoV-2 RNA is generally detectable in upper respiratory specimens during the acute phase of infection. The lowest concentration of SARS-CoV-2 viral copies this assay can detect is 138 copies/mL. A negative result does not preclude SARS-Cov-2 infection and should not be used as the sole basis for treatment or other patient management decisions. A negative result may occur with  improper specimen collection/handling, submission of specimen other than nasopharyngeal swab, presence of viral mutation(s) within the areas targeted by this assay, and inadequate number of  viral copies(<138 copies/mL). A negative result must be combined with clinical observations, patient history, and epidemiological information. The expected result is Negative.  Fact Sheet for Patients:  BloggerCourse.com  Fact Sheet for Healthcare Providers:  SeriousBroker.it  This test is no t yet approved or cleared by the [place] FDA and  has been authorized for detection and/or diagnosis of SARS-CoV-2 by FDA under an Emergency Use Authorization (EUA). This EUA will remain  in effect (meaning this test can be used) for the duration of the COVID-19 declaration under Section 564(b)(1) of the Act, 21 U.S.C.section 360bbb-3(b)(1), unless the authorization is terminated  or revoked sooner.       Influenza A by PCR NEGATIVE NEGATIVE Final   Influenza B by PCR NEGATIVE NEGATIVE Final    Comment: (NOTE) The Xpert Xpress SARS-CoV-2/FLU/RSV plus assay is intended as an aid in the diagnosis of influenza from Nasopharyngeal swab specimens and should not be used as a sole basis for treatment. Nasal washings and aspirates are unacceptable for Xpert Xpress SARS-CoV-2/FLU/RSV testing.  Fact Sheet for Patients: BloggerCourse.com  Fact Sheet for Healthcare Providers: SeriousBroker.it  This test is not yet approved or cleared by the [place] FDA and has been authorized for detection and/or diagnosis of SARS-CoV-2 by FDA under an Emergency Use Authorization (EUA). This EUA will remain in effect (meaning this test can be used) for the duration of the COVID-19 declaration under Section 564(b)(1) of the Act, 21 U.S.C. section 360bbb-3(b)(1), unless the authorization is terminated or revoked.  Performed at [facility], [address] [place], [place] [address]   Blood culture (routine x 2)     Status: None (Preliminary result)   Collection Time: 09/09/20  1:46 PM    Specimen: BLOOD  Result Value Ref Range Status   Specimen Description BLOOD BLOOD LEFT FOREARM  Final   Special Requests   Final    BOTTLES DRAWN AEROBIC AND ANAEROBIC Blood Culture adequate volume   Culture   Final    NO GROWTH 4 DAYS Performed at [facility], [address] [place], [place] [address]    Report Status PENDING  Incomplete  Blood culture (routine x 2)     Status: Abnormal   Collection Time: 09/09/20  1:46 PM   Specimen: BLOOD  Result Value Ref Range Status   Specimen Description   Final    BLOOD LEFT ANTECUBITAL Performed at [facility], [address], [place], [place] [address]    Special Requests   Final    BOTTLES DRAWN AEROBIC AND ANAEROBIC Blood Culture adequate  volume Performed at [facility], [address] [place], [place] [address]    Culture  Setup Time   Final    Organism ID to follow GRAM POSITIVE COCCI AEROBIC BOTTLE ONLY CRITICAL RESULT CALLED TO, READ BACK BY AND VERIFIED WITH: Viviano SimasLIZ GANNON RN @1101  09/10/20 SCS Performed at [facility], [address] [place], [place] [address]    Culture (A)  Final    STAPHYLOCOCCUS EPIDERMIDIS THE SIGNIFICANCE OF ISOLATING THIS ORGANISM FROM A SINGLE SET OF BLOOD CULTURES WHEN MULTIPLE SETS ARE DRAWN IS UNCERTAIN. PLEASE NOTIFY THE MICROBIOLOGY DEPARTMENT WITHIN ONE WEEK IF SPECIATION AND SENSITIVITIES ARE REQUIRED. Performed at [facility], [address] [place], [place] [address]    Report Status 09/12/2020 FINAL  Final  Blood Culture ID Panel (Reflexed)     Status: Abnormal   Collection Time: 09/09/20  1:46 PM  Result Value Ref Range Status   Enterococcus faecalis NOT DETECTED NOT DETECTED Final   Enterococcus Faecium NOT DETECTED NOT DETECTED Final   Listeria monocytogenes NOT DETECTED NOT DETECTED Final   Staphylococcus species DETECTED (A) NOT DETECTED Final    Comment: CRITICAL RESULT CALLED TO, READ BACK BY AND VERIFIED WITH: Viviano SimasLIZ GANNON RN @1101   09/10/20 SCS    Staphylococcus aureus (BCID) NOT DETECTED NOT DETECTED Final   Staphylococcus epidermidis DETECTED (A) NOT DETECTED Final    Comment: CRITICAL RESULT CALLED TO, READ BACK BY AND VERIFIED WITH: Viviano SimasLIZ GANNON RN @1101  09/10/20 SCS    Staphylococcus lugdunensis NOT DETECTED NOT DETECTED Final   Streptococcus species NOT DETECTED NOT DETECTED Final   Streptococcus agalactiae NOT DETECTED NOT DETECTED Final   Streptococcus pneumoniae NOT DETECTED NOT DETECTED Final   Streptococcus pyogenes NOT DETECTED NOT DETECTED Final   A.calcoaceticus-baumannii NOT DETECTED NOT DETECTED Final   Bacteroides fragilis NOT DETECTED NOT DETECTED Final   Enterobacterales NOT DETECTED NOT DETECTED Final   Enterobacter cloacae complex NOT DETECTED NOT DETECTED Final   Escherichia coli NOT DETECTED NOT DETECTED Final   Klebsiella aerogenes NOT DETECTED NOT DETECTED Final   Klebsiella oxytoca NOT DETECTED NOT DETECTED Final   Klebsiella pneumoniae NOT DETECTED NOT DETECTED Final   Proteus species NOT DETECTED NOT DETECTED Final   Salmonella species NOT DETECTED NOT DETECTED Final   Serratia marcescens NOT DETECTED NOT DETECTED Final   Haemophilus influenzae NOT DETECTED NOT DETECTED Final   Neisseria meningitidis NOT DETECTED NOT DETECTED Final   Pseudomonas aeruginosa NOT DETECTED NOT DETECTED Final   Stenotrophomonas maltophilia NOT DETECTED NOT DETECTED Final   Candida albicans NOT DETECTED NOT DETECTED Final   Candida auris NOT DETECTED NOT DETECTED Final   Candida glabrata NOT DETECTED NOT DETECTED Final   Candida krusei NOT DETECTED NOT DETECTED Final   Candida parapsilosis NOT DETECTED NOT DETECTED Final   Candida tropicalis NOT DETECTED NOT DETECTED Final   Cryptococcus neoformans/gattii NOT DETECTED NOT DETECTED Final   Methicillin resistance mecA/C NOT DETECTED NOT DETECTED Final    Comment: Performed at [facility], [address] [place], [place] [address]     Studies: No  results found.  Joycelyn DasLaxman Colton Engdahl, MD  [facility] 09/13/2020  If 7PM-7AM, please contact night-coverage

## 2020-09-14 ENCOUNTER — Encounter (HOSPITAL_COMMUNITY): Payer: Self-pay | Admitting: Pulmonary Disease

## 2020-09-14 LAB — BASIC METABOLIC PANEL
Anion gap: 5 (ref 5–15)
BUN: 24 mg/dL — ABNORMAL HIGH (ref 6–20)
CO2: 31 mmol/L (ref 22–32)
Calcium: 8.8 mg/dL — ABNORMAL LOW (ref 8.9–10.3)
Chloride: 107 mmol/L (ref 98–111)
Creatinine, Ser: 1.35 mg/dL — ABNORMAL HIGH (ref 0.61–1.24)
GFR, Estimated: >60 mL/min (ref >60)
Glucose, Bld: 75 mg/dL (ref 70–99)
Potassium: 4.1 mmol/L (ref 3.5–5.1)
Sodium: 143 mmol/L (ref 135–145)

## 2020-09-14 LAB — CBC
HCT: 36.2 % — ABNORMAL LOW (ref 39.0–52.0)
Hemoglobin: 10.8 g/dL — ABNORMAL LOW (ref 13.0–17.0)
MCH: 30.8 pg (ref 26.0–34.0)
MCHC: 29.8 g/dL — ABNORMAL LOW (ref 30.0–36.0)
MCV: 103.1 fL — ABNORMAL HIGH (ref 80.0–100.0)
Platelets: 145 10*3/uL — ABNORMAL LOW (ref 150–400)
RBC: 3.51 MIL/uL — ABNORMAL LOW (ref 4.22–5.81)
RDW: 12.8 % (ref 11.5–15.5)
WBC: 8.2 10*3/uL (ref 4.0–10.5)
nRBC: 0 % (ref 0.0–0.2)

## 2020-09-14 LAB — CULTURE, BLOOD (ROUTINE X 2)
Culture: NO GROWTH
Special Requests: ADEQUATE

## 2020-09-14 NOTE — Plan of Care (Signed)
  Problem: Coping: Goal: Ability to adjust to condition or change in health will improve Outcome: Progressing   Problem: Health Behavior/Discharge Planning: Goal: Compliance with prescribed medication regimen will improve Outcome: Progressing   Problem: Clinical Measurements: Goal: Complications related to the disease process, condition or treatment will be avoided or minimized Outcome: Progressing   Problem: Safety: Goal: Verbalization of understanding the information provided will improve Outcome: Progressing   Problem: Self-Concept: Goal: Level of anxiety will decrease Outcome: Progressing

## 2020-09-14 NOTE — Progress Notes (Signed)
PROGRESS NOTE  Darin Hopkins EXB:[id number] DOB: 1960-10-06 DOA: 09/09/2020 PCP: Martie Round, NP   LOS: 5 days   Brief narrative:  60 year old male with past medical history significant for hypertension, hyperlipidemia, heart failure with preserved ejection fraction, CVA, seizures noncompliant on medication, alcohol abuse, COPD on home oxygen noncompliant with, CKD stage III presented to hospital at [facility] with episodes of seizures.  Patient was recently admitted to hospital from [id number] for presumed acute exacerbation of COPD.  Patient had 1 hour of seizure activity prior to coming to the hospital a was intubated for airway protection.  Patient was then transferred to [facility] for further care including LTM EEG and management of status epilepticus..  CT head scan was negative for acute findings.   Patient was subsequently considered stable for transfer out of the ICU.  Assessment/Plan:  Principal Problem:   Acute hypercapnic respiratory failure (HCC) Active Problems:   Acute exacerbation of CHF (congestive heart failure) (HCC)   Essential hypertension   History of seizure   COPD (chronic obstructive pulmonary disease) (HCC)   Hyperlipidemia   GERD (gastroesophageal reflux disease)   Alcohol abuse   Status epilepticus (HCC)   Breakthrough seizurewith concern for status epilepticus in the setting of EtOH use versus medication non-adherence, PriorR MCA territory stroke  Patient was initially intubated for airway protection.  Neurology was consulted.  On Keppra 750mg  twice daily at this time and will be continued on this dose on discharge.  Continuous EEG was performed which was negative for active seizures.  Neurology has recommended outpatient follow-up with neurology at [facility] in about 12 weeks.    Acutehypoxic/hypercapnic respiratory failure on the background of status epilepticus and underlying COPD.  Patient was initially intubated for hypoxia/ hypercapnia  and for airway protection.  Status post extubation and currently on oxygen by nasal cannula at his baseline requirement. Continue Pulmicort inhalers albuterol inhaler. He was advised compliance with his oxygen regimen at home.  Mild leukocytosis resolved. Reactive.  Essential hypertension, Chronic HFpEF Continue amlodipine, lisinopril, doxazosin, coreg on discharge.  Remained compensated.  Blood pressure is controlled.  Acute kidney injury onCKD stage IIIa Creatinine at baseline at this time. Lab Results  Component Value Date   CREATININE 1.35 (H) 09/14/2020   CREATININE 1.10 09/12/2020   CREATININE 1.13 09/10/2020    History of EtOH abuse Reportedly patient was trying to stop drinking.  UDS was positive for benzodiazepines.  Continue thiamine, folic acid on discharge.  Patient did not have any overt withdrawal symptoms.  Hypertriglyceridemia.    Improved on repeat  Deconditioning, debility, patient states that he lives at home and has a walker for ambulation.    Physical therapy was consulted and recommended home health PT OT on discharge but family concerned about discharge.  DVT prophylaxis: heparin injection 5,000 Units Start: 09/10/20 0600 SCDs Start: 09/09/20 1954   Code Status: Full code  Family Communication:  Spoke with the patient's sister on 5/7  Status is: Inpatient  Remains inpatient appropriate because:IV treatments appropriate due to intensity of illness or inability to take PO and Inpatient level of care appropriate due to severity of illness, unsafe disposition.   Dispo: The patient is from: Home              Anticipated d/c is to: Undetermined at this time .   TOC consulted for disposition.  Likely home with home health versus a skilled nursing facility.  Patient currently is not medically stable to d/c.   Difficult to place patient No   Consultants:  PCCM  Neurology  Procedures:  EEG,   intubation and mechanical  ventilation  Anti-infectives:  . None  Anti-infectives (From admission, onward)   None     Subjective: Today, patient was seen and examined at bedside.  Patient feels  stable.  Denies any nausea, vomiting, fever chills or rigor.   Objective: Vitals:   09/14/20 0432 09/14/20 0813  BP: 132/87   Pulse: (!) 58   Resp: 15   Temp: 98.1 F (36.7 C)   SpO2: 99% 99%    Intake/Output Summary (Last 24 hours) at 09/14/2020 0841 Last data filed at 09/14/2020 0630 Gross per 24 hour  Intake 480 ml  Output 720 ml  Net -240 ml   Filed Weights   09/10/20 0500  Weight: 59.3 kg   Body mass index is 19.88 kg/m.   Physical Exam:   General:  Average built, not in obvious distress, on nasal cannula oxygen HENT:   No scleral pallor or icterus noted. Oral mucosa is moist.  Chest:    Diminished breath sounds bilaterally.  No overt wheezing noted CVS: S1 &S2 heard. No murmur.  Regular rate and rhythm. Abdomen: Soft, nontender, nondistended.  Bowel sounds are heard.   Extremities: No cyanosis, clubbing or edema.  Peripheral pulses are palpable. Psych: Alert, awake and oriented, normal mood CNS:  No cranial nerve deficits.  Power equal in all extremities.   Skin: Warm and dry.  No rashes noted.   Data Review: I have personally reviewed the following laboratory data and studies,  CBC: Recent Labs  Lab 09/09/20 1152 09/10/20 0337 09/10/20 0452 09/12/20 0150 09/14/20 0211  WBC 12.2*  --  14.6* 12.2* 8.2  NEUTROABS 5.1  --   --   --   --   HGB 11.4* 12.2* 12.8* 11.3* 10.8*  HCT 37.4* 36.0* 39.2 37.3* 36.2*  MCV 102.2*  --  100.3* 102.2* 103.1*  PLT 183  --  114* 159 145*   Basic Metabolic Panel: Recent Labs  Lab 09/09/20 1152 09/10/20 0337 09/10/20 0452 09/12/20 0150 09/14/20 0211  NA 145 146* 138 145 143  K 4.2 3.4* 5.1 4.3 4.1  CL 107  --  108 109 107  CO2 30  --  21* 32 31  GLUCOSE 84  --  75 90 75  BUN 36*  --  30* 23* 24*  CREATININE 1.38*  --  1.13 1.10 1.35*   CALCIUM 8.7*  --  8.2* 8.9 8.8*  MG 2.0  --  2.1 1.8  --    Liver Function Tests: Recent Labs  Lab 09/09/20 1152  AST 25  ALT 24  ALKPHOS 36*  BILITOT 0.6  PROT 6.9  ALBUMIN 3.8   No results for input(s): LIPASE, AMYLASE in the last 168 hours. No results for input(s): AMMONIA in the last 168 hours. Cardiac Enzymes: No results for input(s): CKTOTAL, CKMB, CKMBINDEX, TROPONINI in the last 168 hours. BNP (last 3 results) Recent Labs    08/17/20 1037 08/27/20 1304  BNP 1,306.1* 285.0*    ProBNP (last 3 results) No results for input(s): PROBNP in the last 8760 hours.  CBG: Recent Labs  Lab 09/09/20 1202  GLUCAP 79   Recent Results (from the past 240 hour(s))  Resp Panel by RT-PCR (Flu A&B, Covid) Nasopharyngeal Swab     Status: None   Collection Time: 09/09/20  1:46 PM   Specimen:  Nasopharyngeal Swab; Nasopharyngeal(NP) swabs in vial transport medium  Result Value Ref Range Status   SARS Coronavirus 2 by RT PCR NEGATIVE NEGATIVE Final    Comment: (NOTE) SARS-CoV-2 target nucleic acids are NOT DETECTED.  The SARS-CoV-2 RNA is generally detectable in upper respiratory specimens during the acute phase of infection. The lowest concentration of SARS-CoV-2 viral copies this assay can detect is 138 copies/mL. A negative result does not preclude SARS-Cov-2 infection and should not be used as the sole basis for treatment or other patient management decisions. A negative result may occur with  improper specimen collection/handling, submission of specimen other than nasopharyngeal swab, presence of viral mutation(s) within the areas targeted by this assay, and inadequate number of viral copies(<138 copies/mL). A negative result must be combined with clinical observations, patient history, and epidemiological information. The expected result is Negative.  Fact Sheet for Patients:  BloggerCourse.com  Fact Sheet for Healthcare Providers:   SeriousBroker.it  This test is no t yet approved or cleared by the [place] FDA and  has been authorized for detection and/or diagnosis of SARS-CoV-2 by FDA under an Emergency Use Authorization (EUA). This EUA will remain  in effect (meaning this test can be used) for the duration of the COVID-19 declaration under Section 564(b)(1) of the Act, 21 U.S.C.section 360bbb-3(b)(1), unless the authorization is terminated  or revoked sooner.       Influenza A by PCR NEGATIVE NEGATIVE Final   Influenza B by PCR NEGATIVE NEGATIVE Final    Comment: (NOTE) The Xpert Xpress SARS-CoV-2/FLU/RSV plus assay is intended as an aid in the diagnosis of influenza from Nasopharyngeal swab specimens and should not be used as a sole basis for treatment. Nasal washings and aspirates are unacceptable for Xpert Xpress SARS-CoV-2/FLU/RSV testing.  Fact Sheet for Patients: BloggerCourse.com  Fact Sheet for Healthcare Providers: SeriousBroker.it  This test is not yet approved or cleared by the [place] FDA and has been authorized for detection and/or diagnosis of SARS-CoV-2 by FDA under an Emergency Use Authorization (EUA). This EUA will remain in effect (meaning this test can be used) for the duration of the COVID-19 declaration under Section 564(b)(1) of the Act, 21 U.S.C. section 360bbb-3(b)(1), unless the authorization is terminated or revoked.  Performed at [facility], [address] [place], [place] [address]   Blood culture (routine x 2)     Status: None   Collection Time: 09/09/20  1:46 PM   Specimen: BLOOD  Result Value Ref Range Status   Specimen Description BLOOD BLOOD LEFT FOREARM  Final   Special Requests   Final    BOTTLES DRAWN AEROBIC AND ANAEROBIC Blood Culture adequate volume   Culture   Final    NO GROWTH 5 DAYS Performed at [facility], [address], [place], [place]  [address]    Report Status 09/14/2020 FINAL  Final  Blood culture (routine x 2)     Status: Abnormal   Collection Time: 09/09/20  1:46 PM   Specimen: BLOOD  Result Value Ref Range Status   Specimen Description   Final    BLOOD LEFT ANTECUBITAL Performed at [facility], [address], [place], [place] [address]    Special Requests   Final    BOTTLES DRAWN AEROBIC AND ANAEROBIC Blood Culture adequate volume Performed at [facility], [address] [place], [place] [address]    Culture  Setup Time   Final    Organism ID to follow GRAM POSITIVE COCCI AEROBIC  BOTTLE ONLY CRITICAL RESULT CALLED TO, READ BACK BY AND VERIFIED WITH: Viviano SimasLIZ GANNON RN @1101  09/10/20 SCS Performed at [facility], [address] [place], [place] [address]    Culture (A)  Final    STAPHYLOCOCCUS EPIDERMIDIS THE SIGNIFICANCE OF ISOLATING THIS ORGANISM FROM A SINGLE SET OF BLOOD CULTURES WHEN MULTIPLE SETS ARE DRAWN IS UNCERTAIN. PLEASE NOTIFY THE MICROBIOLOGY DEPARTMENT WITHIN ONE WEEK IF SPECIATION AND SENSITIVITIES ARE REQUIRED. Performed at [facility], [address] [place], [place] [address]    Report Status 09/12/2020 FINAL  Final  Blood Culture ID Panel (Reflexed)     Status: Abnormal   Collection Time: 09/09/20  1:46 PM  Result Value Ref Range Status   Enterococcus faecalis NOT DETECTED NOT DETECTED Final   Enterococcus Faecium NOT DETECTED NOT DETECTED Final   Listeria monocytogenes NOT DETECTED NOT DETECTED Final   Staphylococcus species DETECTED (A) NOT DETECTED Final    Comment: CRITICAL RESULT CALLED TO, READ BACK BY AND VERIFIED WITH: Viviano SimasLIZ GANNON RN @1101  09/10/20 SCS    Staphylococcus aureus (BCID) NOT DETECTED NOT DETECTED Final   Staphylococcus epidermidis DETECTED (A) NOT DETECTED Final    Comment: CRITICAL RESULT CALLED TO, READ BACK BY AND VERIFIED WITH: Viviano SimasLIZ GANNON RN @1101  09/10/20 SCS    Staphylococcus lugdunensis NOT DETECTED NOT DETECTED Final    Streptococcus species NOT DETECTED NOT DETECTED Final   Streptococcus agalactiae NOT DETECTED NOT DETECTED Final   Streptococcus pneumoniae NOT DETECTED NOT DETECTED Final   Streptococcus pyogenes NOT DETECTED NOT DETECTED Final   A.calcoaceticus-baumannii NOT DETECTED NOT DETECTED Final   Bacteroides fragilis NOT DETECTED NOT DETECTED Final   Enterobacterales NOT DETECTED NOT DETECTED Final   Enterobacter cloacae complex NOT DETECTED NOT DETECTED Final   Escherichia coli NOT DETECTED NOT DETECTED Final   Klebsiella aerogenes NOT DETECTED NOT DETECTED Final   Klebsiella oxytoca NOT DETECTED NOT DETECTED Final   Klebsiella pneumoniae NOT DETECTED NOT DETECTED Final   Proteus species NOT DETECTED NOT DETECTED Final   Salmonella species NOT DETECTED NOT DETECTED Final   Serratia marcescens NOT DETECTED NOT DETECTED Final   Haemophilus influenzae NOT DETECTED NOT DETECTED Final   Neisseria meningitidis NOT DETECTED NOT DETECTED Final   Pseudomonas aeruginosa NOT DETECTED NOT DETECTED Final   Stenotrophomonas maltophilia NOT DETECTED NOT DETECTED Final   Candida albicans NOT DETECTED NOT DETECTED Final   Candida auris NOT DETECTED NOT DETECTED Final   Candida glabrata NOT DETECTED NOT DETECTED Final   Candida krusei NOT DETECTED NOT DETECTED Final   Candida parapsilosis NOT DETECTED NOT DETECTED Final   Candida tropicalis NOT DETECTED NOT DETECTED Final   Cryptococcus neoformans/gattii NOT DETECTED NOT DETECTED Final   Methicillin resistance mecA/C NOT DETECTED NOT DETECTED Final    Comment: Performed at [facility], [address] [place], [place] [address]     Studies: No results found.  Joycelyn DasLaxman Moniqua Engebretsen, MD  [facility] 09/14/2020  If 7PM-7AM, please contact night-coverage

## 2020-09-15 NOTE — Progress Notes (Signed)
PROGRESS NOTE  Darin Hopkins ZOX:[id number] DOB: [birth year] DOA: 09/09/2020 PCP: Martie RoundSpencer, Nicole, NP   LOS: 6 days   Brief narrative:  60 year old male with past medical history significant for hypertension, hyperlipidemia, heart failure with preserved ejection fraction, CVA, seizures noncompliant on medication, alcohol abuse, COPD on home oxygen noncompliant with, CKD stage III presented [facility] with episodes of seizures.  Patient was recently admitted to hospital from [id number] for presumed acute exacerbation of COPD.  Patient had 1 hour of seizure activity prior to coming to the hospital a was intubated for airway protection.  [facility] including LTM EEG and management of status epilepticus..  CT head scan was negative for acute findings.   Patient was subsequently considered stable for transfer out of the ICU.  Assessment/Plan:  Principal Problem:   Acute hypercapnic respiratory failure (HCC) Active Problems:   Acute exacerbation of CHF (congestive heart failure) (HCC)   Essential hypertension   History of seizure   COPD (chronic obstructive pulmonary disease) (HCC)   Hyperlipidemia   GERD (gastroesophageal reflux disease)   Alcohol abuse   Status epilepticus (HCC)   Breakthrough seizurewith concern for status epilepticus in the setting of EtOH use versus medication non-adherence, PriorR MCA territory stroke  Patient was initially intubated for airway protection.  Neurology was consulted.  On Keppra 750mg  twice daily at this time and will be continued on this dose on discharge.  Continuous EEG was performed which was negative for active seizures.  Neurology has recommended outpatient follow-[facility] in about 12 weeks.    Acutehypoxic/hypercapnic respiratory failure on the background of status epilepticus and underlying COPD.  Patient was initially intubated for hypoxia/ hypercapnia  and for airway protection.  Status post extubation and currently on oxygen by nasal cannula at his baseline requirement. Continue Pulmicort inhalers albuterol inhaler. He was advised compliance with his oxygen regimen at home.  Mild leukocytosis resolved. Reactive.  Essential hypertension, Chronic HFpEF Continue amlodipine, lisinopril, doxazosin, coreg on discharge.  Remained compensated.  Blood pressure is controlled.  Acute kidney injury onCKD stage IIIa Creatinine at baseline at this time. Lab Results  Component Value Date   CREATININE 1.35 (H) 09/14/2020   CREATININE 1.10 09/12/2020   CREATININE 1.13 09/10/2020    History of EtOH abuse Reportedly, patient was trying to stop drinking.    Urine drug screen was positive for benzodiazepines.  Continue thiamine, folic acid on discharge.  No withdrawal symptoms.  Hypertriglyceridemia.    Improved on repeat  Deconditioning, debility, patient states that he lives at home and has a walker for ambulation.    Physical therapy was consulted and recommended home health PT, OT on discharge but family concerned about discharge.  DVT prophylaxis: heparin injection 5,000 Units Start: 09/10/20 0600 SCDs Start: 09/09/20 1954   Code Status: Full code  Family Communication:  Spoke with the patient's sister on 5/7.  She is very concerned about the level of care the patient needs and wishes for rehabilitation.  Status is: Inpatient  Remains inpatient appropriate because:IV treatments appropriate due to intensity of illness or inability to take PO and Inpatient level of care appropriate due to severity of illness, unsafe disposition.   Dispo: The patient is from: Home              Anticipated d/c is to: Undetermined at this time .   home health versus a skilled nursing facility.  Spoke  with the interdisciplinary meeting.              Patient currently is not medically stable to d/c.   Difficult to place patient  No  Consultants:  PCCM  Neurology  Procedures:  EEG,   intubation and mechanical ventilation  Anti-infectives:  . None  Anti-infectives (From admission, onward)   None     Subjective: Today, patient feels okay.  Denies interval complaints.  Denies chest pain palpitation or increasing shortness of breath.  Objective: Vitals:   09/15/20 0546 09/15/20 0809  BP: 128/81 130/75  Pulse: (!) 58 67  Resp: 14 16  Temp: 98 F (36.7 C)   SpO2: 100%     Intake/Output Summary (Last 24 hours) at 09/15/2020 1309 Last data filed at 09/15/2020 0500 Gross per 24 hour  Intake 240 ml  Output 500 ml  Net -260 ml   Filed Weights   09/10/20 0500  Weight: 59.3 kg   Body mass index is 19.88 kg/m.   Physical Exam:  General:  Average built, not in obvious distress, on nasal cannula oxygen HENT:   No scleral pallor or icterus noted. Oral mucosa is moist.  Chest:    Diminished breath sounds bilaterally.  No overt wheezing noted CVS: S1 &S2 heard. No murmur.  Regular rate and rhythm. Abdomen: Soft, nontender, nondistended.  Bowel sounds are heard.   Extremities: No cyanosis, clubbing or edema.  Peripheral pulses are palpable. Psych: Alert, awake and oriented, normal mood CNS:  No cranial nerve deficits.  Power equal in all extremities.   Skin: Warm and dry.  No rashes noted.   Data Review: I have personally reviewed the following laboratory data and studies,  CBC: Recent Labs  Lab 09/09/20 1152 09/10/20 0337 09/10/20 0452 09/12/20 0150 09/14/20 0211  WBC 12.2*  --  14.6* 12.2* 8.2  NEUTROABS 5.1  --   --   --   --   HGB 11.4* 12.2* 12.8* 11.3* 10.8*  HCT 37.4* 36.0* 39.2 37.3* 36.2*  MCV 102.2*  --  100.3* 102.2* 103.1*  PLT 183  --  114* 159 145*   Basic Metabolic Panel: Recent Labs  Lab 09/09/20 1152 09/10/20 0337 09/10/20 0452 09/12/20 0150 09/14/20 0211  NA 145 146* 138 145 143  K 4.2 3.4* 5.1 4.3 4.1  CL 107  --  108 109 107  CO2 30  --  21* 32 31  GLUCOSE  84  --  75 90 75  BUN 36*  --  30* 23* 24*  CREATININE 1.38*  --  1.13 1.10 1.35*  CALCIUM 8.7*  --  8.2* 8.9 8.8*  MG 2.0  --  2.1 1.8  --    Liver Function Tests: Recent Labs  Lab 09/09/20 1152  AST 25  ALT 24  ALKPHOS 36*  BILITOT 0.6  PROT 6.9  ALBUMIN 3.8   No results for input(s): LIPASE, AMYLASE in the last 168 hours. No results for input(s): AMMONIA in the last 168 hours. Cardiac Enzymes: No results for input(s): CKTOTAL, CKMB, CKMBINDEX, TROPONINI in the last 168 hours. BNP (last 3 results) Recent Labs    08/17/20 1037 08/27/20 1304  BNP 1,306.1* 285.0*    ProBNP (last 3 results) No results for input(s): PROBNP in the last 8760 hours.  CBG: Recent Labs  Lab 09/09/20 1202  GLUCAP 79   Recent Results (from the past 240 hour(s))  Resp Panel by RT-PCR (Flu A&B, Covid) Nasopharyngeal Swab     Status: None  Collection Time: 09/09/20  1:46 PM   Specimen: Nasopharyngeal Swab; Nasopharyngeal(NP) swabs in vial transport medium  Result Value Ref Range Status   SARS Coronavirus 2 by RT PCR NEGATIVE NEGATIVE Final    Comment: (NOTE) SARS-CoV-2 target nucleic acids are NOT DETECTED.  The SARS-CoV-2 RNA is generally detectable in upper respiratory specimens during the acute phase of infection. The lowest concentration of SARS-CoV-2 viral copies this assay can detect is 138 copies/mL. A negative result does not preclude SARS-Cov-2 infection and should not be used as the sole basis for treatment or other patient management decisions. A negative result may occur with  improper specimen collection/handling, submission of specimen other than nasopharyngeal swab, presence of viral mutation(s) within the areas targeted by this assay, and inadequate number of viral copies(<138 copies/mL). A negative result must be combined with clinical observations, patient history, and epidemiological information. The expected result is Negative.  Fact Sheet for Patients:   BloggerCourse.com  Fact Sheet for Healthcare Providers:  SeriousBroker.it  This test is no t yet approved or cleared by the [place] FDA and  has been authorized for detection and/or diagnosis of SARS-CoV-2 by FDA under an Emergency Use Authorization (EUA). This EUA will remain  in effect (meaning this test can be used) for the duration of the COVID-19 declaration under Section 564(b)(1) of the Act, 21 U.S.C.section 360bbb-3(b)(1), unless the authorization is terminated  or revoked sooner.       Influenza A by PCR NEGATIVE NEGATIVE Final   Influenza B by PCR NEGATIVE NEGATIVE Final    Comment: (NOTE) The Xpert Xpress SARS-CoV-2/FLU/RSV plus assay is intended as an aid in the diagnosis of influenza from Nasopharyngeal swab specimens and should not be used as a sole basis for treatment. Nasal washings and aspirates are unacceptable for Xpert Xpress SARS-CoV-2/FLU/RSV testing.  Fact Sheet for Patients: BloggerCourse.com  Fact Sheet for Healthcare Providers: SeriousBroker.it  This test is not yet approved or cleared by the [place] FDA and has been authorized for detection and/or diagnosis of SARS-CoV-2 by FDA under an Emergency Use Authorization (EUA). This EUA will remain in effect (meaning this test can be used) for the duration of the COVID-19 declaration under Section 564(b)(1) of the Act, 21 U.S.C. section 360bbb-3(b)(1), unless the authorization is terminated or revoked.  Performed at [facility], [address] [place], [place] [address]   Blood culture (routine x 2)     Status: None   Collection Time: 09/09/20  1:46 PM   Specimen: BLOOD  Result Value Ref Range Status   Specimen Description BLOOD BLOOD LEFT FOREARM  Final   Special Requests   Final    BOTTLES DRAWN AEROBIC AND ANAEROBIC Blood Culture adequate volume   Culture   Final    NO  GROWTH 5 DAYS Performed at [facility], [address] [place], [place] [address]    Report Status 09/14/2020 FINAL  Final  Blood culture (routine x 2)     Status: Abnormal   Collection Time: 09/09/20  1:46 PM   Specimen: BLOOD  Result Value Ref Range Status   Specimen Description   Final    BLOOD LEFT ANTECUBITAL Performed at [facility], [address] [place], [place] [address]    Special Requests   Final    BOTTLES DRAWN AEROBIC AND ANAEROBIC Blood Culture adequate volume Performed at [facility], [address], [place], [place] [address]    Culture  Setup Time   Final  Organism ID to follow GRAM POSITIVE COCCI AEROBIC BOTTLE ONLY CRITICAL RESULT CALLED TO, READ BACK BY AND VERIFIED WITH: Viviano Simas RN @1101  09/10/20 SCS Performed at [facility], [address] [place], [place]    Culture (A)  Final    STAPHYLOCOCCUS EPIDERMIDIS THE SIGNIFICANCE OF ISOLATING THIS ORGANISM FROM A SINGLE SET OF BLOOD CULTURES WHEN MULTIPLE SETS ARE DRAWN IS UNCERTAIN. PLEASE NOTIFY THE MICROBIOLOGY DEPARTMENT WITHIN ONE WEEK IF SPECIATION AND SENSITIVITIES ARE REQUIRED. Performed at [facility], [address] [place], [place]    Report Status 09/12/2020 FINAL  Final  Blood Culture ID Panel (Reflexed)     Status: Abnormal   Collection Time: 09/09/20  1:46 PM  Result Value Ref Range Status   Enterococcus faecalis NOT DETECTED NOT DETECTED Final   Enterococcus Faecium NOT DETECTED NOT DETECTED Final   Listeria monocytogenes NOT DETECTED NOT DETECTED Final   Staphylococcus species DETECTED (A) NOT DETECTED Final    Comment: CRITICAL RESULT CALLED TO, READ BACK BY AND VERIFIED WITH: 11/09/20 RN @1101  09/10/20 SCS    Staphylococcus aureus (BCID) NOT DETECTED NOT DETECTED Final   Staphylococcus epidermidis DETECTED (A) NOT DETECTED Final    Comment: CRITICAL RESULT CALLED TO, READ BACK BY AND VERIFIED WITH:  RN @1101  09/10/20 SCS    Staphylococcus lugdunensis NOT DETECTED NOT DETECTED Final   Streptococcus species NOT DETECTED NOT DETECTED Final   Streptococcus agalactiae NOT DETECTED NOT DETECTED Final   Streptococcus pneumoniae NOT DETECTED NOT DETECTED Final   Streptococcus pyogenes NOT DETECTED NOT DETECTED Final   A.calcoaceticus-baumannii NOT DETECTED NOT DETECTED Final   Bacteroides fragilis NOT DETECTED NOT DETECTED Final   Enterobacterales NOT DETECTED NOT DETECTED Final   Enterobacter cloacae complex NOT DETECTED NOT DETECTED Final   Escherichia coli NOT DETECTED NOT DETECTED Final   Klebsiella aerogenes NOT DETECTED NOT DETECTED Final   Klebsiella oxytoca NOT DETECTED NOT DETECTED Final   Klebsiella pneumoniae NOT DETECTED NOT DETECTED Final   Proteus species NOT DETECTED NOT DETECTED Final   Salmonella species NOT DETECTED NOT DETECTED Final   Serratia marcescens NOT DETECTED NOT DETECTED Final   Haemophilus influenzae NOT DETECTED NOT DETECTED Final   Neisseria meningitidis NOT DETECTED NOT DETECTED Final   Pseudomonas aeruginosa NOT DETECTED NOT DETECTED Final   Stenotrophomonas maltophilia NOT DETECTED NOT DETECTED Final   Candida albicans NOT DETECTED NOT DETECTED Final   Candida auris NOT DETECTED NOT DETECTED Final   Candida glabrata NOT DETECTED NOT DETECTED Final   Candida krusei NOT DETECTED NOT DETECTED Final   Candida parapsilosis NOT DETECTED NOT DETECTED Final   Candida tropicalis NOT DETECTED NOT DETECTED Final   Cryptococcus neoformans/gattii NOT DETECTED NOT DETECTED Final   Methicillin resistance mecA/C NOT DETECTED NOT DETECTED Final    Comment: Performed at [facility], [address] [place], [facility] [address]     Studies: No results found.  [place], MD  [facility] 09/15/2020  If 7PM-7AM, please contact night-coverage

## 2020-09-15 NOTE — Progress Notes (Signed)
PT Cancellation Note  Patient Details Name: Darin Hopkins MRN: [id number] DOB: 1960/10/28   Cancelled Treatment:    Reason Eval/Treat Not Completed: Other (comment).  Pt is having a late meal and asked PT to come back at another time.  Follow up as time and pt allow.   Ivar Drape 09/15/2020, 2:59 PM Samul Dada, PT MS Acute Rehab Dept. Number: [facility] [id number] and [facility] [phone]

## 2020-09-16 NOTE — Progress Notes (Signed)
PROGRESS NOTE  Darin Hopkins [id number] DOB: 11-01-1960 DOA: 09/09/2020 PCP: Martie RoundSpencer, Nicole, NP   LOS: 7 days   Brief narrative:  60 year old male with past medical history significant for hypertension, hyperlipidemia, heart failure with preserved ejection fraction, CVA, seizures noncompliant on medication, alcohol abuse, COPD on home oxygen noncompliant with, CKD stage III presented to hospital at [facility] with episodes of seizures.  Patient was recently admitted to hospital from [id number] for presumed acute exacerbation of COPD.  Patient had 1 hour of seizure activity prior to coming to the hospital a was intubated for airway protection.  Patient was then transferred to [facility] for further care including LTM EEG and management of status epilepticus..  CT head scan was negative for acute findings.   Patient was subsequently considered stable for transfer out of the ICU.  Patient has remained stable after transfer from the ICU but appears to be very dyspneic and deconditioned.  Physical therapy has seen the patient and recommended home health PT but family strongly recommends rehabilitation for the patient.  Assessment/Plan:  Principal Problem:   Acute hypercapnic respiratory failure (HCC) Active Problems:   Acute exacerbation of CHF (congestive heart failure) (HCC)   Essential hypertension   History of seizure   COPD (chronic obstructive pulmonary disease) (HCC)   Hyperlipidemia   GERD (gastroesophageal reflux disease)   Alcohol abuse   Status epilepticus (HCC)   Breakthrough seizurewith concern for status epilepticus in the setting of EtOH use versus medication non-adherence, PriorR MCA territory stroke  Patient was initially intubated for airway protection.  Neurology was consulted.  On Keppra 750mg  twice daily at this time and will be continued on this dose on discharge.  Continuous EEG was performed which was negative for active seizures.  Neurology has recommended  outpatient follow-[facility] in about 12 weeks.    No further seizure like episodes reported after admission.  Acutehypoxic/hypercapnic respiratory failure on the background of status epilepticus and underlying COPD.  Patient was initially intubated for hypoxia/ hypercapnia and for airway protection.  Status post extubation and currently on oxygen by nasal cannula at his baseline requirement. Continue Pulmicort inhalers albuterol inhaler. He was advised compliance with his oxygen regimen at home.  Patient states that he is currently at his baseline.  Mild leukocytosis resolved. Reactive.  Essential hypertension, Chronic HFpEF Continue amlodipine, lisinopril, doxazosin, coreg on discharge.  Appears to be compensated at this time..  Blood pressure is controlled.  Acute kidney injury onCKD stage IIIa Creatinine at baseline at this time.  We will continue to monitor. Lab Results  Component Value Date   CREATININE 1.35 (H) 09/14/2020   CREATININE 1.10 09/12/2020   CREATININE 1.13 09/10/2020    History of EtOH abuse Reportedly, patient was trying to stop drinking.    Urine drug screen was positive for benzodiazepines.  Continue thiamine, folic acid. No withdrawal symptoms.  Hypertriglyceridemia.    Improved on repeat  Deconditioning, debility, patient states that he lives at home and has a walker for ambulation.    Physical therapy was consulted and recommended home health PT, OT on discharge but the patient's sister is highly concerned about discharge.  DVT prophylaxis: heparin injection 5,000 Units Start: 09/10/20 0600 SCDs Start: 09/09/20 1954   Code Status: Full code  Family Communication:  Spoke with the patient's sister on 09/16/20.  She is very concerned about the level of care the patient needs and strongly wishes for rehabilitation.  She states that that  she has been calling 911 every few days because of his medical condition.  He would not put  oxygen and remain compliant to medications at home and she is very scared about the seizures that he could have.  She states that her health is not good and she would not be able to take care of him at home.   Status is: Inpatient  Remains inpatient appropriate because:IV treatments appropriate due to intensity of illness or inability to take PO and Inpatient level of care appropriate due to severity of illness, unsafe disposition.   Dispo: The patient is from: Home              Anticipated d/c is to: Skilled nursing facility.  Transition of care has been updated              Patient currently is not medically stable to d/c.   Difficult to place patient No  Consultants:  PCCM  Neurology  Procedures:  EEG,   intubation and mechanical ventilation  Anti-infectives:  . None  Anti-infectives (From admission, onward)   None     Subjective: Today, patient feels okay.  Denies interval complaints.  Denies chest pain palpitation or increasing shortness of breath.  Objective: Vitals:   09/16/20 0700 09/16/20 1500  BP: (!) 158/94 119/83  Pulse: 88 67  Resp: 17 18  Temp: 98.4 F (36.9 C) 98.4 F (36.9 C)  SpO2: 98% 96%    Intake/Output Summary (Last 24 hours) at 09/16/2020 1558 Last data filed at 09/16/2020 0900 Gross per 24 hour  Intake 240 ml  Output 725 ml  Net -485 ml   Filed Weights   09/10/20 0500  Weight: 59.3 kg   Body mass index is 19.88 kg/m.   Physical Exam:  General:  Average built, not in obvious distress, on nasal cannula oxygen HENT:   No scleral pallor or icterus noted. Oral mucosa is moist.  Chest:    Diminished breath sounds bilaterally.  No overt wheezing noted CVS: S1 &S2 heard. No murmur.  Regular rate and rhythm. Abdomen: Soft, nontender, nondistended.  Bowel sounds are heard.   Extremities: No cyanosis, clubbing or edema.  Peripheral pulses are palpable. Psych: Alert, awake and oriented, normal mood CNS:  No cranial nerve deficits.  Power  equal in all extremities.   Skin: Warm and dry.  No rashes noted.   Data Review: I have personally reviewed the following laboratory data and studies,  CBC: Recent Labs  Lab 09/10/20 0337 09/10/20 0452 09/12/20 0150 09/14/20 0211  WBC  --  14.6* 12.2* 8.2  HGB 12.2* 12.8* 11.3* 10.8*  HCT 36.0* 39.2 37.3* 36.2*  MCV  --  100.3* 102.2* 103.1*  PLT  --  114* 159 145*   Basic Metabolic Panel: Recent Labs  Lab 09/10/20 0337 09/10/20 0452 09/12/20 0150 09/14/20 0211  NA 146* 138 145 143  K 3.4* 5.1 4.3 4.1  CL  --  108 109 107  CO2  --  21* 32 31  GLUCOSE  --  75 90 75  BUN  --  30* 23* 24*  CREATININE  --  1.13 1.10 1.35*  CALCIUM  --  8.2* 8.9 8.8*  MG  --  2.1 1.8  --    Liver Function Tests: No results for input(s): AST, ALT, ALKPHOS, BILITOT, PROT, ALBUMIN in the last 168 hours. No results for input(s): LIPASE, AMYLASE in the last 168 hours. No results for input(s): AMMONIA in the last 168 hours. Cardiac  Enzymes: No results for input(s): CKTOTAL, CKMB, CKMBINDEX, TROPONINI in the last 168 hours. BNP (last 3 results) Recent Labs    08/17/20 1037 08/27/20 1304  BNP 1,306.1* 285.0*    ProBNP (last 3 results) No results for input(s): PROBNP in the last 8760 hours.  CBG: No results for input(s): GLUCAP in the last 168 hours. Recent Results (from the past 240 hour(s))  Resp Panel by RT-PCR (Flu A&B, Covid) Nasopharyngeal Swab     Status: None   Collection Time: 09/09/20  1:46 PM   Specimen: Nasopharyngeal Swab; Nasopharyngeal(NP) swabs in vial transport medium  Result Value Ref Range Status   SARS Coronavirus 2 by RT PCR NEGATIVE NEGATIVE Final    Comment: (NOTE) SARS-CoV-2 target nucleic acids are NOT DETECTED.  The SARS-CoV-2 RNA is generally detectable in upper respiratory specimens during the acute phase of infection. The lowest concentration of SARS-CoV-2 viral copies this assay can detect is 138 copies/mL. A negative result does not preclude  SARS-Cov-2 infection and should not be used as the sole basis for treatment or other patient management decisions. A negative result may occur with  improper specimen collection/handling, submission of specimen other than nasopharyngeal swab, presence of viral mutation(s) within the areas targeted by this assay, and inadequate number of viral copies(<138 copies/mL). A negative result must be combined with clinical observations, patient history, and epidemiological information. The expected result is Negative.  Fact Sheet for Patients:  BloggerCourse.com  Fact Sheet for Healthcare Providers:  SeriousBroker.it  This test is no t yet approved or cleared by the [place] FDA and  has been authorized for detection and/or diagnosis of SARS-CoV-2 by FDA under an Emergency Use Authorization (EUA). This EUA will remain  in effect (meaning this test can be used) for the duration of the COVID-19 declaration under Section 564(b)(1) of the Act, 21 U.S.C.section 360bbb-3(b)(1), unless the authorization is terminated  or revoked sooner.       Influenza A by PCR NEGATIVE NEGATIVE Final   Influenza B by PCR NEGATIVE NEGATIVE Final    Comment: (NOTE) The Xpert Xpress SARS-CoV-2/FLU/RSV plus assay is intended as an aid in the diagnosis of influenza from Nasopharyngeal swab specimens and should not be used as a sole basis for treatment. Nasal washings and aspirates are unacceptable for Xpert Xpress SARS-CoV-2/FLU/RSV testing.  Fact Sheet for Patients: BloggerCourse.com  Fact Sheet for Healthcare Providers: SeriousBroker.it  This test is not yet approved or cleared by the [place] FDA and has been authorized for detection and/or diagnosis of SARS-CoV-2 by FDA under an Emergency Use Authorization (EUA). This EUA will remain in effect (meaning this test can be used) for the duration of  the COVID-19 declaration under Section 564(b)(1) of the Act, 21 U.S.C. section 360bbb-3(b)(1), unless the authorization is terminated or revoked.  Performed at [facility], [address] [place], [place] [address]   Blood culture (routine x 2)     Status: None   Collection Time: 09/09/20  1:46 PM   Specimen: BLOOD  Result Value Ref Range Status   Specimen Description BLOOD BLOOD LEFT FOREARM  Final   Special Requests   Final    BOTTLES DRAWN AEROBIC AND ANAEROBIC Blood Culture adequate volume   Culture   Final    NO GROWTH 5 DAYS Performed at [facility], [address], [place], [place] [address]    Report Status 09/14/2020 FINAL  Final  Blood culture (routine x 2)     Status: Abnormal  Collection Time: 09/09/20  1:46 PM   Specimen: BLOOD  Result Value Ref Range Status   Specimen Description   Final    BLOOD LEFT ANTECUBITAL Performed at [facility], [address] [place], [place] [address]    Special Requests   Final    BOTTLES DRAWN AEROBIC AND ANAEROBIC Blood Culture adequate volume Performed at [facility], [address] [place], [place] [address]    Culture  Setup Time   Final    Organism ID to follow GRAM POSITIVE COCCI AEROBIC BOTTLE ONLY CRITICAL RESULT CALLED TO, READ BACK BY AND VERIFIED WITH: Viviano Simas RN @1101  09/10/20 SCS Performed at [facility], [address] [place], [place]    Culture (A)  Final    STAPHYLOCOCCUS EPIDERMIDIS THE SIGNIFICANCE OF ISOLATING THIS ORGANISM FROM A SINGLE SET OF BLOOD CULTURES WHEN MULTIPLE SETS ARE DRAWN IS UNCERTAIN. PLEASE NOTIFY THE MICROBIOLOGY DEPARTMENT WITHIN ONE WEEK IF SPECIATION AND SENSITIVITIES ARE REQUIRED. Performed at [facility], [address] [place], [place]    Report Status 09/12/2020 FINAL  Final  Blood Culture ID Panel (Reflexed)     Status: Abnormal   Collection Time: 09/09/20  1:46 PM  Result Value Ref Range  Status   Enterococcus faecalis NOT DETECTED NOT DETECTED Final   Enterococcus Faecium NOT DETECTED NOT DETECTED Final   Listeria monocytogenes NOT DETECTED NOT DETECTED Final   Staphylococcus species DETECTED (A) NOT DETECTED Final    Comment: CRITICAL RESULT CALLED TO, READ BACK BY AND VERIFIED WITH: 11/09/20 RN @1101  09/10/20 SCS    Staphylococcus aureus (BCID) NOT DETECTED NOT DETECTED Final   Staphylococcus epidermidis DETECTED (A) NOT DETECTED Final    Comment: CRITICAL RESULT CALLED TO, READ BACK BY AND VERIFIED WITH: RN @1101  09/10/20 SCS    Staphylococcus lugdunensis NOT DETECTED NOT DETECTED Final   Streptococcus species NOT DETECTED NOT DETECTED Final   Streptococcus agalactiae NOT DETECTED NOT DETECTED Final   Streptococcus pneumoniae NOT DETECTED NOT DETECTED Final   Streptococcus pyogenes NOT DETECTED NOT DETECTED Final   A.calcoaceticus-baumannii NOT DETECTED NOT DETECTED Final   Bacteroides fragilis NOT DETECTED NOT DETECTED Final   Enterobacterales NOT DETECTED NOT DETECTED Final   Enterobacter cloacae complex NOT DETECTED NOT DETECTED Final   Escherichia coli NOT DETECTED NOT DETECTED Final   Klebsiella aerogenes NOT DETECTED NOT DETECTED Final   Klebsiella oxytoca NOT DETECTED NOT DETECTED Final   Klebsiella pneumoniae NOT DETECTED NOT DETECTED Final   Proteus species NOT DETECTED NOT DETECTED Final   Salmonella species NOT DETECTED NOT DETECTED Final   Serratia marcescens NOT DETECTED NOT DETECTED Final   Haemophilus influenzae NOT DETECTED NOT DETECTED Final   Neisseria meningitidis NOT DETECTED NOT DETECTED Final   Pseudomonas aeruginosa NOT DETECTED NOT DETECTED Final   Stenotrophomonas maltophilia NOT DETECTED NOT DETECTED Final   Candida albicans NOT DETECTED NOT DETECTED Final   Candida auris NOT DETECTED NOT DETECTED Final   Candida glabrata NOT DETECTED NOT DETECTED Final   Candida krusei NOT DETECTED NOT DETECTED Final   Candida parapsilosis  NOT DETECTED NOT DETECTED Final   Candida tropicalis NOT DETECTED NOT DETECTED Final   Cryptococcus neoformans/gattii NOT DETECTED NOT DETECTED Final   Methicillin resistance mecA/C NOT DETECTED NOT DETECTED Final    Comment: Performed at [facility], [address] [place], [facility] [address]     Studies: No results found.  [place], MD  [facility]  09/16/2020  If 7PM-7AM, please contact night-coverage

## 2020-09-16 NOTE — TOC Progression Note (Signed)
Transition of Care [facility]) - Progression Note    Patient Details  Name: Darin Hopkins MRN: [id number] Date of Birth: 1960-06-20  Transition of Care [facility]) CM/SW Contact  Mearl Latin, LCSW Phone Number: 09/16/2020, 5:06 PM  Clinical Narrative:    CSW spoke with patient's sister regarding her concern for patient discharging home.  She stated he does well at the hospital and then comes home and is not compliant. She fears he will have a seizure during the night and she will not know and she is in poor health herself. She is requesting rehab but does not want patient to go anywhere long term. CSW explained that patient does not meet SNF criteria at this time and that even a SNF would not be checking on the patient very often due to staffing shortages. CSW explained that if he takes his seizure medication and oxygen as prescribed and does not drink alcohol, there is less risk of having another seizure. She stated understanding and agreed that he needs to quit. Patient's Medicaid does not pay for therapy, only skilled care. CSW suggested an ALF but she stated she does not want to "put him away" and promised their mother she would care for him. It may be helpful to add a Child psychotherapist on to the home health orders to further investigate ALF placement. She is requesting to know how often patient needs to wear the oxygen and he will need transportation home (likely [facility] since patient requires oxygen).    Expected Discharge Plan: Home w Home Health Services Barriers to Discharge: No Barriers Identified  Expected Discharge Plan and Services Expected Discharge Plan: Home w Home Health Services     Post Acute Care Choice: Home Health Living arrangements for the past 2 months: Single Family Home Expected Discharge Date: 09/12/20                         [facility] Arranged: PT,OT,Speech Therapy HH Agency: [facility] Date [facility] Agency Contacted: 09/12/20 Time HH Agency Contacted:  1425 Representative spoke with at [facility] Agency: Lorenza Chick   Social Determinants of Health (SDOH) Interventions    Readmission Risk Interventions No flowsheet data found.

## 2020-09-16 NOTE — Progress Notes (Signed)
Physical Therapy Treatment Patient Details Name: Darin Hopkins MRN: [id number] DOB: [birth year] Today's Date: 09/16/2020    History of Present Illness 60 y.o. male admitted on 09/09/20 for seizure for one hour at home (presented initially to [facility] ED).  He was intubated in the ED.  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  EEG ordered showed moderate diffuse encephalopathy, but no epileptiform discharges.  Pt was transferred to [facility] for LTM EEG (also negative for seizure) and further management of seizure.  Pt dx with breakthrough seizure with concern for status epilepticus in the setting of ETOH vs medication non-adherence.  Also dx with acute hypoxic/hypercapnic respiratory failure (underlying COPD), mild leukocytosis, AKI, hypertriglyceridemia (at risk for pancreatitis).  Pt was extubated on 09/10/20.  Pt with other significant PMH of HTN.    PT Comments    Pt presents with eagerness to move OOB for PT session.  Performed transfer from bed, recliner and commode chair with supervision.  Pt prequired min guard for safety with use of RW.  Plan for HHPT remains appopriate.  Discussed energy conservation and rest breaks when feeling SHOB as he continues to drop with activity but with pursed lip breathing and rest breaks able to recover greater than 90% on 4L.     Follow Up Recommendations  Home health PT;Other (comment)     Equipment Recommendations  None recommended by PT    Recommendations for Other Services       Precautions / Restrictions Precautions Precautions: Fall Precaution Comments: Residual left sided weakness from previous CVA - pt reports this is baseline for him Restrictions Weight Bearing Restrictions: No    Mobility  Bed Mobility Overal bed mobility: Modified Independent                  Transfers Overall transfer level: Needs assistance Equipment used: Rolling walker (2 wheeled) Transfers: Sit to/from  Stand Sit to Stand: Supervision         General transfer comment: Cues for safety and hand placement  Ambulation/Gait Ambulation/Gait assistance: Min guard Gait Distance (Feet): 100 Feet (x2) Assistive device: Rolling walker (2 wheeled) Gait Pattern/deviations: Step-through pattern;Decreased dorsiflexion - left;Decreased step length - left Gait velocity: mildly decreased   General Gait Details: Pt required cues for safe position in RW and lowered height of RW for improved fit.  Able to progress gt distance this session.  SPO2 85%-90% recovered on 4L with rest break.   Stairs Stairs: Yes Stairs assistance: Supervision Stair Management: Two rails;Step to pattern;Forwards Number of Stairs: 8 General stair comments: Cues for safety/   Wheelchair Mobility    Modified Rankin (Stroke Patients Only)       Balance Overall balance assessment: Needs assistance   Sitting balance-Leahy Scale: Good       Standing balance-Leahy Scale: Fair Standing balance comment: able to static stand without UE support.                            Cognition Arousal/Alertness: Awake/alert Behavior During Therapy: WFL for tasks assessed/performed Overall Cognitive Status: No family/caregiver present to determine baseline cognitive functioning                                 General Comments: Pt demonstrates deficits with memory, problem solving, and safety awareness.  Uncertain of his baseline  Exercises      General Comments        Pertinent Vitals/Pain      Home Living                      Prior Function            PT Goals (current goals can now be found in the care plan section) Acute Rehab PT Goals Patient Stated Goal: to go home today Potential to Achieve Goals: Good Progress towards PT goals: Progressing toward goals    Frequency    Min 3X/week      PT Plan Current plan remains appropriate    Co-evaluation               AM-PAC PT "6 Clicks" Mobility   Outcome Measure  Help needed turning from your back to your side while in a flat bed without using bedrails?: A Little Help needed moving from lying on your back to sitting on the side of a flat bed without using bedrails?: A Little Help needed moving to and from a bed to a chair (including a wheelchair)?: A Little Help needed standing up from a chair using your arms (e.g., wheelchair or bedside chair)?: A Little Help needed to walk in hospital room?: A Little Help needed climbing 3-5 steps with a railing? : A Little 6 Click Score: 18    End of Session Equipment Utilized During Treatment: Gait belt Activity Tolerance: Patient limited by fatigue Patient left: in chair;with call bell/phone within reach;with chair alarm set Nurse Communication: Mobility status;Other (comment) (decreased SPo2.) PT Visit Diagnosis: Unsteadiness on feet (R26.81);Muscle weakness (generalized) (M62.81);Difficulty in walking, not elsewhere classified (R26.2);Other symptoms and signs involving the nervous system (R29.898)     Time: 1321-1350 PT Time Calculation (min) (ACUTE ONLY): 29 min  Charges:  $Gait Training: 8-22 mins $Therapeutic Activity: 8-22 mins                     Bonney Leitz , PTA Acute Rehabilitation Services Pager [phone] Office [phone]     Slayde Brault Artis Delay 09/16/2020, 2:41 PM

## 2020-09-17 NOTE — TOC Transition Note (Addendum)
Transition of Care [facility]) - CM/SW Discharge Note   Patient Details  Name: Darin Hopkins MRN: [id number] Date of Birth: August 21, 1960  Transition of Care [facility]) CM/SW Contact:  Epifanio Lesches, RN Phone Number: 09/17/2020, 10:49 AM   Clinical Narrative:     Patient will DC to: home with sister [name] Anticipated DC date: 09/17/2020 Family notified: yes, [name] Transport by: Sharin Mons   Admitted with  Acute hypercapnic respiratory failure .  Per MD patient ready for DC today . RN, patient, patient's sister, and [facility] notified of DC.   Transportation forms place on front of chart. Sister to receive pt after her MD appointments today.  Ambulance transport requested for patient, time pickup for 4:30 pm. Pt without Rx med concerns. Post hospital f/u appointments noted on AVS.  Ovidio Kin (Sister)     [phone]       RNCM will sign off for now as intervention is no longer needed. Please consult [place] again if new needs arise.   Final next level of care: Home w Home Health Services Barriers to Discharge: No Barriers Identified   Patient Goals and CMS Choice   CMS Medicare.gov Compare Post Acute Care list provided to:: Patient Represenative (must comment) Choice offered to / list presented to : Patient,Sibling  Discharge Placement                       Discharge Plan and Services     Post Acute Care Choice: Home Health                    HH Arranged: PT,OT,Speech Therapy HH Agency: [facility] Date [facility] Agency Contacted: 09/12/20 Time HH Agency Contacted: 1425 Representative spoke with at [facility] Agency: Lorenza Chick  Social Determinants of Health (SDOH) Interventions     Readmission Risk Interventions No flowsheet data found.

## 2020-09-17 NOTE — Plan of Care (Signed)
°  Problem: Education: °Goal: Expressions of having a comfortable level of knowledge regarding the disease process will increase °Outcome: Adequate for Discharge °  °Problem: Coping: °Goal: Ability to adjust to condition or change in health will improve °Outcome: Adequate for Discharge °Goal: Ability to identify appropriate support needs will improve °Outcome: Adequate for Discharge °  °Problem: Health Behavior/Discharge Planning: °Goal: Compliance with prescribed medication regimen will improve °Outcome: Adequate for Discharge °  °Problem: Medication: °Goal: Risk for medication side effects will decrease °Outcome: Adequate for Discharge °  °Problem: Clinical Measurements: °Goal: Complications related to the disease process, condition or treatment will be avoided or minimized °Outcome: Adequate for Discharge °Goal: Diagnostic test results will improve °Outcome: Adequate for Discharge °  °Problem: Safety: °Goal: Verbalization of understanding the information provided will improve °Outcome: Adequate for Discharge °  °Problem: Self-Concept: °Goal: Level of anxiety will decrease °Outcome: Adequate for Discharge °Goal: Ability to verbalize feelings about condition will improve °Outcome: Adequate for Discharge °  °

## 2020-09-17 NOTE — Progress Notes (Signed)
Physical Therapy Treatment Patient Details Name: Darin Hopkins MRN: [id number] DOB: February 08, 1961 Today's Date: 09/17/2020    History of Present Illness 60 y.o. male admitted on 09/09/20 for seizure for one hour at home (presented initially to [facility] ED).  He was intubated in the ED.  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  EEG ordered showed moderate diffuse encephalopathy, but no epileptiform discharges.  Pt was transferred to [facility] for LTM EEG (also negative for seizure) and further management of seizure.  Pt dx with breakthrough seizure with concern for status epilepticus in the setting of ETOH vs medication non-adherence.  Also dx with acute hypoxic/hypercapnic respiratory failure (underlying COPD), mild leukocytosis, AKI, hypertriglyceridemia (at risk for pancreatitis).  Pt was extubated on 09/10/20.  Pt with other significant PMH of HTN.    PT Comments    Pt was seen for mobility on RW with cues for safety and work on endurance training with monitoring of L foot drop.  Pt is appropriate for home therapy since he is able to be assisted at home, but will have PT instructing follow up to perform exercises when PT is not there.  He is unsafe to walk alone but does have some help for where he lacks complete safety awareness.  If his help at home changes, will need to consider a rehab placement for safety with walker and to increase automatic balance reactions.  Follow for acute PT goals.  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 - pt reports this is baseline for him Restrictions Weight Bearing Restrictions: No    Mobility  Bed Mobility Overal bed mobility: Needs Assistance        Supine to sit: Supervision Sit to supine: Supervision   General bed mobility comments: pt returned to bed after walk, declines to sit up inchair    Transfers Overall transfer level: Needs assistance Equipment used: Rolling walker (2 wheeled) Transfers: Sit to/from Stand Sit to Stand: Supervision         General transfer comment: reminded pt to use bed for push off, and to use walker only once standing  Ambulation/Gait Ambulation/Gait assistance: Min guard Gait Distance (Feet): 200 Feet Assistive device: Rolling walker (2 wheeled) Gait Pattern/deviations: Step-through pattern;Decreased dorsiflexion - left;Decreased step length - left Gait velocity: mildly decreased Gait velocity interpretation: <1.31 ft/sec, indicative of household ambulator General Gait Details: pt was 95% sat with gait on 4L O2, used RW with slow pace to manage drop foot on LLE   Stairs             Wheelchair Mobility    Modified Rankin (Stroke Patients Only)       Balance Overall balance assessment: Needs assistance Sitting-balance support: Feet supported;Bilateral upper extremity supported Sitting balance-Leahy Scale: Good     Standing balance support: Bilateral upper extremity supported;During functional activity Standing balance-Leahy Scale: Fair Standing balance comment: less than fair dynamic balance                            Cognition Arousal/Alertness: Awake/alert Behavior During Therapy: WFL for tasks assessed/performed Overall Cognitive Status: No family/caregiver present to determine baseline cognitive functioning  General Comments: pt is fixated on avoiding sitting OOB in chair      Exercises      General Comments General comments (skin integrity, edema, etc.): pt was seen for mobility on walker and was able to cover walk with one very short standing rest      Pertinent Vitals/Pain Pain Assessment:  Faces Faces Pain Scale: No hurt    Home Living                      Prior Function            PT Goals (current goals can now be found in the care plan section) Acute Rehab PT Goals Patient Stated Goal: to go home today Progress towards PT goals: Progressing toward goals    Frequency    Min 3X/week      PT Plan Current plan remains appropriate    Co-evaluation              AM-PAC PT "6 Clicks" Mobility   Outcome Measure  Help needed turning from your back to your side while in a flat bed without using bedrails?: A Little Help needed moving from lying on your back to sitting on the side of a flat bed without using bedrails?: A Little Help needed moving to and from a bed to a chair (including a wheelchair)?: A Little Help needed standing up from a chair using your arms (e.g., wheelchair or bedside chair)?: A Little Help needed to walk in hospital room?: A Little Help needed climbing 3-5 steps with a railing? : A Little 6 Click Score: 18    End of Session Equipment Utilized During Treatment: Gait belt Activity Tolerance: Patient limited by fatigue Patient left: with call bell/phone within reach;in bed;with bed alarm set Nurse Communication: Mobility status;Other (comment) (O2 sats) PT Visit Diagnosis: Unsteadiness on feet (R26.81);Muscle weakness (generalized) (M62.81);Difficulty in walking, not elsewhere classified (R26.2);Other symptoms and signs involving the nervous system (W29.562)     Time: 1308-6578 PT Time Calculation (min) (ACUTE ONLY): 18 min  Charges:  $Gait Training: 8-22 mins               Ivar Drape 09/17/2020, 8:32 PM  Samul Dada, PT MS Acute Rehab Dept. Number: [facility] [id number] and [facility] [phone]

## 2020-09-17 NOTE — Progress Notes (Signed)
PTAR has arrived at the bedside

## 2020-09-17 NOTE — Progress Notes (Signed)
Pt has all of his belongings except for sneakers. Spoke with day shift nurse Amil Amen, RN). She spoke to staff on floor the pt was on previously and they did not have pt's sneakers. They were also not found in his room.

## 2020-09-17 NOTE — Discharge Summary (Signed)
Physician Discharge Summary  Darin Hopkins ZOX:[id number] DOB: [birth year] DOA: 09/09/2020  PCP: Martie Round, NP  Admit date: 09/09/2020 Discharge date: 09/17/2020  Admitted From: Home Disposition: Home  Recommendations for Outpatient Follow-up:  1. Follow up with PCP in 1 week 2. Please follow up on the following pending results: None  Home Health: PT, OT, CSW Equipment/Devices: Elevated commode, shower stool  Discharge Condition: Stable CODE STATUS: Full code Diet recommendation: Regular diet   Brief/Interim Summary:  Admission HPI written by Cloyd Stagers, PA-C   History of Present Illness: 60 year old male with PMHx significant for HTN, HLD, HFpEF (EF 60-65% 08/2020), R MCA CVA, seizures (on Keppra, reportedly nonadherent with medications), EtOH abuse, COPD (on home O2, noncompliant), CKD stage III who presented as a transfer from [facility] for management of status epilepticus presumed 2/2 EtOH versus medication nonadherence. Of note, patient was recently discharged after being hospitalized 4/20-4/26 for presumed AECOPD.  Patient initially presented to [facility] ED via EMS 5/3 with ~1 hour of seizure activity at home prior to EMS contact; per EMS continued to seize en route to ED and continued for several hours after presentation. On arrival, patient was intubated as he was in presumed status epilepticus with AMS and unable to protect his airway. Per chart review (Neurology - Dr. Iver Nestle), at the time of presentation patient had a rhythmic leftward gaze and L arm jerking/twitching. Around 1700 5/3, neurologic exam was reportedly improved with increased responsiveness; however, patient was noted to be bradycardic to 40s and sedation was decreased. CT Head was negative for acute process and showed stable old R MCA territory infarct. STAT EEG obtained at [facility] demonstrated moderate diffuse encephalopathy but no epileptiform discharges.  Patient was transferred to [facility] for LTM EEG and  further management of status epilepticus.   Hospital course:  Breakthrough seizure Concern for status epilepticus on admission.  Patient was transferred from [facility] for continuous EEG which was negative for active seizures.  Patient's Keppra was increased from Keppra 500 mg twice daily to Keppra 750 mg twice daily.  Patient without recurrent seizure activity.  Acute on chronic respiratory failure with hypoxia Acute respiratory failure with hypercapnia Patient required intubation secondary to hypoxia/hypercapnia and poor airway protection. He required mechanical ventilation from May 3 until May 4 when he was extubated. Stable on home oxygen of 3 Lpm.  Mild leukocytosis Stress related.  Resolved.  Primary hypertension Discharged on Coreg 25 mg twice daily, amlodipine 10 mg daily, lisinopril 5 mg daily  Chronic diastolic heart failure Discharged on lisinopril 5 mg daily, Coreg 25 mg twice daily, Lasix 60 mg daily.  CKD stage IIIa Baseline creatinine of about 1.4-1.5.  Patient with a creatinine of 1.38 on admission.  Patient did not meet criteria for AKI  History of ethanol abuse Preparation, patient has ceased alcohol consumption.  He reports not needing any resources on discharge.  Hypertriglyceridemia Continue Lipitor 20 mg daily on discharge  Discharge Diagnoses:  Principal Problem:   Acute hypercapnic respiratory failure (HCC) Active Problems:   Acute exacerbation of CHF (congestive heart failure) (HCC)   Essential hypertension   History of seizure   COPD (chronic obstructive pulmonary disease) (HCC)   Hyperlipidemia   GERD (gastroesophageal reflux disease)   Alcohol abuse   Status epilepticus [facility])    Discharge Instructions  Discharge Instructions    Diet general   Complete by: As directed    Discharge instructions   Complete by: As directed    Follow-up with your primary  care physician in 1 week.  Please continue to take seizure medication dose has been increased.   No driving.  Continue to use oxygen at home.   Increase activity slowly   Complete by: As directed    Increase activity slowly   Complete by: As directed      Allergies as of 09/17/2020   No Known Allergies     Medication List    STOP taking these medications   predniSONE 10 MG tablet Commonly known as: DELTASONE     TAKE these medications   amLODipine 10 MG tablet Commonly known as: NORVASC Take 10 mg by mouth every evening.   aspirin 81 MG EC tablet Take 81 mg by mouth in the morning.   atorvastatin 20 MG tablet Commonly known as: LIPITOR Take 20 mg by mouth every evening.   carvedilol 25 MG tablet Commonly known as: COREG Take 1 tablet (25 mg total) by mouth 2 (two) times daily with a meal. What changed:   how much to take  when to take this   doxazosin 2 MG tablet Commonly known as: CARDURA Take 2 mg by mouth every evening.   folic acid 1 MG tablet Commonly known as: FOLVITE Take 1 mg by mouth daily.   furosemide 20 MG tablet Commonly known as: LASIX Take 3 tablets (60 mg total) by mouth daily.   levETIRAcetam 750 MG tablet Commonly known as: KEPPRA Take 1 tablet (750 mg total) by mouth 2 (two) times daily. What changed:   medication strength  how much to take   lisinopril 5 MG tablet Commonly known as: ZESTRIL Take 5 mg by mouth daily.   Multivitamin Adult Tabs Take 2 tablets by mouth daily.   omeprazole 20 MG capsule Commonly known as: PRILOSEC Take 20 mg by mouth daily before breakfast.   ProAir HFA 108 (90 Base) MCG/ACT inhaler Generic drug: albuterol Inhale 2 puffs into the lungs every 6 (six) hours as needed for shortness of breath or wheezing.   Spiriva HandiHaler 18 MCG inhalation capsule Generic drug: tiotropium Place 1 capsule into inhaler and inhale daily.   thiamine 100 MG tablet Take 1 tablet (100 mg total) by mouth daily.   Vitamin D-3 25 MCG (1000 UT) Caps Take 1,000 Units by mouth in the morning.             Durable Medical Equipment  (From admission, onward)         Start     Ordered   09/12/20 1554  For home use only DME Shower stool  Once        09/12/20 1554   09/12/20 1554  For home use only DME Eelevated commode seat  Once        09/12/20 1554          Follow-up Information    Martie Round, NP. Schedule an appointment as soon as possible for a visit in 1 week(s).   Specialty: Nurse Practitioner Why: regular followup Contact information: [address] [place] [address] [phone]        [facility] Follow up.   Specialty: Home Health Services Why: A representative from [facility] will contact you to arrange start date and time for your therapy Contact information: [address] [place] [address] [phone]        [facility] Follow up.   Why: follow up with neurology Contact information: [address] [place] [address] [phone]  No Known Allergies  Consultations:  PCCM  Neurology   Procedures/Studies: DG Chest 2 View  Result Date: 08/27/2020 CLINICAL DATA:  Cough, shortness of breath. EXAM: CHEST - 2 VIEW COMPARISON:  August 19, 2020. FINDINGS: The heart size and mediastinal contours are within normal limits. Both lungs are clear. No pneumothorax or pleural effusion is noted. The visualized skeletal structures are unremarkable. IMPRESSION: No active cardiopulmonary disease. Electronically Signed   By: Lupita Raider M.D.   On: 08/27/2020 11:59   CT Head Wo Contrast  Result Date: 09/09/2020 CLINICAL DATA:  Pt brought in via EMS from home for seizure like activity. Family states pt seized approx 1 hour prior to EMS. EMS reports pt seizing on arrival. EXAM: CT HEAD WITHOUT CONTRAST TECHNIQUE: Contiguous axial images were obtained from the base of the skull through the vertex without intravenous contrast. COMPARISON:  08/27/2020 FINDINGS: Brain: No evidence of acute  infarction, hemorrhage, hydrocephalus, extra-axial collection or mass lesion/mass effect. Encephalomalacia walls lateral right frontal lobe and right basal and external capsule/insular ribbon consistent with an old infarct, stable from the prior head CT. Mild ex vacuo dilation of the right lateral ventricle. Small old lacunar infarct in the left basal ganglia. Additional areas of patchy white matter hypoattenuation also noted in stable consistent moderate chronic microvascular ischemic change. Vascular: No hyperdense vessel or unexpected calcification. Skull: Normal. Negative for fracture or focal lesion. Sinuses/Orbits: Globes and orbits are unremarkable. Mild ethmoid and minor inferior frontal maxillary sinus mucosal thickening. Other: None. IMPRESSION: 1. No acute intracranial abnormalities. 2. 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 Electronically Signed   By: Amie Portland M.D.   On: 09/09/2020 14:45   CT Head Wo Contrast  Result Date: 08/27/2020 CLINICAL DATA:  Delirium. EXAM: CT HEAD WITHOUT CONTRAST TECHNIQUE: Contiguous axial images were obtained from the base of the skull through the vertex without intravenous contrast. COMPARISON:  November 01, 2013. 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 No mass effect or midline shift is noted. Ventricular size is within normal limits. There is no evidence of mass lesion, hemorrhage or acute infarction. Vascular: No hyperdense vessel or unexpected calcification. Skull: Normal. Negative for fracture or focal lesion. Sinuses/Orbits: No acute finding. Other: None. IMPRESSION: No acute intracranial abnormality seen. Electronically Signed   By: Lupita Raider M.D.   On: 08/27/2020 12:52   CT Chest High Resolution  Result Date: 08/20/2020 CLINICAL DATA:  60 year old male with history of hypoxemia. Evaluate for  interstitial lung disease. EXAM: CT CHEST WITHOUT CONTRAST TECHNIQUE: Multidetector CT imaging of the chest was performed following the standard protocol without intravenous contrast. High resolution imaging of the lungs, as well as inspiratory and expiratory imaging, was performed. COMPARISON:  No priors. FINDINGS: Cardiovascular: Heart size is normal. There is no significant pericardial fluid, thickening or pericardial calcification. There is aortic atherosclerosis, as well as atherosclerosis of the great vessels of the mediastinum and the coronary arteries, including calcified atherosclerotic plaque in the left main, left anterior descending, left circumflex and right coronary arteries. Calcifications of the aortic valve. Mediastinum/Nodes: No pathologically enlarged mediastinal or hilar lymph nodes. Please note that accurate exclusion of hilar adenopathy is limited on noncontrast CT scans. Esophagus is unremarkable in appearance. No axillary lymphadenopathy. Lungs/Pleura: Diffuse bronchial wall thickening with mild to moderate centrilobular and paraseptal emphysema. High-resolution images demonstrate no definite regions of ground-glass attenuation, septal thickening, subpleural reticulation, traction bronchiectasis  or frank honeycombing to indicate interstitial lung disease. Inspiratory and expiratory imaging is unremarkable in appearance. Small bilateral pleural effusions with areas of dependent subsegmental atelectasis in the lower lobes of the lungs bilaterally. Upper Abdomen: Aortic atherosclerosis. Musculoskeletal: There are no aggressive appearing lytic or blastic lesions noted in the visualized portions of the skeleton. IMPRESSION: 1. No findings to suggest interstitial lung disease. 2. Diffuse bronchial wall thickening with mild to moderate centrilobular and paraseptal emphysema; imaging findings suggestive of underlying COPD. 3. Small bilateral pleural effusions with bibasilar areas of subsegmental  atelectasis in the lower lobes of the lungs bilaterally. 4. Aortic atherosclerosis, in addition to left main and 3 vessel coronary artery disease. Please note that although the presence of coronary artery calcium documents the presence of coronary artery disease, the severity of this disease and any potential stenosis cannot be assessed on this non-gated CT examination. Assessment for potential risk factor modification, dietary therapy or pharmacologic therapy may be warranted, if clinically indicated. 5. There are calcifications of the aortic valve. Echocardiographic correlation for evaluation of potential valvular dysfunction may be warranted if clinically indicated. Aortic Atherosclerosis (ICD10-I70.0). Electronically Signed   By: Trudie Reed M.D.   On: 08/20/2020 20:51   NM Myocar Multi W/Spect W/Wall Motion / EF  Result Date: 08/22/2020  Low risk, probably normal pharmacologic myocardial perfusion stress test.  There is a small in size, moderate in severity, fixed basal inferior defect most likely representing artifact but cannot rule out a small area of scar.  There is no evidence of significant ischemia.  The left ventricular ejection fraction is normal (64%).  Coronary artery calcifications are present. Small bilateral pleural effusion are noted on the attenuation correction CT.    DG Chest Port 1 View  Result Date: 09/10/2020 CLINICAL DATA:  COPD. EXAM: PORTABLE CHEST 1 VIEW COMPARISON:  09/09/2020 FINDINGS: Endotracheal tube and NG tube in stable position. Cardiomegaly. No pulmonary venous congestion. Mild atelectasis right lung base. Prominent skin fold noted on the left. No pneumothorax identified. IMPRESSION: 1.  Stable cardiomegaly. 2.  Mild atelectasis right lung base. Electronically Signed   By: Maisie Fus  Register   On: 09/10/2020 05:49   DG Chest Portable 1 View  Result Date: 09/09/2020 CLINICAL DATA:  Seizures. EXAM: PORTABLE CHEST 1 VIEW COMPARISON:  08/27/2020. FINDINGS:  Endotracheal to and NG tube noted good anatomic position. Heart size normal. No focal infiltrate. No pleural effusion. Costophrenic angles incompletely imaged. No pneumothorax. Carotid vascular calcification. IMPRESSION: 1.  Endotracheal tube and NG tube in stable position. 2.  No acute cardiopulmonary disease. 3.  Carotid vascular disease. Electronically Signed   By: Maisie Fus  Register   On: 09/09/2020 12:52   DG Chest Port 1 View  Result Date: 08/19/2020 CLINICAL DATA:  Respiratory failure EXAM: PORTABLE CHEST 1 VIEW COMPARISON:  08/17/2020 FINDINGS: Cardiac shadow is enlarged but stable somewhat accentuated by the frontal technique. Increasing bibasilar opacity is noted consistent with focal infiltrate and small effusion. IMPRESSION: Increasing bibasilar airspace opacities with associated effusions. Electronically Signed   By: Alcide Clever M.D.   On: 08/19/2020 10:48   EEG adult  Result Date: 09/09/2020 Charlsie Quest, MD     09/09/2020  5:54 PM Patient Name: Darin Hopkins MRN: [id number] Epilepsy Attending: Charlsie Quest Referring Physician/Provider: Dr Brooke Dare Date: 09/09/2020 Duration: 21.26 mins Patient history: 60yo M with multiple seizures. EEG to evaluate for seizures Level of alertness:  Comatose/sedated AEDs during EEG study: Versed, propofol, LEV Technical aspects: This EEG study  was done with scalp electrodes positioned according to the 10-20 International system of electrode placement. Electrical activity was acquired at a sampling rate of  and reviewed with a high frequency filter of  and a low frequency filter of . EEG data were recorded continuously and digitally stored. Description: EEG showed continuous generalized 3 to 6 Hz theta-delta slowing admixed with 15-18Hz  generalized, maximal frontocentral region beta activity. Hyperventilation and photic stimulation were not performed.   ABNORMALITY - Continuous slow, generalized IMPRESSION: This study is suggestive of  moderate diffuse encephalopathy, nonspecific etiology but likely related to sedation. No seizures or epileptiform discharges were seen throughout the recording. Priyanka Annabelle Harman   Overnight EEG with video  Result Date: 09/10/2020 Charlsie Quest, MD     09/10/2020  2:41 PM Patient Name: Darin Hopkins MRN: [id number] Epilepsy Attending: Charlsie Quest Referring Physician/Provider: Dr Milon Dikes Duration: 09/09/2020 2026 to 09/10/2020 1235  Patient history: 59yo M with multiple seizures. EEG to evaluate for seizures  Level of alertness:  Comatose/sedated  AEDs during EEG study: Versed, propofol, LEV  Technical aspects: This EEG study was done with scalp electrodes positioned according to the 10-20 International system of electrode placement. Electrical activity was acquired at a sampling rate of  and reviewed with a high frequency filter of  and a low frequency filter of . EEG data were recorded continuously and digitally stored.  Description: EEG showed continuous generalized and maximal right frontotemporal region 3 to 6 Hz theta-delta slowing admixed with 15-18Hz  generalized, maximal frontocentral region beta activity. Hyperventilation and photic stimulation were not performed. Patient event button was pressed on 09/10/2020 at 0013.  Per RN, patient had right shoulder rhythmic movements which were difficult to visualize on camera.  Concomitant EEG before, during and after the event did not show any EEG change to suggest seizure activity Patient event button was pressed on 09/10/2020 at 0608 for chewing movements. Concomitant EEG before, during and after the event did not show any EEG change to suggest seizure activity  ABNORMALITY - Continuous slow, generalized and maximal right frontotemporal region  IMPRESSION: This study is suggestive of cortical dysfunction in right frontotemporal region likely secondary to underlying infarct. Additionally there is evidence of moderate diffuse encephalopathy,  nonspecific etiology but likely related to sedation. No seizures or epileptiform discharges were seen throughout the recording. Two events were reported as described above without concomitant EEG change and were most likely NOT epileptic.  Priyanka Annabelle Harman       Subjective: No concerns today.  Discharge Exam: Vitals:   09/17/20 0842 09/17/20 1025  BP:  (!) 158/97  Pulse:  79  Resp:    Temp:    SpO2: 91%    Vitals:   09/17/20 0751 09/17/20 0839 09/17/20 0842 09/17/20 1025  BP: (!) 146/84   (!) 158/97  Pulse: 64   79  Resp: 17     Temp: 98.1 F (36.7 C)     TempSrc: Oral     SpO2: 100% 91% 91%   Weight:        General: Pt is alert, awake, not in acute distress Cardiovascular: RRR, S1/S2 +, no rubs, no gallops Respiratory: CTA bilaterally, no wheezing, no rhonchi Abdominal: Soft, NT, ND, bowel sounds + Extremities: no edema, no cyanosis    The results of significant diagnostics from this hospitalization (including imaging, microbiology, ancillary and laboratory) are listed below for reference.     Microbiology: Recent Results (from the past 240 hour(s))  Resp Panel  by RT-PCR (Flu A&B, Covid) Nasopharyngeal Swab     Status: None   Collection Time: 09/09/20  1:46 PM   Specimen: Nasopharyngeal Swab; Nasopharyngeal(NP) swabs in vial transport medium  Result Value Ref Range Status   SARS Coronavirus 2 by RT PCR NEGATIVE NEGATIVE Final    Comment: (NOTE) SARS-CoV-2 target nucleic acids are NOT DETECTED.  The SARS-CoV-2 RNA is generally detectable in upper respiratory specimens during the acute phase of infection. The lowest concentration of SARS-CoV-2 viral copies this assay can detect is 138 copies/mL. A negative result does not preclude SARS-Cov-2 infection and should not be used as the sole basis for treatment or other patient management decisions. A negative result may occur with  improper specimen collection/handling, submission of specimen other than  nasopharyngeal swab, presence of viral mutation(s) within the areas targeted by this assay, and inadequate number of viral copies(<138 copies/mL). A negative result must be combined with clinical observations, patient history, and epidemiological information. The expected result is Negative.  Fact Sheet for Patients:  BloggerCourse.com  Fact Sheet for Healthcare Providers:  SeriousBroker.it  This test is no t yet approved or cleared by the [place] FDA and  has been authorized for detection and/or diagnosis of SARS-CoV-2 by FDA under an Emergency Use Authorization (EUA). This EUA will remain  in effect (meaning this test can be used) for the duration of the COVID-19 declaration under Section 564(b)(1) of the Act, 21 U.S.C.section 360bbb-3(b)(1), unless the authorization is terminated  or revoked sooner.       Influenza A by PCR NEGATIVE NEGATIVE Final   Influenza B by PCR NEGATIVE NEGATIVE Final    Comment: (NOTE) The Xpert Xpress SARS-CoV-2/FLU/RSV plus assay is intended as an aid in the diagnosis of influenza from Nasopharyngeal swab specimens and should not be used as a sole basis for treatment. Nasal washings and aspirates are unacceptable for Xpert Xpress SARS-CoV-2/FLU/RSV testing.  Fact Sheet for Patients: BloggerCourse.com  Fact Sheet for Healthcare Providers: SeriousBroker.it  This test is not yet approved or cleared by the [place] FDA and has been authorized for detection and/or diagnosis of SARS-CoV-2 by FDA under an Emergency Use Authorization (EUA). This EUA will remain in effect (meaning this test can be used) for the duration of the COVID-19 declaration under Section 564(b)(1) of the Act, 21 U.S.C. section 360bbb-3(b)(1), unless the authorization is terminated or revoked.  Performed at [facility] [address] [place], [place]  [address]   Blood culture (routine x 2)     Status: None   Collection Time: 09/09/20  1:46 PM   Specimen: BLOOD  Result Value Ref Range Status   Specimen Description BLOOD BLOOD LEFT FOREARM  Final   Special Requests   Final    BOTTLES DRAWN AEROBIC AND ANAEROBIC Blood Culture adequate volume   Culture   Final    NO GROWTH 5 DAYS Performed at [facility], [address] [place], [place] [address]    Report Status 09/14/2020 FINAL  Final  Blood culture (routine x 2)     Status: Abnormal   Collection Time: 09/09/20  1:46 PM   Specimen: BLOOD  Result Value Ref Range Status   Specimen Description   Final    BLOOD LEFT ANTECUBITAL Performed at [facility], [address] [place], [place] [address]    Special Requests   Final    BOTTLES DRAWN AEROBIC AND ANAEROBIC Blood Culture adequate volume Performed at [facility], [address]  [place], [place] [address]    Culture  Setup Time   Final    Organism ID to follow GRAM POSITIVE COCCI AEROBIC BOTTLE ONLY CRITICAL RESULT CALLED TO, READ BACK BY AND VERIFIED WITH: Viviano Simas RN @1101  09/10/20 SCS Performed at [facility], [address] [place], [place]    Culture (A)  Final    STAPHYLOCOCCUS EPIDERMIDIS THE SIGNIFICANCE OF ISOLATING THIS ORGANISM FROM A SINGLE SET OF BLOOD CULTURES WHEN MULTIPLE SETS ARE DRAWN IS UNCERTAIN. PLEASE NOTIFY THE MICROBIOLOGY DEPARTMENT WITHIN ONE WEEK IF SPECIATION AND SENSITIVITIES ARE REQUIRED. Performed at [facility], [address], [place], [place]    Report Status 09/12/2020 FINAL  Final  Blood Culture ID Panel (Reflexed)     Status: Abnormal   Collection Time: 09/09/20  1:46 PM  Result Value Ref Range Status   Enterococcus faecalis NOT DETECTED NOT DETECTED Final   Enterococcus Faecium NOT DETECTED NOT DETECTED Final   Listeria monocytogenes NOT DETECTED NOT DETECTED Final   Staphylococcus species DETECTED (A) NOT DETECTED  Final    Comment: CRITICAL RESULT CALLED TO, READ BACK BY AND VERIFIED WITH: 11/09/20 RN @1101  09/10/20 SCS    Staphylococcus aureus (BCID) NOT DETECTED NOT DETECTED Final   Staphylococcus epidermidis DETECTED (A) NOT DETECTED Final    Comment: CRITICAL RESULT CALLED TO, READ BACK BY AND VERIFIED WITH: RN @1101  09/10/20 SCS    Staphylococcus lugdunensis NOT DETECTED NOT DETECTED Final   Streptococcus species NOT DETECTED NOT DETECTED Final   Streptococcus agalactiae NOT DETECTED NOT DETECTED Final   Streptococcus pneumoniae NOT DETECTED NOT DETECTED Final   Streptococcus pyogenes NOT DETECTED NOT DETECTED Final   A.calcoaceticus-baumannii NOT DETECTED NOT DETECTED Final   Bacteroides fragilis NOT DETECTED NOT DETECTED Final   Enterobacterales NOT DETECTED NOT DETECTED Final   Enterobacter cloacae complex NOT DETECTED NOT DETECTED Final   Escherichia coli NOT DETECTED NOT DETECTED Final   Klebsiella aerogenes NOT DETECTED NOT DETECTED Final   Klebsiella oxytoca NOT DETECTED NOT DETECTED Final   Klebsiella pneumoniae NOT DETECTED NOT DETECTED Final   Proteus species NOT DETECTED NOT DETECTED Final   Salmonella species NOT DETECTED NOT DETECTED Final   Serratia marcescens NOT DETECTED NOT DETECTED Final   Haemophilus influenzae NOT DETECTED NOT DETECTED Final   Neisseria meningitidis NOT DETECTED NOT DETECTED Final   Pseudomonas aeruginosa NOT DETECTED NOT DETECTED Final   Stenotrophomonas maltophilia NOT DETECTED NOT DETECTED Final   Candida albicans NOT DETECTED NOT DETECTED Final   Candida auris NOT DETECTED NOT DETECTED Final   Candida glabrata NOT DETECTED NOT DETECTED Final   Candida krusei NOT DETECTED NOT DETECTED Final   Candida parapsilosis NOT DETECTED NOT DETECTED Final   Candida tropicalis NOT DETECTED NOT DETECTED Final   Cryptococcus neoformans/gattii NOT DETECTED NOT DETECTED Final   Methicillin resistance mecA/C NOT DETECTED NOT DETECTED Final    Comment:  Performed at [facility], [address] [place], [facility] [address]     Labs: BNP (last 3 results) Recent Labs    08/17/20 1037 08/27/20 1304  BNP 1,306.1* 285.0*   Basic Metabolic Panel: Recent Labs  Lab 09/12/20 0150 09/14/20 0211  NA 145 143  K 4.3 4.1  CL 109 107  CO2 32 31  GLUCOSE 90 75  BUN 23* 24*  CREATININE 1.10 1.35*  CALCIUM 8.9 8.8*  MG 1.8  --    Liver Function Tests: No results for input(s): AST, ALT, ALKPHOS,  BILITOT, PROT, ALBUMIN in the last 168 hours. No results for input(s): LIPASE, AMYLASE in the last 168 hours. No results for input(s): AMMONIA in the last 168 hours. CBC: Recent Labs  Lab 09/12/20 0150 09/14/20 0211  WBC 12.2* 8.2  HGB 11.3* 10.8*  HCT 37.3* 36.2*  MCV 102.2* 103.1*  PLT 159 145*   Cardiac Enzymes: No results for input(s): CKTOTAL, CKMB, CKMBINDEX, TROPONINI in the last 168 hours. BNP: Invalid input(s): POCBNP CBG: No results for input(s): GLUCAP in the last 168 hours. D-Dimer No results for input(s): DDIMER in the last 72 hours. Hgb A1c No results for input(s): HGBA1C in the last 72 hours. Lipid Profile No results for input(s): CHOL, HDL, LDLCALC, TRIG, CHOLHDL, LDLDIRECT in the last 72 hours. Thyroid function studies No results for input(s): TSH, T4TOTAL, T3FREE, THYROIDAB in the last 72 hours.  Invalid input(s): FREET3 Anemia work up No results for input(s): VITAMINB12, FOLATE, FERRITIN, TIBC, IRON, RETICCTPCT in the last 72 hours. Urinalysis    Component Value Date/Time   COLORURINE YELLOW (A) 09/09/2020 1346   APPEARANCEUR CLEAR (A) 09/09/2020 1346   APPEARANCEUR Clear 11/02/2013 0205   LABSPEC 1.017 09/09/2020 1346   LABSPEC 1.005 11/02/2013 0205   PHURINE 5.0 09/09/2020 1346   GLUCOSEU NEGATIVE 09/09/2020 1346   GLUCOSEU Negative 11/02/2013 0205   HGBUR MODERATE (A) 09/09/2020 1346   BILIRUBINUR NEGATIVE 09/09/2020 1346   BILIRUBINUR Negative 11/02/2013 0205   KETONESUR NEGATIVE  09/09/2020 1346   PROTEINUR 100 (A) 09/09/2020 1346   NITRITE NEGATIVE 09/09/2020 1346   LEUKOCYTESUR NEGATIVE 09/09/2020 1346   LEUKOCYTESUR Negative 11/02/2013 0205   Sepsis Labs Invalid input(s): PROCALCITONIN,  WBC,  LACTICIDVEN Microbiology Recent Results (from the past 240 hour(s))  Resp Panel by RT-PCR (Flu A&B, Covid) Nasopharyngeal Swab     Status: None   Collection Time: 09/09/20  1:46 PM   Specimen: Nasopharyngeal Swab; Nasopharyngeal(NP) swabs in vial transport medium  Result Value Ref Range Status   SARS Coronavirus 2 by RT PCR NEGATIVE NEGATIVE Final    Comment: (NOTE) SARS-CoV-2 target nucleic acids are NOT DETECTED.  The SARS-CoV-2 RNA is generally detectable in upper respiratory specimens during the acute phase of infection. The lowest concentration of SARS-CoV-2 viral copies this assay can detect is 138 copies/mL. A negative result does not preclude SARS-Cov-2 infection and should not be used as the sole basis for treatment or other patient management decisions. A negative result may occur with  improper specimen collection/handling, submission of specimen other than nasopharyngeal swab, presence of viral mutation(s) within the areas targeted by this assay, and inadequate number of viral copies(<138 copies/mL). A negative result must be combined with clinical observations, patient history, and epidemiological information. The expected result is Negative.  Fact Sheet for Patients:  BloggerCourse.com  Fact Sheet for Healthcare Providers:  SeriousBroker.it  This test is no t yet approved or cleared by the [place] FDA and  has been authorized for detection and/or diagnosis of SARS-CoV-2 by FDA under an Emergency Use Authorization (EUA). This EUA will remain  in effect (meaning this test can be used) for the duration of the COVID-19 declaration under Section 564(b)(1) of the Act, 21 U.S.C.section  360bbb-3(b)(1), unless the authorization is terminated  or revoked sooner.       Influenza A by PCR NEGATIVE NEGATIVE Final   Influenza B by PCR NEGATIVE NEGATIVE Final    Comment: (NOTE) The Xpert Xpress SARS-CoV-2/FLU/RSV plus assay is intended as an aid in the diagnosis of influenza from  Nasopharyngeal swab specimens and should not be used as a sole basis for treatment. Nasal washings and aspirates are unacceptable for Xpert Xpress SARS-CoV-2/FLU/RSV testing.  Fact Sheet for Patients: BloggerCourse.comhttps://www.fda.gov/media/152166/download  Fact Sheet for Healthcare Providers: SeriousBroker.ithttps://www.fda.gov/media/152162/download  This test is not yet approved or cleared by the [place] FDA and has been authorized for detection and/or diagnosis of SARS-CoV-2 by FDA under an Emergency Use Authorization (EUA). This EUA will remain in effect (meaning this test can be used) for the duration of the COVID-19 declaration under Section 564(b)(1) of the Act, 21 U.S.C. section 360bbb-3(b)(1), unless the authorization is terminated or revoked.  Performed at [facility], [address] [place], [place] [address]   Blood culture (routine x 2)     Status: None   Collection Time: 09/09/20  1:46 PM   Specimen: BLOOD  Result Value Ref Range Status   Specimen Description BLOOD BLOOD LEFT FOREARM  Final   Special Requests   Final    BOTTLES DRAWN AEROBIC AND ANAEROBIC Blood Culture adequate volume   Culture   Final    NO GROWTH 5 DAYS Performed at [facility], [address] [place], [place] [address]    Report Status 09/14/2020 FINAL  Final  Blood culture (routine x 2)     Status: Abnormal   Collection Time: 09/09/20  1:46 PM   Specimen: BLOOD  Result Value Ref Range Status   Specimen Description   Final    BLOOD LEFT ANTECUBITAL Performed at [facility], [address] [place], [place] [address]    Special Requests   Final    BOTTLES DRAWN AEROBIC AND ANAEROBIC  Blood Culture adequate volume Performed at [facility], [address] [place], [place] [address]    Culture  Setup Time   Final    Organism ID to follow GRAM POSITIVE COCCI AEROBIC BOTTLE ONLY CRITICAL RESULT CALLED TO, READ BACK BY AND VERIFIED WITH: Viviano SimasLIZ GANNON RN @1101  09/10/20 SCS Performed at [facility], [address] [place], [place] [address]    Culture (A)  Final    STAPHYLOCOCCUS EPIDERMIDIS THE SIGNIFICANCE OF ISOLATING THIS ORGANISM FROM A SINGLE SET OF BLOOD CULTURES WHEN MULTIPLE SETS ARE DRAWN IS UNCERTAIN. PLEASE NOTIFY THE MICROBIOLOGY DEPARTMENT WITHIN ONE WEEK IF SPECIATION AND SENSITIVITIES ARE REQUIRED. Performed at [facility], [address] [place], [place] [address]    Report Status 09/12/2020 FINAL  Final  Blood Culture ID Panel (Reflexed)     Status: Abnormal   Collection Time: 09/09/20  1:46 PM  Result Value Ref Range Status   Enterococcus faecalis NOT DETECTED NOT DETECTED Final   Enterococcus Faecium NOT DETECTED NOT DETECTED Final   Listeria monocytogenes NOT DETECTED NOT DETECTED Final   Staphylococcus species DETECTED (A) NOT DETECTED Final    Comment: CRITICAL RESULT CALLED TO, READ BACK BY AND VERIFIED WITH: Viviano SimasLIZ GANNON RN @1101  09/10/20 SCS    Staphylococcus aureus (BCID) NOT DETECTED NOT DETECTED Final   Staphylococcus epidermidis DETECTED (A) NOT DETECTED Final    Comment: CRITICAL RESULT CALLED TO, READ BACK BY AND VERIFIED WITH: Viviano SimasLIZ GANNON RN @1101  09/10/20 SCS    Staphylococcus lugdunensis NOT DETECTED NOT DETECTED Final   Streptococcus species NOT DETECTED NOT DETECTED Final   Streptococcus agalactiae NOT DETECTED NOT DETECTED Final   Streptococcus pneumoniae NOT DETECTED NOT DETECTED Final   Streptococcus pyogenes NOT DETECTED NOT DETECTED Final   A.calcoaceticus-baumannii NOT DETECTED NOT DETECTED Final   Bacteroides fragilis  NOT DETECTED NOT DETECTED Final   Enterobacterales NOT DETECTED NOT DETECTED Final    Enterobacter cloacae complex NOT DETECTED NOT DETECTED Final   Escherichia coli NOT DETECTED NOT DETECTED Final   Klebsiella aerogenes NOT DETECTED NOT DETECTED Final   Klebsiella oxytoca NOT DETECTED NOT DETECTED Final   Klebsiella pneumoniae NOT DETECTED NOT DETECTED Final   Proteus species NOT DETECTED NOT DETECTED Final   Salmonella species NOT DETECTED NOT DETECTED Final   Serratia marcescens NOT DETECTED NOT DETECTED Final   Haemophilus influenzae NOT DETECTED NOT DETECTED Final   Neisseria meningitidis NOT DETECTED NOT DETECTED Final   Pseudomonas aeruginosa NOT DETECTED NOT DETECTED Final   Stenotrophomonas maltophilia NOT DETECTED NOT DETECTED Final   Candida albicans NOT DETECTED NOT DETECTED Final   Candida auris NOT DETECTED NOT DETECTED Final   Candida glabrata NOT DETECTED NOT DETECTED Final   Candida krusei NOT DETECTED NOT DETECTED Final   Candida parapsilosis NOT DETECTED NOT DETECTED Final   Candida tropicalis NOT DETECTED NOT DETECTED Final   Cryptococcus neoformans/gattii NOT DETECTED NOT DETECTED Final   Methicillin resistance mecA/C NOT DETECTED NOT DETECTED Final    Comment: Performed at [facility], [address], [place], [place] [address]     Time coordinating discharge: 35 minutes  SIGNED:   Jacquelin Hawking, MD [facility] 09/17/2020, 1:48 PM

## 2020-10-02 ENCOUNTER — Emergency Department
Admission: EM | Admit: 2020-10-02 | Discharge: 2020-10-02 | Disposition: A | Discharge status: Home / Self Care | Payer: Medicaid Other | Attending: Emergency Medicine | Admitting: Emergency Medicine

## 2020-10-02 ENCOUNTER — Emergency Department: Payer: Medicaid Other

## 2020-10-02 ENCOUNTER — Other Ambulatory Visit: Payer: Self-pay

## 2020-10-02 DIAGNOSIS — J449 Chronic obstructive pulmonary disease, unspecified: Secondary | ICD-10-CM | POA: Diagnosis not present

## 2020-10-02 DIAGNOSIS — I5023 Acute on chronic systolic (congestive) heart failure: Secondary | ICD-10-CM | POA: Diagnosis not present

## 2020-10-02 DIAGNOSIS — I110 Hypertensive heart disease with heart failure: Secondary | ICD-10-CM | POA: Diagnosis not present

## 2020-10-02 DIAGNOSIS — I509 Heart failure, unspecified: Secondary | ICD-10-CM

## 2020-10-02 DIAGNOSIS — Z79899 Other long term (current) drug therapy: Secondary | ICD-10-CM | POA: Diagnosis not present

## 2020-10-02 DIAGNOSIS — R600 Localized edema: Secondary | ICD-10-CM

## 2020-10-02 DIAGNOSIS — F17210 Nicotine dependence, cigarettes, uncomplicated: Secondary | ICD-10-CM | POA: Insufficient documentation

## 2020-10-02 DIAGNOSIS — R2243 Localized swelling, mass and lump, lower limb, bilateral: Secondary | ICD-10-CM | POA: Diagnosis present

## 2020-10-02 DIAGNOSIS — Z7982 Long term (current) use of aspirin: Secondary | ICD-10-CM | POA: Diagnosis not present

## 2020-10-02 LAB — CBC
HCT: 36.9 % — ABNORMAL LOW (ref 39.0–52.0)
Hemoglobin: 11.2 g/dL — ABNORMAL LOW (ref 13.0–17.0)
MCH: 30.5 pg (ref 26.0–34.0)
MCHC: 30.4 g/dL (ref 30.0–36.0)
MCV: 100.5 fL — ABNORMAL HIGH (ref 80.0–100.0)
Platelets: 185 10*3/uL (ref 150–400)
RBC: 3.67 MIL/uL — ABNORMAL LOW (ref 4.22–5.81)
RDW: 12.7 % (ref 11.5–15.5)
WBC: 9 10*3/uL (ref 4.0–10.5)
nRBC: 0 % (ref 0.0–0.2)

## 2020-10-02 LAB — BASIC METABOLIC PANEL
Anion gap: 10 (ref 5–15)
BUN: 17 mg/dL (ref 6–20)
CO2: 34 mmol/L — ABNORMAL HIGH (ref 22–32)
Calcium: 9 mg/dL (ref 8.9–10.3)
Chloride: 98 mmol/L (ref 98–111)
Creatinine, Ser: 1.03 mg/dL (ref 0.61–1.24)
GFR, Estimated: >60 mL/min (ref >60)
Glucose, Bld: 83 mg/dL (ref 70–99)
Potassium: 4.3 mmol/L (ref 3.5–5.1)
Sodium: 142 mmol/L (ref 135–145)

## 2020-10-02 LAB — BRAIN NATRIURETIC PEPTIDE: B Natriuretic Peptide: 470.7 pg/mL — ABNORMAL HIGH (ref 0.0–100.0)

## 2020-10-02 MED ORDER — LEVETIRACETAM 500 MG PO TABS
500.0000 mg | ORAL_TABLET | Freq: Once | ORAL | Status: AC
  Administered 2020-10-02: 500 mg via ORAL
  Filled 2020-10-02: qty 1

## 2020-10-02 MED ORDER — FUROSEMIDE 10 MG/ML IJ SOLN
40.0000 mg | Freq: Once | INTRAMUSCULAR | Status: AC
  Administered 2020-10-02: 40 mg via INTRAVENOUS
  Filled 2020-10-02: qty 4

## 2020-10-02 MED ORDER — IPRATROPIUM-ALBUTEROL 0.5-2.5 (3) MG/3ML IN SOLN
3.0000 mL | Freq: Once | RESPIRATORY_TRACT | Status: AC
  Administered 2020-10-02: 3 mL via RESPIRATORY_TRACT
  Filled 2020-10-02: qty 3

## 2020-10-02 NOTE — ED Triage Notes (Signed)
Pt comes into the ED via [facility] from the [place] clinic c/o peripheral edema.  Pt does have known CHF.  Pt had low O2 at the clinic and they sent him this way.  Pt denies any SHOB.  Pt chronically wears 4L nasal cannula and he has now returned to baseline in the upper 90's O2 sat.  CBG 81, 98.0 temp, A&Ox4.  20g L AC.

## 2020-10-02 NOTE — ED Notes (Signed)
US at bedside

## 2020-10-02 NOTE — ED Notes (Signed)
Provided with snack at this time, per OK from MD. Patients sister called to check on patient, updated with patients permission.

## 2020-10-02 NOTE — ED Provider Notes (Signed)
[facility] Emergency Department Provider Note   ____________________________________________   Event Date/Time   First MD Initiated Contact with Patient 10/02/20 1143     (approximate)  I have reviewed the triage vital signs and the nursing notes.   HISTORY  Chief Complaint Leg Swelling    HPI Darin Hopkins is a 59 y.o. male history of seizure disorder COPD, heart failure preserved ejection  Patient presents today, seen in his primary care doctor's clinic for evaluation and noted to have significant lower extremity edema.  Also noted he was apparently had low oxygen saturation.  Patient does use 4 L of home oxygen    Patient reports he feels peripherally fine he went over to the clinic to just have a follow-up visit.  He did however report they noticed his lower legs were quite swollen at the clinic visit.  He is not short of breath is not coughing he is not have any fevers or chills.  No chest pain no trouble breathing.  He does have to see sleep in an elevated position this is not new  No further seizures and he reports compliance with all of his medications which his sister helps provide to him on a daily basis  History of distant alcohol use, he no longer drinks alcohol now for what he reports is a month   Past Medical History:  Diagnosis Date  . COPD (chronic obstructive pulmonary disease) (HCC)   . Hypertension   . Seizures [facility])     Patient Active Problem List   Diagnosis Date Noted  . Status epilepticus (HCC) 09/09/2020  . COPD exacerbation (HCC) 08/27/2020  . Protein-calorie malnutrition, severe 08/21/2020  . Acute hypercapnic respiratory failure (HCC)   . Acute decompensated heart failure (HCC) 08/18/2020  . Acute exacerbation of CHF (congestive heart failure) (HCC) 08/17/2020  . Essential hypertension 08/17/2020  . History of seizure 08/17/2020  . COPD (chronic obstructive pulmonary disease) (HCC) 08/17/2020  . Hyperlipidemia  08/17/2020  . GERD (gastroesophageal reflux disease) 08/17/2020  . Alcohol abuse 08/17/2020    History reviewed. No pertinent surgical history.  Prior to Admission medications   Medication Sig Start Date End Date Taking? Authorizing Provider  amLODipine (NORVASC) 10 MG tablet Take 10 mg by mouth every evening. 07/28/20   [provider]  aspirin 81 MG EC tablet Take 81 mg by mouth in the morning.    [provider]  atorvastatin (LIPITOR) 20 MG tablet Take 20 mg by mouth every evening. 07/28/20   [provider]  carvedilol (COREG) 25 MG tablet Take 1 tablet (25 mg total) by mouth 2 (two) times daily with a meal. 09/12/20   Pokhrel, Laxman, MD  Cholecalciferol (VITAMIN D-3) 25 MCG (1000 UT) CAPS Take 1,000 Units by mouth in the morning.    [provider]  doxazosin (CARDURA) 2 MG tablet Take 2 mg by mouth every evening. 07/28/20   [provider]  folic acid (FOLVITE) 1 MG tablet Take 1 mg by mouth daily. 04/17/20   [provider]  furosemide (LASIX) 20 MG tablet Take 3 tablets (60 mg total) by mouth daily. Patient not taking: No sig reported 08/26/20   Marrion Coy, MD  levETIRAcetam (KEPPRA) 750 MG tablet Take 1 tablet (750 mg total) by mouth 2 (two) times daily. 09/12/20 11/11/20  Pokhrel, Rebekah Chesterfield, MD  lisinopril (ZESTRIL) 5 MG tablet Take 5 mg by mouth daily. 07/28/20   [provider]  Multiple Vitamin (MULTIVITAMIN ADULT) TABS Take  2 tablets by mouth daily.    [provider]  omeprazole (PRILOSEC) 20 MG capsule Take 20 mg by mouth daily before breakfast. 07/28/20   [provider]  PROAIR HFA 108 (90 Base) MCG/ACT inhaler Inhale 2 puffs into the lungs every 6 (six) hours as needed for shortness of breath or wheezing. 07/28/20   [provider]  SPIRIVA HANDIHALER 18 MCG inhalation capsule Place 1 capsule into inhaler and inhale daily. 07/28/20   [provider]  thiamine 100 MG tablet Take 1 tablet  (100 mg total) by mouth daily. Patient not taking: No sig reported 08/26/20   Marrion Coy, MD    Allergies Patient has no known allergies.  History reviewed. No pertinent family history.  Social History Social History   Tobacco Use  . Smoking status: Current Every Day Smoker    Packs/day: 2.00    Types: Cigarettes  . Smokeless tobacco: Never Used  Vaping Use  . Vaping Use: Never used  Substance Use Topics  . Alcohol use: Yes    Alcohol/week: 3.0 standard drinks    Types: 3 Cans of beer per week    Comment: daily  . Drug use: Not Currently    Review of Systems Constitutional: No fever/chills Eyes: No visual changes. ENT: No sore throat. Cardiovascular: Denies chest pain. Respiratory: Denies shortness of breath.  No wheezing. Gastrointestinal: No abdominal pain.   Genitourinary: Negative for dysuria. Musculoskeletal: Negative for back pain.  Has noticed his lower legs are both swollen unclear exactly when this started but reports they noticed that his clinic appointment Skin: Negative for rash. Neurological: Negative for headaches, areas of focal weakness or numbness.    ____________________________________________   PHYSICAL EXAM:  VITAL SIGNS: ED Triage Vitals  Enc Vitals Group     BP 10/02/20 1037 (!) 192/93     Pulse Rate 10/02/20 1037 63     Resp 10/02/20 1037 18     Temp 10/02/20 1037 97.6 F (36.4 C)     Temp Source 10/02/20 1037 Oral     SpO2 10/02/20 1037 98 %     Weight 10/02/20 1042 140 lb (63.5 kg)     Height 10/02/20 1042 5\' 8"  (1.727 m)     Head Circumference --      Peak Flow --      Pain Score 10/02/20 1042 0     Pain Loc --      Pain Edu? --      Excl. in GC? --     Vitals:   10/02/20 1037 10/02/20 1219  BP: (!) 192/93 (!) 170/91  Pulse: 63 65  Resp: 18 18  Temp: 97.6 F (36.4 C)   SpO2: 98% 96%     Constitutional: Alert and oriented. Well appearing and in no acute distress.  Somewhat chronically ill in appearance but in no  distress.  On 4 L nasal cannula with normal oxygenation normal oxygen saturation Eyes: Conjunctivae are normal. Head: Atraumatic. Nose: No congestion/rhinnorhea. Mouth/Throat: Mucous membranes are moist. Neck: No stridor.  Cardiovascular: Normal rate, regular rhythm. Grossly normal heart sounds.  Good peripheral circulation. Respiratory: Normal respiratory effort.  No retractions. Lungs CTAB. Gastrointestinal: Soft and nontender. No distention. Musculoskeletal: No lower extremity tenderness but he does have 3+ bilateral lower extremity pitting edema without erythema or tenderness.  Some venous stasis lesions are present. Neurologic:  Normal speech and language. No gross focal neurologic deficits are appreciated.  Skin:  Skin is warm, dry and intact. No rash  noted. Psychiatric: Mood and affect are normal. Speech and behavior are normal.  ____________________________________________   LABS (all labs ordered are listed, but only abnormal results are displayed)  Labs Reviewed  BASIC METABOLIC PANEL - Abnormal; Notable for the following components:      Result Value   CO2 34 (*)    All other components within normal limits  CBC - Abnormal; Notable for the following components:   RBC 3.67 (*)    Hemoglobin 11.2 (*)    HCT 36.9 (*)    MCV 100.5 (*)    All other components within normal limits  BRAIN NATRIURETIC PEPTIDE - Abnormal; Notable for the following components:   B Natriuretic Peptide 470.7 (*)    All other components within normal limits   ____________________________________________  EKG  Reviewed interpreted at 1045 Heart rate 60 QRS 80 QTc 430 Normal sinus rhythm, left ventricular hypertrophy.  No evidence of acute ischemia ____________________________________________  RADIOLOGY  DG Chest 2 View  Result Date: 10/02/2020 CLINICAL DATA:  COPD. EXAM: CHEST - 2 VIEW COMPARISON:  Sep 10, 2020. FINDINGS: Similar cardiomediastinal silhouette. Tortuous aorta. Both lungs  are clear. Chronic hyperinflation. No visible pleural effusions or pneumothorax. Left chest skin fold. No acute osseous abnormality. IMPRESSION: 1. No active cardiopulmonary disease. 2. Chronic hyperinflation. Electronically Signed   By: Feliberto HartsFrederick S Jones MD   On: 10/02/2020 11:34   US Venous Img Lower Bilateral  Result Date: 10/02/2020 CLINICAL DATA:  Bilateral lower extremity edema EXAM: BILATERAL LOWER EXTREMITY VENOUS DOPPLER ULTRASOUND TECHNIQUE: Gray-scale sonography with graded compression, as well as color Doppler and duplex ultrasound were performed to evaluate the lower extremity deep venous systems from the level of the common femoral vein and including the common femoral, femoral, profunda femoral, popliteal and calf veins including the posterior tibial, peroneal and gastrocnemius veins when visible. The superficial great saphenous vein was also interrogated. Spectral Doppler was utilized to evaluate flow at rest and with distal augmentation maneuvers in the common femoral, femoral and popliteal veins. COMPARISON:  None. FINDINGS: RIGHT LOWER EXTREMITY Common Femoral Vein: No evidence of thrombus. Normal compressibility, respiratory phasicity and response to augmentation. Saphenofemoral Junction: No evidence of thrombus. Normal compressibility and flow on color Doppler imaging. Profunda Femoral Vein: No evidence of thrombus. Normal compressibility and flow on color Doppler imaging. Femoral Vein: No evidence of thrombus. Normal compressibility, respiratory phasicity and response to augmentation. Popliteal Vein: No evidence of thrombus. Normal compressibility, respiratory phasicity and response to augmentation. Calf Veins: No evidence of thrombus. Normal compressibility and flow on color Doppler imaging. Superficial Great Saphenous Vein: No evidence of thrombus. Normal compressibility. Venous Reflux:  None. Other Findings:  None. LEFT LOWER EXTREMITY Common Femoral Vein: No evidence of thrombus. Normal  compressibility, respiratory phasicity and response to augmentation. Saphenofemoral Junction: No evidence of thrombus. Normal compressibility and flow on color Doppler imaging. Profunda Femoral Vein: No evidence of thrombus. Normal compressibility and flow on color Doppler imaging. Femoral Vein: No evidence of thrombus. Normal compressibility, respiratory phasicity and response to augmentation. Popliteal Vein: No evidence of thrombus. Normal compressibility, respiratory phasicity and response to augmentation. Calf Veins: No evidence of thrombus. Normal compressibility and flow on color Doppler imaging. Superficial Great Saphenous Vein: No evidence of thrombus. Normal compressibility. Venous Reflux:  None. Other Findings:  None. IMPRESSION: No evidence of deep venous thrombosis in either lower extremity. Electronically Signed   By: Alcide CleverMark  Lukens M.D.   On: 10/02/2020 13:55    Chest x-ray reviewed negative for acute findings.  Bilateral lower extremity ultrasound negative for DVT ____________________________________________   PROCEDURES  Procedure(s) performed: None  Procedures  Critical Care performed: No  ____________________________________________   INITIAL IMPRESSION / ASSESSMENT AND PLAN / ED COURSE  Pertinent labs & imaging results that were available during my care of the patient were reviewed by me and considered in my medical decision making (see chart for details).   Patient presents for evaluation of new lower extremity edema, also reported hypoxia at [facility] clinic.  On arrival here though and on multiple checks he is not found to be hypoxic on his baseline 4 L of oxygen.  Reports that he has oxygen at home and his sister helps manage that and when he travels she brings oxygen tank with  He has not had any acute symptoms such as chest pain shortness of breath fevers chills cough etc.  He seems to be doing well posthospitalization except for now edema.  He has a known history of CHF  heart failure preserved ejection fraction  Findings seem consistent with edema likely secondary to his history of CHF.  I discussed with the patient, we will treat with Lasix here  ----------------------------------------- 3:27 PM on 10/02/2020 -----------------------------------------   And patient diuresed well, having taken Lasix, he reports his breathing remains normal he is awake alert without distress and requesting to be discharged home his sister to pick him up and bring his oxygen  He appears appropriate for discharge.  Return precautions and treatment recommendations and follow-up discussed with the patient who is agreeable with the plan.  Also placed referral for close follow-up after ED visit urgent request to our heart failure clinic      ____________________________________________   FINAL CLINICAL IMPRESSION(S) / ED DIAGNOSES  Final diagnoses:  Bilateral lower extremity edema  Acute on chronic congestive heart failure, unspecified heart failure type [facility])        Note:  This document was prepared using Dragon voice recognition software and may include unintentional dictation errors       Sharyn Creamer, MD 10/02/20 1528

## 2020-10-02 NOTE — ED Triage Notes (Signed)
See triage note, pt alert and oriented, NAD noted.  Edema noted BLE Denies shob, cp Hx COPD

## 2020-10-06 NOTE — Progress Notes (Signed)
Patient ID: Darin Hopkins, male    DOB: [birth year], 60 y.o.   MRN: [id number]  HPI  Darin Hopkins is a 60 y/o male with a history of HTN, COPD, seizures, previous tobacco/ alcohol use and chronic heart failure.   Echo report from 08/18/20 reviewed and showed an EF of 60-65% along with mild LVH and moderate AS.  Was in the ED 10/02/20 due to peripheral edema. Treated with diuretics and he was released. Admitted 09/09/20 due to seizures. Neurology consult obtained. Intubated due to inability to protect his airway. Head CT negative for acute stroke. Keppra increased. Extubated successfully and placed on home oxygen of 3L. Discharged after 8 days.   He presents today for his initial visit with a chief complaint of minimal shortness of breath upon moderate exertion. He describes this as chronic in nature having been present for several years. He has associated fatigue, dry cough, pedal edema and light-headedness along with this. He denies any difficulty sleeping, abdominal distention, palpitations, chest pain, head congestion or decreased appetite.   Currently getting PT. Sister that is present with him is unsure of how to use the oxygen tank.   Past Medical History:  Diagnosis Date  . CHF (congestive heart failure) (HCC)   . COPD (chronic obstructive pulmonary disease) (HCC)   . Hypertension   . Seizures (HCC)    History reviewed. No pertinent surgical history. History reviewed. No pertinent family history. Social History   Tobacco Use  . Smoking status: Current Every Day Smoker    Packs/day: 2.00    Types: Cigarettes  . Smokeless tobacco: Never Used  Substance Use Topics  . Alcohol use: Yes    Alcohol/week: 3.0 standard drinks    Types: 3 Cans of beer per week    Comment: daily   No Known Allergies Prior to Admission medications   Medication Sig Start Date End Date Taking? Authorizing Provider  amLODipine (NORVASC) 10 MG tablet Take 10 mg by mouth every evening. 07/28/20  Yes [provider]  aspirin 81 MG EC tablet Take 81 mg by mouth in the morning.   Yes [provider]  atorvastatin (LIPITOR) 20 MG tablet Take 20 mg by mouth every evening. 07/28/20  Yes [provider]  carvedilol (COREG) 25 MG tablet Take 1 tablet (25 mg total) by mouth 2 (two) times daily with a meal. 09/12/20  Yes Pokhrel, Laxman, MD  Cholecalciferol (VITAMIN D-3) 25 MCG (1000 UT) CAPS Take 1,000 Units by mouth in the morning.   Yes [provider]  doxazosin (CARDURA) 2 MG tablet Take 2 mg by mouth every evening. 07/28/20  Yes [provider]  folic acid (FOLVITE) 1 MG tablet Take 1 mg by mouth daily. 04/17/20  Yes [provider]  furosemide (LASIX) 20 MG tablet Take 3 tablets (60 mg total) by mouth daily. 08/26/20  Yes Marrion Coy, MD  levETIRAcetam (KEPPRA) 750 MG tablet Take 1 tablet (750 mg total) by mouth 2 (two) times daily. 09/12/20 11/11/20 Yes Pokhrel, Laxman, MD  lisinopril (ZESTRIL) 5 MG tablet Take 5 mg by mouth daily. 07/28/20  Yes [provider]  Multiple Vitamin (MULTIVITAMIN ADULT) TABS Take 2 tablets by mouth daily.   Yes [provider]  omeprazole (PRILOSEC) 20 MG capsule Take 20 mg by mouth daily before breakfast. 07/28/20  Yes [provider]  PROAIR HFA 108 (90 Base) MCG/ACT inhaler Inhale 2 puffs into the lungs every 6 (six) hours as needed for shortness of breath  or wheezing. 07/28/20  Yes [provider]  SPIRIVA HANDIHALER 18 MCG inhalation capsule Place 1 capsule into inhaler and inhale daily. 07/28/20  Yes [provider]  thiamine 100 MG tablet Take 1 tablet (100 mg total) by mouth daily. 08/26/20  Yes Marrion Coy, MD   Review of Systems  Constitutional: Positive for fatigue. Negative for appetite change.  HENT: Negative for congestion, postnasal drip and sore throat.   Eyes: Negative.   Respiratory: Positive for cough (dry cough) and shortness of breath. Negative for chest tightness.    Cardiovascular: Positive for leg swelling. Negative for chest pain and palpitations.  Gastrointestinal: Negative for abdominal distention and abdominal pain.  Endocrine: Negative.   Genitourinary: Negative.   Musculoskeletal: Negative for back pain and neck pain.  Skin: Negative.   Allergic/Immunologic: Negative.   Neurological: Positive for light-headedness. Negative for dizziness.  Hematological: Negative for adenopathy. Does not bruise/bleed easily.  Psychiatric/Behavioral: Negative for dysphoric mood and sleep disturbance (sleeping on 2 pillows with oxygen). The patient is not nervous/anxious.    Vitals:   10/07/20 0938  BP: 137/87  Pulse: (!) 54  Resp: 18  SpO2: 100%  Weight: 126 lb 4 oz (57.3 kg)  Height: 5\' 7"  (1.702 m)   Wt Readings from Last 3 Encounters:  10/07/20 126 lb 4 oz (57.3 kg)  10/02/20 140 lb (63.5 kg)  09/10/20 130 lb 11.7 oz (59.3 kg)   Lab Results  Component Value Date   CREATININE 1.03 10/02/2020   CREATININE 1.35 (H) 09/14/2020   CREATININE 1.10 09/12/2020    Physical Exam Vitals and nursing note reviewed. Exam conducted with a chaperone present (sister).  Constitutional:      Appearance: Normal appearance.  HENT:     Head: Normocephalic and atraumatic.  Cardiovascular:     Rate and Rhythm: Regular rhythm. Bradycardia present.  Pulmonary:     Effort: Pulmonary effort is normal.     Breath sounds: No wheezing or rales.  Abdominal:     General: There is no distension.     Palpations: Abdomen is soft.     Tenderness: There is no abdominal tenderness.  Musculoskeletal:        General: No tenderness.     Cervical back: Normal range of motion and neck supple.     Right lower leg: Edema (2+ pitting) present.     Left lower leg: Edema (2+ pitting) present.  Skin:    General: Skin is warm and dry.  Neurological:     Mental Status: He is alert and oriented to person, place, and time. Mental status is at baseline.  Psychiatric:        Mood and  Affect: Mood normal.        Behavior: Behavior normal.    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)- - NYHA class II - euvolemic today - scales given today and he was instructed to weigh every morning after using the bathroom and call for an overnight weight gain of > 2 pounds or a weekly weight gain of > 5 pounds - rarely adding salt and his sister has been reading food labels for sodium content; low sodium cookbook given to him along with written dietary sodium information - consider changing his lisinopril to entresto and adding jardiance - needs to get established with cardiology - BNP 10/02/20 was 470.7  2: HTN- - BP looks good today - follows with [facility] and sister has  to make an appointment with them - BMP 10/02/20 reviewed and showed sodium 142, potassium 4.3, creatinine 1.03 and GFR >60  3: COPD- - wearing oxygen at 4L although when patient first arrived, it was completely off and room air sat was 84% and then quickly rose to 100% - sister says that she's very anxious about his oxygen and doesn't feel like the oxygen company educated her very well; encouraged her to call them back to review everything - says that he hasn't smoked anything for the last 2-3 months  4: Lymphedema- - stage 2 - not elevating his legs and he was encouraged to elevate them when sitting for long periods of time - currently receiving PT - instructed to get compression socks and put them on every morning with removal at bedtime - consider compression boots if edema persists   Medication bottles reviewed.   Return in 2 weeks (sister's preference), sooner if needed

## 2020-10-07 ENCOUNTER — Encounter: Payer: Self-pay | Admitting: Family

## 2020-10-07 ENCOUNTER — Other Ambulatory Visit: Payer: Self-pay

## 2020-10-07 ENCOUNTER — Ambulatory Visit: Payer: Medicaid Other | Attending: Family | Admitting: Family

## 2020-10-07 VITALS — BP 137/87 | HR 54 | Resp 18 | Ht 67.0 in | Wt 126.2 lb

## 2020-10-07 DIAGNOSIS — J431 Panlobular emphysema: Secondary | ICD-10-CM

## 2020-10-07 DIAGNOSIS — I110 Hypertensive heart disease with heart failure: Secondary | ICD-10-CM | POA: Diagnosis not present

## 2020-10-07 DIAGNOSIS — I890 Lymphedema, not elsewhere classified: Secondary | ICD-10-CM | POA: Diagnosis not present

## 2020-10-07 DIAGNOSIS — R42 Dizziness and giddiness: Secondary | ICD-10-CM | POA: Insufficient documentation

## 2020-10-07 DIAGNOSIS — R0602 Shortness of breath: Secondary | ICD-10-CM | POA: Diagnosis present

## 2020-10-07 DIAGNOSIS — F17210 Nicotine dependence, cigarettes, uncomplicated: Secondary | ICD-10-CM | POA: Insufficient documentation

## 2020-10-07 DIAGNOSIS — J449 Chronic obstructive pulmonary disease, unspecified: Secondary | ICD-10-CM | POA: Diagnosis not present

## 2020-10-07 DIAGNOSIS — Z79899 Other long term (current) drug therapy: Secondary | ICD-10-CM | POA: Insufficient documentation

## 2020-10-07 DIAGNOSIS — Z7982 Long term (current) use of aspirin: Secondary | ICD-10-CM | POA: Diagnosis not present

## 2020-10-07 DIAGNOSIS — I5032 Chronic diastolic (congestive) heart failure: Secondary | ICD-10-CM | POA: Diagnosis not present

## 2020-10-07 DIAGNOSIS — Z7901 Long term (current) use of anticoagulants: Secondary | ICD-10-CM | POA: Insufficient documentation

## 2020-10-07 DIAGNOSIS — I10 Essential (primary) hypertension: Secondary | ICD-10-CM

## 2020-10-07 NOTE — Patient Instructions (Addendum)
Begin weighing daily and call for an overnight weight gain of > 2 pounds or a weekly weight gain of >5 pounds.   Call the oxygen company to find out about the oxygen tanks   Prop your legs when sitting for long periods of time   Get compression socks from [employer] or your pharmacy and put them on every morning and take them off every morning.

## 2020-10-21 ENCOUNTER — Ambulatory Visit: Payer: Medicaid Other | Admitting: Family

## 2020-11-13 NOTE — Progress Notes (Signed)
Patient ID: Darin Hopkins, male    DOB: 1960-07-27, 60 y.o.   MRN: [id number]  HPI  [name] is a 60 y/o male with a history of HTN, COPD, seizures, previous tobacco/ alcohol use and chronic heart failure.   Echo report from 08/18/20 reviewed and showed an EF of 60-65% along with mild LVH and moderate AS.  Was in the ED 10/02/20 due to peripheral edema. Treated with diuretics and he was released. Admitted 09/09/20 due to seizures. Neurology consult obtained. Intubated due to inability to protect his airway. Head CT negative for acute stroke. Keppra increased. Extubated successfully and placed on home oxygen of 3L. Discharged after 8 days.   He presents today for a follow-up visit with a chief complaint of minimal shortness of breath upon moderate exertion. He describes this as having been present for several months. He has associated fatigue, loose cough, pedal edema (improving), light-headedness and difficulty sleeping (days/nights mixed up) along with this. He denies any weight gain, abdominal distention, palpitations or chest pain.   Sister says that his PT stopped because they told her he doesn't have insurance when he does. She would also like to inquire about getting the smaller portable oxygen tanks because the current ones are too heavy.   Getting medications bubble packed through his pharmacy  Past Medical History:  Diagnosis Date   CHF (congestive heart failure) (HCC)    COPD (chronic obstructive pulmonary disease) (HCC)    Hypertension    Seizures (HCC)    No past surgical history on file. No family history on file. Social History   Tobacco Use   Smoking status: Every Day    Packs/day: 2.00    Pack years: 0.00    Types: Cigarettes   Smokeless tobacco: Never  Substance Use Topics   Alcohol use: Yes    Alcohol/week: 3.0 standard drinks    Types: 3 Cans of beer per week    Comment: daily   No Known Allergies  Prior to Admission medications   Medication Sig Start Date  End Date Taking? Authorizing Provider  amLODipine (NORVASC) 10 MG tablet Take 10 mg by mouth every evening. 07/28/20  Yes [provider]  aspirin 81 MG EC tablet Take 81 mg by mouth in the morning.   Yes [provider]  atorvastatin (LIPITOR) 20 MG tablet Take 20 mg by mouth every evening. 07/28/20  Yes [provider]  carvedilol (COREG) 25 MG tablet Take 1 tablet (25 mg total) by mouth 2 (two) times daily with a meal. 09/12/20  Yes Pokhrel, Laxman, MD  Cholecalciferol (VITAMIN D-3) 25 MCG (1000 UT) CAPS Take 1,000 Units by mouth in the morning.   Yes [provider]  doxazosin (CARDURA) 2 MG tablet Take 2 mg by mouth every evening. 07/28/20  Yes [provider]  folic acid (FOLVITE) 1 MG tablet Take 1 mg by mouth daily. 04/17/20  Yes [provider]  furosemide (LASIX) 20 MG tablet Take 3 tablets (60 mg total) by mouth daily. 08/26/20  Yes Marrion Coy, MD  levETIRAcetam (KEPPRA) 750 MG tablet Take 1 tablet (750 mg total) by mouth 2 (two) times daily. 09/12/20  Yes Pokhrel, Laxman, MD  lisinopril (ZESTRIL) 5 MG tablet Take 5 mg by mouth daily. 07/28/20  Yes [provider]  Multiple Vitamin (MULTIVITAMIN ADULT) TABS Take 2 tablets by mouth daily.   Yes [provider]  omeprazole (PRILOSEC) 20 MG capsule Take 20 mg by mouth daily before breakfast. 07/28/20  Yes [provider]  PROAIR HFA [id number] 8532 Base) MCG/ACT inhaler Inhale 2 puffs into the lungs every 6 (six) hours as needed for shortness of breath or wheezing. 07/28/20  Yes [provider]  SPIRIVA HANDIHALER 18 MCG inhalation capsule Place 1 capsule into inhaler and inhale daily. 07/28/20  Yes [provider]  thiamine 100 MG tablet Take 1 tablet (100 mg total) by mouth daily. 08/26/20  Yes Marrion Coy, MD    Review of Systems  Constitutional:  Positive for fatigue. Negative for appetite change.  HENT:  Negative for congestion, postnasal drip and sore  throat.   Eyes: Negative.   Respiratory:  Positive for cough (dry cough) and shortness of breath. Negative for chest tightness.   Cardiovascular:  Positive for leg swelling. Negative for chest pain and palpitations.  Gastrointestinal:  Negative for abdominal distention and abdominal pain.  Endocrine: Negative.   Genitourinary:  Positive for scrotal swelling.  Musculoskeletal:  Negative for back pain and neck pain.  Skin: Negative.   Allergic/Immunologic: Negative.   Neurological:  Positive for light-headedness. Negative for dizziness.  Hematological:  Negative for adenopathy. Does not bruise/bleed easily.  Psychiatric/Behavioral:  Negative for dysphoric mood and sleep disturbance (sleeping on 2 pillows with oxygen). The patient is not nervous/anxious.    Vitals:   11/14/20 0901  BP: 118/81  Pulse: (!) 57  Resp: 18  SpO2: 100%  Weight: 130 lb 2 oz (59 kg)  Height: 5\' 7"  (1.702 m)   Wt Readings from Last 3 Encounters:  11/14/20 130 lb 2 oz (59 kg)  10/07/20 126 lb 4 oz (57.3 kg)  10/02/20 140 lb (63.5 kg)   Lab Results  Component Value Date   CREATININE 1.03 10/02/2020   CREATININE 1.35 (H) 09/14/2020   CREATININE 1.10 09/12/2020    Physical Exam Vitals and nursing note reviewed. Exam conducted with a chaperone present (sister).  Constitutional:      Appearance: Normal appearance.  HENT:     Head: Normocephalic and atraumatic.  Cardiovascular:     Rate and Rhythm: Regular rhythm. Bradycardia present.  Pulmonary:     Effort: Pulmonary effort is normal.     Breath sounds: No wheezing or rales.  Abdominal:     General: There is no distension.     Palpations: Abdomen is soft.     Tenderness: There is no abdominal tenderness.  Musculoskeletal:        General: No tenderness.     Cervical back: Normal range of motion and neck supple.     Right lower leg: Edema (trace pitting) present.     Left lower leg: Edema (trace pitting) present.  Skin:    General: Skin is warm and  dry.  Neurological:     Mental Status: He is alert and oriented to person, place, and time. Mental status is at baseline.  Psychiatric:        Mood and Affect: Mood normal.        Behavior: Behavior normal.   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)- - NYHA class II - euvolemic today - weighing daily and said his weight has been stable; reminded to call for an overnight weight gain of > 2 pounds or a weekly weight gain of > 5 pounds - weight up 4 pounds  from last visit here 5 weeks ago - rarely adding salt and his sister has been reading food labels for sodium content - will  stop lisinopril and begin entresto 24/26mg  BID; 30 day voucher given - meds are bubble packed through his pharmacy so with RX wrote for them to skip a day after last dose of lisinopril before beginning entresto BID - will check BMP next visit - needs to get established with cardiology - consider adding jardiance in the future - discussed palliative care with patient and his sister and the potential benefits - BNP 10/02/20 was 470.7  2: HTN- - BP looks good today - follows with [facility] and sister has to make an appointment with them - BMP 10/02/20 reviewed and showed sodium 142, potassium 4.3, creatinine 1.03 and GFR >60  3: COPD- - wearing oxygen at 4L around the clock - they would like to get the smaller portable oxygen tanks if he's going to remain on this; explained that he needed to get established with pulmonology for long term management of this; their number was put on his AVS for her to call them - says that he hasn't smoked anything for the last few months  4: Lymphedema- - stage 2 - not elevating his legs and he was encouraged to elevate them when sitting for long periods of time - says that PT has stopped but he's not sure why because he has insurance - not wearing compression socks but edema has improved - consider compression  boots if edema persists   Patient did not bring his medications nor a list. Each medication was verbally reviewed with the patient and he was encouraged to bring the bottles to every visit to confirm accuracy of list.   Return in 1 month or sooner for any questions/problems before then.

## 2020-11-14 ENCOUNTER — Other Ambulatory Visit: Payer: Self-pay

## 2020-11-14 ENCOUNTER — Encounter: Payer: Self-pay | Admitting: Family

## 2020-11-14 ENCOUNTER — Ambulatory Visit: Payer: Medicaid Other | Attending: Family | Admitting: Family

## 2020-11-14 VITALS — BP 118/81 | HR 57 | Resp 18 | Ht 67.0 in | Wt 130.1 lb

## 2020-11-14 DIAGNOSIS — I5032 Chronic diastolic (congestive) heart failure: Secondary | ICD-10-CM | POA: Diagnosis not present

## 2020-11-14 DIAGNOSIS — R5383 Other fatigue: Secondary | ICD-10-CM | POA: Insufficient documentation

## 2020-11-14 DIAGNOSIS — I110 Hypertensive heart disease with heart failure: Secondary | ICD-10-CM | POA: Diagnosis not present

## 2020-11-14 DIAGNOSIS — I890 Lymphedema, not elsewhere classified: Secondary | ICD-10-CM

## 2020-11-14 DIAGNOSIS — J431 Panlobular emphysema: Secondary | ICD-10-CM

## 2020-11-14 DIAGNOSIS — R058 Other specified cough: Secondary | ICD-10-CM | POA: Insufficient documentation

## 2020-11-14 DIAGNOSIS — G479 Sleep disorder, unspecified: Secondary | ICD-10-CM | POA: Insufficient documentation

## 2020-11-14 DIAGNOSIS — J449 Chronic obstructive pulmonary disease, unspecified: Secondary | ICD-10-CM | POA: Insufficient documentation

## 2020-11-14 DIAGNOSIS — F17210 Nicotine dependence, cigarettes, uncomplicated: Secondary | ICD-10-CM | POA: Diagnosis not present

## 2020-11-14 DIAGNOSIS — I10 Essential (primary) hypertension: Secondary | ICD-10-CM

## 2020-11-14 DIAGNOSIS — R42 Dizziness and giddiness: Secondary | ICD-10-CM | POA: Diagnosis not present

## 2020-11-14 MED ORDER — SACUBITRIL-VALSARTAN 24-26 MG PO TABS: 1.0000 | ORAL_TABLET | Freq: Two times a day (BID) | ORAL | 5 refills | Status: DC

## 2020-11-14 NOTE — Patient Instructions (Addendum)
Continue weighing daily and call for an overnight weight gain of > 2 pounds or a weekly weight gain of >5 pounds.    Call pulmonology at [facility] (lung doctor) at [phone] and tell them you need to make a new patient appointment. This clinic is attached to the hospital   Call [facility] to make an appointment

## 2020-11-25 ENCOUNTER — Emergency Department: Payer: Medicaid Other

## 2020-11-25 ENCOUNTER — Encounter: Payer: Self-pay | Admitting: Internal Medicine

## 2020-11-25 ENCOUNTER — Inpatient Hospital Stay
Admission: EM | Admit: 2020-11-25 | Discharge: 2020-11-27 | DRG: 190 | Disposition: A | Discharge status: Home Health | Payer: Medicaid Other | Attending: Obstetrics and Gynecology | Admitting: Obstetrics and Gynecology

## 2020-11-25 ENCOUNTER — Other Ambulatory Visit: Payer: Self-pay

## 2020-11-25 DIAGNOSIS — J9601 Acute respiratory failure with hypoxia: Secondary | ICD-10-CM | POA: Diagnosis not present

## 2020-11-25 DIAGNOSIS — Z8673 Personal history of transient ischemic attack (TIA), and cerebral infarction without residual deficits: Secondary | ICD-10-CM

## 2020-11-25 DIAGNOSIS — Z7951 Long term (current) use of inhaled steroids: Secondary | ICD-10-CM | POA: Diagnosis not present

## 2020-11-25 DIAGNOSIS — E785 Hyperlipidemia, unspecified: Secondary | ICD-10-CM | POA: Diagnosis present

## 2020-11-25 DIAGNOSIS — N179 Acute kidney failure, unspecified: Secondary | ICD-10-CM | POA: Diagnosis present

## 2020-11-25 DIAGNOSIS — J9622 Acute and chronic respiratory failure with hypercapnia: Secondary | ICD-10-CM | POA: Diagnosis present

## 2020-11-25 DIAGNOSIS — Z7982 Long term (current) use of aspirin: Secondary | ICD-10-CM

## 2020-11-25 DIAGNOSIS — F17210 Nicotine dependence, cigarettes, uncomplicated: Secondary | ICD-10-CM | POA: Diagnosis present

## 2020-11-25 DIAGNOSIS — K219 Gastro-esophageal reflux disease without esophagitis: Secondary | ICD-10-CM | POA: Diagnosis present

## 2020-11-25 DIAGNOSIS — N183 Chronic kidney disease, stage 3 unspecified: Secondary | ICD-10-CM | POA: Diagnosis present

## 2020-11-25 DIAGNOSIS — J449 Chronic obstructive pulmonary disease, unspecified: Secondary | ICD-10-CM | POA: Diagnosis present

## 2020-11-25 DIAGNOSIS — I130 Hypertensive heart and chronic kidney disease with heart failure and stage 1 through stage 4 chronic kidney disease, or unspecified chronic kidney disease: Secondary | ICD-10-CM | POA: Diagnosis present

## 2020-11-25 DIAGNOSIS — S01312A Laceration without foreign body of left ear, initial encounter: Secondary | ICD-10-CM

## 2020-11-25 DIAGNOSIS — E43 Unspecified severe protein-calorie malnutrition: Secondary | ICD-10-CM | POA: Diagnosis present

## 2020-11-25 DIAGNOSIS — I5030 Unspecified diastolic (congestive) heart failure: Secondary | ICD-10-CM | POA: Diagnosis present

## 2020-11-25 DIAGNOSIS — I10 Essential (primary) hypertension: Secondary | ICD-10-CM | POA: Diagnosis present

## 2020-11-25 DIAGNOSIS — J9621 Acute and chronic respiratory failure with hypoxia: Secondary | ICD-10-CM | POA: Diagnosis present

## 2020-11-25 DIAGNOSIS — Z20822 Contact with and (suspected) exposure to covid-19: Secondary | ICD-10-CM | POA: Diagnosis present

## 2020-11-25 DIAGNOSIS — J441 Chronic obstructive pulmonary disease with (acute) exacerbation: Principal | ICD-10-CM

## 2020-11-25 DIAGNOSIS — I509 Heart failure, unspecified: Secondary | ICD-10-CM

## 2020-11-25 DIAGNOSIS — Z79899 Other long term (current) drug therapy: Secondary | ICD-10-CM

## 2020-11-25 DIAGNOSIS — R0602 Shortness of breath: Secondary | ICD-10-CM | POA: Diagnosis present

## 2020-11-25 DIAGNOSIS — Z87898 Personal history of other specified conditions: Secondary | ICD-10-CM

## 2020-11-25 LAB — CBC WITH DIFFERENTIAL/PLATELET
Abs Immature Granulocytes: 0.01 10*3/uL (ref 0.00–0.07)
Basophils Absolute: 0 10*3/uL (ref 0.0–0.1)
Basophils Relative: 1 %
Eosinophils Absolute: 0 10*3/uL (ref 0.0–0.5)
Eosinophils Relative: 1 %
HCT: 34.2 % — ABNORMAL LOW (ref 39.0–52.0)
Hemoglobin: 10.2 g/dL — ABNORMAL LOW (ref 13.0–17.0)
Immature Granulocytes: 0 %
Lymphocytes Relative: 39 %
Lymphs Abs: 3.2 10*3/uL (ref 0.7–4.0)
MCH: 29.8 pg (ref 26.0–34.0)
MCHC: 29.8 g/dL — ABNORMAL LOW (ref 30.0–36.0)
MCV: 100 fL (ref 80.0–100.0)
Monocytes Absolute: 0.4 10*3/uL (ref 0.1–1.0)
Monocytes Relative: 5 %
Neutro Abs: 4.4 10*3/uL (ref 1.7–7.7)
Neutrophils Relative %: 54 %
Platelets: 183 10*3/uL (ref 150–400)
RBC: 3.42 MIL/uL — ABNORMAL LOW (ref 4.22–5.81)
RDW: 12.6 % (ref 11.5–15.5)
WBC: 8 10*3/uL (ref 4.0–10.5)
nRBC: 0 % (ref 0.0–0.2)

## 2020-11-25 LAB — COMPREHENSIVE METABOLIC PANEL
ALT: 9 U/L (ref 0–44)
AST: 14 U/L — ABNORMAL LOW (ref 15–41)
Albumin: 3.9 g/dL (ref 3.5–5.0)
Alkaline Phosphatase: 52 U/L (ref 38–126)
Anion gap: 7 (ref 5–15)
BUN: 36 mg/dL — ABNORMAL HIGH (ref 6–20)
CO2: 38 mmol/L — ABNORMAL HIGH (ref 22–32)
Calcium: 9.3 mg/dL (ref 8.9–10.3)
Chloride: 102 mmol/L (ref 98–111)
Creatinine, Ser: 1.59 mg/dL — ABNORMAL HIGH (ref 0.61–1.24)
GFR, Estimated: 49 mL/min — ABNORMAL LOW (ref >60)
Glucose, Bld: 117 mg/dL — ABNORMAL HIGH (ref 70–99)
Potassium: 4.4 mmol/L (ref 3.5–5.1)
Sodium: 147 mmol/L — ABNORMAL HIGH (ref 135–145)
Total Bilirubin: 0.5 mg/dL (ref 0.3–1.2)
Total Protein: 7.7 g/dL (ref 6.5–8.1)

## 2020-11-25 LAB — URINALYSIS, COMPLETE (UACMP) WITH MICROSCOPIC
Bilirubin Urine: NEGATIVE
Glucose, UA: NEGATIVE mg/dL
Hgb urine dipstick: NEGATIVE
Ketones, ur: NEGATIVE mg/dL
Leukocytes,Ua: NEGATIVE
Nitrite: NEGATIVE
Protein, ur: 100 mg/dL — AB
Specific Gravity, Urine: 1.011 (ref 1.005–1.030)
Squamous Epithelial / HPF: NONE SEEN (ref 0–5)
pH: 5 (ref 5.0–8.0)

## 2020-11-25 LAB — RESP PANEL BY RT-PCR (FLU A&B, COVID) ARPGX2
Influenza A by PCR: NEGATIVE
Influenza B by PCR: NEGATIVE
SARS Coronavirus 2 by RT PCR: NEGATIVE

## 2020-11-25 LAB — TROPONIN I (HIGH SENSITIVITY)
Troponin I (High Sensitivity): 24 ng/L — ABNORMAL HIGH (ref <18)
Troponin I (High Sensitivity): 22 ng/L — ABNORMAL HIGH (ref <18)

## 2020-11-25 LAB — LACTIC ACID, PLASMA
Lactic Acid, Venous: 0.9 mmol/L (ref 0.5–1.9)
Lactic Acid, Venous: 0.8 mmol/L (ref 0.5–1.9)

## 2020-11-25 LAB — GLUCOSE, CAPILLARY: Glucose-Capillary: 109 mg/dL — ABNORMAL HIGH (ref 70–99)

## 2020-11-25 LAB — BRAIN NATRIURETIC PEPTIDE: B Natriuretic Peptide: 575.8 pg/mL — ABNORMAL HIGH (ref 0.0–100.0)

## 2020-11-25 LAB — TSH: TSH: 1.018 u[IU]/mL (ref 0.350–4.500)

## 2020-11-25 LAB — MRSA NEXT GEN BY PCR, NASAL: MRSA by PCR Next Gen: NOT DETECTED

## 2020-11-25 MED ORDER — ATORVASTATIN CALCIUM 20 MG PO TABS: 20.0000 mg | ORAL_TABLET | Freq: Every evening | ORAL | Status: DC

## 2020-11-25 MED ORDER — PANTOPRAZOLE SODIUM 40 MG PO TBEC
40.0000 mg | DELAYED_RELEASE_TABLET | Freq: Every day | ORAL | Status: DC
  Administered 2020-11-26 – 2020-11-27 (×2): 40 mg via ORAL
  Filled 2020-11-25 (×2): qty 1

## 2020-11-25 MED ORDER — IPRATROPIUM-ALBUTEROL 0.5-2.5 (3) MG/3ML IN SOLN
3.0000 mL | Freq: Three times a day (TID) | RESPIRATORY_TRACT | Status: DC
  Administered 2020-11-25 – 2020-11-27 (×5): 3 mL via RESPIRATORY_TRACT
  Filled 2020-11-25 (×5): qty 3

## 2020-11-25 MED ORDER — ORAL CARE MOUTH RINSE
15.0000 mL | Freq: Two times a day (BID) | OROMUCOSAL | Status: DC
  Administered 2020-11-25 – 2020-11-26 (×3): 15 mL via OROMUCOSAL
  Filled 2020-11-25: qty 15

## 2020-11-25 MED ORDER — CARVEDILOL 12.5 MG PO TABS
25.0000 mg | ORAL_TABLET | Freq: Two times a day (BID) | ORAL | Status: DC
  Administered 2020-11-25 – 2020-11-27 (×4): 25 mg via ORAL
  Filled 2020-11-25 (×4): qty 2

## 2020-11-25 MED ORDER — ACETAMINOPHEN 650 MG RE SUPP: 650.0000 mg | Freq: Four times a day (QID) | RECTAL | Status: DC | PRN

## 2020-11-25 MED ORDER — IPRATROPIUM-ALBUTEROL 0.5-2.5 (3) MG/3ML IN SOLN
3.0000 mL | Freq: Once | RESPIRATORY_TRACT | Status: AC
  Administered 2020-11-25: 3 mL via RESPIRATORY_TRACT
  Filled 2020-11-25: qty 3

## 2020-11-25 MED ORDER — ATORVASTATIN CALCIUM 20 MG PO TABS
20.0000 mg | ORAL_TABLET | Freq: Every evening | ORAL | Status: DC
  Administered 2020-11-25 – 2020-11-26 (×2): 20 mg via ORAL
  Filled 2020-11-25 (×2): qty 1

## 2020-11-25 MED ORDER — ONDANSETRON HCL 4 MG/2ML IJ SOLN: 4.0000 mg | Freq: Four times a day (QID) | INTRAMUSCULAR | Status: DC | PRN

## 2020-11-25 MED ORDER — FUROSEMIDE 10 MG/ML IJ SOLN
40.0000 mg | Freq: Once | INTRAMUSCULAR | Status: AC
  Administered 2020-11-25: 40 mg via INTRAVENOUS
  Filled 2020-11-25: qty 4

## 2020-11-25 MED ORDER — LEVETIRACETAM 750 MG PO TABS
750.0000 mg | ORAL_TABLET | Freq: Two times a day (BID) | ORAL | Status: DC
  Administered 2020-11-25 – 2020-11-27 (×4): 750 mg via ORAL
  Filled 2020-11-25 (×6): qty 1

## 2020-11-25 MED ORDER — THIAMINE HCL 100 MG PO TABS
100.0000 mg | ORAL_TABLET | Freq: Every day | ORAL | Status: DC
  Administered 2020-11-25 – 2020-11-27 (×3): 100 mg via ORAL
  Filled 2020-11-25 (×3): qty 1

## 2020-11-25 MED ORDER — DOXAZOSIN MESYLATE 2 MG PO TABS
2.0000 mg | ORAL_TABLET | Freq: Every evening | ORAL | Status: DC
  Administered 2020-11-25 – 2020-11-26 (×2): 2 mg via ORAL
  Filled 2020-11-25 (×3): qty 1

## 2020-11-25 MED ORDER — METHYLPREDNISOLONE SODIUM SUCC 40 MG IJ SOLR
40.0000 mg | Freq: Two times a day (BID) | INTRAMUSCULAR | Status: DC
  Administered 2020-11-25: 40 mg via INTRAVENOUS
  Filled 2020-11-25: qty 1

## 2020-11-25 MED ORDER — ASPIRIN EC 81 MG PO TBEC
81.0000 mg | DELAYED_RELEASE_TABLET | Freq: Every morning | ORAL | Status: DC
  Administered 2020-11-26 – 2020-11-27 (×2): 81 mg via ORAL
  Filled 2020-11-25 (×2): qty 1

## 2020-11-25 MED ORDER — FUROSEMIDE 8 MG/ML PO SOLN: 40.0000 mg | Freq: Once | ORAL | Status: DC

## 2020-11-25 MED ORDER — METHYLPREDNISOLONE SODIUM SUCC 125 MG IJ SOLR
125.0000 mg | Freq: Once | INTRAMUSCULAR | Status: AC
  Administered 2020-11-25: 125 mg via INTRAVENOUS
  Filled 2020-11-25: qty 2

## 2020-11-25 MED ORDER — ACETAMINOPHEN 500 MG PO TABS: 1000.0000 mg | ORAL_TABLET | Freq: Four times a day (QID) | ORAL | Status: DC | PRN

## 2020-11-25 MED ORDER — AMLODIPINE BESYLATE 5 MG PO TABS
10.0000 mg | ORAL_TABLET | Freq: Every evening | ORAL | Status: DC
  Administered 2020-11-25 – 2020-11-26 (×2): 10 mg via ORAL
  Filled 2020-11-25 (×2): qty 2

## 2020-11-25 MED ORDER — FUROSEMIDE 10 MG/ML IJ SOLN
20.0000 mg | Freq: Two times a day (BID) | INTRAMUSCULAR | Status: DC
  Administered 2020-11-25: 20 mg via INTRAVENOUS
  Filled 2020-11-25: qty 2

## 2020-11-25 MED ORDER — ONDANSETRON HCL 4 MG PO TABS: 4.0000 mg | ORAL_TABLET | Freq: Four times a day (QID) | ORAL | Status: DC | PRN

## 2020-11-25 MED ORDER — ENOXAPARIN SODIUM 40 MG/0.4ML IJ SOSY
40.0000 mg | PREFILLED_SYRINGE | Freq: Every day | INTRAMUSCULAR | Status: DC
  Administered 2020-11-25 – 2020-11-26 (×2): 40 mg via SUBCUTANEOUS
  Filled 2020-11-25 (×2): qty 0.4

## 2020-11-25 MED ORDER — LORAZEPAM 2 MG/ML IJ SOLN: 1.0000 mg | INTRAMUSCULAR | Status: DC | PRN

## 2020-11-25 MED ORDER — ADULT MULTIVITAMIN W/MINERALS CH
2.0000 | ORAL_TABLET | Freq: Every day | ORAL | Status: DC
  Administered 2020-11-25 – 2020-11-27 (×3): 2 via ORAL
  Filled 2020-11-25 (×3): qty 2

## 2020-11-25 MED ORDER — FUROSEMIDE 10 MG/ML IJ SOLN: 40.0000 mg | Freq: Once | INTRAMUSCULAR | Status: DC

## 2020-11-25 MED ORDER — LORAZEPAM 2 MG/ML IJ SOLN: 2.0000 mg | INTRAMUSCULAR | Status: DC | PRN

## 2020-11-25 MED ORDER — CHLORHEXIDINE GLUCONATE 0.12 % MT SOLN
15.0000 mL | Freq: Two times a day (BID) | OROMUCOSAL | Status: DC
  Administered 2020-11-25 – 2020-11-27 (×5): 15 mL via OROMUCOSAL
  Filled 2020-11-25 (×4): qty 15

## 2020-11-25 MED ORDER — FOLIC ACID 1 MG PO TABS
1.0000 mg | ORAL_TABLET | Freq: Every day | ORAL | Status: DC
  Administered 2020-11-25 – 2020-11-27 (×3): 1 mg via ORAL
  Filled 2020-11-25 (×3): qty 1

## 2020-11-25 MED ORDER — CHLORHEXIDINE GLUCONATE CLOTH 2 % EX PADS
6.0000 | MEDICATED_PAD | Freq: Every day | CUTANEOUS | Status: DC
  Administered 2020-11-25 – 2020-11-26 (×2): 6 via TOPICAL
  Filled 2020-11-25: qty 6

## 2020-11-25 MED ORDER — FUROSEMIDE 10 MG/ML IJ SOLN: 40.0000 mg | Freq: Two times a day (BID) | INTRAMUSCULAR | Status: DC

## 2020-11-25 NOTE — H&P (Signed)
History and Physical   Darin Hopkins ASN:[id number] DOB: 21-Aug-1960 DOA: 11/25/2020  PCP: Martie Round, NP  Outpatient Specialists: Dr. Meredeth Ide, pulmonology Patient coming from: home   I have personally briefly reviewed patient's old medical records in [facility] EMR.  Chief Concern: shortness of breath  HPI: Darin Hopkins is a 60 y.o. male with medical history significant for hypertension, GERD, history of seizure, last seizure was in May 2022, CKD3, hyperlipidemia, COPD, history of CVA about 2.5 years ago, tobacco use, presents to the ED for chief concern of shortness of breath.   He reports that the shortness of breath started AM prior to hospitalization.  He states that the shortness of breath is worse with exertion.  States he ran out of his inhaler for 1 day.  He denies fever, sick contacts, cough, chest pain, abdominal pain, dysuria, syncope, loss of consciousness, dysuria, diarrhea.  He denies nausea and vomiting.  He states he tries to drink a lot of water and he does not know the quantity in which he drinks fluid.  He denies drinking soda, tea, EtOH.  He states specifically that his last EtOH drink was December 2021.  He reports improvement with BiPAP.  Social history: He lives at home with his sister.  He states he smokes 2 to 4 cigarettes/day.  He states he does not drink anymore alcohol and that his last drink was several months ago.  Denies recreational drug use.  Vaccination history: He states he is vaccinated for COVID-19, 2 doses  ROS: Constitutional: no weight change, no fever ENT/Mouth: no sore throat, no rhinorrhea Eyes: no eye pain, no vision changes Cardiovascular: no chest pain, no dyspnea,  no edema, no palpitations Respiratory: no cough, no sputum, no wheezing Gastrointestinal: no nausea, no vomiting, no diarrhea, no constipation Genitourinary: no urinary incontinence, no dysuria, no hematuria Musculoskeletal: no arthralgias, no myalgias Skin: no skin  lesions, no pruritus, Neuro: + weakness, no loss of consciousness, no syncope Psych: no anxiety, no depression, + decrease appetite Heme/Lymph: no bruising, no bleeding  ED Course: Discussed with emergency medicine provider, patient requiring hospitalization for acute hypoxic respiratory failure.  Vitals in the emergency department was remarkable for temperature 99.6, respiration rate of 22, heart rate of 63, blood pressure 142/96, SPO2 of 80% on 6 L nasal cannula.  Patient was placed on BiPAP with improvement.  In the ED, patient was given Lasix 40 mg IV, duo nebs 3 treatments, Solu-Medrol 125 mg IV, and started on BiPAP.  Labs in the emergency department was remarkable for BNP 575.8, troponin 24, lactic acid 0.9, COVID is negative.  Serum creatinine 1.59, nonfasting blood glucose 117, BUN 36, sodium 147, potassium 4.4, WBC 8, hemoglobin 10.2, platelets 183.  Assessment/Plan  Principal Problem:   Acute hypoxemic respiratory failure (HCC) Active Problems:   Acute exacerbation of CHF (congestive heart failure) (HCC)   Essential hypertension   History of seizure   COPD (chronic obstructive pulmonary disease) (HCC)   Hyperlipidemia   GERD (gastroesophageal reflux disease)   Protein-calorie malnutrition, severe   COPD exacerbation (HCC)   Chronic kidney disease, stage III (moderate) (HCC)   # Acute hypoxemic respiratory failure I suspect this is secondary to heart failure exacerbation/volume overload - Complete echo was not reordered as patient recently had an echo in April 2022, please see below - Continue BiPAP - Status post Lasix 40 mg IV per EDP - Ordered additional Lasix 20 mg IV twice daily, 2 doses ordered - Strict I's and O's -  DuoNebs 3 times daily scheduled - Admit to inpatient stepdown with telemetry  # Acute kidney injury-I suspect this is secondary to cardiorenal - Serum creatinine on admission is 1.59 - No prior CKD history - Treat as above with Lasix - BMP in  the a.m.  # Hypertension-initially mildly elevated - Resumed amlodipine 10 mg daily, carvedilol 25 mg twice daily, doxazosin 2 mg nightly  # History of seizure-continue Keppra 750 mg p.o. twice daily, Ativan 1 mg IV every 2 hours as needed for seizure, Ativan 2 mg IV as needed once for breakthrough seizures, 1 dose ordered - Seizure precaution  # COPD-DuoNebs scheduled as above # Hyperlipidemia-atorvastatin 20 mg nightly resumed  Chart reviewed.  Hospitalization from 09/09/2020 to 09/17/2020: For COPD exacerbation, and had status epilepticus presumed secondary to EtOH versus medication nonadherence.  Patient required intubation for airway protection.  He was ultimately transferred to [facility] for long-term monitoring EEG and further management of status epilepticus.  Echo on 08/18/2020 was read as estimated ejection fraction of 60 to 65%, RVSP 21.7 mmHg.  DVT prophylaxis: Enoxaparin 40 mg subcutaneous daily Code Status: full code Diet: Heart healthy Family Communication: No Disposition Plan: Pending clinical course Consults called: None at this time Admission status: Stepdown, inpatient, telemetry  Past Medical History:  Diagnosis Date   CHF (congestive heart failure) (HCC)    COPD (chronic obstructive pulmonary disease) (HCC)    Hypertension    Seizures (HCC)    No past surgical history on file.  Social History:  reports that he has quit smoking. His smoking use included cigarettes. He smoked an average of 2 packs per day. He has never used smokeless tobacco. He reports current alcohol use of about 3.0 standard drinks of alcohol per week. He reports previous drug use.  No Known Allergies No family history on file. Family history: Family history reviewed and not pertinent  Prior to Admission medications   Medication Sig Start Date End Date Taking? Authorizing Provider  amLODipine (NORVASC) 10 MG tablet Take 10 mg by mouth every evening. 07/28/20   [provider]   aspirin 81 MG EC tablet Take 81 mg by mouth in the morning.    [provider]  atorvastatin (LIPITOR) 20 MG tablet Take 20 mg by mouth every evening. 07/28/20   [provider]  carvedilol (COREG) 25 MG tablet Take 1 tablet (25 mg total) by mouth 2 (two) times daily with a meal. 09/12/20   Pokhrel, Laxman, MD  Cholecalciferol (VITAMIN D-3) 25 MCG (1000 UT) CAPS Take 1,000 Units by mouth in the morning.    [provider]  doxazosin (CARDURA) 2 MG tablet Take 2 mg by mouth every evening. 07/28/20   [provider]  folic acid (FOLVITE) 1 MG tablet Take 1 mg by mouth daily. 04/17/20   [provider]  furosemide (LASIX) 20 MG tablet Take 3 tablets (60 mg total) by mouth daily. 08/26/20   Marrion Coy, MD  levETIRAcetam (KEPPRA) 750 MG tablet Take 1 tablet (750 mg total) by mouth 2 (two) times daily. 09/12/20   Pokhrel, Rebekah Chesterfield, MD  Multiple Vitamin (MULTIVITAMIN ADULT) TABS Take 2 tablets by mouth daily.    [provider]  omeprazole (PRILOSEC) 20 MG capsule Take 20 mg by mouth daily before breakfast. 07/28/20   [provider]  PROAIR HFA 108 (90 Base) MCG/ACT inhaler Inhale 2 puffs into the lungs every 6 (six) hours as needed for shortness of breath or wheezing. 07/28/20   [provider]  sacubitril-valsartan (ENTRESTO) 24-26 MG Take 1 tablet by mouth 2 (two) times daily. 11/14/20   Delma FreezeHackney, Tina A, FNP  SPIRIVA HANDIHALER 18 MCG inhalation capsule Place 1 capsule into inhaler and inhale daily. 07/28/20   [provider]  thiamine 100 MG tablet Take 1 tablet (100 mg total) by mouth daily. 08/26/20   Marrion CoyZhang, Dekui, MD   Physical Exam: Vitals:   11/25/20 1220 11/25/20 1224 11/25/20 1230 11/25/20 1245  BP:      Pulse:  (!) 55 (!) 52 (!) 54  Resp:  15 16 12   Temp:      TempSrc:      SpO2: 95% 99% 100% 99%  Weight:      Height:       Constitutional: appears age-appropriate, frail, NAD, calm, comfortable Eyes: PERRL, lids and  conjunctivae normal ENMT: Mucous membranes are moist. Posterior pharynx clear of any exudate or lesions. Age-appropriate dentition. Hearing appropriate Neck: normal, supple, no masses, no thyromegaly Respiratory: clear to auscultation bilaterally, no wheezing, no crackles. Normal respiratory effort. No accessory muscle use.  Cardiovascular: Regular rate and rhythm, no murmurs / rubs / gallops.  Bilateral 1+ pitting edema around ankles. 2+ pedal pulses. No carotid bruits.  Abdomen: no tenderness, no masses palpated, no hepatosplenomegaly. Bowel sounds positive.  Musculoskeletal: no clubbing / cyanosis. No joint deformity upper and lower extremities. Good ROM, no contractures, no atrophy. Normal muscle tone.  Skin: no rashes, lesions, ulcers. No induration Neurologic: Sensation intact. Strength 5/5 in all 4.  Psychiatric: Normal judgment and insight. Alert and oriented x 3. Normal mood.   EKG: independently reviewed, showing sinus rhythm with rate of 56, qtc 467  Chest x-ray on Admission: I personally reviewed and I agree with radiologist reading as below.  DG Chest Portable 1 View  Result Date: 11/25/2020 CLINICAL DATA:  Pt is very SOB. Hx of COPD, HTN, and CHF. EXAM: PORTABLE CHEST - 1 VIEW COMPARISON:  10/02/2020 FINDINGS: Lungs are clear. Heart size and mediastinal contours are within normal limits. No effusion.  No pneumothorax. Visualized bones unremarkable. IMPRESSION: No acute cardiopulmonary disease. Electronically Signed   By: Corlis Leak  Hassell M.D.   On: 11/25/2020 12:22    Labs on Admission: I have personally reviewed following labs  CBC: Recent Labs  Lab 11/25/20 1204  WBC 8.0  NEUTROABS 4.4  HGB 10.2*  HCT 34.2*  MCV 100.0  PLT 183   Basic Metabolic Panel: Recent Labs  Lab 11/25/20 1204  NA 147*  K 4.4  CL 102  CO2 38*  GLUCOSE 117*  BUN 36*  CREATININE 1.59*  CALCIUM 9.3   GFR: Estimated Creatinine Clearance: 46.1 mL/min (A) (by C-G formula based on SCr of 1.59  mg/dL (H)).  Liver Function Tests: Recent Labs  Lab 11/25/20 1204  AST 14*  ALT 9  ALKPHOS 52  BILITOT 0.5  PROT 7.7  ALBUMIN 3.9   Urine analysis:    Component Value Date/Time   COLORURINE STRAW (A) 11/25/2020 1204   APPEARANCEUR CLEAR (A) 11/25/2020 1204   APPEARANCEUR Clear 11/02/2013 0205   LABSPEC 1.011 11/25/2020 1204   LABSPEC 1.005 11/02/2013 0205   PHURINE 5.0 11/25/2020 1204   GLUCOSEU NEGATIVE 11/25/2020 1204   GLUCOSEU Negative 11/02/2013 0205   HGBUR NEGATIVE 11/25/2020 1204   BILIRUBINUR NEGATIVE 11/25/2020 1204   BILIRUBINUR Negative 11/02/2013 0205   KETONESUR NEGATIVE 11/25/2020 1204   PROTEINUR 100 (A) 11/25/2020 1204   NITRITE NEGATIVE 11/25/2020 1204   LEUKOCYTESUR NEGATIVE 11/25/2020 1204  LEUKOCYTESUR Negative 11/02/2013 0205   Dr. Sedalia Muta [facility]  If 7PM-7AM, please contact overnight-coverage provider If 7AM-7PM, please contact day coverage provider [email or web address]  11/25/2020, 2:42 PM

## 2020-11-25 NOTE — ED Triage Notes (Signed)
Pt coming from home via EMS. C/C SOB, pt was originally on 3L [place] oxygen saturation low 80's, placed on Non-Rebreather which increased sat to 100%. Pt AOx4 , scrotum appears swollen   Hx, Seizure , CHF

## 2020-11-25 NOTE — ED Notes (Signed)
Russell RN aware of assigned bed 

## 2020-11-25 NOTE — ED Notes (Signed)
Report given to ICU

## 2020-11-25 NOTE — ED Notes (Signed)
Respiratory therapy at bedside.

## 2020-11-25 NOTE — ED Notes (Signed)
Called Respiratory pt going to room ICU 19

## 2020-11-25 NOTE — ED Provider Notes (Addendum)
[facility] Emergency Department Provider Note  ____________________________________________   Event Date/Time   First MD Initiated Contact with Patient 11/25/20 1133     (approximate)  I have reviewed the triage vital signs and the nursing notes.   HISTORY  Chief Complaint Shortness of Breath    HPI Darin Hopkins is a 60 y.o. male presents emergency department via EMS from home with shortness of breath.  Patient has history of CHF, COPD, hypertension, seizures, and history of stroke.  Patient states he is on 4 L 02 via nasal cannula at home, states his O2 saturations are around 92-93 on a normal day.  He denies any fever or chills.  States he is having difficulty breathing today.  No swelling in extremities.  EMS states that the lung sounds on the left are diminished.  Past Medical History:  Diagnosis Date   CHF (congestive heart failure) (HCC)    COPD (chronic obstructive pulmonary disease) (HCC)    Hypertension    Seizures (HCC)     Patient Active Problem List   Diagnosis Date Noted   Status epilepticus (HCC) 09/09/2020   COPD exacerbation (HCC) 08/27/2020   Protein-calorie malnutrition, severe 08/21/2020   Acute hypercapnic respiratory failure (HCC)    Acute decompensated heart failure (HCC) 08/18/2020   Acute exacerbation of CHF (congestive heart failure) (HCC) 08/17/2020   Essential hypertension 08/17/2020   History of seizure 08/17/2020   COPD (chronic obstructive pulmonary disease) (HCC) 08/17/2020   Hyperlipidemia 08/17/2020   GERD (gastroesophageal reflux disease) 08/17/2020   Alcohol abuse 08/17/2020   Chronic kidney disease, stage III (moderate) (HCC) 10/24/2012    No past surgical history on file.  Prior to Admission medications   Medication Sig Start Date End Date Taking? Authorizing Provider  amLODipine (NORVASC) 10 MG tablet Take 10 mg by mouth every evening. 07/28/20   [provider]  aspirin 81 MG EC tablet Take  81 mg by mouth in the morning.    [provider]  atorvastatin (LIPITOR) 20 MG tablet Take 20 mg by mouth every evening. 07/28/20   [provider]  carvedilol (COREG) 25 MG tablet Take 1 tablet (25 mg total) by mouth 2 (two) times daily with a meal. 09/12/20   Pokhrel, Laxman, MD  Cholecalciferol (VITAMIN D-3) 25 MCG (1000 UT) CAPS Take 1,000 Units by mouth in the morning.    [provider]  doxazosin (CARDURA) 2 MG tablet Take 2 mg by mouth every evening. 07/28/20   [provider]  folic acid (FOLVITE) 1 MG tablet Take 1 mg by mouth daily. 04/17/20   [provider]  furosemide (LASIX) 20 MG tablet Take 3 tablets (60 mg total) by mouth daily. 08/26/20   Marrion CoyZhang, Dekui, MD  levETIRAcetam (KEPPRA) 750 MG tablet Take 1 tablet (750 mg total) by mouth 2 (two) times daily. 09/12/20   Pokhrel, Rebekah ChesterfieldLaxman, MD  Multiple Vitamin (MULTIVITAMIN ADULT) TABS Take 2 tablets by mouth daily.    [provider]  omeprazole (PRILOSEC) 20 MG capsule Take 20 mg by mouth daily before breakfast. 07/28/20   [provider]  PROAIR HFA 108 (90 Base) MCG/ACT inhaler Inhale 2 puffs into the lungs every 6 (six) hours as needed for shortness of breath or wheezing. 07/28/20   [provider]  sacubitril-valsartan (ENTRESTO) 24-26 MG Take 1 tablet by mouth 2 (two) times daily. 11/14/20   Delma FreezeHackney, Tina A, FNP  SPIRIVA HANDIHALER 18 MCG inhalation capsule Place 1 capsule into inhaler  and inhale daily. 07/28/20   [provider]  thiamine 100 MG tablet Take 1 tablet (100 mg total) by mouth daily. 08/26/20   Marrion Coy, MD    Allergies Patient has no known allergies.  No family history on file.  Social History Social History   Tobacco Use   Smoking status: Former    Packs/day: 2.00    Types: Cigarettes   Smokeless tobacco: Never  Vaping Use   Vaping Use: Never used  Substance Use Topics   Alcohol use: Yes    Alcohol/week: 3.0 standard drinks     Types: 3 Cans of beer per week    Comment: daily   Drug use: Not Currently    Review of Systems  Constitutional: No fever/chills Eyes: No visual changes. ENT: No sore throat. Respiratory: Denies cough, positive difficulty breathing Cardiovascular: Denies chest pain Gastrointestinal: Denies abdominal pain Genitourinary: Negative for dysuria. Musculoskeletal: Negative for back pain. Skin: Negative for rash. Psychiatric: no mood changes,     ____________________________________________   PHYSICAL EXAM:  VITAL SIGNS: ED Triage Vitals  Enc Vitals Group     BP      Pulse      Resp      Temp      Temp src      SpO2      Weight      Height      Head Circumference      Peak Flow      Pain Score      Pain Loc      Pain Edu?      Excl. in GC?     Constitutional: Alert and oriented. Well appearing and in no acute distress. Eyes: Conjunctivae are normal.  Head: Atraumatic.  Laceration noted behind the left ear, Nose: No congestion/rhinnorhea. Mouth/Throat: Mucous membranes are moist.   Neck:  supple no lymphadenopathy noted Cardiovascular: Normal rate, regular rhythm. Heart sounds are normal Respiratory: Increased respiratory effort.  No retractions, lungs c diminished lung sounds Abd: soft nontender bs normal all 4 quad GU: deferred Musculoskeletal: FROM all extremities, warm and well perfused Neurologic:  Normal speech and language.  Skin:  Skin is warm, dry,  No rash noted. Psychiatric: Mood and affect are normal. Speech and behavior are normal.  ____________________________________________   LABS (all labs ordered are listed, but only abnormal results are displayed)  Labs Reviewed  COMPREHENSIVE METABOLIC PANEL - Abnormal; Notable for the following components:      Result Value   Sodium 147 (*)    CO2 38 (*)    Glucose, Bld 117 (*)    BUN 36 (*)    Creatinine, Ser 1.59 (*)    AST 14 (*)    GFR, Estimated 49 (*)    All other components within normal  limits  CBC WITH DIFFERENTIAL/PLATELET - Abnormal; Notable for the following components:   RBC 3.42 (*)    Hemoglobin 10.2 (*)    HCT 34.2 (*)    MCHC 29.8 (*)    All other components within normal limits  URINALYSIS, COMPLETE (UACMP) WITH MICROSCOPIC - Abnormal; Notable for the following components:   Color, Urine STRAW (*)    APPearance CLEAR (*)    Protein, ur 100 (*)    Bacteria, UA MANY (*)    All other components within normal limits  BRAIN NATRIURETIC PEPTIDE - Abnormal; Notable for the following components:   B Natriuretic Peptide 575.8 (*)    All other components within normal limits  BLOOD GAS, VENOUS - Abnormal; Notable for the following components:   pCO2, Ven 92 (*)    Bicarbonate 42.2 (*)    Acid-Base Excess 12.3 (*)    All other components within normal limits  TROPONIN I (HIGH SENSITIVITY) - Abnormal; Notable for the following components:   Troponin I (High Sensitivity) 24 (*)    All other components within normal limits  RESP PANEL BY RT-PCR (FLU A&B, COVID) ARPGX2  CULTURE, BLOOD (SINGLE)  LACTIC ACID, PLASMA  LACTIC ACID, PLASMA  TROPONIN I (HIGH SENSITIVITY)   ____________________________________________   ____________________________________________  RADIOLOGY  Chest x-ray  ____________________________________________   PROCEDURES  Procedure(s) performed: No  ..Laceration Repair  Date/Time: 11/25/2020 12:31 PM Performed by: Faythe Ghee, PA-C Authorized by: Faythe Ghee, PA-C   Consent:    Consent obtained:  Verbal   Consent given by:  Patient   Risks discussed:  Infection, pain, poor cosmetic result and poor wound healing   Alternatives discussed:  No treatment Universal protocol:    Procedure explained and questions answered to patient or proxy's satisfaction: yes     Patient identity confirmed:  Verbally with patient Anesthesia:    Anesthesia method:  None Laceration details:    Location:  Ear   Ear location:  L ear    Length (cm):  3 Pre-procedure details:    Preparation:  Patient was prepped and draped in usual sterile fashion Exploration:    Limited defect created (wound extended): no     Hemostasis achieved with:  Direct pressure   Imaging outcome: foreign body not noted     Wound exploration: wound explored through full range of motion     Wound extent: no foreign bodies/material noted, no muscle damage noted, no underlying fracture noted and no vascular damage noted   Treatment:    Area cleansed with:  Saline   Amount of cleaning:  Standard   Irrigation solution:  Sterile saline   Irrigation method:  Tap Skin repair:    Repair method:  Tissue adhesive Approximation:    Approximation:  Close Repair type:    Repair type:  Simple Post-procedure details:    Dressing:  Open (no dressing)   Procedure completion:  Tolerated well, no immediate complications  CRITICAL CARE Performed by: Faythe Ghee   Total critical care time: 45 minutes  Critical care time  was exclusive of separately billable procedures and treating other patients.  Critical care was necessary to treat or prevent imminent or life-threatening deterioration.  Critical care was time spent personally by me on the following activities: development of treatment plan with patient and/or surrogate as well as nursing, discussions with consultants, evaluation of patient's response to treatment, examination of patient, obtaining history from patient or surrogate, ordering and performing treatments and interventions, ordering and review of laboratory studies, ordering and review of radiographic studies, pulse oximetry and re-evaluation of patient's condition. 45   ____________________________________________   INITIAL IMPRESSION / ASSESSMENT AND PLAN / ED COURSE  Pertinent labs & imaging results that were available during my care of the patient were reviewed by me and considered in my medical decision making (see chart for  details).   Patient 60 year old male presents emergency department with difficulty breathing.  See HPI.  Physical exam shows patient to be in mild respiratory distress.  Will monitor to see if patient needs to be put on BiPAP.   ----------------------------------------- 12:31 PM on 11/25/2020 ----------------------------------------- Patient placed on BiPAP, O2 saturation increased to 97%  See  procedure note for laceration repair.  Chest x-ray reviewed by me confirmed by radiology to be negative for pneumonia or CHF  VBG shows a CO2 of 92, patient is hypercapnic  Patient's urinalysis is normal, respiratory panel is normal, BMP shows 578.8 which is elevated, troponin is elevated at 24, comprehensive metabolic panel has elevated sodium of 147, BUN and creatinine are elevated consider AKI, CBC is decreased H&H.  Dr. Alfred Levins to speak with hospitalist for admission   Darin Hopkins was evaluated in Emergency Department on 11/25/2020 for the symptoms described in the history of present illness. He was evaluated in the context of the global COVID-19 pandemic, which necessitated consideration that the patient might be at risk for infection with the SARS-CoV-2 virus that causes COVID-19. Institutional protocols and algorithms that pertain to the evaluation of patients at risk for COVID-19 are in a state of rapid change based on information released by regulatory bodies including the CDC and federal and state organizations. These policies and algorithms were followed during the patient's care in the ED.    As part of my medical decision making, I reviewed the following data within the electronic MEDICAL RECORD NUMBER Nursing notes reviewed and incorporated, Labs reviewed , EKG interpreted sinus rhythm, Old EKG reviewed, Old chart reviewed, Radiograph reviewed , Discussed with admitting physician , Evaluated by EM attending , Notes from prior ED visits, and [place] Controlled Substance  Database  ____________________________________________   FINAL CLINICAL IMPRESSION(S) / ED DIAGNOSES  Final diagnoses:  Acute respiratory failure with hypoxia and hypercapnia (HCC)  COPD exacerbation (HCC)  Diastolic congestive heart failure, unspecified HF chronicity (HCC)  Laceration of left ear, initial encounter      NEW MEDICATIONS STARTED DURING THIS VISIT:  New Prescriptions   No medications on file     Note:  This document was prepared using Dragon voice recognition software and may include unintentional dictation errors.    Faythe Ghee, PA-C 11/25/20 1427    Faythe Ghee, PA-C 11/25/20 1428    Concha Se, MD 11/27/20 [phone]

## 2020-11-26 DIAGNOSIS — J9601 Acute respiratory failure with hypoxia: Secondary | ICD-10-CM | POA: Diagnosis not present

## 2020-11-26 LAB — BASIC METABOLIC PANEL
Anion gap: 10 (ref 5–15)
BUN: 41 mg/dL — ABNORMAL HIGH (ref 6–20)
CO2: 35 mmol/L — ABNORMAL HIGH (ref 22–32)
Calcium: 9 mg/dL (ref 8.9–10.3)
Chloride: 99 mmol/L (ref 98–111)
Creatinine, Ser: 1.57 mg/dL — ABNORMAL HIGH (ref 0.61–1.24)
GFR, Estimated: 50 mL/min — ABNORMAL LOW (ref >60)
Glucose, Bld: 109 mg/dL — ABNORMAL HIGH (ref 70–99)
Potassium: 4.9 mmol/L (ref 3.5–5.1)
Sodium: 144 mmol/L (ref 135–145)

## 2020-11-26 LAB — CBC
HCT: 32.4 % — ABNORMAL LOW (ref 39.0–52.0)
Hemoglobin: 10.1 g/dL — ABNORMAL LOW (ref 13.0–17.0)
MCH: 30.9 pg (ref 26.0–34.0)
MCHC: 31.2 g/dL (ref 30.0–36.0)
MCV: 99.1 fL (ref 80.0–100.0)
Platelets: 170 10*3/uL (ref 150–400)
RBC: 3.27 MIL/uL — ABNORMAL LOW (ref 4.22–5.81)
RDW: 12.6 % (ref 11.5–15.5)
WBC: 7.3 10*3/uL (ref 4.0–10.5)
nRBC: 0 % (ref 0.0–0.2)

## 2020-11-26 MED ORDER — PREDNISONE 10 MG PO TABS
40.0000 mg | ORAL_TABLET | Freq: Every day | ORAL | Status: DC
  Administered 2020-11-27: 40 mg via ORAL
  Filled 2020-11-26: qty 4

## 2020-11-26 MED ORDER — ALBUTEROL SULFATE (2.5 MG/3ML) 0.083% IN NEBU: 3.0000 mL | INHALATION_SOLUTION | RESPIRATORY_TRACT | Status: DC | PRN

## 2020-11-26 NOTE — Progress Notes (Signed)
PROGRESS NOTE    Darin Hopkins  TLX:[id number] DOB: 27-Jul-1960 DOA: 11/25/2020 PCP: Martie Round, NP     Brief Narrative:    Darin Hopkins is a 60 y.o. male with medical history significant for hypertension, GERD, history of seizure, last seizure was in May 2022, CKD3, hyperlipidemia, COPD, history of CVA about 2.5 years ago, tobacco use, presents to the ED for chief concern of shortness of breath.    He reports that the shortness of breath started AM prior to hospitalization.  He states that the shortness of breath is worse with exertion.  States he ran out of his inhaler for 1 day.  He denies fever, sick contacts, cough, chest pain, abdominal pain, dysuria, syncope, loss of consciousness, dysuria, diarrhea.  He denies nausea and vomiting.   He states he tries to drink a lot of water and he does not know the quantity in which he drinks fluid.  He denies drinking soda, tea, EtOH.  He states specifically that his last EtOH drink was December 2021.   He reports improvement with BiPAP.    Assessment & Plan:   Principal Problem:   Acute hypoxemic respiratory failure (HCC) Active Problems:   Acute exacerbation of CHF (congestive heart failure) (HCC)   Essential hypertension   History of seizure   COPD (chronic obstructive pulmonary disease) (HCC)   Hyperlipidemia   GERD (gastroesophageal reflux disease)   Protein-calorie malnutrition, severe   COPD exacerbation (HCC)   Chronic kidney disease, stage III (moderate) (HCC)   AKI (acute kidney injury) (HCC)   # Acute hypoxemic respiratory failure # COPD with exacerbation Suspect 2/2 copd exacerbation. Treated for volume overload but does not appear volume overloaded, cxr clear. Weaned off bipap, now on 2 L which is below his baseline which he says is 5.  - continue scheduled donebs and prednisone - abx haven't been started and I will hold on starting given rapid improvement - plan to d/c on spiriva - transfer to floor,  possible d/c tomorrow   # Acute kidney injury Appears dry on exam. Cr 1.57 from baseline 1.1 - hold lasix today - trend   # Hypertension-initially mildly elevated - Resumed amlodipine 10 mg daily, carvedilol 25 mg twice daily, doxazosin 2 mg nightly  # Bradycardia Appears asymptomatic  # History of seizure With hx poor antiepileptic compliance -continue Keppra 750 mg p.o. twice daily, Ativan 1 mg IV every 2 hours as needed for seizure, Ativan 2 mg IV as needed once for breakthrough seizures, 1 dose ordered - Seizure precaution  # HFpEF Euvolemic today to dry. Out 3 L yesterday - holding lasix - cont carvedilol - hasn't started entresto which is on med list, don't see strong indication to start, also is on lisinopril at home   DVT prophylaxis: lovenox Code Status: full Family Communication: none @ bedside  Level of care: Stepdown Status is: Inpatient  Remains inpatient appropriate because:Inpatient level of care appropriate due to severity of illness  Dispo: The patient is from: Home              Anticipated d/c is to: Home              Patient currently is not medically stable to d/c.   Difficult to place patient No        Consultants:  none  Procedures: none  Antimicrobials:  none    Subjective: This morning breathing much improved, denies cough or fever  Objective: Vitals:   11/26/20 0400  11/26/20 0500 11/26/20 0600 11/26/20 0750  BP: 124/71 117/68 119/64   Pulse: (!) 48 (!) 47 (!) 52   Resp: 10 11 20    Temp:  98.2 F (36.8 C)    TempSrc:  Oral    SpO2: 100% 95% 100% 100%  Weight:      Height:        Intake/Output Summary (Last 24 hours) at 11/26/2020 0814 Last data filed at 11/26/2020 0600 Gross per 24 hour  Intake --  Output 2950 ml  Net -2950 ml   Filed Weights   11/25/20 1201 11/25/20 1530  Weight: 65.9 kg 57.6 kg    Examination:  General exam: Appears calm and comfortable  Respiratory system: few scattered wheezes otherwise  clear Cardiovascular system: S1 & S2 heard, RRR. No JVD, murmurs, rubs, gallops or clicks. No pedal edema. Gastrointestinal system: Abdomen is nondistended, soft and nontender. No organomegaly or masses felt. Normal bowel sounds heard. Central nervous system: Alert and oriented. No focal neurological deficits. Extremities: Symmetric 5 x 5 power. Skin: No rashes, lesions or ulcers Psychiatry: Judgement and insight appear normal. Mood & affect appropriate.     Data Reviewed: I have personally reviewed following labs and imaging studies  CBC: Recent Labs  Lab 11/25/20 1204 11/26/20 0504  WBC 8.0 7.3  NEUTROABS 4.4  --   HGB 10.2* 10.1*  HCT 34.2* 32.4*  MCV 100.0 99.1  PLT 183 170   Basic Metabolic Panel: Recent Labs  Lab 11/25/20 1204 11/26/20 0504  NA 147* 144  K 4.4 4.9  CL 102 99  CO2 38* 35*  GLUCOSE 117* 109*  BUN 36* 41*  CREATININE 1.59* 1.57*  CALCIUM 9.3 9.0   GFR: Estimated Creatinine Clearance: 40.8 mL/min (A) (by C-G formula based on SCr of 1.57 mg/dL (H)). Liver Function Tests: Recent Labs  Lab 11/25/20 1204  AST 14*  ALT 9  ALKPHOS 52  BILITOT 0.5  PROT 7.7  ALBUMIN 3.9   No results for input(s): LIPASE, AMYLASE in the last 168 hours. No results for input(s): AMMONIA in the last 168 hours. Coagulation Profile: No results for input(s): INR, PROTIME in the last 168 hours. Cardiac Enzymes: No results for input(s): CKTOTAL, CKMB, CKMBINDEX, TROPONINI in the last 168 hours. BNP (last 3 results) No results for input(s): PROBNP in the last 8760 hours. HbA1C: No results for input(s): HGBA1C in the last 72 hours. CBG: Recent Labs  Lab 11/25/20 1531  GLUCAP 109*   Lipid Profile: No results for input(s): CHOL, HDL, LDLCALC, TRIG, CHOLHDL, LDLDIRECT in the last 72 hours. Thyroid Function Tests: Recent Labs    11/25/20 1204  TSH 1.018   Anemia Panel: No results for input(s): VITAMINB12, FOLATE, FERRITIN, TIBC, IRON, RETICCTPCT in the last 72  hours. Urine analysis:    Component Value Date/Time   COLORURINE STRAW (A) 11/25/2020 1204   APPEARANCEUR CLEAR (A) 11/25/2020 1204   APPEARANCEUR Clear 11/02/2013 0205   LABSPEC 1.011 11/25/2020 1204   LABSPEC 1.005 11/02/2013 0205   PHURINE 5.0 11/25/2020 1204   GLUCOSEU NEGATIVE 11/25/2020 1204   GLUCOSEU Negative 11/02/2013 0205   HGBUR NEGATIVE 11/25/2020 1204   BILIRUBINUR NEGATIVE 11/25/2020 1204   BILIRUBINUR Negative 11/02/2013 0205   KETONESUR NEGATIVE 11/25/2020 1204   PROTEINUR 100 (A) 11/25/2020 1204   NITRITE NEGATIVE 11/25/2020 1204   LEUKOCYTESUR NEGATIVE 11/25/2020 1204   LEUKOCYTESUR Negative 11/02/2013 0205   Sepsis Labs: @LABRCNTIP (procalcitonin:4,lacticidven:4)  ) Recent Results (from the past 240 hour(s))  Resp Panel by RT-PCR (  Flu A&B, Covid) Nasopharyngeal Swab     Status: None   Collection Time: 11/25/20 12:04 PM   Specimen: Nasopharyngeal Swab; Nasopharyngeal(NP) swabs in vial transport medium  Result Value Ref Range Status   SARS Coronavirus 2 by RT PCR NEGATIVE NEGATIVE Final    Comment: (NOTE) SARS-CoV-2 target nucleic acids are NOT DETECTED.  The SARS-CoV-2 RNA is generally detectable in upper respiratory specimens during the acute phase of infection. The lowest concentration of SARS-CoV-2 viral copies this assay can detect is 138 copies/mL. A negative result does not preclude SARS-Cov-2 infection and should not be used as the sole basis for treatment or other patient management decisions. A negative result may occur with  improper specimen collection/handling, submission of specimen other than nasopharyngeal swab, presence of viral mutation(s) within the areas targeted by this assay, and inadequate number of viral copies(<138 copies/mL). A negative result must be combined with clinical observations, patient history, and epidemiological information. The expected result is Negative.  Fact Sheet for Patients:   BloggerCourse.com  Fact Sheet for Healthcare Providers:  SeriousBroker.it  This test is no t yet approved or cleared by the [place] FDA and  has been authorized for detection and/or diagnosis of SARS-CoV-2 by FDA under an Emergency Use Authorization (EUA). This EUA will remain  in effect (meaning this test can be used) for the duration of the COVID-19 declaration under Section 564(b)(1) of the Act, 21 U.S.C.section 360bbb-3(b)(1), unless the authorization is terminated  or revoked sooner.       Influenza A by PCR NEGATIVE NEGATIVE Final   Influenza B by PCR NEGATIVE NEGATIVE Final    Comment: (NOTE) The Xpert Xpress SARS-CoV-2/FLU/RSV plus assay is intended as an aid in the diagnosis of influenza from Nasopharyngeal swab specimens and should not be used as a sole basis for treatment. Nasal washings and aspirates are unacceptable for Xpert Xpress SARS-CoV-2/FLU/RSV testing.  Fact Sheet for Patients: BloggerCourse.com  Fact Sheet for Healthcare Providers: SeriousBroker.it  This test is not yet approved or cleared by the [place] FDA and has been authorized for detection and/or diagnosis of SARS-CoV-2 by FDA under an Emergency Use Authorization (EUA). This EUA will remain in effect (meaning this test can be used) for the duration of the COVID-19 declaration under Section 564(b)(1) of the Act, 21 U.S.C. section 360bbb-3(b)(1), unless the authorization is terminated or revoked.  Performed at [facility], [address] [place], [place] [address]   Blood culture (single)     Status: None (Preliminary result)   Collection Time: 11/25/20 12:05 PM   Specimen: BLOOD  Result Value Ref Range Status   Specimen Description BLOOD RIGHT [facility]  Final   Special Requests   Final    BOTTLES DRAWN AEROBIC AND ANAEROBIC Blood Culture adequate volume   Culture   Final     NO GROWTH < 24 HOURS Performed at [facility], [address] [place], [place] [address]    Report Status PENDING  Incomplete  MRSA Next Gen by PCR, Nasal     Status: None   Collection Time: 11/25/20  3:39 PM   Specimen: Nasal Mucosa; Nasal Swab  Result Value Ref Range Status   MRSA by PCR Next Gen NOT DETECTED NOT DETECTED Final    Comment: (NOTE) The GeneXpert MRSA Assay (FDA approved for NASAL specimens only), is one component of a comprehensive MRSA colonization surveillance program. It is not intended to diagnose MRSA infection nor to guide or monitor treatment for MRSA infections. Test  performance is not FDA approved in patients less than 60 years old. Performed at [facility], [address] [place], [place] [address]          Radiology Studies: DG Chest Portable 1 View  Result Date: 11/25/2020 CLINICAL DATA:  Pt is very SOB. Hx of COPD, HTN, and CHF. EXAM: PORTABLE CHEST - 1 VIEW COMPARISON:  10/02/2020 FINDINGS: Lungs are clear. Heart size and mediastinal contours are within normal limits. No effusion.  No pneumothorax. Visualized bones unremarkable. IMPRESSION: No acute cardiopulmonary disease. Electronically Signed   By: Corlis Leak  Hassell M.D.   On: 11/25/2020 12:22        Scheduled Meds:  amLODipine  10 mg Oral QPM   aspirin EC  81 mg Oral q AM   atorvastatin  20 mg Oral QPM   carvedilol  25 mg Oral BID WC   chlorhexidine  15 mL Mouth Rinse BID   Chlorhexidine Gluconate Cloth  6 each Topical Daily   doxazosin  2 mg Oral QPM   enoxaparin (LOVENOX) injection  40 mg Subcutaneous QHS   folic acid  1 mg Oral Daily   furosemide  20 mg Intravenous BID   ipratropium-albuterol  3 mL Nebulization TID   levETIRAcetam  750 mg Oral BID   mouth rinse  15 mL Mouth Rinse q12n4p   methylPREDNISolone (SOLU-MEDROL) injection  40 mg Intravenous BID   multivitamin with minerals  2 tablet Oral Daily   pantoprazole  40 mg Oral Daily   thiamine  100 mg Oral  Daily   Continuous Infusions:   LOS: 1 day    Time spent: 35 min    Silvano BilisNoah B Ardine Iacovelli, MD [facility]   If 7PM-7AM, please contact night-coverage [email or web address] Password [email or web address] 11/26/2020, 8:14 AM

## 2020-11-27 DIAGNOSIS — J9601 Acute respiratory failure with hypoxia: Secondary | ICD-10-CM | POA: Diagnosis not present

## 2020-11-27 LAB — BASIC METABOLIC PANEL
Anion gap: 6 (ref 5–15)
BUN: 46 mg/dL — ABNORMAL HIGH (ref 6–20)
CO2: 38 mmol/L — ABNORMAL HIGH (ref 22–32)
Calcium: 8.9 mg/dL (ref 8.9–10.3)
Chloride: 99 mmol/L (ref 98–111)
Creatinine, Ser: 1.47 mg/dL — ABNORMAL HIGH (ref 0.61–1.24)
GFR, Estimated: 54 mL/min — ABNORMAL LOW (ref >60)
Glucose, Bld: 85 mg/dL (ref 70–99)
Potassium: 4.3 mmol/L (ref 3.5–5.1)
Sodium: 143 mmol/L (ref 135–145)

## 2020-11-27 MED ORDER — CARVEDILOL 12.5 MG PO TABS
25.0000 mg | ORAL_TABLET | Freq: Two times a day (BID) | ORAL | 1 refills | Status: DC
Start: 2020-11-27 — End: 2021-01-16

## 2020-11-27 MED ORDER — PREDNISONE 20 MG PO TABS: 40.0000 mg | ORAL_TABLET | Freq: Every day | ORAL | 0 refills | Status: DC

## 2020-11-27 MED ORDER — SPIRIVA HANDIHALER 18 MCG IN CAPS: 1.0000 | ORAL_CAPSULE | Freq: Every day | RESPIRATORY_TRACT | 12 refills | Status: DC

## 2020-11-27 MED ORDER — IPRATROPIUM-ALBUTEROL 0.5-2.5 (3) MG/3ML IN SOLN: 3.0000 mL | RESPIRATORY_TRACT | Status: DC | PRN

## 2020-11-27 MED ORDER — SPIRIVA HANDIHALER 18 MCG IN CAPS: 18.0000 ug | ORAL_CAPSULE | Freq: Every day | RESPIRATORY_TRACT | 2 refills | Status: DC

## 2020-11-27 NOTE — TOC Initial Note (Signed)
Transition of Care [facility]) - Initial/Assessment Note    Patient Details  Name: Darin Hopkins MRN: [id number] Date of Birth: 1960-09-10  Transition of [name]) CM/SW Contact:    Marina Goodell Phone Number: [phone] 11/27/2020, 11:33 AM  Clinical Narrative:                  Patient presented to [facility] due to SOB.  Patient will discharge home, EMS transporting.  CSW updated patient's sister [name] [phone].   Expected Discharge Plan: Home w Home Health Services Barriers to Discharge: No Barriers Identified   Patient Goals and CMS Choice Patient states their goals for this hospitalization and ongoing recovery are:: To go home   Choice offered to / list presented to : Sibling  Expected Discharge Plan and Services Expected Discharge Plan: Home w Home Health Services In-house Referral: Clinical Social Work   Post Acute Care Choice: Durable Medical Equipment (2L O2 chronic at home)   Expected Discharge Date: 11/27/20                                    Prior Living Arrangements/Services   Lives with:: Siblings Ovidio Kin (Sister)   [phone] (Home Phone)) Patient language and need for interpreter reviewed:: Yes Do you feel safe going back to the place where you live?: Yes      Need for Family Participation in Patient Care: Yes (Comment) Care giver support system in place?: Yes (comment) Current home services: DME Criminal Activity/Legal Involvement Pertinent to Current Situation/Hospitalization: No - Comment as needed  Activities of Daily Living Home Assistive Devices/Equipment: Dan Humphreys (specify type) ADL Screening (condition at time of admission) Patient's cognitive ability adequate to safely complete daily activities?: Yes Is the patient deaf or have difficulty hearing?: No Does the patient have difficulty seeing, even when wearing glasses/contacts?: No Does the patient have difficulty concentrating, remembering, or making decisions?:  No Patient able to express need for assistance with ADLs?: No Does the patient have difficulty dressing or bathing?: No Independently performs ADLs?: Yes (appropriate for developmental age) Does the patient have difficulty walking or climbing stairs?: No Weakness of Legs: None Weakness of Arms/Hands: None  Permission Sought/Granted Permission sought to share information with : Facility Medical sales representative Permission granted to share information with : Yes, Verbal Permission Granted  Share Information with NAME: Ovidio Kin (Sister)   [phone] [facility] Phone)           Emotional Assessment Appearance:: Appears younger than stated age Attitude/Demeanor/Rapport: Engaged Affect (typically observed): Quiet, Stable Orientation: : Oriented to Situation, Oriented to Place, Oriented to  Time, Oriented to Self Alcohol / Substance Use: Not Applicable Psych Involvement: No (comment)  Admission diagnosis:  COPD exacerbation (HCC) [J44.1] Acute respiratory failure with hypoxia and hypercapnia (HCC) [J96.01, J96.02] Laceration of left ear, initial encounter [S01.312A] Acute hypoxemic respiratory failure (HCC) [J96.01] Diastolic congestive heart failure, unspecified HF chronicity (HCC) [I50.30] Patient Active Problem List   Diagnosis Date Noted   Acute hypoxemic respiratory failure (HCC) 11/25/2020   AKI (acute kidney injury) (HCC) 11/25/2020   Status epilepticus (HCC) 09/09/2020   COPD exacerbation (HCC) 08/27/2020   Protein-calorie malnutrition, severe 08/21/2020   Acute hypercapnic respiratory failure (HCC)    Acute decompensated heart failure (HCC) 08/18/2020   Acute exacerbation of CHF (congestive heart failure) (HCC) 08/17/2020   Essential hypertension 08/17/2020   History of seizure 08/17/2020   COPD (chronic  obstructive pulmonary disease) (HCC) 08/17/2020   Hyperlipidemia 08/17/2020   GERD (gastroesophageal reflux disease) 08/17/2020   Alcohol abuse 08/17/2020   Chronic  kidney disease, stage III (moderate) (HCC) 10/24/2012   PCP:  Martie Round, NP Pharmacy:   [facility] - [place], [place] - [address] [place] [address] Phone: [phone] Fax: [phone]     Social Determinants of Health (SDOH) Interventions    Readmission Risk Interventions No flowsheet data found.

## 2020-11-27 NOTE — Discharge Summary (Signed)
Darin Hopkins:[id number] DOB: 1961-02-12 DOA: 11/25/2020  PCP: Martie Round, NP  Admit date: 11/25/2020 Discharge date: 11/27/2020  Time spent: 35 minutes  Recommendations for Outpatient Follow-up:  PCP f/u 1 week Will need BMP to check kidney function     Discharge Diagnoses:  Principal Problem:   Acute hypoxemic respiratory failure (HCC) Active Problems:   Acute exacerbation of CHF (congestive heart failure) (HCC)   Essential hypertension   History of seizure   COPD (chronic obstructive pulmonary disease) (HCC)   Hyperlipidemia   GERD (gastroesophageal reflux disease)   Protein-calorie malnutrition, severe   COPD exacerbation (HCC)   Chronic kidney disease, stage III (moderate) (HCC)   AKI (acute kidney injury) (HCC)   Discharge Condition: fair  Diet recommendation: heart healthy  Filed Weights   11/25/20 1201 11/25/20 1530  Weight: 65.9 kg 57.6 kg    History of present illness:  Darin Hopkins is a 60 y.o. male with medical history significant for hypertension, GERD, history of seizure, last seizure was in May 2022, CKD3, hyperlipidemia, COPD, history of CVA about 2.5 years ago, tobacco use, presents to the ED for chief concern of shortness of breath.    He reports that the shortness of breath started AM prior to hospitalization.  He states that the shortness of breath is worse with exertion.  States he ran out of his inhaler for 1 day.  He denies fever, sick contacts, cough, chest pain, abdominal pain, dysuria, syncope, loss of consciousness, dysuria, diarrhea.  He denies nausea and vomiting.   He states he tries to drink a lot of water and he does not know the quantity in which he drinks fluid.  He denies drinking soda, tea, EtOH.  He states specifically that his last EtOH drink was December 2021.   He reports improvement with BiPAP.  Hospital Course:  Patient presented with acute on chronic hypoxic respiratory failure. Treated briefly with bipap, then  transitioned to home O2. Initially treated for CHF exacerbation with diuresis, but overall picture more suggestive of copd exacerbation. Treated with prednisone which he will continue. Patient also had AKI of 1.57 from baseline in low 1s, improved to 1.47 on day of discharge, will need check of kidney function at f/u. Noted to be bradycardic and asymptomatic during his hospital stay, dose of coreg was decreased. Has a hx of seizures, none this hospitalization. Will need close pcp f/u.  Procedures: none   Consultations: none  Discharge Exam: Vitals:   11/27/20 0600 11/27/20 0700  BP: 111/76 130/75  Pulse:    Resp: 13 12  Temp:    SpO2:      General exam: Appears calm and comfortable Respiratory system: few scattered wheezes otherwise clear Cardiovascular system: S1 & S2 heard, RRR. No JVD, murmurs, rubs, gallops or clicks. No pedal edema. Gastrointestinal system: Abdomen is nondistended, soft and nontender. No organomegaly or masses felt. Normal bowel sounds heard. Central nervous system: Alert and oriented. No focal neurological deficits. Extremities: Symmetric 5 x 5 power. Skin: No rashes, lesions or ulcers Psychiatry: Judgement and insight appear normal. Mood & affect appropriate.  Discharge Instructions   Discharge Instructions     Diet - low sodium heart healthy   Complete by: As directed    Increase activity slowly   Complete by: As directed    No wound care   Complete by: As directed       Allergies as of 11/27/2020   No Known Allergies      Medication  List     STOP taking these medications    sacubitril-valsartan 24-26 MG Commonly known as: ENTRESTO       TAKE these medications    amLODipine 10 MG tablet Commonly known as: NORVASC Take 10 mg by mouth every evening.   aspirin 81 MG EC tablet Take 81 mg by mouth in the morning.   atorvastatin 20 MG tablet Commonly known as: LIPITOR Take 20 mg by mouth every evening.   carvedilol 12.5 MG  tablet Commonly known as: COREG Take 2 tablets (25 mg total) by mouth 2 (two) times daily with a meal. What changed: medication strength   cholecalciferol 25 MCG (1000 UNIT) tablet Commonly known as: VITAMIN D Take 2,000 Units by mouth daily.   doxazosin 2 MG tablet Commonly known as: CARDURA Take 2 mg by mouth every evening.   folic acid 1 MG tablet Commonly known as: FOLVITE Take 1 mg by mouth daily.   furosemide 40 MG tablet Commonly known as: LASIX Take 40 mg by mouth daily.   levETIRAcetam 750 MG tablet Commonly known as: KEPPRA Take 1 tablet (750 mg total) by mouth 2 (two) times daily.   lisinopril 5 MG tablet Commonly known as: ZESTRIL Take 5 mg by mouth daily.   Multivitamin Adult Tabs Take 2 tablets by mouth daily.   omeprazole 20 MG capsule Commonly known as: PRILOSEC Take 20 mg by mouth daily before breakfast.   potassium chloride 10 MEQ tablet Commonly known as: KLOR-CON Take 10 mEq by mouth daily.   predniSONE 20 MG tablet Commonly known as: DELTASONE Take 2 tablets (40 mg total) by mouth daily with breakfast.   ProAir HFA 108 (90 Base) MCG/ACT inhaler Generic drug: albuterol Inhale 2 puffs into the lungs every 6 (six) hours as needed for shortness of breath or wheezing.   Spiriva HandiHaler 18 MCG inhalation capsule Generic drug: tiotropium Place 1 capsule (18 mcg total) into inhaler and inhale daily. What changed: You were already taking a medication with the same name, and this prescription was added. Make sure you understand how and when to take each.   Spiriva HandiHaler 18 MCG inhalation capsule Generic drug: tiotropium Place 1 capsule (18 mcg total) into inhaler and inhale daily. What changed: how much to take   thiamine 100 MG tablet Take 100 mg by mouth daily.       ASK your doctor about these medications    Vitamin D-3 25 MCG (1000 UT) Caps Take 1,000 Units by mouth in the morning.       No Known Allergies  Follow-up  Information     Martie RoundSpencer, Nicole, NP Follow up.   Specialty: Nurse Practitioner Contact information: [address] [place] [address] [phone]         Delma FreezeHackney, Tina A, FNP Follow up.   Specialty: Family Medicine Contact information: [address] [place] [phone]                  The results of significant diagnostics from this hospitalization (including imaging, microbiology, ancillary and laboratory) are listed below for reference.    Significant Diagnostic Studies: DG Chest Portable 1 View  Result Date: 11/25/2020 CLINICAL DATA:  Pt is very SOB. Hx of COPD, HTN, and CHF. EXAM: PORTABLE CHEST - 1 VIEW COMPARISON:  10/02/2020 FINDINGS: Lungs are clear. Heart size and mediastinal contours are within normal limits. No effusion.  No pneumothorax. Visualized bones unremarkable. IMPRESSION: No acute cardiopulmonary disease. Electronically Signed  By: Corlis Leak M.D.   On: 11/25/2020 12:22    Microbiology: Recent Results (from the past 240 hour(s))  Resp Panel by RT-PCR (Flu A&B, Covid) Nasopharyngeal Swab     Status: None   Collection Time: 11/25/20 12:04 PM   Specimen: Nasopharyngeal Swab; Nasopharyngeal(NP) swabs in vial transport medium  Result Value Ref Range Status   SARS Coronavirus 2 by RT PCR NEGATIVE NEGATIVE Final    Comment: (NOTE) SARS-CoV-2 target nucleic acids are NOT DETECTED.  The SARS-CoV-2 RNA is generally detectable in upper respiratory specimens during the acute phase of infection. The lowest concentration of SARS-CoV-2 viral copies this assay can detect is 138 copies/mL. A negative result does not preclude SARS-Cov-2 infection and should not be used as the sole basis for treatment or other patient management decisions. A negative result may occur with  improper specimen collection/handling, submission of specimen other than nasopharyngeal swab, presence of viral mutation(s) within the areas  targeted by this assay, and inadequate number of viral copies(<138 copies/mL). A negative result must be combined with clinical observations, patient history, and epidemiological information. The expected result is Negative.  Fact Sheet for Patients:  BloggerCourse.com  Fact Sheet for Healthcare Providers:  SeriousBroker.it  This test is no t yet approved or cleared by the [place] FDA and  has been authorized for detection and/or diagnosis of SARS-CoV-2 by FDA under an Emergency Use Authorization (EUA). This EUA will remain  in effect (meaning this test can be used) for the duration of the COVID-19 declaration under Section 564(b)(1) of the Act, 21 U.S.C.section 360bbb-3(b)(1), unless the authorization is terminated  or revoked sooner.       Influenza A by PCR NEGATIVE NEGATIVE Final   Influenza B by PCR NEGATIVE NEGATIVE Final    Comment: (NOTE) The Xpert Xpress SARS-CoV-2/FLU/RSV plus assay is intended as an aid in the diagnosis of influenza from Nasopharyngeal swab specimens and should not be used as a sole basis for treatment. Nasal washings and aspirates are unacceptable for Xpert Xpress SARS-CoV-2/FLU/RSV testing.  Fact Sheet for Patients: BloggerCourse.com  Fact Sheet for Healthcare Providers: SeriousBroker.it  This test is not yet approved or cleared by the [place] FDA and has been authorized for detection and/or diagnosis of SARS-CoV-2 by FDA under an Emergency Use Authorization (EUA). This EUA will remain in effect (meaning this test can be used) for the duration of the COVID-19 declaration under Section 564(b)(1) of the Act, 21 U.S.C. section 360bbb-3(b)(1), unless the authorization is terminated or revoked.  Performed at [facility], [address] [place], [place] [address]   Blood culture (single)     Status: None (Preliminary result)    Collection Time: 11/25/20 12:05 PM   Specimen: BLOOD  Result Value Ref Range Status   Specimen Description BLOOD RIGHT [facility]  Final   Special Requests   Final    BOTTLES DRAWN AEROBIC AND ANAEROBIC Blood Culture adequate volume   Culture   Final    NO GROWTH < 24 HOURS Performed at [facility], [address], [place], [place] [address]    Report Status PENDING  Incomplete  MRSA Next Gen by PCR, Nasal     Status: None   Collection Time: 11/25/20  3:39 PM   Specimen: Nasal Mucosa; Nasal Swab  Result Value Ref Range Status   MRSA by PCR Next Gen NOT DETECTED NOT DETECTED Final    Comment: (NOTE) The GeneXpert MRSA Assay (FDA approved for NASAL specimens only), is one  component of a comprehensive MRSA colonization surveillance program. It is not intended to diagnose MRSA infection nor to guide or monitor treatment for MRSA infections. Test performance is not FDA approved in patients less than 19 years old. Performed at [facility], [address] [place], [place] [address]      Labs: Basic Metabolic Panel: Recent Labs  Lab 11/25/20 1204 11/26/20 0504 11/27/20 0441  NA 147* 144 143  K 4.4 4.9 4.3  CL 102 99 99  CO2 38* 35* 38*  GLUCOSE 117* 109* 85  BUN 36* 41* 46*  CREATININE 1.59* 1.57* 1.47*  CALCIUM 9.3 9.0 8.9   Liver Function Tests: Recent Labs  Lab 11/25/20 1204  AST 14*  ALT 9  ALKPHOS 52  BILITOT 0.5  PROT 7.7  ALBUMIN 3.9   No results for input(s): LIPASE, AMYLASE in the last 168 hours. No results for input(s): AMMONIA in the last 168 hours. CBC: Recent Labs  Lab 11/25/20 1204 11/26/20 0504  WBC 8.0 7.3  NEUTROABS 4.4  --   HGB 10.2* 10.1*  HCT 34.2* 32.4*  MCV 100.0 99.1  PLT 183 170   Cardiac Enzymes: No results for input(s): CKTOTAL, CKMB, CKMBINDEX, TROPONINI in the last 168 hours. BNP: BNP (last 3 results) Recent Labs    08/27/20 1304 10/02/20 1037 11/25/20 1204  BNP 285.0* 470.7* 575.8*    ProBNP (last  3 results) No results for input(s): PROBNP in the last 8760 hours.  CBG: Recent Labs  Lab 11/25/20 1531  GLUCAP 109*       Signed:  Silvano Bilis MD.  [facility] 11/27/2020, 7:58 AM

## 2020-11-28 LAB — BLOOD GAS, VENOUS
Acid-Base Excess: 12.3 mmol/L — ABNORMAL HIGH (ref 0.0–2.0)
Bicarbonate: 42.2 mmol/L — ABNORMAL HIGH (ref 20.0–28.0)
O2 Saturation: 40.6 %
Patient temperature: 37
pCO2, Ven: 92 mmHg (ref 44.0–60.0)
pH, Ven: 7.27 (ref 7.250–7.430)

## 2020-12-01 LAB — CULTURE, BLOOD (SINGLE)
Culture: NO GROWTH
Special Requests: ADEQUATE

## 2020-12-10 NOTE — Progress Notes (Signed)
Patient ID: Darin Hopkins, male    DOB: [birth year], 60 y.o.   MRN: [id number]  HPI  [name] is a 60 y/o male with a history of HTN, COPD, seizures, previous tobacco/ alcohol use and chronic heart failure.   Echo report from 08/18/20 reviewed and showed an EF of 60-65% along with mild LVH and moderate AS.  Admitted 11/25/20 due to worsening shortness of breath. Drinks a lot of fluids and is unsure of how much. Placed on bipap with improvement of symptoms & then weaned off to nasal cannula. Coreg decreased due to bradycardia. Discharged after 2 days. Was in the ED 10/02/20 due to peripheral edema. Treated with diuretics and he was released. Admitted 09/09/20 due to seizures. Neurology consult obtained. Intubated due to inability to protect his airway. Head CT negative for acute stroke. Keppra increased. Extubated successfully and placed on home oxygen of 3L. Discharged after 8 days.   He presents today for a follow-up visit with a chief complaint of minimal shortness of breath upon moderate exertion. He describes this as chronic in nature having been present for several years. He has associated fatigue & pedal edema (improving) along with this. He denies any difficulty sleeping (although has days/ nights mixed up), dizziness, abdominal distention, palpitations, chest pain, cough or weight gain.   Sister that is present says patient is awake all night and then sleeps during the day which is also keeping her awake at night. He arrived to the office on his oxygen tank which was completely empty. Placed patient on our tank at 4L and pulse ox quickly rose and shortness of breath improved.   Sister voices frustration with current oxygen company & doesn't feel like they've explained things well to her. She does say that the oxygen company is coming to the house later today. She can't recall the name of the company.   Past Medical History:  Diagnosis Date   CHF (congestive heart failure) (HCC)    COPD  (chronic obstructive pulmonary disease) (HCC)    Hypertension    Seizures (HCC)    No past surgical history on file. No family history on file. Social History   Tobacco Use   Smoking status: Former    Packs/day: 2.00    Types: Cigarettes   Smokeless tobacco: Never  Substance Use Topics   Alcohol use: Yes    Alcohol/week: 3.0 standard drinks    Types: 3 Cans of beer per week    Comment: daily   No Known Allergies  Prior to Admission medications   Medication Sig Start Date End Date Taking? Authorizing Provider  amLODipine (NORVASC) 10 MG tablet Take 10 mg by mouth every evening. 07/28/20  Yes [provider]  aspirin 81 MG EC tablet Take 81 mg by mouth in the morning.   Yes [provider]  atorvastatin (LIPITOR) 20 MG tablet Take 20 mg by mouth every evening. 07/28/20  Yes [provider]  carvedilol (COREG) 12.5 MG tablet Take 2 tablets (25 mg total) by mouth 2 (two) times daily with a meal. 11/27/20  Yes Wouk, Wilfred Curtis, MD  cholecalciferol (VITAMIN D) 25 MCG (1000 UNIT) tablet Take 2,000 Units by mouth daily. 11/17/20  Yes [provider]  doxazosin (CARDURA) 2 MG tablet Take 2 mg by mouth every evening. 07/28/20  Yes [provider]  folic acid (FOLVITE) 1 MG tablet Take 1 mg by mouth daily. 04/17/20  Yes [provider]  furosemide (LASIX) 40 MG tablet Take  40 mg by mouth daily. 11/17/20  Yes [provider]  levETIRAcetam (KEPPRA) 750 MG tablet Take 1 tablet (750 mg total) by mouth 2 (two) times daily. 09/12/20  Yes Pokhrel, Laxman, MD  lisinopril (ZESTRIL) 5 MG tablet Take 5 mg by mouth daily. 11/17/20  Yes [provider]  Multiple Vitamin (MULTIVITAMIN ADULT) TABS Take 2 tablets by mouth daily.   Yes [provider]  omeprazole (PRILOSEC) 20 MG capsule Take 20 mg by mouth daily before breakfast. 07/28/20  Yes [provider]  potassium chloride (KLOR-CON) 10 MEQ tablet Take 10 mEq by mouth  daily. 11/17/20  Yes [provider]  predniSONE (DELTASONE) 20 MG tablet Take 2 tablets (40 mg total) by mouth daily with breakfast. 11/27/20  Yes Wouk, Wilfred Curtis, MD  [facility] HFA 108 [phone] Base) MCG/ACT inhaler Inhale 2 puffs into the lungs every 6 (six) hours as needed for shortness of breath or wheezing. 07/28/20  Yes [provider]  SPIRIVA HANDIHALER 18 MCG inhalation capsule Place 1 capsule (18 mcg total) into inhaler and inhale daily. 11/27/20  Yes Wouk, Wilfred Curtis, MD  thiamine 100 MG tablet Take 100 mg by mouth daily. 11/17/20  Yes [provider]  tiotropium (SPIRIVA HANDIHALER) 18 MCG inhalation capsule Place 1 capsule (18 mcg total) into inhaler and inhale daily. 11/27/20 11/27/21 Yes Wouk, Wilfred Curtis, MD  Cholecalciferol (VITAMIN D-3) 25 MCG (1000 UT) CAPS Take 1,000 Units by mouth in the morning. Patient not taking: No sig reported    [provider]    Review of Systems  Constitutional:  Positive for fatigue. Negative for appetite change.  HENT:  Negative for congestion, postnasal drip and sore throat.   Eyes: Negative.   Respiratory:  Positive for shortness of breath. Negative for cough and chest tightness.   Cardiovascular:  Positive for leg swelling (improving). Negative for chest pain and palpitations.  Gastrointestinal:  Negative for abdominal distention and abdominal pain.  Endocrine: Negative.   Genitourinary: Negative.  Negative for scrotal swelling.  Musculoskeletal:  Negative for back pain and neck pain.  Skin: Negative.   Allergic/Immunologic: Negative.   Neurological:  Negative for dizziness and light-headedness.  Hematological:  Negative for adenopathy. Does not bruise/bleed easily.  Psychiatric/Behavioral:  Negative for dysphoric mood and sleep disturbance (sleeping more during the day;sleeping on 2 pillows with oxygen). The patient is not nervous/anxious.    Vitals:   12/11/20 0908  BP: 100/79  Pulse: (!) 58  Resp: 18   SpO2: 98%  Weight: 126 lb 4 oz (57.3 kg)  Height: 5\' 7"  (1.702 m)   Wt Readings from Last 3 Encounters:  12/11/20 126 lb 4 oz (57.3 kg)  11/25/20 126 lb 15.8 oz (57.6 kg)  11/14/20 130 lb 2 oz (59 kg)   Lab Results  Component Value Date   CREATININE 1.47 (H) 11/27/2020   CREATININE 1.57 (H) 11/26/2020   CREATININE 1.59 (H) 11/25/2020    Physical Exam Vitals and nursing note reviewed. Exam conducted with a chaperone present (sister).  Constitutional:      Appearance: Normal appearance.  HENT:     Head: Normocephalic and atraumatic.  Cardiovascular:     Rate and Rhythm: Regular rhythm. Bradycardia present.  Pulmonary:     Effort: Pulmonary effort is normal.     Breath sounds: Wheezing (expiratory in upper lobes) present. No rales.  Abdominal:     General: There is no distension.     Palpations: Abdomen is soft.  Tenderness: There is no abdominal tenderness.  Musculoskeletal:        General: No tenderness.     Cervical back: Normal range of motion and neck supple.     Right lower leg: Edema (trace pitting) present.     Left lower leg: Edema (trace pitting) present.  Skin:    General: Skin is warm and dry.  Neurological:     Mental Status: He is alert and oriented to person, place, and time. Mental status is at baseline.  Psychiatric:        Mood and Affect: Mood normal.        Behavior: Behavior normal.   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)- - NYHA class II - euvolemic today - weighing daily and said his weight has declined some; reminded to call for an overnight weight gain of > 2 pounds or a weekly weight gain of > 5 pounds - weight down 4 pounds  from last visit here 1 month ago - rarely adding salt and his sister has been reading food labels for sodium content - sister in unsure if entresto got started or not and she forgot his medication; she will check once she returns home and call us  back if it got started - needs to get established with cardiology but will defer for now - consider adding jardiance in the future once I know for sure what patient is taking - discussed paramedicine program with patient and they are interested; referral placed - she admits that it's difficult "keeping up with everything" - BNP 10/02/20 was 470.7  2: HTN- - BP looks good although on the low side (100/79); if he's currently not on entresto, BP may not tolerate it - follows with [facility] and he has an upcoming appointment with them - BMP 10/02/20 reviewed and showed sodium 142, potassium 4.3, creatinine 1.03 and GFR >60  3: COPD- - wearing oxygen at 4L around the clock although he arrived with a completely empty tank - they would like to get the smaller portable oxygen tanks as well as possibly switch oxygen companys - have made pulmonology referral to [facility] since they are in the same office making it simpler for the patient; this was scheduled for 01/16/21 - placed on 4L while in the office and since his oxygen tank is empty, patient says they are going straight home from here & get on his oxygen - says that he hasn't smoked anything for several months  4: Lymphedema- - stage 2 - not elevating his legs and he was encouraged to elevate them when sitting for long periods of time - doing some walking - not wearing compression socks but edema has improved - consider compression boots if edema worsens   Patient did not bring his medications nor a list. Each medication was verbally reviewed with the patient and he was encouraged to bring the bottles to every visit to confirm accuracy of list.   Return in 1 month or sooner for any questions/problems before then.

## 2020-12-11 ENCOUNTER — Ambulatory Visit: Payer: Medicaid Other | Attending: Family | Admitting: Family

## 2020-12-11 ENCOUNTER — Other Ambulatory Visit: Payer: Self-pay

## 2020-12-11 ENCOUNTER — Encounter: Payer: Self-pay | Admitting: Family

## 2020-12-11 VITALS — BP 100/79 | HR 58 | Resp 18 | Ht 67.0 in | Wt 126.2 lb

## 2020-12-11 DIAGNOSIS — J431 Panlobular emphysema: Secondary | ICD-10-CM

## 2020-12-11 DIAGNOSIS — R42 Dizziness and giddiness: Secondary | ICD-10-CM | POA: Diagnosis not present

## 2020-12-11 DIAGNOSIS — I890 Lymphedema, not elsewhere classified: Secondary | ICD-10-CM

## 2020-12-11 DIAGNOSIS — J449 Chronic obstructive pulmonary disease, unspecified: Secondary | ICD-10-CM | POA: Insufficient documentation

## 2020-12-11 DIAGNOSIS — Z7952 Long term (current) use of systemic steroids: Secondary | ICD-10-CM | POA: Diagnosis not present

## 2020-12-11 DIAGNOSIS — Z87891 Personal history of nicotine dependence: Secondary | ICD-10-CM | POA: Insufficient documentation

## 2020-12-11 DIAGNOSIS — R001 Bradycardia, unspecified: Secondary | ICD-10-CM | POA: Insufficient documentation

## 2020-12-11 DIAGNOSIS — Z7982 Long term (current) use of aspirin: Secondary | ICD-10-CM | POA: Diagnosis not present

## 2020-12-11 DIAGNOSIS — R140 Abdominal distension (gaseous): Secondary | ICD-10-CM | POA: Insufficient documentation

## 2020-12-11 DIAGNOSIS — R079 Chest pain, unspecified: Secondary | ICD-10-CM | POA: Insufficient documentation

## 2020-12-11 DIAGNOSIS — I110 Hypertensive heart disease with heart failure: Secondary | ICD-10-CM | POA: Insufficient documentation

## 2020-12-11 DIAGNOSIS — Z9981 Dependence on supplemental oxygen: Secondary | ICD-10-CM | POA: Insufficient documentation

## 2020-12-11 DIAGNOSIS — R002 Palpitations: Secondary | ICD-10-CM | POA: Diagnosis not present

## 2020-12-11 DIAGNOSIS — I5032 Chronic diastolic (congestive) heart failure: Secondary | ICD-10-CM | POA: Insufficient documentation

## 2020-12-11 DIAGNOSIS — Z79899 Other long term (current) drug therapy: Secondary | ICD-10-CM | POA: Insufficient documentation

## 2020-12-11 DIAGNOSIS — I10 Essential (primary) hypertension: Secondary | ICD-10-CM

## 2020-12-11 NOTE — Patient Instructions (Addendum)
Continue weighing daily and call for an overnight weight gain of > 2 pounds or a weekly weight gain of >5 pounds.    The paramedic, Silva Bandy, will call you next week to discuss paramedicine program.

## 2020-12-24 ENCOUNTER — Other Ambulatory Visit (HOSPITAL_COMMUNITY): Payer: Self-pay

## 2020-12-29 ENCOUNTER — Encounter (HOSPITAL_COMMUNITY): Payer: Self-pay

## 2020-12-29 NOTE — Progress Notes (Signed)
Had a home visit with Darin Hopkins and his sister.  He lives with his sister and she is his caregiver.  Explained the program and they feel it will be of services to them.  She controls his medications and she places them in a med box for him.  He has all her medications and aware of how to take them out of med box.  He is weighing daily and explained importance of doing it daily.  They appeared to understand.  He is on oxygen 24 hrs a day, would like to come off of it.  He is aware of up coming appts.  He appears to be in a good mood and discussed his life and what he would like to do.  He watches high sodium foods and careful with fluids.  Will visit weekly for a few weeks and visit for heart failure, diet and medication compliance.   [name] [place] EMT-Paramedic [phone]

## 2021-01-16 ENCOUNTER — Other Ambulatory Visit: Payer: Self-pay

## 2021-01-16 ENCOUNTER — Ambulatory Visit: Payer: Medicaid Other | Attending: Family | Admitting: Family

## 2021-01-16 ENCOUNTER — Encounter: Payer: Self-pay | Admitting: Family

## 2021-01-16 ENCOUNTER — Ambulatory Visit: Payer: Medicaid Other | Admitting: Internal Medicine

## 2021-01-16 ENCOUNTER — Encounter: Payer: Self-pay | Admitting: Internal Medicine

## 2021-01-16 VITALS — BP 133/87 | HR 68 | Resp 18 | Ht 67.0 in | Wt 131.1 lb

## 2021-01-16 DIAGNOSIS — J9611 Chronic respiratory failure with hypoxia: Secondary | ICD-10-CM

## 2021-01-16 DIAGNOSIS — I10 Essential (primary) hypertension: Secondary | ICD-10-CM

## 2021-01-16 DIAGNOSIS — Z79899 Other long term (current) drug therapy: Secondary | ICD-10-CM | POA: Diagnosis not present

## 2021-01-16 DIAGNOSIS — I5032 Chronic diastolic (congestive) heart failure: Secondary | ICD-10-CM

## 2021-01-16 DIAGNOSIS — Z87891 Personal history of nicotine dependence: Secondary | ICD-10-CM | POA: Diagnosis not present

## 2021-01-16 DIAGNOSIS — I110 Hypertensive heart disease with heart failure: Secondary | ICD-10-CM | POA: Diagnosis not present

## 2021-01-16 DIAGNOSIS — J449 Chronic obstructive pulmonary disease, unspecified: Secondary | ICD-10-CM

## 2021-01-16 DIAGNOSIS — J9612 Chronic respiratory failure with hypercapnia: Secondary | ICD-10-CM | POA: Diagnosis not present

## 2021-01-16 DIAGNOSIS — I890 Lymphedema, not elsewhere classified: Secondary | ICD-10-CM

## 2021-01-16 DIAGNOSIS — Z9981 Dependence on supplemental oxygen: Secondary | ICD-10-CM | POA: Insufficient documentation

## 2021-01-16 DIAGNOSIS — J431 Panlobular emphysema: Secondary | ICD-10-CM | POA: Diagnosis not present

## 2021-01-16 DIAGNOSIS — Z7982 Long term (current) use of aspirin: Secondary | ICD-10-CM | POA: Diagnosis not present

## 2021-01-16 MED ORDER — STIOLTO RESPIMAT 2.5-2.5 MCG/ACT IN AERS: 2.0000 | INHALATION_SPRAY | Freq: Every day | RESPIRATORY_TRACT | 11 refills | Status: DC

## 2021-01-16 MED ORDER — STIOLTO RESPIMAT 2.5-2.5 MCG/ACT IN AERS: 2.5000 ug | INHALATION_SPRAY | Freq: Two times a day (BID) | RESPIRATORY_TRACT | 0 refills | Status: DC

## 2021-01-16 MED ORDER — BISOPROLOL FUMARATE 5 MG PO TABS: ORAL_TABLET | ORAL | 11 refills | Status: DC

## 2021-01-16 NOTE — Patient Instructions (Signed)
Continue weighing daily and call for an overnight weight gain of > 2 pounds or a weekly weight gain of >5 pounds. 

## 2021-01-16 NOTE — Patient Instructions (Addendum)
Stop lisinopril and the carvedilol   Start bisoprolol 5 mg twice daily in place of both lisinopril and carvedolol.  Stop all inhalers except red one (Proair as below)  Plan A = Automatic = Always=    start stiolto 2 puff daily first thing in AM   Plan B = Backup (to supplement plan A, not to replace it) Only use your albuterol inhaler - red/Proair as a rescue medication to be used if you can't catch your breath by resting or doing a relaxed purse lip breathing pattern.  - The less you use it, the better it will work when you need it. - Ok to use the inhaler up to 2 puffs  every 4 hours if you must but call for appointment if use goes up over your usual need - Don't leave home without it !!  (think of it like the spare tire for your car)    Make sure you check your oxygen saturation at your highest level of activity  to be sure it stays 88-92% and adjust  02 flow upward to maintain this level if needed but remember to turn it back to previous settings when you stop (to conserve your supply).   If getting worse on this plan call me or go to the ER if after hours   Please schedule a follow up office visit in 6 weeks, call sooner if needed - bring all active medications/inhalers

## 2021-01-16 NOTE — Progress Notes (Signed)
Patient ID: Darin Hopkins, male    DOB: 07/11/1960, 60 y.o.   MRN: [id number]  HPI  Mr Penson is a 60 y/o male with a history of HTN, COPD, seizures, previous tobacco/ alcohol use and chronic heart failure.   Echo report from 08/18/20 reviewed and showed an EF of 60-65% along with mild LVH and moderate AS.  Admitted 11/25/20 due to worsening shortness of breath. Drinks a lot of fluids and is unsure of how much. Placed on bipap with improvement of symptoms & then weaned off to nasal cannula. Coreg decreased due to bradycardia. Discharged after 2 days. Was in the ED 10/02/20 due to peripheral edema. Treated with diuretics and he was released. Admitted 09/09/20 due to seizures. Neurology consult obtained. Intubated due to inability to protect his airway. Head CT negative for acute stroke. Keppra increased. Extubated successfully and placed on home oxygen of 3L. Discharged after 8 days.   He presents today for a follow-up visit with a chief complaint of minimal shortness of breath upon moderate exertion. He describes this as chronic in nature having been present for several years. He has associated fatigue and pedal edema (improving) along with this. He denies any difficulty sleeping, dizziness, abdominal distention, palpitations, chest pain cough or weight gain.   Continues to wear his oxygen at 4L when home but 2L when he's out so that his oxygen tank will last. Sister says that the oxygen company is supposed to be helping him get the smaller portable tank. She says that he is sleeping better at night which means she is also getting more sleep at night.   Going to see pulmonologist later today.   Past Medical History:  Diagnosis Date   CHF (congestive heart failure) (HCC)    COPD (chronic obstructive pulmonary disease) (HCC)    Hypertension    Seizures (HCC)    No past surgical history on file. No family history on file. Social History   Tobacco Use   Smoking status: Former    Packs/day:  2.00    Types: Cigarettes   Smokeless tobacco: Never  Substance Use Topics   Alcohol use: Yes    Alcohol/week: 3.0 standard drinks    Types: 3 Cans of beer per week    Comment: daily   No Known Allergies  Prior to Admission medications   Medication Sig Start Date End Date Taking? Authorizing Provider  amLODipine (NORVASC) 10 MG tablet Take 10 mg by mouth every evening. 07/28/20  Yes [provider]  aspirin 81 MG EC tablet Take 81 mg by mouth in the morning.   Yes [provider]  atorvastatin (LIPITOR) 20 MG tablet Take 20 mg by mouth every evening. 07/28/20  Yes [provider]  cholecalciferol (VITAMIN D) 25 MCG (1000 UNIT) tablet Take 2,000 Units by mouth daily. 11/17/20  Yes [provider]  Cholecalciferol (VITAMIN D-3) 25 MCG (1000 UT) CAPS Take 1,000 Units by mouth in the morning.   Yes [provider]  doxazosin (CARDURA) 2 MG tablet Take 2 mg by mouth every evening. 07/28/20  Yes [provider]  folic acid (FOLVITE) 1 MG tablet Take 1 mg by mouth daily. 04/17/20  Yes [provider]  furosemide (LASIX) 40 MG tablet Take 40 mg by mouth daily. 11/17/20  Yes [provider]  levETIRAcetam (KEPPRA) 750 MG tablet Take 1 tablet (750 mg total) by mouth 2 (two) times daily. 09/12/20  Yes Pokhrel, Rebekah Chesterfield, MD  Multiple Vitamin (MULTIVITAMIN ADULT) TABS  Take 2 tablets by mouth daily.   Yes [provider]  omeprazole (PRILOSEC) 20 MG capsule Take 20 mg by mouth daily before breakfast. 07/28/20  Yes [provider]  potassium chloride (KLOR-CON) 10 MEQ tablet Take 10 mEq by mouth daily. 11/17/20  Yes [provider]  PROAIR HFA 108 (90 Base) MCG/ACT inhaler Inhale 2 puffs into the lungs every 6 (six) hours as needed for shortness of breath or wheezing. 07/28/20  Yes [provider]  thiamine 100 MG tablet Take 100 mg by mouth daily. 11/17/20  Yes [provider]  bisoprolol (ZEBETA) 5 MG  tablet One twice daily 01/16/21   Nyoka Cowden, MD  Tiotropium Bromide-Olodaterol (STIOLTO RESPIMAT) 2.5-2.5 MCG/ACT AERS Inhale 2 puffs into the lungs daily. 01/16/21   Nyoka Cowden, MD  Tiotropium Bromide-Olodaterol (STIOLTO RESPIMAT) 2.5-2.5 MCG/ACT AERS Inhale 2.5 mcg into the lungs in the morning and at bedtime. 01/16/21   Nyoka Cowden, MD   Review of Systems  Constitutional:  Positive for fatigue. Negative for appetite change.  HENT:  Negative for congestion, postnasal drip and sore throat.   Eyes: Negative.   Respiratory:  Positive for shortness of breath. Negative for cough and chest tightness.   Cardiovascular:  Positive for leg swelling (improving). Negative for chest pain and palpitations.  Gastrointestinal:  Negative for abdominal distention and abdominal pain.  Endocrine: Negative.   Genitourinary: Negative.  Negative for scrotal swelling.  Musculoskeletal:  Negative for back pain and neck pain.  Skin: Negative.   Allergic/Immunologic: Negative.   Neurological:  Negative for dizziness and light-headedness.  Hematological:  Negative for adenopathy. Does not bruise/bleed easily.  Psychiatric/Behavioral:  Negative for dysphoric mood and sleep disturbance (sleeping on 2 pillows with oxygen). The patient is not nervous/anxious.    Vitals:   01/16/21 0915  BP: 133/87  Pulse: 68  Resp: 18  SpO2: 94%  Weight: 131 lb 2 oz (59.5 kg)  Height: 5\' 7"  (1.702 m)   Wt Readings from Last 3 Encounters:  01/16/21 131 lb 2 oz (59.5 kg)  12/24/20 124 lb (56.2 kg)  12/11/20 126 lb 4 oz (57.3 kg)   Lab Results  Component Value Date   CREATININE 1.47 (H) 11/27/2020   CREATININE 1.57 (H) 11/26/2020   CREATININE 1.59 (H) 11/25/2020    Physical Exam Vitals and nursing note reviewed. Exam conducted with a chaperone present (sister).  Constitutional:      Appearance: Normal appearance.  HENT:     Head: Normocephalic and atraumatic.  Cardiovascular:     Rate and Rhythm: Normal rate  and regular rhythm.  Pulmonary:     Effort: Pulmonary effort is normal.     Breath sounds: No wheezing or rales.  Abdominal:     General: There is no distension.     Palpations: Abdomen is soft.     Tenderness: There is no abdominal tenderness.  Musculoskeletal:        General: No tenderness.     Cervical back: Normal range of motion and neck supple.     Right lower leg: Edema (trace pitting) present.     Left lower leg: Edema (trace pitting) present.  Skin:    General: Skin is warm and dry.  Neurological:     Mental Status: He is alert and oriented to person, place, and time. Mental status is at baseline.  Psychiatric:        Mood and Affect: Mood normal.        Behavior:  Behavior normal.   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)- - NYHA class II - euvolemic today - weighing daily; reminded to call for an overnight weight gain of > 2 pounds or a weekly weight gain of > 5 pounds - weight up 5 pounds  from last visit here 1 month ago - rarely adding salt and his sister has been reading food labels for sodium content - needs to get established with cardiology but will defer for now - BP has been low in the past but consider adding entresto at next visit and/ or jardiance - participating in paramedicine program and she is checking his medications as he's getting them bubble packed through his pharmacy - BNP 11/25/20 was 575.8  2: HTN- - BP looks good today - follows with [facility] and saw them ~ 2 weeks ago - BMP 11/27/20 reviewed and showed sodium 143, potassium 4.3, creatinine 1.47 and GFR 54  3: COPD- - wearing oxygen at 4L around the clock although he decreases it to 2L when he's out so that it will last longer - sister says that the oxygen company is supposed to be helping him get the smaller portable oxygen tank - seeing pulmonology Research officer, political party) later today - continues to not smoke and he was encouraged to  continue to not smoke  4: Lymphedema- - stage 2 - not elevating his legs and he was encouraged to elevate them when sitting for long periods of time - doing some walking - not wearing compression socks but edema has improved - consider compression boots if edema worsens   Patient did not bring his medications nor a list. Each medication was verbally reviewed with the patient and he was encouraged to bring the bottles to every visit to confirm accuracy of list.   Return in 1 month or sooner for any questions/problems before then.

## 2021-01-16 NOTE — Progress Notes (Signed)
Darin Hopkins, male    DOB: Mar 25, 1961,    MRN: [id number]   Brief patient profile:   29 yobm former smoker stopped summer 2021 with asthma as child with dx of copd around 2019 on daily inhalers and 02 x 2022 with chronic hypercarbic and hypoxemic Resp failure referred to pulmonary clinic in [facility]  01/16/2021 by Martie Round   s/p admit:   Admit date: 11/25/2020 Discharge date: 11/27/2020      Discharge Diagnoses:  Principal Problem:   Acute hypoxemic respiratory failure (HCC)   Acute exacerbation of CHF (congestive heart failure) (HCC)   Essential hypertension   History of seizure   COPD (chronic obstructive pulmonary disease) (HCC)   Hyperlipidemia   GERD (gastroesophageal reflux disease)   Protein-calorie malnutrition, severe   COPD exacerbation (HCC)   Chronic kidney disease, stage III (moderate) (HCC)   AKI (acute kidney injury) (HCC)     History of present illness:  Darin Hopkins is a 60 y.o. male with medical history significant for hypertension, GERD, history of seizure, last seizure was in May 2022, CKD3, hyperlipidemia, COPD, history of CVA about 2.5 years ago, tobacco use, presents to the ED for chief concern of shortness of breath.    He reports that the shortness of breath started AM prior to hospitalization.  He states that the shortness of breath is worse with exertion.  States he ran out of his inhaler for 1 day. He states he tries to drink a lot of water and he does not know the quantity in which he drinks fluid.  He denies drinking soda, tea, EtOH.  He states specifically that his last EtOH drink was December 2021.   He reports improvement with BiPAP.   Hospital Course:  Patient presented with acute on chronic hypoxic respiratory failure. Treated briefly with bipap, then transitioned to home O2. Initially treated for CHF exacerbation with diuresis, but overall picture more suggestive of copd exacerbation. Treated with prednisone which he will continue.  Patient also had AKI of 1.57 from baseline in low 1s, improved to 1.47 on day of discharge, will need check of kidney function at f/u. Noted to be bradycardic and asymptomatic during his hospital stay, dose of coreg was decreased. Has a hx of seizures, none this hospitalization. Will need close pcp f/u.      History of Present Illness  01/16/2021  Pulmonary/ 1st office eval/ Asheley Hellberg / [employer]  Chief Complaint  Patient presents with   Consult    Copd- 4L   Dyspnea:  also limited by balance but extremely sedentary - 25 ft max on fixed 02 = 4lpm  Cough: very hoarse, freq throat clearing but min mucoid sputum  Sleep: flat bed, one pillow does ok/ no am exac SABA use: not really helping, very poor technique   No obvious day to day or daytime variability or assoc excess/ purulent sputum or mucus plugs or hemoptysis or cp or chest tightness, subjective wheeze or overt sinus or hb symptoms.   Sleeping as above without nocturnal  or early am exacerbation  of respiratory  c/o's or need for noct saba. Also denies any obvious fluctuation of symptoms with weather or environmental changes or other aggravating or alleviating factors except as outlined above   No unusual exposure hx or h/o childhood pna/  or knowledge of premature birth.  Current Allergies, Complete Past Medical History, Past Surgical History, Family History, and Social History were reviewed in [employer] record.  ROS  The following are not active complaints unless bolded Hoarseness, sore throat, dysphagia, dental problems, itching, sneezing,  nasal congestion or discharge of excess mucus or purulent secretions, ear ache,   fever, chills, sweats, unintended wt loss or wt gain, classically pleuritic or exertional cp,  orthopnea pnd or arm/hand swelling  or leg swelling, presyncope, palpitations, abdominal pain, anorexia, nausea, vomiting, diarrhea  or change in bowel habits or change in bladder habits, change in  stools or change in urine, dysuria, hematuria,  rash, arthralgias, visual complaints, headache, numbness, weakness or ataxia or problems with walking or coordination,  change in mood or  memory.           Past Medical History:  Diagnosis Date   CHF (congestive heart failure) (HCC)    COPD (chronic obstructive pulmonary disease) (HCC)    Hypertension    Seizures (HCC)     Outpatient Medications Prior to Visit  Medication Sig Dispense Refill   amLODipine (NORVASC) 10 MG tablet Take 10 mg by mouth every evening.     aspirin 81 MG EC tablet Take 81 mg by mouth in the morning.     atorvastatin (LIPITOR) 20 MG tablet Take 20 mg by mouth every evening.     carvedilol (COREG) 12.5 MG tablet Take 2 tablets (25 mg total) by mouth 2 (two) times daily with a meal. 60 tablet 1   cholecalciferol (VITAMIN D) 25 MCG (1000 UNIT) tablet Take 2,000 Units by mouth daily.     Cholecalciferol (VITAMIN D-3) 25 MCG (1000 UT) CAPS Take 1,000 Units by mouth in the morning.     doxazosin (CARDURA) 2 MG tablet Take 2 mg by mouth every evening.     folic acid (FOLVITE) 1 MG tablet Take 1 mg by mouth daily.     furosemide (LASIX) 40 MG tablet Take 40 mg by mouth daily.     levETIRAcetam (KEPPRA) 750 MG tablet Take 1 tablet (750 mg total) by mouth 2 (two) times daily. 60 tablet 2   lisinopril (ZESTRIL) 5 MG tablet Take 5 mg by mouth daily.     Multiple Vitamin (MULTIVITAMIN ADULT) TABS Take 2 tablets by mouth daily.     omeprazole (PRILOSEC) 20 MG capsule Take 20 mg by mouth daily before breakfast.     potassium chloride (KLOR-CON) 10 MEQ tablet Take 10 mEq by mouth daily.     predniSONE (DELTASONE) 20 MG tablet Take 2 tablets (40 mg total) by mouth daily with breakfast. 6 tablet 0   PROAIR HFA 108 (90 Base) MCG/ACT inhaler Inhale 2 puffs into the lungs every 6 (six) hours as needed for shortness of breath or wheezing.     SPIRIVA HANDIHALER 18 MCG inhalation capsule Place 1 capsule (18 mcg total) into inhaler and  inhale daily. 30 capsule 12   thiamine 100 MG tablet Take 100 mg by mouth daily.     tiotropium (SPIRIVA HANDIHALER) 18 MCG inhalation capsule Place 1 capsule (18 mcg total) into inhaler and inhale daily. 30 capsule 2   No facility-administered medications prior to visit.     Objective:     BP 130/84 (BP Location: Left Arm, Patient Position: Sitting, Cuff Size: Normal)   Pulse 85   Temp 97.7 F (36.5 C) (Oral)   Ht 5' 7.5" (1.715 m)   Wt 130 lb 9.6 oz (59.2 kg)   SpO2 90%   BMI 20.15 kg/m   SpO2: 90 %  HEENT : pt wearing mask not removed for exam due  to covid -19 concerns.    NECK :  without JVD/Nodes/TM/ nl carotid upstrokes bilaterally   LUNGS: no acc muscle use,  Mod barrel  contour chest wall with bilateral  Distant bs s audible wheeze and  without cough on insp or exp maneuvers and mod  Hyperresonant  to  percussion bilaterally     CV:  RRR  no s3 or murmur or increase in P2, and no edema   ABD:  soft and nontender with pos mid insp Hoover's  in the supine position. No bruits or organomegaly appreciated, bowel sounds nl  MS:     ext warm without deformities, calf tenderness, cyanosis or clubbing No obvious joint restrictions   SKIN: warm and dry without lesions    NEURO:  alert, approp, nl sensorium with  mild resting tremor          I personally reviewed images and agree with radiology impression as follows:  CXR:   portable 11/25/20  No acute cardiopulmonary disease.   I personally reviewed images and agree with radiology impression as follows:   Chest CT 08/20/20 Diffuse bronchial wall thickening with mild to moderate centrilobular and paraseptal emphysema. High-resolution images demonstrate no definite regions of ground-glass attenuation, septal thickening, subpleural reticulation, traction bronchiectasis or frank honeycombing to indicate interstitial lung disease. Inspiratory and expiratory imaging is unremarkable in appearance. Small bilateral  pleural effusions with areas of dependent subsegmental atelectasis in the lower lobes of the lungs bilaterally.     Assessment   COPD  GOLD ? spirometry / 02 dep/ hypercarbic  Quit smoking summer 2021 - 01/16/2021  After extensive coaching inhaler device,  effectiveness =    75% > try stiolto 2 pffs each am and off acei/ coreg    hfa very poor technique   DDX of  difficult airways management almost all start with A and  include Adherence, Ace Inhibitors, Acid Reflux, Active Sinus Disease, Alpha 1 Antitripsin deficiency, Anxiety masquerading as Airways dz,  ABPA,  Allergy(esp in young), Aspiration (esp in elderly), Adverse effects of meds,  Active smoking or vaping, A bunch of PE's (a small clot burden can't cause this syndrome unless there is already severe underlying pulm or vascular dz with poor reserve) plus two Bs  = Bronchiectasis and Beta blocker use..and one C= CHF   Adherence is always the initial "prime suspect" and is a multilayered concern that requires a "trust but verify" approach in every patient - starting with knowing how to use medications, especially inhalers, correctly, keeping up with refills and understanding the fundamental difference between maintenance and prns vs those medications only taken for a very short course and then stopped and not refilled.  - see SMI/hfa  Teaching - Return  with all meds in hand using a trust but verify approach to confirm accurate Medication  Reconciliation The principal here is that until we are certain that the  patients are doing what we've asked, it makes no sense to ask them to do more.   Active smoking >  Denies x one year/ reinforced   ACEi adverse effects at the  top of the usual list of suspects and the only way to rule it out is a trial off > see a/p   Adverse effects of DPI > d/c wixella and use stiolto 2 pffs each am     ? Acid (or non-acid) GERD > always difficult to exclude as up to 75% of pts in some series report no assoc GI/  Heartburn  symptoms> rec continue max (24h)  acid suppression and diet restrictions/ reviewed     ? BB effects: In the setting of respiratory symptoms of unknown etiology,  It would be preferable to use bystolic, the most beta -1  selective Beta blocker available in sample form, with bisoprolol the most selective generic choice  on the market, at least on a trial basis, to make sure the spillover Beta 2 effects of the less specific Beta blockers are not contributing to this patient's symptoms. >>> try bisoprolol 5 mg bid and titrate up if need / off coreg.  ? chf > looks more like diastolic dysfunction than systolic dysfunction so should be ok with just bisoprolol   F/u in 6 weeks - call sooner if needed  Chronic respiratory failure with hypoxia and hypercapnia (HCC) 02 dep since ? 2022 -  [facility]  11/27/20   =  38  - 01/16/2021 desaturated on 4lpm walking 25 ft x 2  Down to sats upper 70s  Advised: Make sure you check your oxygen saturation  at your highest level of activity  to be sure it stays    88% - 92%  and adjust  02 flow upward to maintain this level if needed but remember to turn it back to previous settings when you stop (to conserve your supply).    Each maintenance medication was reviewed in detail including emphasizing most importantly the difference between maintenance and prns and under what circumstances the prns are to be triggered using an action plan format where appropriate.  Total time for H and P, chart review, counseling, reviewing smi/ hfa  device(s) , directly observing portions of ambulatory 02 saturation study/ and generating customized AVS unique to this office visit / same day charting > 60 min                   Sandrea Hughs, MD 01/16/2021

## 2021-01-16 NOTE — Assessment & Plan Note (Addendum)
Quit smoking summer 2021 - 01/16/2021  After extensive coaching inhaler device,  effectiveness =    75% > try stiolto 2 pffs each am and off acei/ coreg    hfa very poor technique   DDX of  difficult airways management almost all start with A and  include Adherence, Ace Inhibitors, Acid Reflux, Active Sinus Disease, Alpha 1 Antitripsin deficiency, Anxiety masquerading as Airways dz,  ABPA,  Allergy(esp in young), Aspiration (esp in elderly), Adverse effects of meds,  Active smoking or vaping, A bunch of PE's (a small clot burden can't cause this syndrome unless there is already severe underlying pulm or vascular dz with poor reserve) plus two Bs  = Bronchiectasis and Beta blocker use..and one C= CHF   Adherence is always the initial "prime suspect" and is a multilayered concern that requires a "trust but verify" approach in every patient - starting with knowing how to use medications, especially inhalers, correctly, keeping up with refills and understanding the fundamental difference between maintenance and prns vs those medications only taken for a very short course and then stopped and not refilled.  - see SMI/hfa  Teaching - Return  with all meds in hand using a trust but verify approach to confirm accurate Medication  Reconciliation The principal here is that until we are certain that the  patients are doing what we've asked, it makes no sense to ask them to do more.   Active smoking >  Denies x one year/ reinforced   ACEi adverse effects at the  top of the usual list of suspects and the only way to rule it out is a trial off > see a/p   Adverse effects of DPI > d/c wixella and use stiolto 2 pffs each am     ? Acid (or non-acid) GERD > always difficult to exclude as up to 75% of pts in some series report no assoc GI/ Heartburn symptoms> rec continue max (24h)  acid suppression and diet restrictions/ reviewed     ? BB effects: In the setting of respiratory symptoms of unknown etiology,  It would  be preferable to use bystolic, the most beta -1  selective Beta blocker available in sample form, with bisoprolol the most selective generic choice  on the market, at least on a trial basis, to make sure the spillover Beta 2 effects of the less specific Beta blockers are not contributing to this patient's symptoms. >>> try bisoprolol 5 mg bid and titrate up if need / off coreg.  ? chf > looks more like diastolic dysfunction than systolic dysfunction so should be ok with just bisoprolol   F/u in 6 weeks - call sooner if needed

## 2021-01-16 NOTE — Assessment & Plan Note (Addendum)
02 dep since ? 2022 -  [facility]  11/27/20   =  38  - 01/16/2021 desaturated on 4lpm walking 25 ft x 2  Down to sats upper 70s  Advised: Make sure you check your oxygen saturation  at your highest level of activity  to be sure it stays    88% - 92%  and adjust  02 flow upward to maintain this level if needed but remember to turn it back to previous settings when you stop (to conserve your supply).    Each maintenance medication was reviewed in detail including emphasizing most importantly the difference between maintenance and prns and under what circumstances the prns are to be triggered using an action plan format where appropriate.  Total time for H and P, chart review, counseling, reviewing smi/ hfa  device(s) , directly observing portions of ambulatory 02 saturation study/ and generating customized AVS unique to this office visit / same day charting > 60 min

## 2021-01-19 ENCOUNTER — Encounter (HOSPITAL_COMMUNITY): Payer: Self-pay

## 2021-01-19 ENCOUNTER — Other Ambulatory Visit (HOSPITAL_COMMUNITY): Payer: Self-pay

## 2021-01-19 NOTE — Progress Notes (Signed)
Today had a home visit with [place] and his sister.  He states doing well.  They are picking up his new pulmonology med today.  He states oxygen is on 3.5 liters today and doing fine.  He has minimal edema in lower extremities today.  Discussed red zones, high sodium foods and how much fluids to intake daily. Denies increased of shortness of breath.  He states doing his spirometer, he states can tell a difference since using them.  He is aware to keep oxygen at 92% or above.  He lives with his sister and she helps with him going to doctor appts, his meds and daily living.  Mood appears ok.  He would like to come off oxygen.  He is sleeping better at night and not taking naps in daytime.  They are aware of what medications he is to take and how.  They are getting pill packs.  They are aware to call with any problems.  He denies any problems such as chest pain, headaches or dizziness.  Will continue to visit for heart failure, diet and medication management.   [name]  EMT-Paramedic [phone]

## 2021-01-29 ENCOUNTER — Telehealth (HOSPITAL_COMMUNITY): Payer: Self-pay

## 2021-01-29 NOTE — Telephone Encounter (Signed)
Had a home visit scheduled.  His sister called and advised would not be home due to going to doctors.  His sister has been sick and getting tested for covid.  Will visit next week.  Unnamed is doing well with no complaints.   [name] [place] EMT-Paramedic [phone]

## 2021-02-11 ENCOUNTER — Ambulatory Visit: Payer: Medicaid Other | Admitting: Family

## 2021-02-18 ENCOUNTER — Encounter (HOSPITAL_COMMUNITY): Payer: Self-pay

## 2021-02-18 ENCOUNTER — Other Ambulatory Visit (HOSPITAL_COMMUNITY): Payer: Self-pay

## 2021-02-18 NOTE — Progress Notes (Signed)
Today had a home visit with Mychael and his sister.  He states doing better, breathing is getting better.  He states moving more and doing more things.  He is going to bed at 5 pm and staying in bed til 6 in morning.  He states his sister wakes him up during the night to check on him several times.  He states watches tv some.  He does not get continuous sleep.  His sister states she worries about him, explain to both they need continuous sleep during the night.  He states appetite is good.  He is aware of upcoming appts.  He has all his medications, he gets pill packs.  His lives with his sister and her husband.  His sister is very protective of him.  She explains she lost a brother and bout killed her, she was hospitalized.  He denies any problems such as chest pain, headaches, dizziness or increased shortness of breath.  Will continue to visit for heart failure.   [name]  EMT-Paramedic [phone]

## 2021-02-25 ENCOUNTER — Ambulatory Visit: Payer: Medicaid Other | Admitting: Family

## 2021-03-03 ENCOUNTER — Ambulatory Visit: Payer: Medicaid Other | Admitting: Internal Medicine

## 2021-03-03 NOTE — Progress Notes (Signed)
Patient ID: Darin Hopkins, male    DOB: April 30, 1961, 60 y.o.   MRN: [id number]  HPI  [name] is a 60 y/o male with a history of HTN, COPD, seizures, previous tobacco/ alcohol use and chronic heart failure.   Echo report from 08/18/20 reviewed and showed an EF of 60-65% along with mild LVH and moderate AS.  Admitted 11/25/20 due to worsening shortness of breath. Drinks a lot of fluids and is unsure of how much. Placed on bipap with improvement of symptoms & then weaned off to nasal cannula. Coreg decreased due to bradycardia. Discharged after 2 days. Was in the ED 10/02/20 due to peripheral edema. Treated with diuretics and he was released. Admitted 09/09/20 due to seizures. Neurology consult obtained. Intubated due to inability to protect his airway. Head CT negative for acute stroke. Keppra increased. Extubated successfully and placed on home oxygen of 3L. Discharged after 8 days.   He presents today for a follow-up visit with a chief complaint of minimal fatigue upon moderate exertion. He says this has been chronic in nature having been present for years although is "much better". He has no other symptoms and specifically denies any difficulty sleeping, dizziness, abdominal distention, palpitations, pedal edema, chest pain, shortness of breath, cough or weight gain.   Did not bring his medications with him but his sister says that medications continue to be bubble packed from his pharmacy. Saw pulmonology since he was last here and has been working on weaning his oxygen down. Currently wearing 3.5L (was previously 4L).   Past Medical History:  Diagnosis Date   CHF (congestive heart failure) (HCC)    COPD (chronic obstructive pulmonary disease) (HCC)    Hypertension    Seizures (HCC)    No past surgical history on file. No family history on file. Social History   Tobacco Use   Smoking status: Former    Packs/day: 2.00    Types: Cigarettes   Smokeless tobacco: Never  Substance Use Topics    Alcohol use: Yes    Alcohol/week: 3.0 standard drinks    Types: 3 Cans of beer per week    Comment: daily   No Known Allergies  Prior to Admission medications   Medication Sig Start Date End Date Taking? Authorizing Provider  amLODipine (NORVASC) 10 MG tablet Take 10 mg by mouth every evening. 07/28/20   [provider]  aspirin 81 MG EC tablet Take 81 mg by mouth in the morning.    [provider]  atorvastatin (LIPITOR) 20 MG tablet Take 20 mg by mouth every evening. 07/28/20   [provider]  bisoprolol (ZEBETA) 5 MG tablet One twice daily 01/16/21   Nyoka Cowden, MD  cholecalciferol (VITAMIN D) 25 MCG (1000 UNIT) tablet Take 2,000 Units by mouth daily. 11/17/20   [provider]  Cholecalciferol (VITAMIN D-3) 25 MCG (1000 UT) CAPS Take 1,000 Units by mouth in the morning.    [provider]  doxazosin (CARDURA) 2 MG tablet Take 2 mg by mouth every evening. 07/28/20   [provider]  folic acid (FOLVITE) 1 MG tablet Take 1 mg by mouth daily. 04/17/20   [provider]  furosemide (LASIX) 40 MG tablet Take 40 mg by mouth daily. 11/17/20   [provider]  levETIRAcetam (KEPPRA) 750 MG tablet Take 1 tablet (750 mg total) by mouth 2 (two) times daily. 09/12/20   Pokhrel, Rebekah [place], MD  Multiple Vitamin (MULTIVITAMIN ADULT) TABS Take 2 tablets by  mouth daily.    [provider]  omeprazole (PRILOSEC) 20 MG capsule Take 20 mg by mouth daily before breakfast. 07/28/20   [provider]  potassium chloride (KLOR-CON) 10 MEQ tablet Take 10 mEq by mouth daily. 11/17/20   [provider]  PROAIR HFA 108 (90 Base) MCG/ACT inhaler Inhale 2 puffs into the lungs every 6 (six) hours as needed for shortness of breath or wheezing. 07/28/20   [provider]  thiamine 100 MG tablet Take 100 mg by mouth daily. 11/17/20   [provider]  Tiotropium Bromide-Olodaterol (STIOLTO RESPIMAT) 2.5-2.5 MCG/ACT  AERS Inhale 2 puffs into the lungs daily. 01/16/21   Nyoka Cowden, MD  Tiotropium Bromide-Olodaterol (STIOLTO RESPIMAT) 2.5-2.5 MCG/ACT AERS Inhale 2.5 mcg into the lungs in the morning and at bedtime. 01/16/21   Nyoka Cowden, MD    Review of Systems  Constitutional:  Positive for fatigue. Negative for appetite change.  HENT:  Negative for congestion, postnasal drip and sore throat.   Eyes: Negative.   Respiratory:  Negative for cough, chest tightness and shortness of breath.   Cardiovascular:  Negative for chest pain, palpitations and leg swelling.  Gastrointestinal:  Negative for abdominal distention and abdominal pain.  Endocrine: Negative.   Genitourinary: Negative.  Negative for scrotal swelling.  Musculoskeletal:  Negative for back pain and neck pain.  Skin: Negative.   Allergic/Immunologic: Negative.   Neurological:  Negative for dizziness and light-headedness.  Hematological:  Negative for adenopathy. Does not bruise/bleed easily.  Psychiatric/Behavioral:  Negative for dysphoric mood and sleep disturbance (sleeping on 2 pillows with oxygen). The patient is not nervous/anxious.    Vitals:   03/05/21 0924  BP: (!) 157/87  Pulse: 76  Resp: 16  SpO2: 91%  Weight: 133 lb 6 oz (60.5 kg)  Height: 5\' 7"  (1.702 m)   Wt Readings from Last 3 Encounters:  03/05/21 133 lb 6 oz (60.5 kg)  02/18/21 130 lb (59 kg)  01/19/21 130 lb (59 kg)   Lab Results  Component Value Date   CREATININE 1.47 (H) 11/27/2020   CREATININE 1.57 (H) 11/26/2020   CREATININE 1.59 (H) 11/25/2020    Physical Exam Vitals and nursing note reviewed. Exam conducted with a chaperone present (sister).  Constitutional:      Appearance: Normal appearance.  HENT:     Head: Normocephalic and atraumatic.  Cardiovascular:     Rate and Rhythm: Normal rate and regular rhythm.  Pulmonary:     Effort: Pulmonary effort is normal.     Breath sounds: No wheezing or rales.  Abdominal:     General: There is no  distension.     Palpations: Abdomen is soft.     Tenderness: There is no abdominal tenderness.  Musculoskeletal:        General: No tenderness.     Cervical back: Normal range of motion and neck supple.     Right lower leg: Edema (trace pitting) present.     Left lower leg: Edema (trace pitting) present.  Skin:    General: Skin is warm and dry.  Neurological:     Mental Status: He is alert and oriented to person, place, and time. Mental status is at baseline.  Psychiatric:        Mood and Affect: Mood normal.        Behavior: Behavior normal.   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)- - NYHA class II -  euvolemic today - weighing daily; reminded to call for an overnight weight gain of > 2 pounds or a weekly weight gain of > 5 pounds - weight up 2 pounds  from last visit here 6 weeks ago - rarely adding salt and his sister has been reading food labels for sodium content - needs to get established with cardiology but will defer for now - wanted to add entresto but patient nor sister can verify his med list; have asked [facility] to fax med list and emphasized that they bring bubble packs every time - participating in paramedicine program  - BNP 11/25/20 was 575.8 - he does not take the flu vaccine  2: HTN- - BP mildly elevated (157/87) - follows with [facility]  - BMP 11/27/20 reviewed and showed sodium 143, potassium 4.3, creatinine 1.47 and GFR 54  3: COPD- - wearing oxygen at 3.5L around the clock although he decreases it to 2L when he's out so that it will last longer - saw pulmonology Sherene Sires) 01/16/21; patient missed a f/u appointment with him on 03/03/21 that the sister says no one called her about; she says that she will call his office to get this rescheduled - continues to not smoke and he was encouraged to continue to not smoke  4: Lymphedema- - resolved   Patient did not bring his medications nor  a list. Each medication was verbally reviewed with the patient and he was encouraged to bring the bottles to every visit to confirm accuracy of list.   Return in 2 months or sooner for any questions/problems before then.

## 2021-03-03 NOTE — Progress Notes (Deleted)
Darin Hopkins, male    DOB: 1960-05-12,    MRN: [id number]   Brief patient profile:   3 yobm former smoker stopped summer 2021 with asthma as child with dx of copd around 2019 on daily inhalers and 02 x 2022 with chronic hypercarbic and hypoxemic Resp failure referred to pulmonary clinic in [facility]  01/16/2021 by Martie Round   s/p admit:   Admit date: 11/25/2020 Discharge date: 11/27/2020      Discharge Diagnoses:  Principal Problem:   Acute hypoxemic respiratory failure (HCC)   Acute exacerbation of CHF (congestive heart failure) (HCC)   Essential hypertension   History of seizure   COPD (chronic obstructive pulmonary disease) (HCC)   Hyperlipidemia   GERD (gastroesophageal reflux disease)   Protein-calorie malnutrition, severe   COPD exacerbation (HCC)   Chronic kidney disease, stage III (moderate) (HCC)   AKI (acute kidney injury) (HCC)     History of present illness:  Darin Hopkins is a 60 y.o. male with medical history significant for hypertension, GERD, history of seizure, last seizure was in May 2022, CKD3, hyperlipidemia, COPD, history of CVA about 2.5 years ago, tobacco use, presents to the ED for chief concern of shortness of breath.    He reports that the shortness of breath started AM prior to hospitalization.  He states that the shortness of breath is worse with exertion.  States he ran out of his inhaler for 1 day. He states he tries to drink a lot of water and he does not know the quantity in which he drinks fluid.  He denies drinking soda, tea, EtOH.  He states specifically that his last EtOH drink was December 2021.   He reports improvement with BiPAP.   Hospital Course:  Patient presented with acute on chronic hypoxic respiratory failure. Treated briefly with bipap, then transitioned to home O2. Initially treated for CHF exacerbation with diuresis, but overall picture more suggestive of copd exacerbation. Treated with prednisone which he will continue.  Patient also had AKI of 1.57 from baseline in low 1s, improved to 1.47 on day of discharge, will need check of kidney function at f/u. Noted to be bradycardic and asymptomatic during his hospital stay, dose of coreg was decreased. Has a hx of seizures, none this hospitalization. Will need close pcp f/u.      History of Present Illness  01/16/2021  Pulmonary/ 1st office eval/ Darin Hopkins / [employer]  Chief Complaint  Patient presents with   Consult    Copd- 4L   Dyspnea:  also limited by balance but extremely sedentary - 25 ft max on fixed 02 = 4lpm  Cough: very hoarse, freq throat clearing but min mucoid sputum  Sleep: flat bed, one pillow does ok/ no am exac SABA use: not really helping, very poor technique  Rec Stop lisinopril and the carvedilol  Start bisoprolol 5 mg twice daily in place of both lisinopril and carvedolol. stop all inhalers except red one (Proair as below) Plan A = Automatic = Always=    start stiolto 2 puff daily first thing in AM  Plan B = Backup (to supplement plan A, not to replace it) Only use your albuterol inhaler - red/Proair as a rescue medication  Make sure you check your oxygen saturation at your highest level of activity  to be sure it stays 88-92%    03/03/2021  f/u ov/Darin Hopkins/ [facility] re: copd/ hypox/hypercarb   maint on ***  No chief complaint on file.  Dyspnea:  *** Cough: *** Sleeping: *** SABA use: *** 02: *** Covid status:   ***   No obvious day to day or daytime variability or assoc excess/ purulent sputum or mucus plugs or hemoptysis or cp or chest tightness, subjective wheeze or overt sinus or hb symptoms.   *** without nocturnal  or early am exacerbation  of respiratory  c/o's or need for noct saba. Also denies any obvious fluctuation of symptoms with weather or environmental changes or other aggravating or alleviating factors except as outlined above   No unusual exposure hx or h/o childhood pna/ asthma or knowledge of  premature birth.  Current Allergies, Complete Past Medical History, Past Surgical History, Family History, and Social History were reviewed in [employer] record.  ROS  The following are not active complaints unless bolded Hoarseness, sore throat, dysphagia, dental problems, itching, sneezing,  nasal congestion or discharge of excess mucus or purulent secretions, ear ache,   fever, chills, sweats, unintended wt loss or wt gain, classically pleuritic or exertional cp,  orthopnea pnd or arm/hand swelling  or leg swelling, presyncope, palpitations, abdominal pain, anorexia, nausea, vomiting, diarrhea  or change in bowel habits or change in bladder habits, change in stools or change in urine, dysuria, hematuria,  rash, arthralgias, visual complaints, headache, numbness, weakness or ataxia or problems with walking or coordination,  change in mood or  memory.        No outpatient medications have been marked as taking for the 03/03/21 encounter (Appointment) with Nyoka Cowden, MD.            Objective:     Wt Readings from Last 3 Encounters:  02/18/21 130 lb (59 kg)  01/19/21 130 lb (59 kg)  01/16/21 130 lb 9.6 oz (59.2 kg)      Vital signs reviewed  03/03/2021  - Note at rest 02 sats  ***% on ***   General appearance:    ***       HEENT : pt wearing m   Mod bar ***    mild resting tremor                 Assessment

## 2021-03-05 ENCOUNTER — Other Ambulatory Visit: Payer: Self-pay | Admitting: Family

## 2021-03-05 ENCOUNTER — Ambulatory Visit: Payer: Medicaid Other | Attending: Family | Admitting: Family

## 2021-03-05 ENCOUNTER — Other Ambulatory Visit: Payer: Self-pay

## 2021-03-05 ENCOUNTER — Encounter: Payer: Self-pay | Admitting: Family

## 2021-03-05 VITALS — BP 143/87 | HR 76 | Resp 16 | Ht 67.0 in | Wt 133.4 lb

## 2021-03-05 DIAGNOSIS — J431 Panlobular emphysema: Secondary | ICD-10-CM

## 2021-03-05 DIAGNOSIS — I5032 Chronic diastolic (congestive) heart failure: Secondary | ICD-10-CM | POA: Diagnosis not present

## 2021-03-05 DIAGNOSIS — J449 Chronic obstructive pulmonary disease, unspecified: Secondary | ICD-10-CM | POA: Insufficient documentation

## 2021-03-05 DIAGNOSIS — Z7982 Long term (current) use of aspirin: Secondary | ICD-10-CM | POA: Diagnosis not present

## 2021-03-05 DIAGNOSIS — I890 Lymphedema, not elsewhere classified: Secondary | ICD-10-CM | POA: Diagnosis not present

## 2021-03-05 DIAGNOSIS — I110 Hypertensive heart disease with heart failure: Secondary | ICD-10-CM | POA: Insufficient documentation

## 2021-03-05 DIAGNOSIS — Z87891 Personal history of nicotine dependence: Secondary | ICD-10-CM | POA: Insufficient documentation

## 2021-03-05 DIAGNOSIS — Z7951 Long term (current) use of inhaled steroids: Secondary | ICD-10-CM | POA: Insufficient documentation

## 2021-03-05 DIAGNOSIS — Z9981 Dependence on supplemental oxygen: Secondary | ICD-10-CM | POA: Insufficient documentation

## 2021-03-05 DIAGNOSIS — Z7901 Long term (current) use of anticoagulants: Secondary | ICD-10-CM | POA: Insufficient documentation

## 2021-03-05 DIAGNOSIS — Z79899 Other long term (current) drug therapy: Secondary | ICD-10-CM | POA: Diagnosis not present

## 2021-03-05 DIAGNOSIS — I10 Essential (primary) hypertension: Secondary | ICD-10-CM | POA: Diagnosis not present

## 2021-03-05 DIAGNOSIS — Z09 Encounter for follow-up examination after completed treatment for conditions other than malignant neoplasm: Secondary | ICD-10-CM | POA: Insufficient documentation

## 2021-03-05 MED ORDER — VITAMIN D-3 25 MCG (1000 UT) PO CAPS: 2000.0000 [IU] | ORAL_CAPSULE | Freq: Every morning | ORAL | 0 refills | Status: DC

## 2021-03-05 MED ORDER — HYDRALAZINE HCL 50 MG PO TABS: 25.0000 mg | ORAL_TABLET | Freq: Three times a day (TID) | ORAL | 3 refills | Status: DC

## 2021-03-05 MED ORDER — TRAZODONE HCL 50 MG PO TABS: 50.0000 mg | ORAL_TABLET | Freq: Every day | ORAL | 0 refills | Status: DC

## 2021-03-05 MED ORDER — DULERA 200-5 MCG/ACT IN AERO: 2.0000 | INHALATION_SPRAY | Freq: Two times a day (BID) | RESPIRATORY_TRACT | 0 refills | Status: DC

## 2021-03-05 NOTE — Patient Instructions (Addendum)
Continue weighing daily and call for an overnight weight gain of > 2 pounds or a weekly weight gain of >5 pounds.   Bring ALL medications including any over the counter or vitamins to every visit.   

## 2021-03-05 NOTE — Progress Notes (Signed)
Med list updated

## 2021-04-07 ENCOUNTER — Encounter (HOSPITAL_COMMUNITY): Payer: Self-pay

## 2021-04-07 ENCOUNTER — Other Ambulatory Visit (HOSPITAL_COMMUNITY): Payer: Self-pay

## 2021-04-07 NOTE — Progress Notes (Signed)
Today had a home visit with Darin Hopkins and his sister.  He lives with his sister and her husband.  She is his caretaker.  He states doing well.  Denies any shortness of breath with activity.  He states appetite is good and sleeping is better.  She explained she is not waking him up as often to check on him, she is relaxing some.  He denies any pain or problems.  He watches how much fluid he intakes.  He drinks water and hawaiin punch throughout the day.  Mostly eats cooked food at home.  He stays active in the home.  He wears oxygen at 3.5 lpm 24 hours a day.  He weighs daily and aware to call with weight gain.  He has some edema in legs, wearing low socks, he states will start wearing his ted hose more.  They are aware of up coming appts.  He has all his medications and his sister fixes his meds for him.  They have everything for daily living.  Lungs are clear.  Will continue to visit for heart failure, diet and medication compliance.   [name] [place] EMT-Paramedic [phone]

## 2021-05-08 ENCOUNTER — Ambulatory Visit: Payer: Medicaid Other | Admitting: Family

## 2021-05-12 IMAGING — CT CT HEAD W/O CM
4 series · 16 of 47 positions shown, 18 images · non-contrast
Comparison: 08/27/2020

CLINICAL DATA: Pt brought in via EMS from home for seizure like
activity. Family states pt seized approx 1 hour prior to EMS. EMS
reports pt seizing on arrival.

EXAM:
CT HEAD WITHOUT CONTRAST
TECHNIQUE: Contiguous axial images were obtained from the base of the skull
through the vertex without intravenous contrast.

[Series 2: head bone · axial · 0.45mm/px · z∈[+512,+544]mm · 3 of 80 slices shown]
[im 8/80  bone]
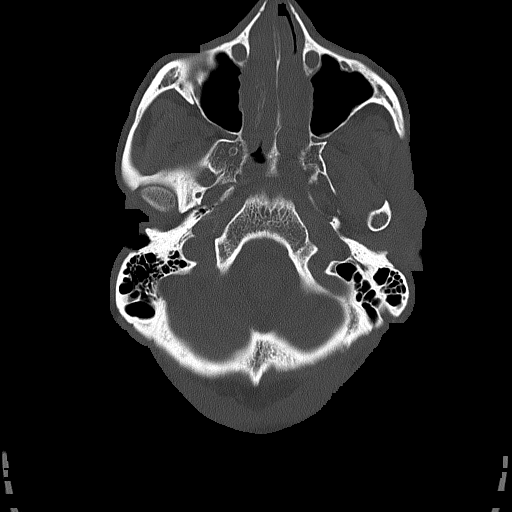
[im 16/80  bone]
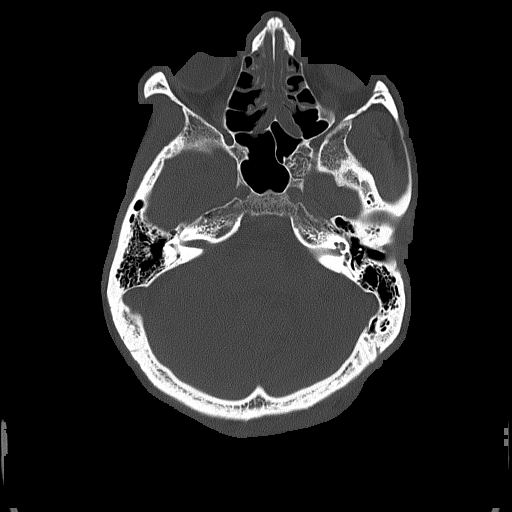
[im 24/80  bone]
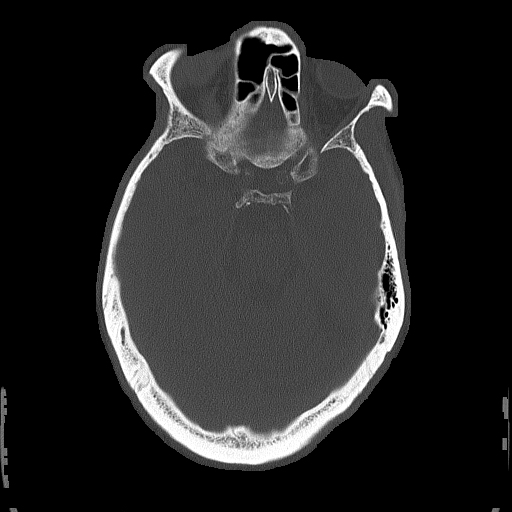

[Series 3: head wo · axial · 0.45mm/px · z∈[+513,+633]mm · 7 of 32 slices shown, 9 images]
[im 4/32  brain]
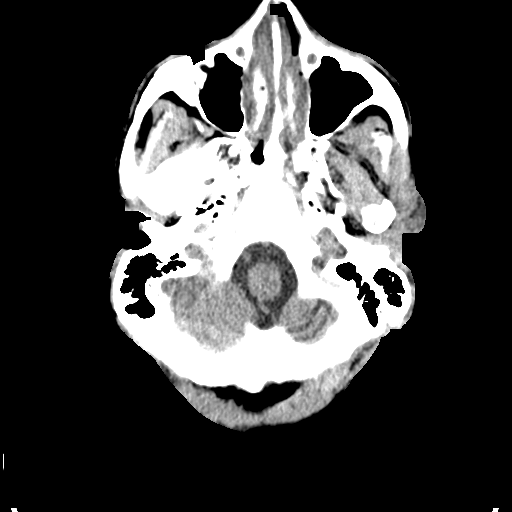
[im 4/32  bone]
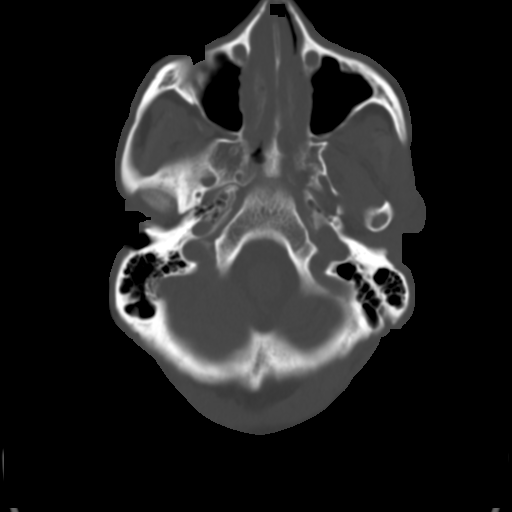
[im 8/32  brain]
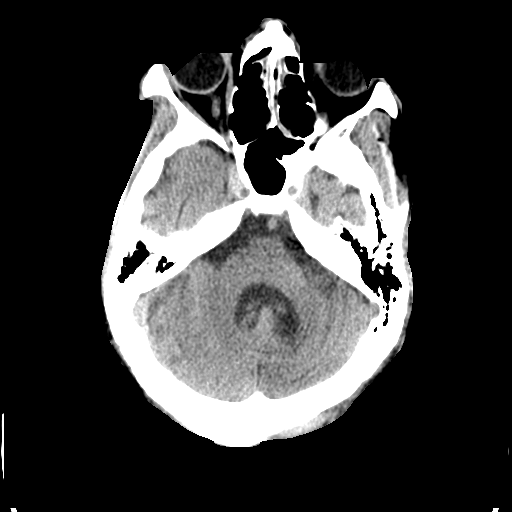
[im 12/32  brain]
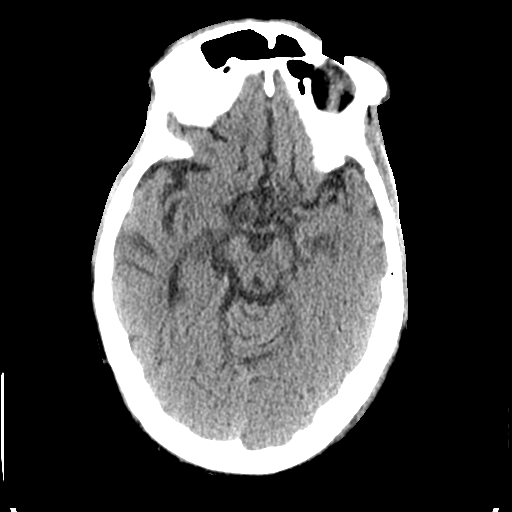
[im 16/32  brain]
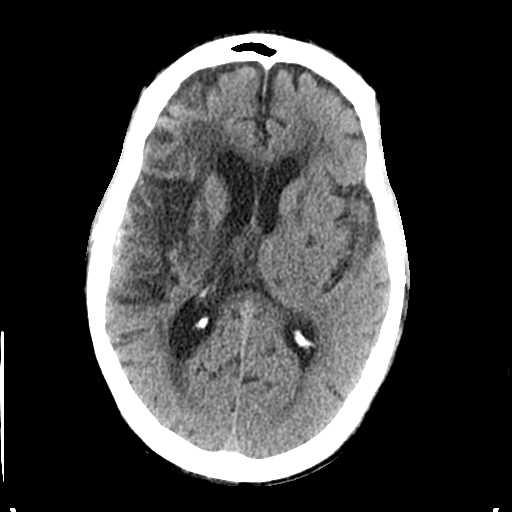
[im 20/32  brain]
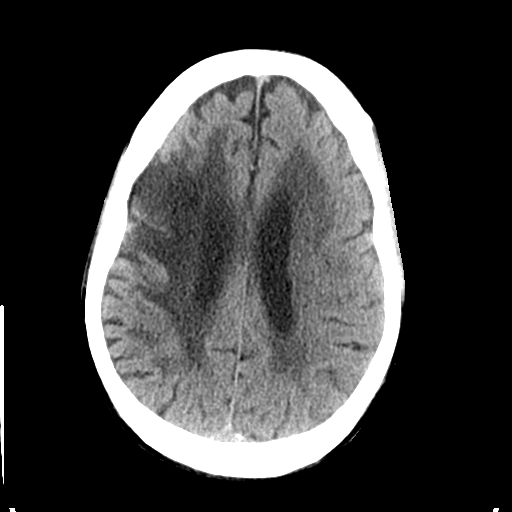
[im 20/32  bone]
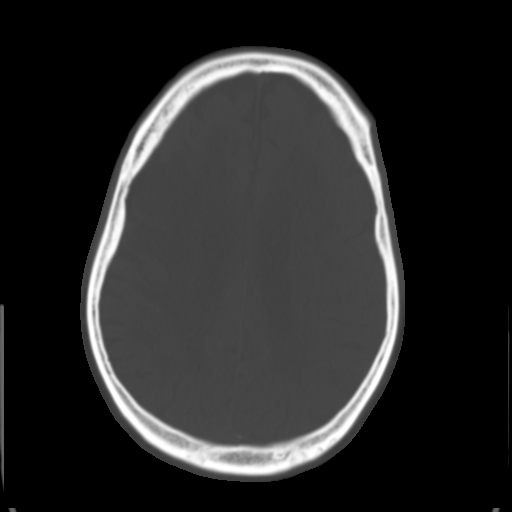
[im 24/32  brain]
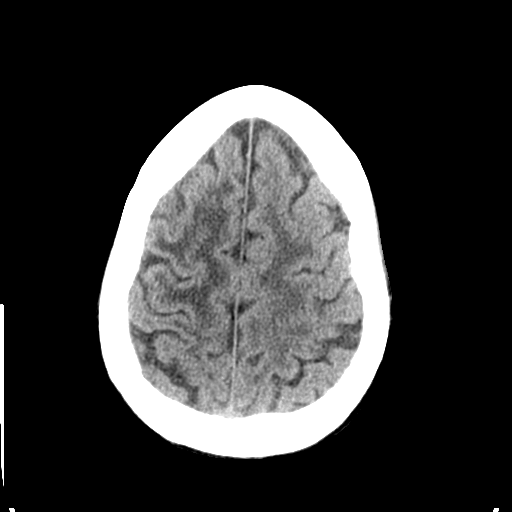
[im 28/32  brain]
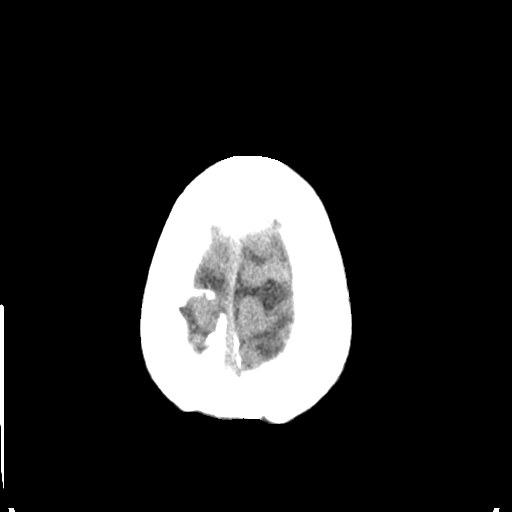

[Series 4: coronal soft tissue · coronal · 0.34mm/px · 3 of 70 slices shown]
[im 24/70  brain]
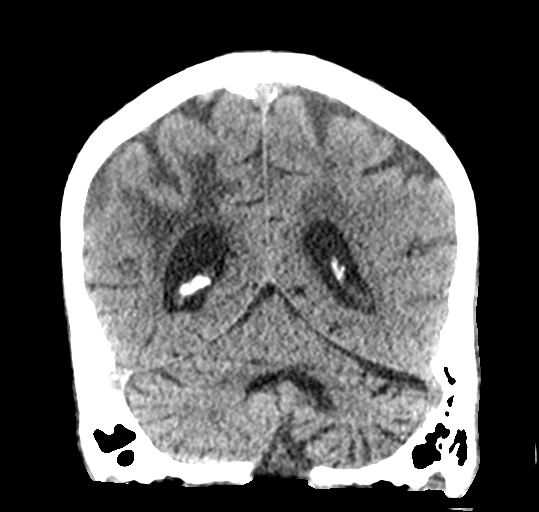
[im 31/70  brain]
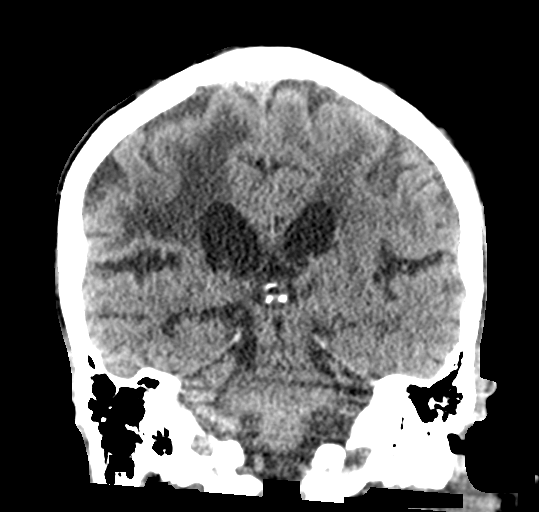
[im 39/70  brain]
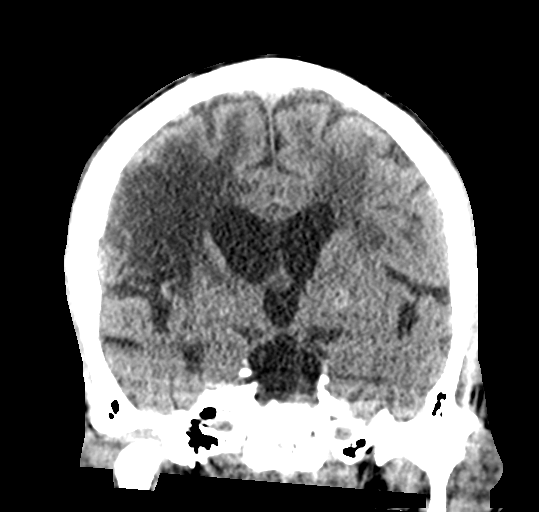

[Series 5: sagittal soft tissue · sagittal · 0.34mm/px · 3 of 51 slices shown]
[im 17/51  brain]
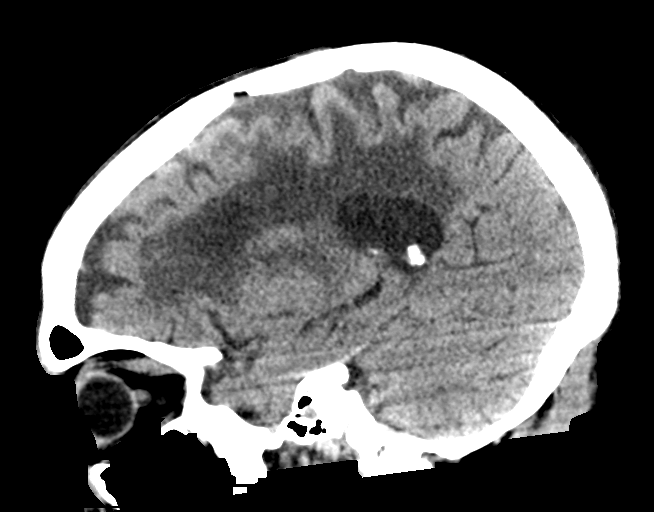
[im 26/51  brain]
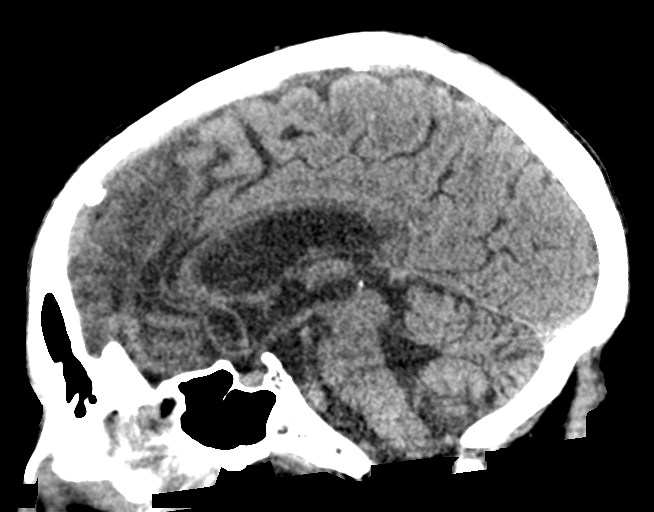
[im 34/51  brain]
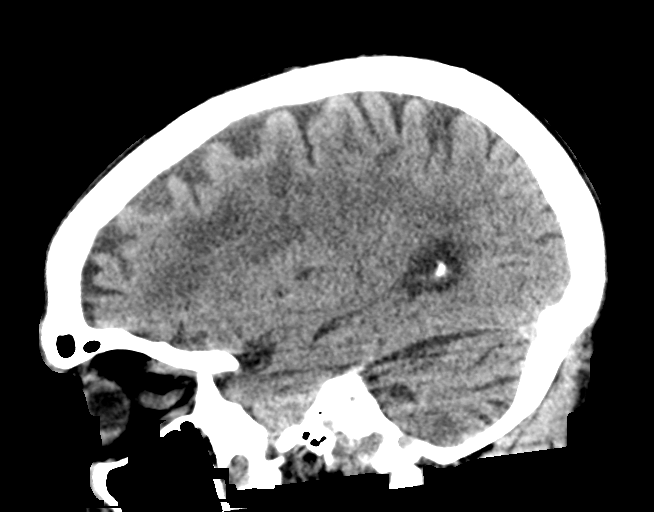

[16 of 47 positions shown; findings below may reference images not displayed]

FINDINGS: Brain: No evidence of acute infarction, hemorrhage, hydrocephalus,
extra-axial collection or mass lesion/mass effect.

Encephalomalacia walls lateral right frontal lobe and right basal
and external capsule/insular ribbon consistent with an old infarct,
stable from the prior head CT. Mild ex vacuo dilation of the right
lateral ventricle. Small old lacunar infarct in the left basal
ganglia. Additional areas of patchy white matter hypoattenuation
also noted in stable consistent moderate chronic microvascular
ischemic change.

Vascular: No hyperdense vessel or unexpected calcification.

Skull: Normal. Negative for fracture or focal lesion.

Sinuses/Orbits: Globes and orbits are unremarkable. Mild ethmoid and
minor inferior frontal maxillary sinus mucosal thickening.

Other: None.
IMPRESSION: 1. No acute intracranial abnormalities.
2. Old right MCA distribution infarct, small left basal gangliar
lacunar infarct chronic microvascular ischemic change stable from
prior head CT.

## 2021-05-17 NOTE — Progress Notes (Signed)
Patient ID: Darin Hopkins, male    DOB: May 08, 1961, 61 y.o.   MRN: [id number]  HPI  [name] is a 61 y/o male with a history of HTN, COPD, seizures, previous tobacco/ alcohol use and chronic heart failure.   Echo report from 08/18/20 reviewed and showed an EF of 60-65% along with mild LVH and moderate AS.  Admitted 11/25/20 due to worsening shortness of breath. Drinks a lot of fluids and is unsure of how much. Placed on bipap with improvement of symptoms & then weaned off to nasal cannula. Coreg decreased due to bradycardia. Discharged after 2 days.   He presents today for a follow-up visit with a chief complaint of minimal fatigue upon moderate exertion. He describes this as chronic in nature having been present for several years. He has no other symptoms and specifically denies any dizziness, difficulty sleeping, abdominal distention, palpitations, pedal edema, chest pain, shortness of breath, cough, fatigue or weight gain.   Wears his oxygen at 3.5 L at home but 2L when out so that his tank doesn't run out. Needs f/u appointment scheduled with pulmonology.   Past Medical History:  Diagnosis Date   CHF (congestive heart failure) (HCC)    COPD (chronic obstructive pulmonary disease) (HCC)    Hypertension    Seizures (HCC)    No past surgical history on file. No family history on file. Social History   Tobacco Use   Smoking status: Former    Packs/day: 2.00    Types: Cigarettes   Smokeless tobacco: Never  Substance Use Topics   Alcohol use: Yes    Alcohol/week: 3.0 standard drinks    Types: 3 Cans of beer per week    Comment: daily   No Known Allergies  Prior to Admission medications   Medication Sig Start Date End Date Taking? Authorizing Provider  amLODipine (NORVASC) 10 MG tablet Take 10 mg by mouth every evening. 07/28/20  Yes [provider]  aspirin 81 MG EC tablet Take 81 mg by mouth in the morning.   Yes [provider]  atorvastatin (LIPITOR) 20  MG tablet Take 20 mg by mouth every evening. 07/28/20  Yes [provider]  bisoprolol (ZEBETA) 5 MG tablet One twice daily 01/16/21  Yes Nyoka CowdenWert, Michael B, MD  Cholecalciferol (VITAMIN D-3) 25 MCG (1000 UT) CAPS Take 2 capsules (2,000 Units total) by mouth in the morning. 03/05/21  Yes Clarisa KindredHackney, Arlana Canizales A, FNP  doxazosin (CARDURA) 2 MG tablet Take 2 mg by mouth every evening. 07/28/20  Yes [provider]  folic acid (FOLVITE) 1 MG tablet Take 1 mg by mouth daily. 04/17/20  Yes [provider]  furosemide (LASIX) 40 MG tablet Take 40 mg by mouth daily. 11/17/20  Yes [provider]  hydrALAZINE (APRESOLINE) 50 MG tablet Take 0.5 tablets (25 mg total) by mouth 3 (three) times daily. 03/05/21 06/03/21 Yes Shametra Cumberland, Jarold Songina A, FNP  levETIRAcetam (KEPPRA) 750 MG tablet Take 1 tablet (750 mg total) by mouth 2 (two) times daily. 09/12/20  Yes Pokhrel, Laxman, MD  mometasone-formoterol (DULERA) 200-5 MCG/ACT AERO Inhale 2 puffs into the lungs in the morning and at bedtime. 03/05/21  Yes Clarisa KindredHackney, Brittanya Winburn A, FNP  Multiple Vitamin (MULTIVITAMIN ADULT) TABS Take 2 tablets by mouth daily.   Yes [provider]  omeprazole (PRILOSEC) 20 MG capsule Take 20 mg by mouth daily before breakfast. 07/28/20  Yes [provider]  potassium chloride (KLOR-CON) 10 MEQ tablet Take 10 mEq by mouth daily.  11/17/20  Yes [provider]  PROAIR HFA 108 (90 Base) MCG/ACT inhaler Inhale 2 puffs into the lungs every 6 (six) hours as needed for shortness of breath or wheezing. 07/28/20  Yes [provider]  thiamine 100 MG tablet Take 100 mg by mouth daily. 11/17/20  Yes [provider]  Tiotropium Bromide-Olodaterol (STIOLTO RESPIMAT) 2.5-2.5 MCG/ACT AERS Inhale 2 puffs into the lungs daily. 01/16/21  Yes Nyoka Cowden, MD  traZODone (DESYREL) 50 MG tablet Take 1 tablet (50 mg total) by mouth at bedtime. 03/05/21  Yes Delma Freeze, FNP   Review of Systems  Constitutional:   Positive for fatigue. Negative for appetite change.  HENT:  Negative for congestion, postnasal drip and sore throat.   Eyes: Negative.   Respiratory:  Negative for cough, chest tightness and shortness of breath.   Cardiovascular:  Negative for chest pain, palpitations and leg swelling.  Gastrointestinal:  Negative for abdominal distention and abdominal pain.  Endocrine: Negative.   Genitourinary: Negative.  Negative for scrotal swelling.  Musculoskeletal:  Negative for back pain and neck pain.  Skin: Negative.   Allergic/Immunologic: Negative.   Neurological:  Negative for dizziness and light-headedness.  Hematological:  Negative for adenopathy. Does not bruise/bleed easily.  Psychiatric/Behavioral:  Negative for dysphoric mood and sleep disturbance (sleeping on 2 pillows with oxygen). The patient is not nervous/anxious.    Vitals:   05/19/21 1457  BP: (!) 151/91  Pulse: 74  Resp: 18  SpO2: 91%  Weight: 132 lb 4 oz (60 kg)  Height: 5\' 7"  (1.702 m)   Wt Readings from Last 3 Encounters:  05/19/21 132 lb 4 oz (60 kg)  04/07/21 127 lb (57.6 kg)  03/05/21 133 lb 6 oz (60.5 kg)   Lab Results  Component Value Date   CREATININE 1.47 (H) 11/27/2020   CREATININE 1.57 (H) 11/26/2020   CREATININE 1.59 (H) 11/25/2020   Physical Exam Vitals and nursing note reviewed. Exam conducted with a chaperone present (sister).  Constitutional:      Appearance: Normal appearance.  HENT:     Head: Normocephalic and atraumatic.  Cardiovascular:     Rate and Rhythm: Normal rate and regular rhythm.  Pulmonary:     Effort: Pulmonary effort is normal.     Breath sounds: No wheezing or rales.  Abdominal:     General: There is no distension.     Palpations: Abdomen is soft.     Tenderness: There is no abdominal tenderness.  Musculoskeletal:        General: No tenderness.     Cervical back: Normal range of motion and neck supple.     Right lower leg: No edema.     Left lower leg: No edema.   Skin:    General: Skin is warm and dry.  Neurological:     Mental Status: He is alert and oriented to person, place, and time. Mental status is at baseline.  Psychiatric:        Mood and Affect: Mood normal.        Behavior: Behavior normal.   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)- - NYHA class II - euvolemic today - weighing daily; reminded to call for an overnight weight gain of > 2 pounds or a weekly weight gain of > 5 pounds - weight stable from last visit here 2 months ago - rarely adding salt and his sister has been reading food labels for  sodium content - will get echo done in April to recheck heart function and adjust meds at that point - needs to get established with cardiology but will defer for now - participating in paramedicine program  - BNP 11/25/20 was 575.8 - he does not take the flu vaccine  2: HTN- - BP initially elevated (151/91) with recheck of 130/80 - follows with [facility]  - BMP 11/27/20 reviewed and showed sodium 143, potassium 4.3, creatinine 1.47 and GFR 54  3: COPD- - wearing oxygen at 3.5L around the clock although he decreases it to 2L when he's out so that it will last longer - saw pulmonology Sherene Sires) 01/16/21; patient missed a f/u appointment with him on 03/03/21  - have scheduled appt with pulmonology for 06/02/21; emphasized that they not be late to this appt - continues to not smoke    Patient did not bring his medications nor a list. Each medication was verbally reviewed with the patient and he was encouraged to bring the bottles to every visit to confirm accuracy of list.   Return in 3 months, sooner if needed for questions/problems.

## 2021-05-19 ENCOUNTER — Encounter: Payer: Self-pay | Admitting: Family

## 2021-05-19 ENCOUNTER — Other Ambulatory Visit: Payer: Self-pay

## 2021-05-19 ENCOUNTER — Ambulatory Visit: Payer: Medicaid Other | Attending: Family | Admitting: Family

## 2021-05-19 VITALS — BP 151/91 | HR 74 | Resp 18 | Ht 67.0 in | Wt 132.2 lb

## 2021-05-19 DIAGNOSIS — I110 Hypertensive heart disease with heart failure: Secondary | ICD-10-CM | POA: Diagnosis not present

## 2021-05-19 DIAGNOSIS — I10 Essential (primary) hypertension: Secondary | ICD-10-CM | POA: Diagnosis not present

## 2021-05-19 DIAGNOSIS — R569 Unspecified convulsions: Secondary | ICD-10-CM | POA: Diagnosis not present

## 2021-05-19 DIAGNOSIS — F1090 Alcohol use, unspecified, uncomplicated: Secondary | ICD-10-CM | POA: Insufficient documentation

## 2021-05-19 DIAGNOSIS — J449 Chronic obstructive pulmonary disease, unspecified: Secondary | ICD-10-CM | POA: Insufficient documentation

## 2021-05-19 DIAGNOSIS — I5032 Chronic diastolic (congestive) heart failure: Secondary | ICD-10-CM | POA: Diagnosis not present

## 2021-05-19 DIAGNOSIS — J431 Panlobular emphysema: Secondary | ICD-10-CM | POA: Diagnosis not present

## 2021-05-19 DIAGNOSIS — Z79899 Other long term (current) drug therapy: Secondary | ICD-10-CM | POA: Diagnosis not present

## 2021-05-19 DIAGNOSIS — Z87891 Personal history of nicotine dependence: Secondary | ICD-10-CM | POA: Insufficient documentation

## 2021-05-19 DIAGNOSIS — Z9981 Dependence on supplemental oxygen: Secondary | ICD-10-CM | POA: Insufficient documentation

## 2021-05-19 NOTE — Patient Instructions (Signed)
Continue weighing daily and call for an overnight weight gain of 3 pounds or more or a weekly weight gain of more than 5 pounds.  °

## 2021-06-02 ENCOUNTER — Ambulatory Visit: Payer: Medicaid Other | Admitting: Internal Medicine

## 2021-06-04 IMAGING — CR DG CHEST 2V
1 series · 2 of 2 positions shown · non-contrast
Comparison: September 10, 2020.

CLINICAL DATA: COPD.

EXAM:
CHEST - 2 VIEW

[Series 2: w chest pa · 0.14mm/px · 2 of 2 slices shown]
[im 1/2]
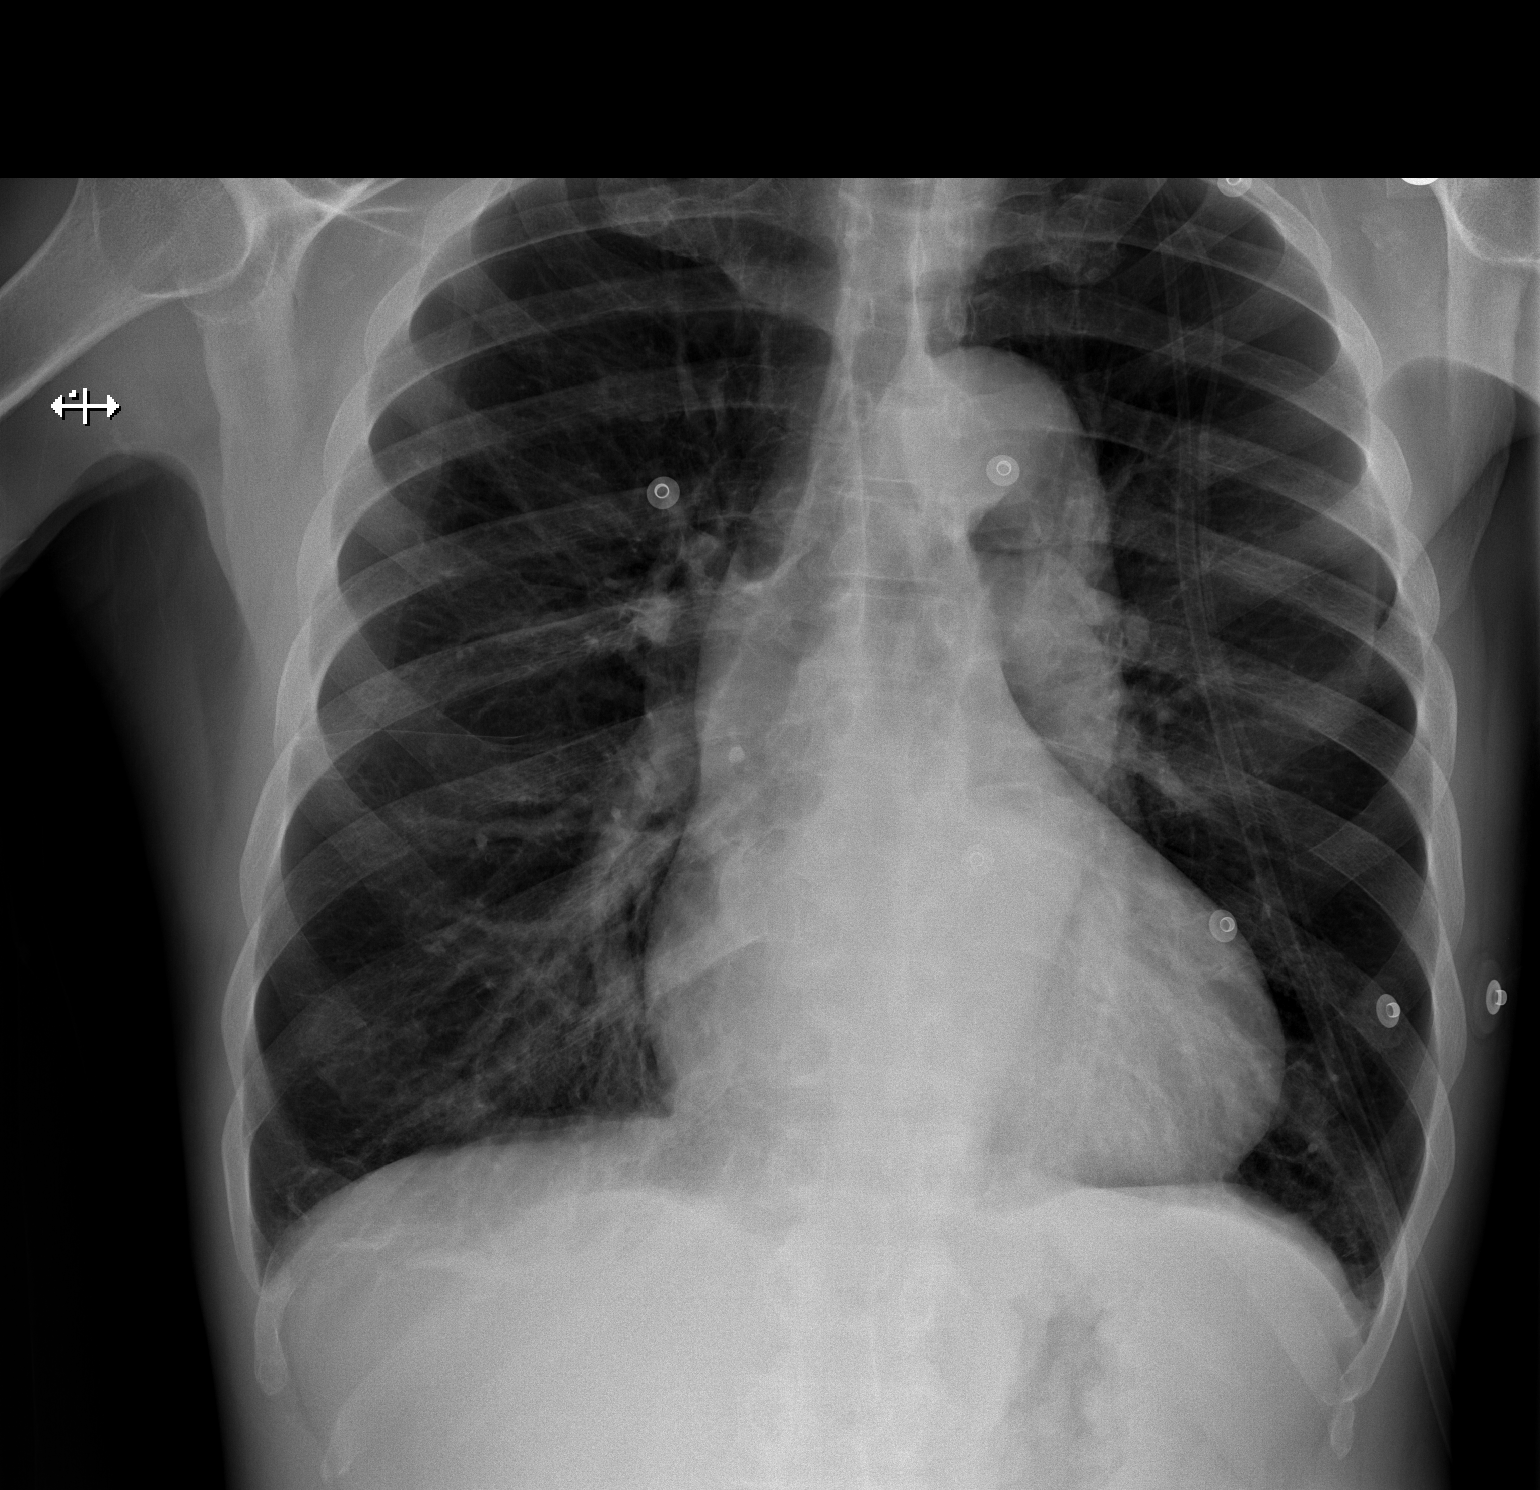
[im 2/2]
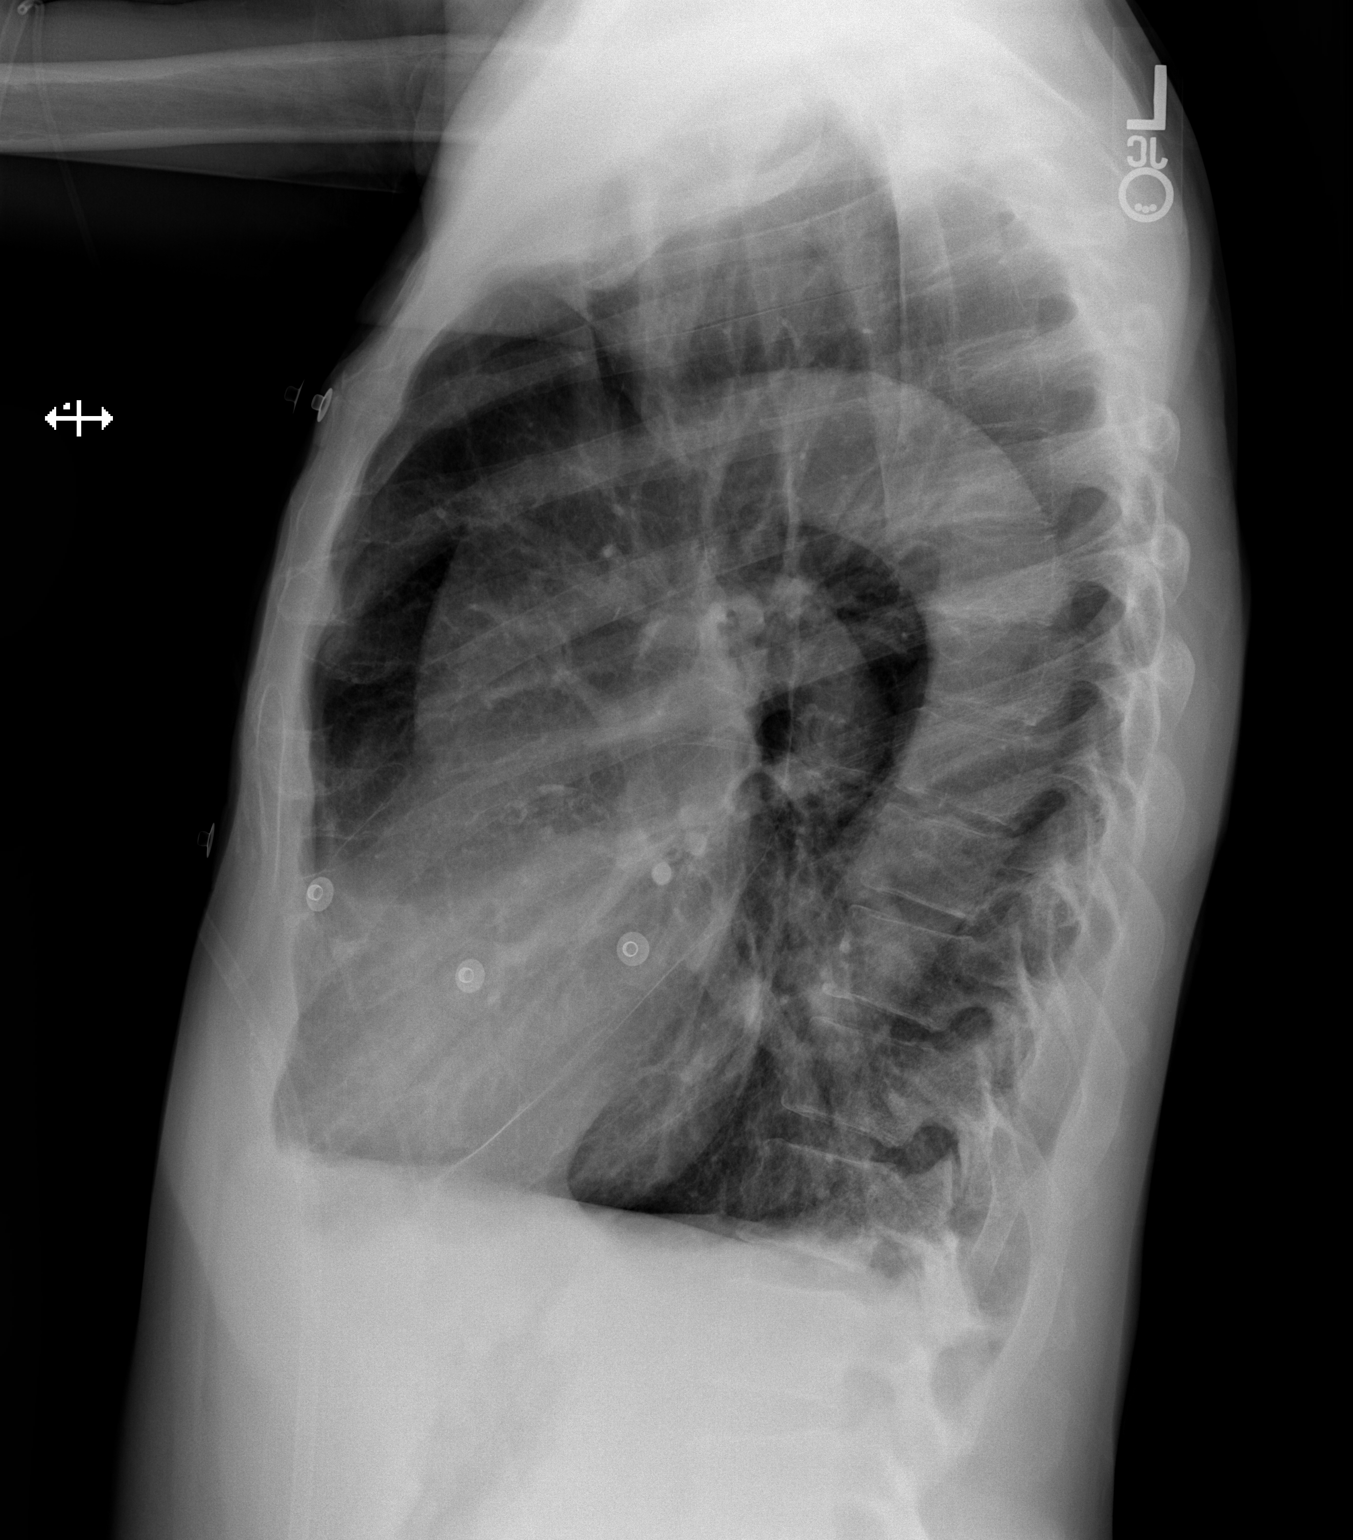

[2 of 2 positions shown; findings below may reference images not displayed]

FINDINGS: Similar cardiomediastinal silhouette. Tortuous aorta. Both lungs are
clear. Chronic hyperinflation. No visible pleural effusions or
pneumothorax. Left chest skin fold. No acute osseous abnormality.
IMPRESSION: 1. No active cardiopulmonary disease.
2. Chronic hyperinflation.

## 2021-06-08 ENCOUNTER — Other Ambulatory Visit (HOSPITAL_COMMUNITY): Payer: Self-pay

## 2021-06-08 NOTE — Progress Notes (Signed)
Today made contact with Darin Hopkins and his sister.  He is doing well and has no complaints.  He has been to his doctors with good checkups.  He is mobile and stays busy at the home.  He denies any chest pain, headaches, dizziness or increased shortness of breath.  He is still on oxygen 24 hrs a day.  Sleep is still bothersome, he goes to bed really early.  His sister is cutting back about how many times she is waking him up during the night.  She does not sleep at night.  He watches his fluids and high sodium foods.  He is aware of up coming appts.  They have everything for daily living.  Will continue to visit for heart failure, diet and mediation compliance.   [name] EMT-Paramedic [phone]

## 2021-07-01 ENCOUNTER — Other Ambulatory Visit (HOSPITAL_COMMUNITY): Payer: Self-pay

## 2021-07-01 ENCOUNTER — Encounter (HOSPITAL_COMMUNITY): Payer: Self-pay

## 2021-07-01 NOTE — Progress Notes (Signed)
Today had a home visit with [facility].  He states been doing well.  He states sleeping all night now, he states sister quit waking him up all night.  He states watching his fluids and high sodium foods.  He denies any chest pain, headaches, dizziness or increased shortness of breath.  He states breathing appears to be getting better with activity.  Lungs clear.  No edema in lower extremities and abdomen is soft.  He has all his medications and his sister takes care of giving them to him.  She is aware of what he takes and how.  He lives with his sister and husband.  Will continue to visit for heart failure, diet and medication management.   [employer] [phone]

## 2021-08-04 ENCOUNTER — Telehealth (HOSPITAL_COMMUNITY): Payer: Self-pay

## 2021-08-04 NOTE — Telephone Encounter (Signed)
Set up a home visit for tomorrow.  ? ?Darin Hopkins ?[place] EMT-Paramedic ?[phone] ?

## 2021-08-05 ENCOUNTER — Encounter (HOSPITAL_COMMUNITY): Payer: Self-pay

## 2021-08-05 ENCOUNTER — Other Ambulatory Visit (HOSPITAL_COMMUNITY): Payer: Self-pay

## 2021-08-05 NOTE — Progress Notes (Signed)
Today had a home visit with [place] and his sister.  He states been feeling good.  Getting out more.  He has hopes of coming off oxygen.  He has it down on 2 lpm when goes off and on 3 lpm at home moving around.  He has pulmonology appt in a couple of months.  He states been sleeping ok, appetite is good.  He watches high sodium foods nad how much fluid he intakes daily.  He states weighing daily and weight pretty much staying the same.  His sister gives him his medications and aware of what he takes.  He lives with his sister and husband.  He denies any chest pain, headaches, dizziness or increased shortness of breath.  They have everything needed for daily living.  Lungs are clear, little diminished in lower lobes.  No edema noted and abdomen is soft. Will continue to visit for heart failure, medication compliance and diet.  ? ?Estephanie Hubbs ?[place] EMT-Paramedic ?[phone] ?

## 2021-08-15 NOTE — Progress Notes (Deleted)
? Patient ID: Darin Hopkins, male    DOB: 26-Sep-1960, 61 y.o.   MRN: [id number] ? ?HPI ? ?Darin Hopkins is a 61 y/o male with a history of HTN, COPD, seizures, previous tobacco/ alcohol use and chronic heart failure.  ? ?Echo report from 08/18/20 reviewed and showed an EF of 60-65% along with mild LVH and moderate AS. ? ?Has not been admitted or been in the ED in the last 6 months.  ? ?He presents today for a follow-up visit with a chief complaint of  ? ?Past Medical History:  ?Diagnosis Date  ? CHF (congestive heart failure) (HCC)   ? COPD (chronic obstructive pulmonary disease) (HCC)   ? Hypertension   ? Seizures (HCC)   ? ?No past surgical history on file. ?No family history on file. ?Social History  ? ?Tobacco Use  ? Smoking status: Former  ?  Packs/day: 2.00  ?  Types: Cigarettes  ? Smokeless tobacco: Never  ?Substance Use Topics  ? Alcohol use: Yes  ?  Alcohol/week: 3.0 standard drinks  ?  Types: 3 Cans of beer per week  ?  Comment: daily  ? ?No Known Allergies ? ? ?Review of Systems  ?Constitutional:  Positive for fatigue. Negative for appetite change.  ?HENT:  Negative for congestion, postnasal drip and sore throat.   ?Eyes: Negative.   ?Respiratory:  Negative for cough, chest tightness and shortness of breath.   ?Cardiovascular:  Negative for chest pain, palpitations and leg swelling.  ?Gastrointestinal:  Negative for abdominal distention and abdominal pain.  ?Endocrine: Negative.   ?Genitourinary: Negative.  Negative for scrotal swelling.  ?Musculoskeletal:  Negative for back pain and neck pain.  ?Skin: Negative.   ?Allergic/Immunologic: Negative.   ?Neurological:  Negative for dizziness and light-headedness.  ?Hematological:  Negative for adenopathy. Does not bruise/bleed easily.  ?Psychiatric/Behavioral:  Negative for dysphoric mood and sleep disturbance (sleeping on 2 pillows with oxygen). The patient is not nervous/anxious.   ? ? ? ?Physical Exam ?Vitals and nursing note reviewed. Exam conducted with a  chaperone present (sister).  ?Constitutional:   ?   Appearance: Normal appearance.  ?HENT:  ?   Head: Normocephalic and atraumatic.  ?Cardiovascular:  ?   Rate and Rhythm: Normal rate and regular rhythm.  ?Pulmonary:  ?   Effort: Pulmonary effort is normal.  ?   Breath sounds: No wheezing or rales.  ?Abdominal:  ?   General: There is no distension.  ?   Palpations: Abdomen is soft.  ?   Tenderness: There is no abdominal tenderness.  ?Musculoskeletal:     ?   General: No tenderness.  ?   Cervical back: Normal range of motion and neck supple.  ?   Right lower leg: No edema.  ?   Left lower leg: No edema.  ?Skin: ?   General: Skin is warm and dry.  ?Neurological:  ?   Mental Status: He is alert and oriented to person, place, and time. Mental status is at baseline.  ?Psychiatric:     ?   Mood and Affect: Mood normal.     ?   Behavior: Behavior normal.  ? ?Assessment & Plan: ? 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 ?2: HTN- ?- BP  ?- follows with [facility]  ?- BMP 11/27/20 reviewed and showed sodium 143, potassium 4.3, creatinine 1.47 and GFR 54 ? ?3: COPD- ?- wearing oxygen at 3.5L around the clock although he decreases it to 2L when he's out so that it will last longer ?- saw pulmonology Darin Hopkins) 01/16/21; patient missed a f/u appointment with him on 03/03/21  ?- NS for pulmonology appt on 06/02/21 ?- continues to not smoke   ? ? ?Patient did not bring his medications nor a list. Each medication was verbally reviewed with the patient and he was encouraged to bring the bottles to every visit to confirm accuracy of list.  ? ? ? ? ? ? ?

## 2021-08-17 ENCOUNTER — Ambulatory Visit: Payer: Medicaid Other

## 2021-08-17 ENCOUNTER — Ambulatory Visit: Payer: Medicaid Other | Admitting: Family

## 2021-08-17 ENCOUNTER — Telehealth: Payer: Self-pay | Admitting: Family

## 2021-08-17 NOTE — Telephone Encounter (Signed)
Called patient once on 4/7 and twice 4/10 to attempt to remind patient of his Echo and appointment right after with the [facility] for 4/10 but have been unable to reach or LVM for patient. ? ? ?Darin Hopkins, NT ?

## 2021-09-01 ENCOUNTER — Ambulatory Visit: Payer: Medicaid Other | Attending: Family | Admitting: Family

## 2021-09-01 ENCOUNTER — Other Ambulatory Visit (HOSPITAL_COMMUNITY): Payer: Self-pay

## 2021-09-01 ENCOUNTER — Encounter: Payer: Self-pay | Admitting: Family

## 2021-09-01 VITALS — BP 132/90 | HR 70 | Resp 16 | Ht 67.0 in | Wt 140.0 lb

## 2021-09-01 DIAGNOSIS — Z9981 Dependence on supplemental oxygen: Secondary | ICD-10-CM | POA: Insufficient documentation

## 2021-09-01 DIAGNOSIS — Z87891 Personal history of nicotine dependence: Secondary | ICD-10-CM | POA: Insufficient documentation

## 2021-09-01 DIAGNOSIS — I5032 Chronic diastolic (congestive) heart failure: Secondary | ICD-10-CM | POA: Insufficient documentation

## 2021-09-01 DIAGNOSIS — I10 Essential (primary) hypertension: Secondary | ICD-10-CM | POA: Diagnosis not present

## 2021-09-01 DIAGNOSIS — J431 Panlobular emphysema: Secondary | ICD-10-CM

## 2021-09-01 DIAGNOSIS — R569 Unspecified convulsions: Secondary | ICD-10-CM | POA: Insufficient documentation

## 2021-09-01 DIAGNOSIS — J449 Chronic obstructive pulmonary disease, unspecified: Secondary | ICD-10-CM | POA: Diagnosis not present

## 2021-09-01 DIAGNOSIS — I110 Hypertensive heart disease with heart failure: Secondary | ICD-10-CM | POA: Insufficient documentation

## 2021-09-01 NOTE — Progress Notes (Signed)
? Patient ID: Darin Hopkins, male    DOB: [birth year], 61 y.o.   MRN: [id number] ? ?HPI ? ?Darin Hopkins is a 61 y/o male with a history of HTN, COPD, seizures, previous tobacco/ alcohol use and chronic heart failure.  ? ?Echo report from 08/18/20 reviewed and showed an EF of 60-65% along with mild LVH and moderate AS. ? ?Has not been admitted or been in the ED in the last 6 months.  ? ?Darin Hopkins presents today for a follow-up visit with a chief complaint of minimal shortness of breath with moderate exertion. Darin Hopkins describes this as chronic in nature. Darin Hopkins has associated fatigue along with this. Denies any difficulty sleeping, dizziness, abdominal distention, palpitations, pedal edema, chest pain, cough or weight gain.  ? ?Meds are currently bubblepacked through his pharmacy. Needs his pulmonary appt r/s.  ? ?Past Medical History:  ?Diagnosis Date  ? CHF (congestive heart failure) (HCC)   ? COPD (chronic obstructive pulmonary disease) (HCC)   ? Hypertension   ? Seizures (HCC)   ? ?No past surgical history on file. ?No family history on file. ?Social History  ? ?Tobacco Use  ? Smoking status: Former  ?  Packs/day: 2.00  ?  Types: Cigarettes  ? Smokeless tobacco: Never  ?Substance Use Topics  ? Alcohol use: Yes  ?  Alcohol/week: 3.0 standard drinks  ?  Types: 3 Cans of beer per week  ?  Comment: daily  ? ?No Known Allergies ? ?Prior to Admission medications   ?Medication Sig Start Date End Date Taking? Authorizing Provider  ?amLODipine (NORVASC) 10 MG tablet Take 10 mg by mouth every evening. 07/28/20  Yes [provider]  ?aspirin 81 MG EC tablet Take 81 mg by mouth in the morning.   Yes [provider]  ?atorvastatin (LIPITOR) 20 MG tablet Take 20 mg by mouth every evening. 07/28/20  Yes [provider]  ?bisoprolol (ZEBETA) 5 MG tablet One twice daily 01/16/21  Yes Nyoka CowdenWert, Michael B, MD  ?Cholecalciferol (VITAMIN D-3) 25 MCG (1000 UT) CAPS Take 2 capsules (2,000 Units total) by mouth in the morning. 03/05/21   Yes Delma FreezeHackney, Cay[place] Kielbasa A, FNP  ?doxazosin (CARDURA) 2 MG tablet Take 2 mg by mouth every evening. 07/28/20  Yes [provider]  ?folic acid (FOLVITE) 1 MG tablet Take 1 mg by mouth daily. 04/17/20  Yes [provider]  ?furosemide (LASIX) 40 MG tablet Take 40 mg by mouth daily. 11/17/20  Yes [provider]  ?hydrALAZINE (APRESOLINE) 50 MG tablet Take 0.5 tablets (25 mg total) by mouth 3 (three) times daily. 03/05/21  Yes Delma FreezeHackney, Elody Kleinsasser A, FNP  ?levETIRAcetam (KEPPRA) 750 MG tablet Take 1 tablet (750 mg total) by mouth 2 (two) times daily. 09/12/20  Yes Pokhrel, Laxman, MD  ?mometasone-formoterol (DULERA) 200-5 MCG/ACT AERO Inhale 2 puffs into the lungs in the morning and at bedtime. 03/05/21  Yes Delma FreezeHackney, Nanea Jared A, FNP  ?Multiple Vitamin (MULTIVITAMIN ADULT) TABS Take 2 tablets by mouth daily.   Yes [provider]  ?omeprazole (PRILOSEC) 20 MG capsule Take 20 mg by mouth daily before breakfast. 07/28/20  Yes [provider]  ?potassium chloride (KLOR-CON) 10 MEQ tablet Take 10 mEq by mouth daily. 11/17/20  Yes [provider]  ?PROAIR HFA 108 (90 Base) MCG/ACT inhaler Inhale 2 puffs into the lungs every 6 (six) hours as needed for shortness of breath or wheezing. 07/28/20  Yes [provider]  ?thiamine 100 MG tablet Take 100 mg by mouth daily.  11/17/20  Yes [provider]  ?Tiotropium Bromide-Olodaterol (STIOLTO RESPIMAT) 2.5-2.5 MCG/ACT AERS Inhale 2 puffs into the lungs daily. 01/16/21  Yes Nyoka Cowden, MD  ?traZODone (DESYREL) 50 MG tablet Take 1 tablet (50 mg total) by mouth at bedtime. 03/05/21  Yes Delma Freeze, FNP  ? ? ?Review of Systems  ?Constitutional:  Positive for fatigue. Negative for appetite change.  ?HENT:  Negative for congestion, postnasal drip and sore throat.   ?Eyes: Negative.   ?Respiratory:  Positive for shortness of breath (with moderate exertion). Negative for cough and chest tightness.   ?Cardiovascular:  Negative for chest  pain, palpitations and leg swelling.  ?Gastrointestinal:  Negative for abdominal distention and abdominal pain.  ?Endocrine: Negative.   ?Genitourinary: Negative.  Negative for scrotal swelling.  ?Musculoskeletal:  Negative for back pain and neck pain.  ?Skin: Negative.   ?Allergic/Immunologic: Negative.   ?Neurological:  Negative for dizziness and light-headedness.  ?Hematological:  Negative for adenopathy. Does not bruise/bleed easily.  ?Psychiatric/Behavioral:  Negative for dysphoric mood and sleep disturbance (sleeping on 2 pillows with oxygen). The patient is not nervous/anxious.   ? ?Vitals:  ? 09/01/21 1247  ?BP: 132/90  ?Pulse: 70  ?Resp: 16  ?SpO2: 92%  ?Weight: 140 lb (63.5 kg)  ?Height: 5\' 7"  (1.702 m)  ? ?Wt Readings from Last 3 Encounters:  ?09/01/21 140 lb (63.5 kg)  ?08/05/21 127 lb (57.6 kg)  ?07/01/21 132 lb (59.9 kg)  ? ?Lab Results  ?Component Value Date  ? CREATININE 1.47 (H) 11/27/2020  ? CREATININE 1.57 (H) 11/26/2020  ? CREATININE 1.59 (H) 11/25/2020  ? ?Physical Exam ?Vitals and nursing note reviewed. Exam conducted with a chaperone present (sister & paramedic).  ?Constitutional:   ?   Appearance: Normal appearance.  ?HENT:  ?   Head: Normocephalic and atraumatic.  ?Cardiovascular:  ?   Rate and Rhythm: Normal rate and regular rhythm.  ?Pulmonary:  ?   Effort: Pulmonary effort is normal.  ?   Breath sounds: No wheezing or rales.  ?Abdominal:  ?   General: There is no distension.  ?   Palpations: Abdomen is soft.  ?   Tenderness: There is no abdominal tenderness.  ?Musculoskeletal:     ?   General: No tenderness.  ?   Cervical back: Normal range of motion and neck supple.  ?   Right lower leg: No edema.  ?   Left lower leg: No edema.  ?Skin: ?   General: Skin is warm and dry.  ?Neurological:  ?   Mental Status: Darin Hopkins is alert and oriented to person, place, and time. Mental status is at baseline.  ?Psychiatric:     ?   Mood and Affect: Mood normal.     ?   Behavior: Behavior normal.  ? ?Assessment  & Plan: ? 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 ?2: HTN- ?- BP mildly elevated (132/90) ?- follows with [facility]  ?- BMP 11/27/20 reviewed and showed sodium 143, potassium 4.3, creatinine 1.47 and GFR 54 ? ?3: COPD- ?- wearing oxygen at 3.5L around the clock at home although Darin Hopkins decreases it to 2L when Darin Hopkins's out so that it will last longer ?- saw pulmonology Sherene Sires) 01/16/21 ?- NS for pulmonology appt on 06/02/21; this has been r/s for 10/01/21 ?- continues to not smoke  ? ? ?Medication bubblepacks reviewed.  ? ?Return in 2 months, sooner if needed.  ? ? ? ? ? ? ?

## 2021-09-01 NOTE — Progress Notes (Signed)
Today met with Darin Hopkins and sister at his HF clinic appt.  He states doing well.  Otila Kluver states everything is looking good.  Advised him to wear compression socks on both legs instead of 1 he has only on right leg.  She advised him to make pulmonology appt and reminded him of ECHO appt coming up and importance of seeing him after the appt to discuss results and if there will need to be any medication changes.  She did not change any medications today.  He gained a few lbs but over time.  He lives with his sister which helps him medically.  Will continue to make home visit for education on heart failure, diet and medication management.  ? ?Chaquetta Schlottman ?[place] EMT-Paramedic ?[phone] ?

## 2021-09-01 NOTE — Patient Instructions (Signed)
Continue weighing daily and call for an overnight weight gain of 3 pounds or more or a weekly weight gain of more than 5 pounds.   If you have voicemail, please make sure your mailbox is cleaned out so that we may leave a message and please make sure to listen to any voicemails.     

## 2021-09-30 ENCOUNTER — Telehealth: Payer: Self-pay | Admitting: Family

## 2021-09-30 NOTE — Telephone Encounter (Signed)
Called to Remind patient of his Echo on 5/25 at 11am followed by an appointment with Dr. Sherene Sires( His lung doctor) and I was unable to reach patient or his sister or LVM for either one.   Taysha Majewski, NT

## 2021-10-01 ENCOUNTER — Encounter: Payer: Self-pay | Admitting: Internal Medicine

## 2021-10-01 ENCOUNTER — Ambulatory Visit
Admission: RE | Admit: 2021-10-01 | Discharge: 2021-10-01 | Disposition: A | Discharge status: Home / Self Care | Payer: Medicaid Other | Source: Ambulatory Visit | Attending: Internal Medicine | Admitting: Internal Medicine

## 2021-10-01 ENCOUNTER — Ambulatory Visit: Admission: RE | Admit: 2021-10-01 | Payer: Medicaid Other | Source: Ambulatory Visit

## 2021-10-01 ENCOUNTER — Ambulatory Visit: Payer: Medicaid Other | Admitting: Internal Medicine

## 2021-10-01 DIAGNOSIS — J449 Chronic obstructive pulmonary disease, unspecified: Secondary | ICD-10-CM | POA: Insufficient documentation

## 2021-10-01 DIAGNOSIS — J9612 Chronic respiratory failure with hypercapnia: Secondary | ICD-10-CM

## 2021-10-01 DIAGNOSIS — J9611 Chronic respiratory failure with hypoxia: Secondary | ICD-10-CM

## 2021-10-01 DIAGNOSIS — I10 Essential (primary) hypertension: Secondary | ICD-10-CM

## 2021-10-01 NOTE — Patient Instructions (Addendum)
Work on inhaler technique:  relax and gently blow all the way out then take a nice smooth full deep breath back in, triggering the inhaler at same time you start breathing in.  Hold for up to 5 seconds if you can.   Make sure you check your oxygen saturation  AT  your highest level of activity (not after you stop)   to be sure it stays over 90% and adjust  02 flow upward to maintain this level if needed but remember to turn it back to previous settings when you stop (to conserve your supply).    Please remember to go to the  x-ray department  for your tests - we will call you with the results when they are available    Please schedule a follow up visit in 3 months but call sooner if needed - bring both inhalers for PFTs on return

## 2021-10-01 NOTE — Progress Notes (Unsigned)
Darin Hopkins, male    DOB: [birth year],    MRN: [id number]   Brief patient profile:   61 yobm former smoker stopped summer 2021 with asthma as child with dx of copd around 2019 on daily inhalers and 02 x 2022 with chronic hypercarbic and hypoxemic Resp failure referred to pulmonary clinic in [facility]  01/16/2021 by Elisabeth Cara   s/p admit:   Admit date: 11/25/2020 Discharge date: 11/27/2020      Discharge Diagnoses:  Principal Problem:   Acute hypoxemic respiratory failure (HCC)   Acute exacerbation of CHF (congestive heart failure) (HCC)   Essential hypertension   History of seizure   COPD (chronic obstructive pulmonary disease) ([place])   Hyperlipidemia   GERD (gastroesophageal reflux disease)   Protein-calorie malnutrition, severe   COPD exacerbation ([place])   Chronic kidney disease, stage III (moderate) (HCC)   AKI (acute kidney injury) ([place])     History of present illness:  Darin Hopkins is a 61 y.o. male with medical history significant for hypertension, GERD, history of seizure, last seizure was in May 2022, CKD3, hyperlipidemia, COPD, history of CVA about 2.5 years ago, tobacco use, presents to the ED for chief concern of shortness of breath.    He reports that the shortness of breath started AM prior to hospitalization.  He states that the shortness of breath is worse with exertion.  States he ran out of his inhaler for 1 day. He states he tries to drink a lot of water and he does not know the quantity in which he drinks fluid.  He denies drinking soda, tea, EtOH.  He states specifically that his last EtOH drink was December 2021.   He reports improvement with BiPAP.   Hospital Course:  Patient presented with acute on chronic hypoxic respiratory failure. Treated briefly with bipap, then transitioned to home O2. Initially treated for CHF exacerbation with diuresis, but overall picture more suggestive of copd exacerbation. Treated with prednisone which he will continue.  Patient also had AKI of 1.57 from baseline in low 1s, improved to 1.47 on day of discharge, will need check of kidney function at f/u. Noted to be bradycardic and asymptomatic during his hospital stay, dose of coreg was decreased. Has a hx of seizures, none this hospitalization. Will need close pcp f/u.      History of Present Illness  01/16/2021  Pulmonary/ 1st office eval/ [facility] / [employer]  Chief Complaint  Patient presents with   Consult    Copd- 4L   Dyspnea:  also limited by balance but extremely sedentary - 25 ft max on fixed 02 = 4lpm  Cough: very hoarse, freq throat clearing but min mucoid sputum  Sleep: flat bed, one pillow does ok/ no am exac SABA use: not really helping, very poor technique  Rec Stop lisinopril and the carvedilol  Start bisoprolol 5 mg twice daily in place of both lisinopril and carvedolol. Stop all inhalers except red one (Proair as below) Plan A = Automatic = Always=    start stiolto 2 puff daily first thing in AM  Plan B = Backup (to supplement plan A, not to replace it) Only use your albuterol inhaler - red/Proair as a rescue medication  Make sure you check your oxygen saturation at your highest level of activity  to be sure it stays 88-92% If getting worse on this plan call me or go to the ER if after hours  Please schedule a follow up office visit  in 6 weeks, call sooner if needed - bring all active medications/inhalers    10/01/2021  f/u ov/Terris Germano/ [facility] re: copd GOLD ?    maint on stiolto  Chief Complaint  Patient presents with   Follow-up    Patient feels like breathing is better since last visit. Wears 2 liters when he's out and then 3 liters at home. They would like to talk about cutting back on oxygen.  Dyspnea:  Room to room since cva caused balance problem  Can do grocery shopping leaning on cart @ wm x 2 aisles @ 2lpm but not checking sats  Cough: minimal white improved Sleeping: bed  SABA use: maybe qod  02: 3lpm  sleeping and sitting then 2lpm out  - does not titrate as rec  Covid status:   vax x 2    No obvious day to day or daytime variability or assoc excess/ purulent sputum or mucus plugs or hemoptysis or cp or chest tightness, subjective wheeze or overt sinus or hb symptoms.   Sleeping  without nocturnal  or early am exacerbation  of respiratory  c/o's or need for noct saba. Also denies any obvious fluctuation of symptoms with weather or environmental changes or other aggravating or alleviating factors except as outlined above   No unusual exposure hx or h/o childhood pna/ asthma or knowledge of premature birth.  Current Allergies, Complete Past Medical History, Past Surgical History, Family History, and Social History were reviewed in [employer] record.  ROS  The following are not active complaints unless bolded Hoarseness, sore throat, dysphagia, dental problems, itching, sneezing,  nasal congestion or discharge of excess mucus or purulent secretions, ear ache,   fever, chills, sweats, unintended wt loss or wt gain, classically pleuritic or exertional cp,  orthopnea pnd or arm/hand swelling  or leg swelling, presyncope, palpitations, abdominal pain, anorexia, nausea, vomiting, diarrhea  or change in bowel habits or change in bladder habits, change in stools or change in urine, dysuria, hematuria,  rash, arthralgias, visual complaints, headache, numbness, weakness or ataxia or problems with walking or coordination,  change in mood or  memory.        Current Meds  Medication Sig   amLODipine (NORVASC) 10 MG tablet Take 10 mg by mouth every evening.   aspirin 81 MG EC tablet Take 81 mg by mouth in the morning.   atorvastatin (LIPITOR) 20 MG tablet Take 20 mg by mouth every evening.   bisoprolol (ZEBETA) 5 MG tablet One twice daily   Cholecalciferol (VITAMIN D-3) 25 MCG (1000 UT) CAPS Take 2 capsules (2,000 Units total) by mouth in the morning.   doxazosin (CARDURA) 2 MG  tablet Take 2 mg by mouth every evening.   folic acid (FOLVITE) 1 MG tablet Take 1 mg by mouth daily.   furosemide (LASIX) 40 MG tablet Take 40 mg by mouth daily.   hydrALAZINE (APRESOLINE) 50 MG tablet Take 0.5 tablets (25 mg total) by mouth 3 (three) times daily.   levETIRAcetam (KEPPRA) 750 MG tablet Take 1 tablet (750 mg total) by mouth 2 (two) times daily.   Multiple Vitamin (MULTIVITAMIN ADULT) TABS Take 2 tablets by mouth daily.   omeprazole (PRILOSEC) 20 MG capsule Take 20 mg by mouth daily before breakfast.   potassium chloride (KLOR-CON) 10 MEQ tablet Take 10 mEq by mouth daily.   PROAIR HFA 108 (90 Base) MCG/ACT inhaler Inhale 2 puffs into the lungs every 6 (six) hours as needed for shortness of breath  or wheezing.   thiamine 100 MG tablet Take 100 mg by mouth daily.   Tiotropium Bromide-Olodaterol (STIOLTO RESPIMAT) 2.5-2.5 MCG/ACT AERS Inhale 2 puffs into the lungs daily.   traZODone (DESYREL) 50 MG tablet Take 1 tablet (50 mg total) by mouth at bedtime.           Objective:       Wt Readings from Last 3 Encounters:  10/01/21 140 lb (63.5 kg)  09/01/21 140 lb (63.5 kg)  08/05/21 127 lb (57.6 kg)      Vital signs reviewed  10/01/2021  - Note at rest 02 sats  93% on 2lpm cont   General appearance:    chronically ill appearing bm in w/c    HEENT :  Oropharynx  clear  Nasal turbintes nl    NECK :  without JVD/Nodes/TM/ nl carotid upstrokes bilaterally   LUNGS: no acc muscle use,  Mod barrel  contour chest wall with bilateral  Distant bs s audible wheeze and  without cough on insp or exp maneuvers and mod  Hyperresonant  to  percussion bilaterally     CV:  RRR  no s3 or murmur or increase in P2, and no edema   ABD:  soft and nontender with pos mid insp Hoover's  in the supine position. No bruits or organomegaly appreciated, bowel sounds nl  MS:   Ext warm without deformities or   obvious joint restrictions , calf tenderness, cyanosis or clubbing  SKIN: warm and  dry without lesions    NEURO:  alert, approp,no obvious focal def, resting tremor   CXR PA and Lateral:   10/01/2021 :    I personally reviewed images and impression is as follows:     RML atx/ L base SSA and cm/ copd changes                      Assessment

## 2021-10-02 NOTE — Assessment & Plan Note (Signed)
02 dep since ? 2022 -  HC03  11/27/20   =  38  - 01/16/2021 desaturated on 4lpm walking 25 ft x 2  Down to sats upper 70s  Again advised: Make sure you check your oxygen saturation  AT  your highest level of activity (not after you stop)   to be sure it stays over 90% and adjust  02 flow upward to maintain this level if needed but remember to turn it back to previous settings when you stop (to conserve your supply).

## 2021-10-02 NOTE — Assessment & Plan Note (Signed)
Quit smoking summer 2021 - 01/16/2021    try stiolto 2 pffs each am and off acei/ coreg  - 10/01/2021  After extensive coaching inhaler device,  effectiveness =    75% (short ti) > continue stiolto   Pt is Group B in terms of symptom/risk and laba/lama therefore appropriate rx at this point >>>  Continue stiolto and approp saba

## 2021-10-02 NOTE — Assessment & Plan Note (Addendum)
01/16/21 Stop lisinopril and the carvedilol and started Start bisoprolol 5 mg twice daily in place of both lisinopril and carvedolol.  Although even in retrospect it may not be clear the ACEi or coregcontributed to the pt's symptoms,  Pt improved off them and adding them back at this point or in the future would risk confusion in interpretation of non-specific respiratory symptoms to which this patient is prone  ie  Better not to muddy the waters here  Will probably need additional hbp rx but will defer to cards rx of diastolic dysfunction .   Each maintenance medication was reviewed in detail including emphasizing most importantly the difference between maintenance and prns and under what circumstances the prns are to be triggered using an action plan format where appropriate.  Total time for H and P, chart review, counseling, reviewing SMI/HFA/02  device(s) , directly observing portions of ambulatory 02 saturation study/ and generating customized AVS unique to this office visit / same day charting = 24 min

## 2021-10-04 ENCOUNTER — Encounter: Payer: Self-pay | Admitting: Internal Medicine

## 2021-10-13 NOTE — Progress Notes (Signed)
Tried calling the pt and there was no answer and VM full. Will call back.

## 2021-11-02 ENCOUNTER — Telehealth (HOSPITAL_COMMUNITY): Payer: Self-pay

## 2021-11-02 NOTE — Telephone Encounter (Signed)
Contacted Kaion's sister and she advised they had a sister to pass, buried her yesterday.  They are having a hard time with it.  Offered words of support.  She has rescheduled Trigger's hf clinic appt for week to next month.  She advised he is doing well as far as his heart and breathing.  He has took his sister death hard, it was unexpected.  Asked if there was anything they need, she advised no, her husband is a lot of support for both of them.  Advised will make a home visit in a couple of weeks and she states will be fined.  Advised her anything comes up I could help with to call me.  She appeared to understand.  Will continue to visit for heart failure, diet and medication management.   [name] Plato EMT-Paramedic [phone]

## 2021-11-03 ENCOUNTER — Ambulatory Visit: Payer: Medicaid Other | Admitting: Family

## 2021-12-02 ENCOUNTER — Other Ambulatory Visit (HOSPITAL_COMMUNITY): Payer: Self-pay

## 2021-12-02 NOTE — Progress Notes (Signed)
Spoke to [employer] and sister.  He has been doing well as far as his heart failure.  They are both greiving from loss of their sister suddenly.  They are both aware of heart failure clinic appt tomorrow and states they will be there.  Will continue to visit for heart failure, diet and medication compliance.   [name] Hamilton EMT-Paramedic [phone]

## 2021-12-03 ENCOUNTER — Ambulatory Visit: Payer: Medicaid Other | Attending: Family | Admitting: Family

## 2021-12-03 ENCOUNTER — Encounter: Payer: Self-pay | Admitting: Family

## 2021-12-03 VITALS — BP 139/88 | HR 69 | Resp 16 | Ht 67.0 in | Wt 136.0 lb

## 2021-12-03 DIAGNOSIS — J431 Panlobular emphysema: Secondary | ICD-10-CM | POA: Diagnosis not present

## 2021-12-03 DIAGNOSIS — I110 Hypertensive heart disease with heart failure: Secondary | ICD-10-CM | POA: Insufficient documentation

## 2021-12-03 DIAGNOSIS — F1021 Alcohol dependence, in remission: Secondary | ICD-10-CM | POA: Insufficient documentation

## 2021-12-03 DIAGNOSIS — Z87891 Personal history of nicotine dependence: Secondary | ICD-10-CM | POA: Insufficient documentation

## 2021-12-03 DIAGNOSIS — I10 Essential (primary) hypertension: Secondary | ICD-10-CM | POA: Diagnosis not present

## 2021-12-03 DIAGNOSIS — R569 Unspecified convulsions: Secondary | ICD-10-CM | POA: Diagnosis not present

## 2021-12-03 DIAGNOSIS — J449 Chronic obstructive pulmonary disease, unspecified: Secondary | ICD-10-CM | POA: Diagnosis not present

## 2021-12-03 DIAGNOSIS — I5032 Chronic diastolic (congestive) heart failure: Secondary | ICD-10-CM | POA: Diagnosis not present

## 2021-12-03 NOTE — Patient Instructions (Addendum)
Continue weighing daily and call for an overnight weight gain of 3 pounds or more or a weekly weight gain of more than 5 pounds.   If you have voicemail, please make sure your mailbox is cleaned out so that we may leave a message and please make sure to listen to any voicemails.    After you see Dr. Sherene Sires, [facility] done.

## 2021-12-03 NOTE — Progress Notes (Signed)
Patient ID: Darin Hopkins, male    DOB: 1960-11-04, 61 y.o.   MRN: [id number]  HPI  Darin Hopkins is a 61 y/o male with a history of HTN, COPD, seizures, previous tobacco/ alcohol use and chronic heart failure.   Echo report from 08/18/20 reviewed and showed an EF of 60-65% along with mild LVH and moderate AS.  Has not been admitted or been in the ED in the last 6 months.   He presents today for a follow-up visit with a chief complaint of minimal shortness of breath upon moderate exertion. Describes this as chronic in nature. He has associated fatigue along with this. He denies any difficulty sleeping, dizziness, abdominal distention, palpitations, pedal edema, chest pain, cough or weight gain.   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    Sister recently and suddenly passed away a couple of weeks ago. Apparently she had stage IV cancer and did not tell anyone about it.   Past Medical History:  Diagnosis Date   CHF (congestive heart failure) (HCC)    COPD (chronic obstructive pulmonary disease) (HCC)    Hypertension    Seizures (HCC)    No past surgical history on file. No family history on file. Social History   Tobacco Use   Smoking status: Former    Packs/day: 2.00    Types: Cigarettes    Quit date: 2021    Years since quitting: 2.5   Smokeless tobacco: Never  Substance Use Topics   Alcohol use: Yes    Alcohol/week: 3.0 standard drinks of alcohol    Types: 3 Cans of beer per week    Comment: daily   No Known Allergies  Prior to Admission medications   Medication Sig Start Date End Date Taking? Authorizing Provider  amLODipine (NORVASC) 10 MG tablet Take 10 mg by mouth every evening. 07/28/20   [provider]  aspirin 81 MG EC tablet Take 81 mg by mouth in the morning.    [provider]  atorvastatin (LIPITOR) 20 MG tablet Take 20 mg by mouth every evening. 07/28/20   [provider]  bisoprolol (ZEBETA) 5 MG tablet One twice  daily 01/16/21   Nyoka Cowden, MD  Cholecalciferol (VITAMIN D-3) 25 MCG (1000 UT) CAPS Take 2 capsules (2,000 Units total) by mouth in the morning. 03/05/21   Delma Freeze, FNP  doxazosin (CARDURA) 2 MG tablet Take 2 mg by mouth every evening. 07/28/20   [provider]  folic acid (FOLVITE) 1 MG tablet Take 1 mg by mouth daily. 04/17/20   [provider]  furosemide (LASIX) 40 MG tablet Take 40 mg by mouth daily. 11/17/20   [provider]  hydrALAZINE (APRESOLINE) 50 MG tablet Take 0.5 tablets (25 mg total) by mouth 3 (three) times daily. 03/05/21   Delma Freeze, FNP  levETIRAcetam (KEPPRA) 750 MG tablet Take 1 tablet (750 mg total) by mouth 2 (two) times daily. 09/12/20   Pokhrel, Rebekah Chesterfield, MD  Multiple Vitamin (MULTIVITAMIN ADULT) TABS Take 2 tablets by mouth daily.    [provider]  omeprazole (PRILOSEC) 20 MG capsule Take 20 mg by mouth daily before breakfast. 07/28/20   [provider]  potassium chloride (KLOR-CON) 10 MEQ tablet Take 10 mEq by mouth daily. 11/17/20   [provider]  PROAIR HFA 108 (90 Base) MCG/ACT inhaler Inhale 2 puffs into the lungs every 6 (six) hours as needed for shortness of breath or wheezing. 07/28/20   [provider]  thiamine 100 MG tablet Take 100 mg by mouth daily. 11/17/20   [provider]  Tiotropium Bromide-Olodaterol (STIOLTO RESPIMAT) 2.5-2.5 MCG/ACT AERS Inhale 2 puffs into the lungs daily. 01/16/21   Tanda Rockers, MD  traZODone (DESYREL) 50 MG tablet Take 1 tablet (50 mg total) by mouth at bedtime. 03/05/21   Alisa Graff, FNP   Review of Systems  Constitutional:  Positive for fatigue. Negative for appetite change.  HENT:  Negative for congestion, postnasal drip and sore throat.   Eyes: Negative.   Respiratory:  Positive for shortness of breath (with moderate exertion). Negative for cough and chest tightness.   Cardiovascular:  Negative for chest pain, palpitations and leg  swelling.  Gastrointestinal:  Negative for abdominal distention and abdominal pain.  Endocrine: Negative.   Genitourinary: Negative.  Negative for scrotal swelling.  Musculoskeletal:  Negative for back pain and neck pain.  Skin: Negative.   Allergic/Immunologic: Negative.   Neurological:  Negative for dizziness and light-headedness.  Hematological:  Negative for adenopathy. Does not bruise/bleed easily.  Psychiatric/Behavioral:  Negative for dysphoric mood and sleep disturbance (sleeping on 2 pillows with oxygen). The patient is not nervous/anxious.    Vitals:   12/03/21 0950  BP: 139/88  Pulse: 69  Resp: 16  SpO2: 91%  Weight: 136 lb (61.7 kg)  Height: 5\' 7"  (1.702 m)   Wt Readings from Last 3 Encounters:  12/03/21 136 lb (61.7 kg)  10/01/21 140 lb (63.5 kg)  09/01/21 140 lb (63.5 kg)   Lab Results  Component Value Date   CREATININE 1.47 (H) 11/27/2020   CREATININE 1.57 (H) 11/26/2020   CREATININE 1.59 (H) 11/25/2020   Physical Exam Vitals and nursing note reviewed. Exam conducted with a chaperone present (sister).  Constitutional:      Appearance: Normal appearance.  HENT:     Head: Normocephalic and atraumatic.  Cardiovascular:     Rate and Rhythm: Normal rate and regular rhythm.  Pulmonary:     Effort: Pulmonary effort is normal.     Breath sounds: No wheezing or rales.  Abdominal:     General: There is no distension.     Palpations: Abdomen is soft.     Tenderness: There is no abdominal tenderness.  Musculoskeletal:        General: No tenderness.     Cervical back: Normal range of motion and neck supple.     Right lower leg: No edema.     Left lower leg: No edema.  Skin:    General: Skin is warm and dry.  Neurological:     Mental Status: He is alert and oriented to person, place, and time. Mental status is at baseline.  Psychiatric:        Mood and Affect: Mood normal.        Behavior: Behavior normal.    Assessment & Plan:  1: Chronic heart failure  with preserved ejection fraction with structural changes (LVH)- - NYHA class II - euvolemic today - weighing daily; reminded to call for an overnight weight gain of > 2 pounds or a weekly weight gain of > 5 pounds - weight down 4 pounds from last visit here 3 months ago - rarely adding salt and his sister has been reading food labels for sodium content - NS for echo on 10/01/21 and this was rescheduled for 01/01/22; emphasized that if he can't make the appointment to please call and cancel it instead of not showing - needs to get  established with cardiology but will defer for now - BNP 11/25/20 was 575.8  2: HTN- - BP mildly elevated (139/88) - emotional support given regarding his sister's recent death - follows with [facility]  - BMP 11/27/20 reviewed and showed sodium 143, potassium 4.3, creatinine 1.47 and GFR 54  3: COPD- - wearing oxygen at 2-3L around the clock at home although he decreases it to 2L when he's out so that it will last longer - saw pulmonology Sherene Sires) 10/01/21; returns 01/01/22 - continues to not smoke    Patient did not bring his medications nor a list. Each medication was verbally reviewed with the patient and he was encouraged to bring the bottles to every visit to confirm accuracy of list.   Return in 2 months, sooner if needed.

## 2021-12-30 ENCOUNTER — Ambulatory Visit: Payer: Medicaid Other | Attending: Internal Medicine

## 2022-01-01 ENCOUNTER — Ambulatory Visit: Payer: Medicaid Other | Admitting: Internal Medicine

## 2022-01-01 ENCOUNTER — Ambulatory Visit: Admission: RE | Admit: 2022-01-01 | Payer: Medicaid Other | Source: Ambulatory Visit

## 2022-01-01 NOTE — Progress Notes (Deleted)
Darin Hopkins, male    DOB: 1960/11/30,    MRN: [id number]   Brief patient profile:   58 yobm former smoker stopped summer 2021 with asthma as child with dx of copd around 2019 on daily inhalers and 02 x 2022 with chronic hypercarbic and hypoxemic Resp failure referred to pulmonary clinic in [facility]  01/16/2021 by Elisabeth Cara   s/p admit:   Admit date: 11/25/2020 Discharge date: 11/27/2020      Discharge Diagnoses:  Principal Problem:   Acute hypoxemic respiratory failure (HCC)   Acute exacerbation of CHF (congestive heart failure) (HCC)   Essential hypertension   History of seizure   COPD (chronic obstructive pulmonary disease) ([place])   Hyperlipidemia   GERD (gastroesophageal reflux disease)   Protein-calorie malnutrition, severe   COPD exacerbation ([place])   Chronic kidney disease, stage III (moderate) (HCC)   AKI (acute kidney injury) ([place])     History of present illness:  Darin Hopkins is a 61 y.o. male with medical history significant for hypertension, GERD, history of seizure, last seizure was in May 2022, CKD3, hyperlipidemia, COPD, history of CVA about 2.5 years ago, tobacco use, presents to the ED for chief concern of shortness of breath.    He reports that the shortness of breath started AM prior to hospitalization.  He states that the shortness of breath is worse with exertion.  States he ran out of his inhaler for 1 day. He states he tries to drink a lot of water and he does not know the quantity in which he drinks fluid.  He denies drinking soda, tea, EtOH.  He states specifically that his last EtOH drink was December 2021.   He reports improvement with BiPAP.   Hospital Course:  Patient presented with acute on chronic hypoxic respiratory failure. Treated briefly with bipap, then transitioned to home O2. Initially treated for CHF exacerbation with diuresis, but overall picture more suggestive of copd exacerbation. Treated with prednisone which he will continue.  Patient also had AKI of 1.57 from baseline in low 1s, improved to 1.47 on day of discharge, will need check of kidney function at f/u. Noted to be bradycardic and asymptomatic during his hospital stay, dose of coreg was decreased. Has a hx of seizures, none this hospitalization. Will need close pcp f/u.      History of Present Illness  01/16/2021  Pulmonary/ 1st office eval/ Carlia Bomkamp / [employer]  Chief Complaint  Patient presents with   Consult    Copd- 4L   Dyspnea:  also limited by balance but extremely sedentary - 25 ft max on fixed 02 = 4lpm  Cough: very hoarse, freq throat clearing but min mucoid sputum  Sleep: flat bed, one pillow does ok/ no am exac SABA use: not really helping, very poor technique  Rec Stop lisinopril and the carvedilol  Start bisoprolol 5 mg twice daily in place of both lisinopril and carvedolol. Stop all inhalers except red one (Proair as below) Plan A = Automatic = Always=    start stiolto 2 puff daily first thing in AM  Plan B = Backup (to supplement plan A, not to replace it) Only use your albuterol inhaler - red/Proair as a rescue medication  Make sure you check your oxygen saturation at your highest level of activity  to be sure it stays 88-92% If getting worse on this plan call me or go to the ER if after hours  Please schedule a follow up office visit  in 6 weeks, call sooner if needed - bring all active medications/inhalers    10/01/2021  f/u ov/Caiya Bettes/ [facility] re: copd GOLD ?    maint on stiolto  Chief Complaint  Patient presents with   Follow-up    Patient feels like breathing is better since last visit. Wears 2 liters when he's out and then 3 liters at home. They would like to talk about cutting back on oxygen.  Dyspnea:  Room to room since cva caused balance problem  Can do grocery shopping leaning on cart @ wm x 2 aisles @ 2lpm but not checking sats  Cough: minimal white improved Sleeping: bed  SABA use: maybe qod  02: 3lpm  sleeping and sitting then 2lpm out  - does not titrate as rec  Covid status:   vax x 2  Rec Work on inhaler technique:   Make sure you check your oxygen saturation  AT  your highest level of activity (not after you stop)   to be sure it stays over 90%  Please remember to go to the  x-ray department  [facility] wnl   Please schedule a follow up visit in 3 months but call sooner if needed - bring both inhalers for PFTs on return/ needs LDSCT    01/01/2022  f/u ov/Azayla Polo re: ***   maint on ***  No chief complaint on file.   Dyspnea:  *** Cough: *** Sleeping: *** SABA use: *** 02: *** Covid status:   ***   No obvious day to day or daytime variability or assoc excess/ purulent sputum or mucus plugs or hemoptysis or cp or chest tightness, subjective wheeze or overt sinus or hb symptoms.   *** without nocturnal  or early am exacerbation  of respiratory  c/o's or need for noct saba. Also denies any obvious fluctuation of symptoms with weather or environmental changes or other aggravating or alleviating factors except as outlined above   No unusual exposure hx or h/o childhood pna/ asthma or knowledge of premature birth.  Current Allergies, Complete Past Medical History, Past Surgical History, Family History, and Social History were reviewed in [employer] record.  ROS  The following are not active complaints unless bolded Hoarseness, sore throat, dysphagia, dental problems, itching, sneezing,  nasal congestion or discharge of excess mucus or purulent secretions, ear ache,   fever, chills, sweats, unintended wt loss or wt gain, classically pleuritic or exertional cp,  orthopnea pnd or arm/hand swelling  or leg swelling, presyncope, palpitations, abdominal pain, anorexia, nausea, vomiting, diarrhea  or change in bowel habits or change in bladder habits, change in stools or change in urine, dysuria, hematuria,  rash, arthralgias, visual complaints, headache, numbness, weakness or  ataxia or problems with walking or coordination,  change in mood or  memory.        No outpatient medications have been marked as taking for the 01/01/22 encounter (Appointment) with Nyoka Cowden, MD.          Objective:       01/01/2022        ***   10/01/21 140 lb (63.5 kg)  09/01/21 140 lb (63.5 kg)  08/05/21 127 lb (57.6 kg)    Vital signs reviewed  01/01/2022  - Note at rest 02 sats  ***% on ***   General appearance:    ***      Mod bar ***  Assessment

## 2022-01-04 ENCOUNTER — Other Ambulatory Visit: Payer: Self-pay | Admitting: Internal Medicine

## 2022-01-05 ENCOUNTER — Other Ambulatory Visit: Payer: Self-pay

## 2022-01-05 ENCOUNTER — Other Ambulatory Visit: Payer: Self-pay | Admitting: Internal Medicine

## 2022-01-05 ENCOUNTER — Telehealth (HOSPITAL_COMMUNITY): Payer: Self-pay

## 2022-01-05 MED ORDER — STIOLTO RESPIMAT 2.5-2.5 MCG/ACT IN AERS: 2.0000 | INHALATION_SPRAY | Freq: Every day | RESPIRATORY_TRACT | 0 refills | Status: DC

## 2022-01-05 NOTE — Telephone Encounter (Signed)
Attempted to contact, no answer.  Unable to leave a message.  Will continue to try another day.   [name] Witt EMT-Paramedic [phone]

## 2022-02-01 ENCOUNTER — Telehealth (HOSPITAL_COMMUNITY): Payer: Self-pay

## 2022-02-01 NOTE — Telephone Encounter (Signed)
Attempted to contact, no answer.  Will keep trying.   [place] [phone]

## 2022-02-02 ENCOUNTER — Ambulatory Visit: Payer: Medicaid Other | Admitting: Family

## 2022-02-09 ENCOUNTER — Ambulatory Visit: Payer: Medicaid Other | Admitting: Family

## 2022-02-11 ENCOUNTER — Ambulatory Visit: Payer: Medicaid Other | Attending: Family | Admitting: Family

## 2022-02-11 ENCOUNTER — Encounter: Payer: Self-pay | Admitting: Family

## 2022-02-11 VITALS — BP 134/95 | HR 62 | Resp 16 | Ht 67.0 in | Wt 136.4 lb

## 2022-02-11 DIAGNOSIS — I10 Essential (primary) hypertension: Secondary | ICD-10-CM | POA: Diagnosis not present

## 2022-02-11 DIAGNOSIS — R569 Unspecified convulsions: Secondary | ICD-10-CM | POA: Insufficient documentation

## 2022-02-11 DIAGNOSIS — Z87891 Personal history of nicotine dependence: Secondary | ICD-10-CM | POA: Diagnosis not present

## 2022-02-11 DIAGNOSIS — I110 Hypertensive heart disease with heart failure: Secondary | ICD-10-CM | POA: Insufficient documentation

## 2022-02-11 DIAGNOSIS — F1021 Alcohol dependence, in remission: Secondary | ICD-10-CM | POA: Diagnosis not present

## 2022-02-11 DIAGNOSIS — J449 Chronic obstructive pulmonary disease, unspecified: Secondary | ICD-10-CM | POA: Diagnosis not present

## 2022-02-11 DIAGNOSIS — J431 Panlobular emphysema: Secondary | ICD-10-CM

## 2022-02-11 DIAGNOSIS — I5032 Chronic diastolic (congestive) heart failure: Secondary | ICD-10-CM | POA: Diagnosis not present

## 2022-02-11 NOTE — Progress Notes (Signed)
Patient ID: Darin Hopkins, male    DOB: 20-May-1960, 61 y.o.   MRN: [id number]  HPI  Darin Hopkins is Hopkins 61 y/o male with Hopkins history of HTN, COPD, seizures, previous tobacco/ alcohol use and chronic heart failure.   Echo report from 08/18/20 reviewed and showed an EF of 60-65% along with mild LVH and moderate AS.  Has not been admitted or been in the ED in the last 6 months.   He presents today for Hopkins follow-up visit with Hopkins chief complaint of minimal shortness of breath upon moderate exertion. Describes this as chronic in nature. Has no other symptoms and specifically denies any difficulty sleeping, dizziness, abdominal distention, palpitations, pedal edema, chest pain, cough, fatigue or weight gain.   Continues to wear his oxygen at 2L around the clock. Needs to get his echo and pulmonology appt rescheduled.   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    Past Medical History:  Diagnosis Date   CHF (congestive heart failure) (HCC)    COPD (chronic obstructive pulmonary disease) (HCC)    Hypertension    Seizures (HCC)    History reviewed. No pertinent surgical history. History reviewed. No pertinent family history. Social History   Tobacco Use   Smoking status: Former    Packs/day: 2.00    Types: Cigarettes    Quit date: 2021    Years since quitting: 2.7   Smokeless tobacco: Never  Substance Use Topics   Alcohol use: Yes    Alcohol/week: 3.0 standard drinks of alcohol    Types: 3 Cans of beer per week    Comment: daily   No Known Allergies  Prior to Admission medications   Medication Sig Start Date End Date Taking? Authorizing Provider  amLODipine (NORVASC) 10 MG tablet Take 10 mg by mouth every evening. 07/28/20  Yes [provider]  aspirin 81 MG EC tablet Take 81 mg by mouth in the morning.   Yes [provider]  atorvastatin (LIPITOR) 20 MG tablet Take 20 mg by mouth every evening. 07/28/20  Yes [provider]  bisoprolol (ZEBETA) 5 MG  tablet One twice daily 01/16/21  Yes Darin Cowden, MD  Cholecalciferol (VITAMIN D-3) 25 MCG (1000 UT) CAPS Take 2 capsules (2,000 Units total) by mouth in the morning. 03/05/21  Yes Darin Kindred A, FNP  doxazosin (CARDURA) 2 MG tablet Take 2 mg by mouth every evening. 07/28/20  Yes [provider]  folic acid (FOLVITE) 1 MG tablet Take 1 mg by mouth daily. 04/17/20  Yes [provider]  furosemide (LASIX) 40 MG tablet Take 40 mg by mouth daily. 11/17/20  Yes [provider]  hydrALAZINE (APRESOLINE) 50 MG tablet Take 0.5 tablets (25 mg total) by mouth 3 (three) times daily. 03/05/21  Yes Darin Kindred A, FNP  levETIRAcetam (KEPPRA) 750 MG tablet Take 1 tablet (750 mg total) by mouth 2 (two) times daily. 09/12/20  Yes Pokhrel, Laxman, MD  Multiple Vitamin (MULTIVITAMIN ADULT) TABS Take 2 tablets by mouth daily.   Yes [provider]  omeprazole (PRILOSEC) 20 MG capsule Take 20 mg by mouth daily before breakfast. 07/28/20  Yes [provider]  potassium chloride (KLOR-CON) 10 MEQ tablet Take 10 mEq by mouth daily. 11/17/20  Yes [provider]  PROAIR HFA 108 (90 Base) MCG/ACT inhaler Inhale 2 puffs into the lungs every 6 (six) hours as needed for shortness of breath or wheezing. 07/28/20  Yes [provider]  thiamine 100 MG  tablet Take 100 mg by mouth daily. 11/17/20  Yes [provider]  Tiotropium Bromide-Olodaterol (STIOLTO RESPIMAT) 2.5-2.5 MCG/ACT AERS Inhale 2 puffs into the lungs daily. Pt needs to make an appointment to continue to receive refills please. 01/05/22  Yes Darin Cowden, MD  traZODone (DESYREL) 50 MG tablet Take 1 tablet (50 mg total) by mouth at bedtime. 03/05/21  Yes Darin Freeze, FNP    Review of Systems  Constitutional:  Negative for appetite change and fatigue.  HENT:  Negative for congestion, postnasal drip and sore throat.   Eyes: Negative.   Respiratory:  Positive for shortness of breath. Negative  for cough and chest tightness.   Cardiovascular:  Negative for chest pain, palpitations and leg swelling.  Gastrointestinal:  Negative for abdominal distention and abdominal pain.  Endocrine: Negative.   Genitourinary: Negative.  Negative for scrotal swelling.  Musculoskeletal:  Negative for back pain and neck pain.  Skin: Negative.   Allergic/Immunologic: Negative.   Neurological:  Negative for dizziness and light-headedness.  Hematological:  Negative for adenopathy. Does not bruise/bleed easily.  Psychiatric/Behavioral:  Negative for dysphoric mood and sleep disturbance (sleeping on 2 pillows with oxygen). The patient is not nervous/anxious.    Vitals:   02/11/22 1009  BP: (!) 134/95  Pulse: 62  Resp: 16  SpO2: 100%  Weight: 136 lb 6 oz (61.9 kg)  Height: 5\' 7"  (1.702 m)   Wt Readings from Last 3 Encounters:  02/11/22 136 lb 6 oz (61.9 kg)  12/03/21 136 lb (61.7 kg)  10/01/21 140 lb (63.5 kg)   Lab Results  Component Value Date   CREATININE 1.47 (H) 11/27/2020   CREATININE 1.57 (H) 11/26/2020   CREATININE 1.59 (H) 11/25/2020   Physical Exam Vitals and nursing note reviewed. Exam conducted with Hopkins chaperone present (Darin Hopkins).  Constitutional:      Appearance: Normal appearance.  HENT:     Head: Normocephalic and atraumatic.  Cardiovascular:     Rate and Rhythm: Normal rate and regular rhythm.  Pulmonary:     Effort: Pulmonary effort is normal.     Breath sounds: No wheezing or rales.  Abdominal:     General: There is no distension.     Palpations: Abdomen is soft.     Tenderness: There is no abdominal tenderness.  Musculoskeletal:        General: No tenderness.     Cervical back: Normal range of motion and neck supple.     Right lower leg: No edema.     Left lower leg: No edema.  Skin:    General: Skin is warm and dry.  Neurological:     Mental Status: He is alert and oriented to person, place, and time. Mental status is at baseline.  Psychiatric:        Mood  and Affect: Mood normal.        Behavior: Behavior normal.    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)- - NYHA class II - euvolemic today - weighing daily; reminded to call for an overnight weight gain of > 2 pounds or Hopkins weekly weight gain of > 5 pounds - weight unchanged from last visit here 2 months ago - rarely adding salt and his Darin Hopkins has been reading food labels for sodium content - NS for echo on 01/01/22 & this was r/s for 02/26/22; emphasized that if he can't make the appointment to please call and cancel it instead  of not showing - needs to get established with cardiology but will defer for now - BNP 11/25/20 was 575.8  2: HTN- - BP mildly elevated (134/95) - follows with [facility]  - BMP 11/27/20 reviewed and showed sodium 143, potassium 4.3, creatinine 1.47 and GFR 54  3: COPD- - wearing oxygen at 2-3L around the clock at home although he decreases it to 2L when he's out so that it will last longer - saw pulmonology Melvyn Novas) 10/01/21 - he had cancelled last pulmonology appointment due to Darin Hopkins's death; emphasized that he call and get this rescheduled - continues to not smoke    Patient did not bring his medications nor Hopkins list. Each medication was verbally reviewed with the patient and he was encouraged to bring the bottles to every visit to confirm accuracy of list.   Return in 4 months, sooner if needed.

## 2022-02-11 NOTE — Patient Instructions (Signed)
Continue weighing daily and call for an overnight weight gain of 3 pounds or more or a weekly weight gain of more than 5 pounds.   If you have voicemail, please make sure your mailbox is cleaned out so that we may leave a message and please make sure to listen to any voicemails.     

## 2022-02-26 ENCOUNTER — Ambulatory Visit: Admission: RE | Admit: 2022-02-26 | Payer: Medicaid Other | Source: Ambulatory Visit

## 2022-06-03 IMAGING — CR DG CHEST 2V
1 series · 2 of 2 positions shown · non-contrast
Comparison: Chest radiographs 11/25/2020

CLINICAL DATA: Shortness of breath.  COPD.

EXAM:
CHEST - 2 VIEW

[Series 1: dg chest 2 view · 0.14mm/px · 2 of 2 slices shown]
[im 1/2]
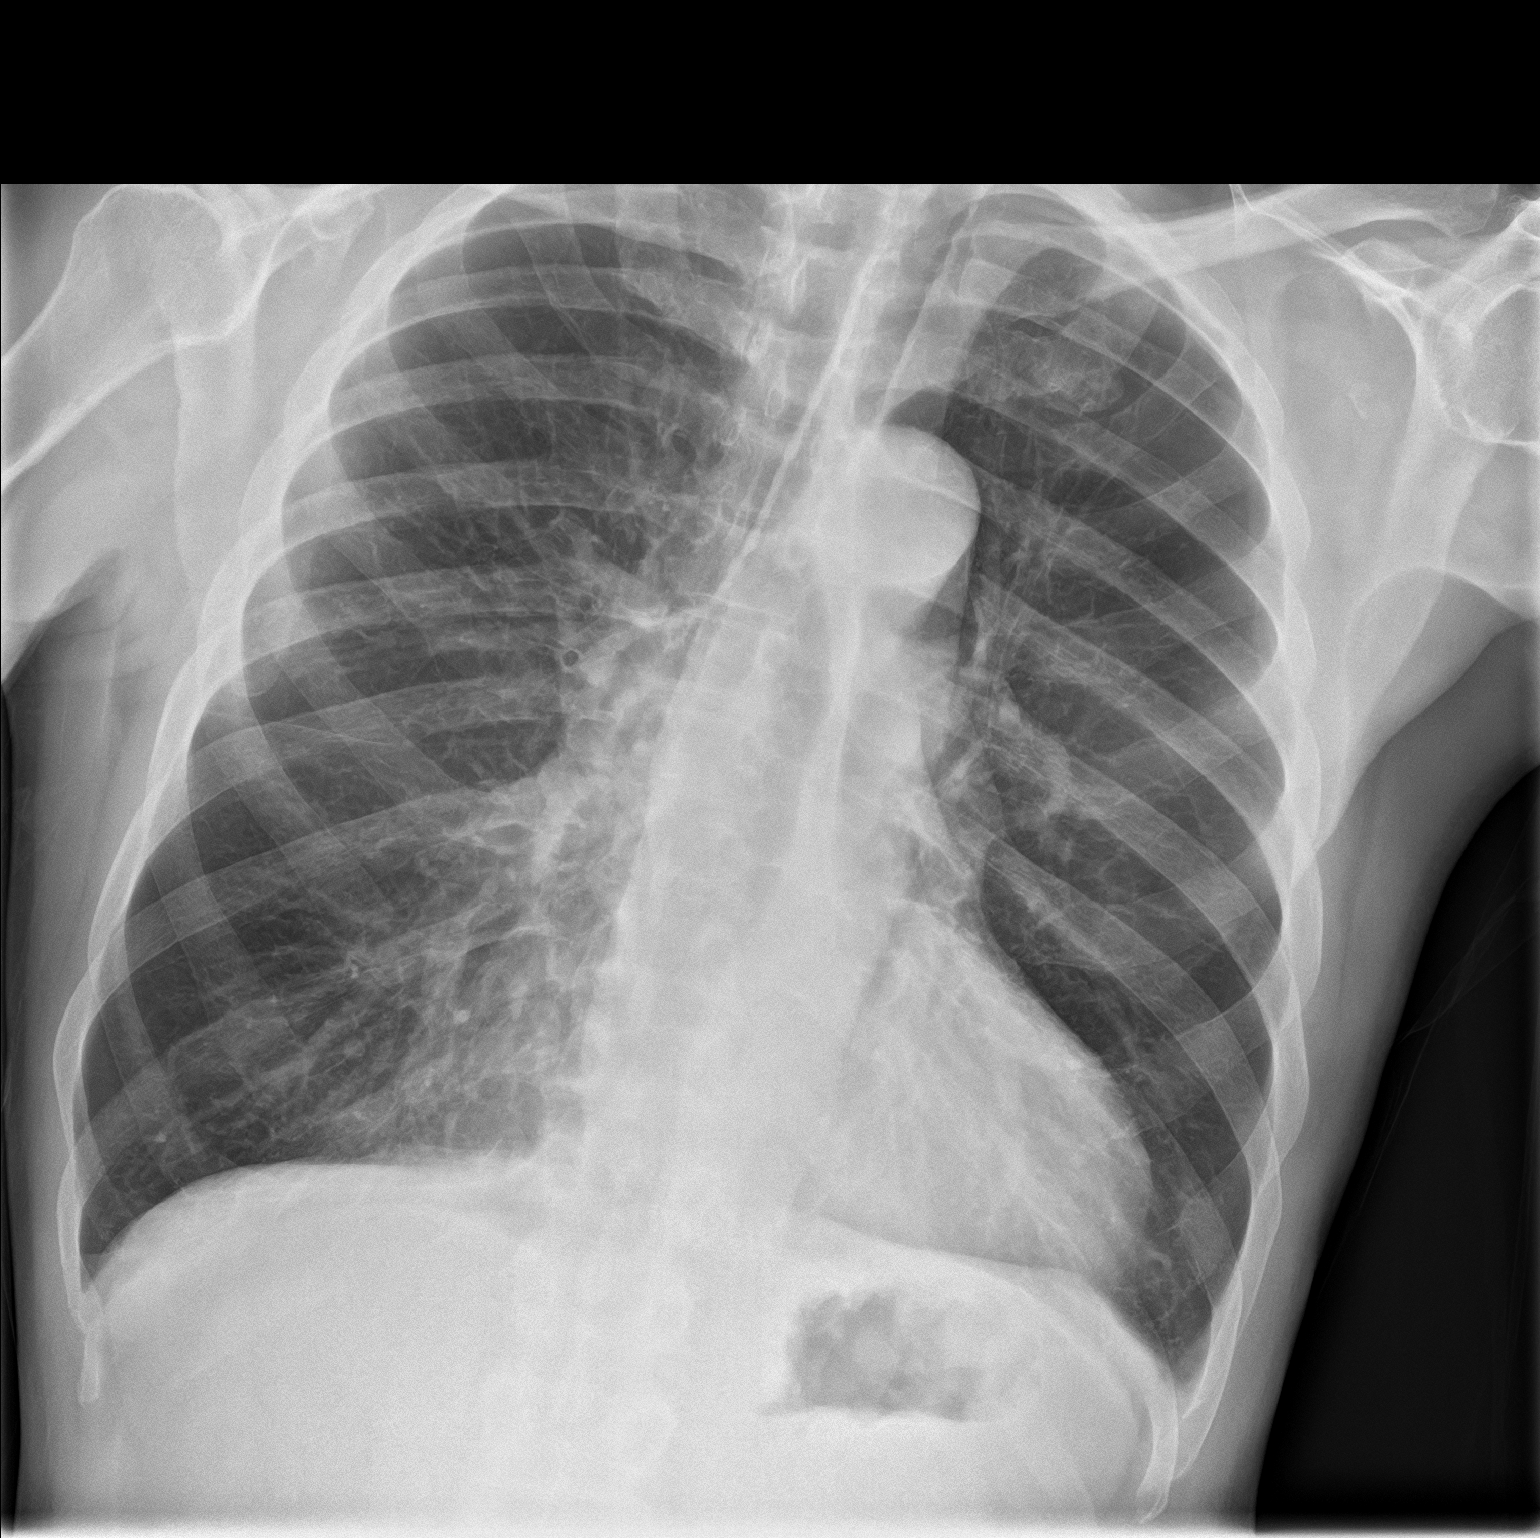
[im 2/2]
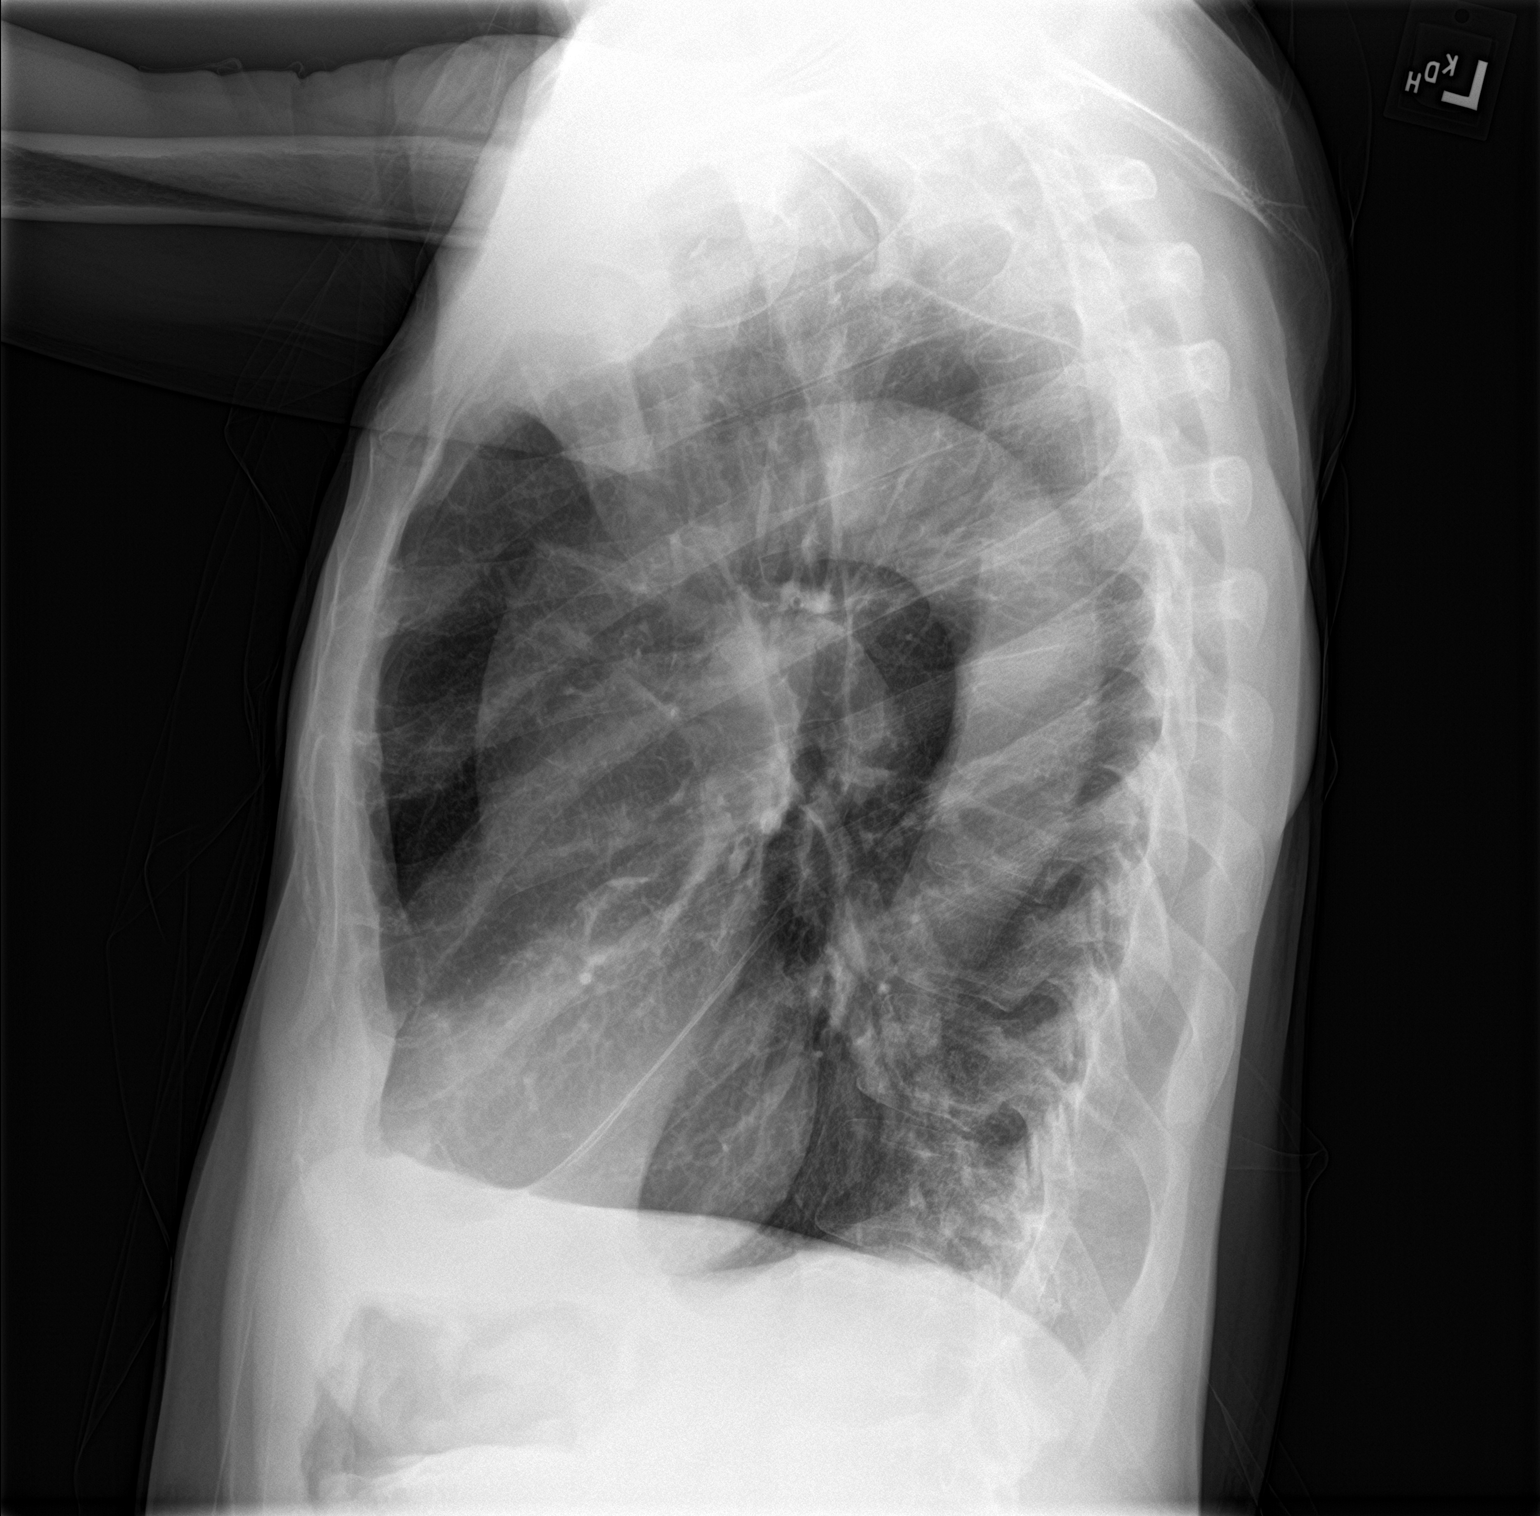

[2 of 2 positions shown; findings below may reference images not displayed]

FINDINGS: The patient is mildly rotated to the left with grossly unremarkable
cardiomediastinal silhouette. The lungs are hyperinflated. No
airspace consolidation, edema, pleural effusion, or pneumothorax is
identified. No acute osseous abnormality is seen.
IMPRESSION: No active cardiopulmonary disease.

## 2022-06-21 NOTE — Progress Notes (Deleted)
Patient ID: Darin Hopkins, male    DOB: [birth year], 62 y.o.   MRN: [id number]  HPI  Darin Hopkins is a 62 y/o male with a history of HTN, COPD, seizures, previous tobacco/ alcohol use and chronic heart failure.   Echo report from 08/18/20 reviewed and showed an EF of 60-65% along with mild LVH and moderate AS.  Has not been admitted or been in the ED in the last 6 months.   He presents today for a follow-up visit with a chief complaint of  Continues to wear his oxygen at 2L around the clock.   Past Medical History:  Diagnosis Date   CHF (congestive heart failure) (HCC)    COPD (chronic obstructive pulmonary disease) (HCC)    Hypertension    Seizures ([place])    No past surgical history on file. No family history on file. Social History   Tobacco Use   Smoking status: Former    Packs/day: 2.00    Types: Cigarettes    Quit date: 2021    Years since quitting: 3.1   Smokeless tobacco: Never  Substance Use Topics   Alcohol use: Yes    Alcohol/week: 3.0 standard drinks of alcohol    Types: 3 Cans of beer per week    Comment: daily   No Known Allergies    Review of Systems  Constitutional:  Negative for appetite change and fatigue.  HENT:  Negative for congestion, postnasal drip and sore throat.   Eyes: Negative.   Respiratory:  Positive for shortness of breath. Negative for cough and chest tightness.   Cardiovascular:  Negative for chest pain, palpitations and leg swelling.  Gastrointestinal:  Negative for abdominal distention and abdominal pain.  Endocrine: Negative.   Genitourinary: Negative.  Negative for scrotal swelling.  Musculoskeletal:  Negative for back pain and neck pain.  Skin: Negative.   Allergic/Immunologic: Negative.   Neurological:  Negative for dizziness and light-headedness.  Hematological:  Negative for adenopathy. Does not bruise/bleed easily.  Psychiatric/Behavioral:  Negative for dysphoric mood and sleep disturbance (sleeping on 2 pillows with  oxygen). The patient is not nervous/anxious.      Physical Exam Vitals and nursing note reviewed. Exam conducted with a chaperone present (sister).  Constitutional:      Appearance: Normal appearance.  HENT:     Head: Normocephalic and atraumatic.  Cardiovascular:     Rate and Rhythm: Normal rate and regular rhythm.  Pulmonary:     Effort: Pulmonary effort is normal.     Breath sounds: No wheezing or rales.  Abdominal:     General: There is no distension.     Palpations: Abdomen is soft.     Tenderness: There is no abdominal tenderness.  Musculoskeletal:        General: No tenderness.     Cervical back: Normal range of motion and neck supple.     Right lower leg: No edema.     Left lower leg: No edema.  Skin:    General: Skin is warm and dry.  Neurological:     Mental Status: He is alert and oriented to person, place, and time. Mental status is at baseline.  Psychiatric:        Mood and Affect: Mood normal.        Behavior: Behavior normal.    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)- - NYHA class II - euvolemic today - weighing daily; reminded to call  for an overnight weight gain of > 2 pounds or a weekly weight gain of > 5 pounds - weight 136.6 from last visit here 4 months ago - rarely adding salt and his sister has been reading food labels for sodium content - NS for echo on 01/01/22 & again on 02/26/22 - needs to get established with cardiology but will defer for now - BNP 11/25/20 was 575.8  2: HTN- - BP  - follows with [facility]  - BMP 11/27/20 reviewed and showed sodium 143, potassium 4.3, creatinine 1.47 and GFR 54  3: COPD- - wearing oxygen at 2-3L around the clock at home although he decreases it to 2L when he's out so that it will last longer - saw pulmonology Melvyn Novas) 10/01/21 - he had cancelled last pulmonology appointment due to sister's death; emphasized that he call and get this  rescheduled - continues to not smoke    Patient did not bring his medications nor a list. Each medication was verbally reviewed with the patient and he was encouraged to bring the bottles to every visit to confirm accuracy of list.

## 2022-06-22 ENCOUNTER — Ambulatory Visit: Payer: Medicaid Other | Admitting: Family

## 2022-06-22 ENCOUNTER — Telehealth: Payer: Self-pay | Admitting: Family

## 2022-06-22 NOTE — Telephone Encounter (Signed)
Patient did not show for his Heart Failure Clinic appointment on 06/22/22. Will attempt to reschedule.

## 2023-02-04 ENCOUNTER — Telehealth: Payer: Self-pay

## 2023-02-04 NOTE — Telephone Encounter (Signed)
Pharmacy called concerning Hydralazing Attempted to call pt to sched an appt as pt has not shown for his appt with Clarisa Kindred, FNP Pt did not answered the phone. Lvm with call back number.

## 2023-06-02 NOTE — Progress Notes (Deleted)
 Advanced Heart Failure Clinic Note   PCP: Martie Round, NP PCP-Cardiologist: None    SCHEDULE THIS  Chief Complaint:   HPI:  Darin Hopkins is a 63 y/o male with a history of HTN, COPD, seizures, previous tobacco/ alcohol use and chronic heart failure.   Has not been admitted or been in the ED in the last 6 months.   Echo 08/18/20: EF of 60-65% along with mild LVH and moderate AS.    He presents today for a HF follow-up visit (although hasn't been seen since 10/23) with a chief complaint of   Continues to wear his oxygen at 2L around the clock.   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    ROS: All systems negative except as listed in HPI, PMH and Problem list   Past Medical History:  Diagnosis Date   CHF (congestive heart failure) (HCC)    COPD (chronic obstructive pulmonary disease) (HCC)    Hypertension    Seizures (HCC)     Current Outpatient Medications  Medication Sig Dispense Refill   amLODipine (NORVASC) 10 MG tablet Take 10 mg by mouth every evening.     aspirin 81 MG EC tablet Take 81 mg by mouth in the morning.     atorvastatin (LIPITOR) 20 MG tablet Take 20 mg by mouth every evening.     bisoprolol (ZEBETA) 5 MG tablet One twice daily 60 tablet 11   Cholecalciferol (VITAMIN D-3) 25 MCG (1000 UT) CAPS Take 2 capsules (2,000 Units total) by mouth in the morning. 60 capsule 0   doxazosin (CARDURA) 2 MG tablet Take 2 mg by mouth every evening.     folic acid (FOLVITE) 1 MG tablet Take 1 mg by mouth daily.     furosemide (LASIX) 40 MG tablet Take 40 mg by mouth daily.     hydrALAZINE (APRESOLINE) 50 MG tablet Take 0.5 tablets (25 mg total) by mouth 3 (three) times daily. 270 tablet 3   levETIRAcetam (KEPPRA) 750 MG tablet Take 1 tablet (750 mg total) by mouth 2 (two) times daily. 60 tablet 2   Multiple Vitamin (MULTIVITAMIN ADULT) TABS Take 2 tablets by mouth daily.     omeprazole (PRILOSEC) 20 MG capsule Take 20 mg by mouth daily before breakfast.      potassium chloride (KLOR-CON) 10 MEQ tablet Take 10 mEq by mouth daily.     PROAIR HFA 108 (90 Base) MCG/ACT inhaler Inhale 2 puffs into the lungs every 6 (six) hours as needed for shortness of breath or wheezing.     thiamine 100 MG tablet Take 100 mg by mouth daily.     Tiotropium Bromide-Olodaterol (STIOLTO RESPIMAT) 2.5-2.5 MCG/ACT AERS Inhale 2 puffs into the lungs daily. Pt needs to make an appointment to continue to receive refills please. 1 each 0   traZODone (DESYREL) 50 MG tablet Take 1 tablet (50 mg total) by mouth at bedtime. 30 tablet 0   No current facility-administered medications for this visit.    No Known Allergies    Social History   Socioeconomic History   Marital status: Divorced    Spouse name: Not on file   Number of children: Not on file   Years of education: Not on file   Highest education level: Not on file  Occupational History   Not on file  Tobacco Use   Smoking status: Former    Current packs/day: 0.00    Types: Cigarettes    Quit date: 2021    Years since  quitting: 4.0   Smokeless tobacco: Never  Vaping Use   Vaping status: Never Used  Substance and Sexual Activity   Alcohol use: Yes    Alcohol/week: 3.0 standard drinks of alcohol    Types: 3 Cans of beer per week    Comment: daily   Drug use: Not Currently   Sexual activity: Not on file  Other Topics Concern   Not on file  Social History Narrative   Not on file   Social Drivers of Health   Financial Resource Strain: Not on file  Food Insecurity: Not on file  Transportation Needs: Not on file  Physical Activity: Not on file  Stress: Not on file  Social Connections: Not on file  Intimate Partner Violence: Not on file     No family history on file.     PHYSICAL EXAM: General:  Well appearing. No respiratory difficulty HEENT: normal Neck: supple. no JVD. Carotids 2+ bilat; no bruits. No lymphadenopathy or thyromegaly appreciated. Cor: PMI nondisplaced. Regular rate &  rhythm. No rubs, gallops or murmurs. Lungs: clear Abdomen: soft, nontender, nondistended. No hepatosplenomegaly. No bruits or masses. Good bowel sounds. Extremities: no cyanosis, clubbing, rash, edema Neuro: alert & oriented x 3, cranial nerves grossly intact. moves all 4 extremities w/o difficulty. Affect pleasant.  ECG:   ASSESSMENT & PLAN:  1: Chronic heart failure with preserved ejection fraction - - suspect due to  - NYHA class II - euvolemic today - weighing daily; reminded to call for an overnight weight gain of > 2 pounds or a weekly weight gain of > 5 pounds - Echo 08/18/20: EF of 60-65% along with mild LVH and moderate AS. - rarely adding salt and his sister has been reading food labels for sodium content - BNP 11/25/20 was 575.8  2: HTN- - BP  - follows with [facility]  - BMP 11/27/20 reviewed and showed sodium 143, potassium 4.3, creatinine 1.47 and GFR 54  3: COPD- - wearing oxygen at 2-3L around the clock at home although he decreases it to 2L when he's out so that it will last longer - saw pulmonology Sherene Sires) 05/23 - he had cancelled last pulmonology appointment due to sister's death; emphasized that he call and get this rescheduled - continues to not smoke

## 2023-06-06 ENCOUNTER — Encounter: Payer: Medicaid Other | Admitting: Family

## 2023-06-06 ENCOUNTER — Telehealth: Payer: Self-pay | Admitting: Family

## 2023-06-06 NOTE — Telephone Encounter (Signed)
Patient did not show for his [facility] appointment on 06/06/23

## 2023-06-17 ENCOUNTER — Telehealth: Payer: Self-pay | Admitting: Family

## 2023-06-17 NOTE — Telephone Encounter (Signed)
 Lvm to r/s appt n/s on 06/06/23

## 2023-06-29 NOTE — Telephone Encounter (Signed)
 Opened in error

## 2023-07-06 ENCOUNTER — Telehealth: Payer: Self-pay | Admitting: Family

## 2023-07-06 NOTE — Telephone Encounter (Signed)
 Pt sister confirmed appt for 07/07/23

## 2023-07-06 NOTE — Progress Notes (Unsigned)
 [facility] Note   PCP: Darin Round, NP PCP-Cardiologist: None    SCHEDULE THIS  Chief Complaint:   HPI:  [name] is a 63 y/o male with a history of HTN, COPD, seizures, previous tobacco/ alcohol use and chronic heart failure.   Has not been admitted or been in the ED in the last 6 months.   Echo 08/18/20: EF of 60-65% along with mild LVH and moderate AS.    He presents today for a HF follow-up visit (although hasn't been seen since 10/23) with a chief complaint of   Continues to wear his oxygen at 2L around the clock.   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    ROS: All systems negative except as listed in HPI, PMH and Problem list   Past Medical History:  Diagnosis Date   CHF (congestive heart failure) (HCC)    COPD (chronic obstructive pulmonary disease) (HCC)    Hypertension    Seizures (HCC)     Current Outpatient Medications  Medication Sig Dispense Refill   amLODipine (NORVASC) 10 MG tablet Take 10 mg by mouth every evening.     aspirin 81 MG EC tablet Take 81 mg by mouth in the morning.     atorvastatin (LIPITOR) 20 MG tablet Take 20 mg by mouth every evening.     bisoprolol (ZEBETA) 5 MG tablet One twice daily 60 tablet 11   Cholecalciferol (VITAMIN D-3) 25 MCG (1000 UT) CAPS Take 2 capsules (2,000 Units total) by mouth in the morning. 60 capsule 0   doxazosin (CARDURA) 2 MG tablet Take 2 mg by mouth every evening.     folic acid (FOLVITE) 1 MG tablet Take 1 mg by mouth daily.     furosemide (LASIX) 40 MG tablet Take 40 mg by mouth daily.     hydrALAZINE (APRESOLINE) 50 MG tablet Take 0.5 tablets (25 mg total) by mouth 3 (three) times daily. 270 tablet 3   levETIRAcetam (KEPPRA) 750 MG tablet Take 1 tablet (750 mg total) by mouth 2 (two) times daily. 60 tablet 2   Multiple Vitamin (MULTIVITAMIN ADULT) TABS Take 2 tablets by mouth daily.     omeprazole (PRILOSEC) 20 MG capsule Take 20 mg by mouth daily before breakfast.      potassium chloride (KLOR-CON) 10 MEQ tablet Take 10 mEq by mouth daily.     PROAIR HFA 108 (90 Base) MCG/ACT inhaler Inhale 2 puffs into the lungs every 6 (six) hours as needed for shortness of breath or wheezing.     thiamine 100 MG tablet Take 100 mg by mouth daily.     Tiotropium Bromide-Olodaterol (STIOLTO RESPIMAT) 2.5-2.5 MCG/ACT AERS Inhale 2 puffs into the lungs daily. Pt needs to make an appointment to continue to receive refills please. 1 each 0   traZODone (DESYREL) 50 MG tablet Take 1 tablet (50 mg total) by mouth at bedtime. 30 tablet 0   No current facility-administered medications for this visit.    No Known Allergies    Social History   Socioeconomic History   Marital status: Divorced    Spouse name: Not on file   Number of children: Not on file   Years of education: Not on file   Highest education level: Not on file  Occupational History   Not on file  Tobacco Use   Smoking status: Former    Current packs/day: 0.00    Types: Cigarettes    Quit date: 2021    Years since  quitting: 4.1   Smokeless tobacco: Never  Vaping Use   Vaping status: Never Used  Substance and Sexual Activity   Alcohol use: Yes    Alcohol/week: 3.0 standard drinks of alcohol    Types: 3 Cans of beer per week    Comment: daily   Drug use: Not Currently   Sexual activity: Not on file  Other Topics Concern   Not on file  Social History Narrative   Not on file   Social Drivers of Health   Financial Resource Strain: Not on file  Food Insecurity: Not on file  Transportation Needs: Not on file  Physical Activity: Not on file  Stress: Not on file  Social Connections: Not on file  Intimate Partner Violence: Not on file     No family history on file.     PHYSICAL EXAM: General:  Well appearing. No respiratory difficulty HEENT: normal Neck: supple. no JVD. Carotids 2+ bilat; no bruits. No lymphadenopathy or thyromegaly appreciated. Cor: PMI nondisplaced. Regular rate &  rhythm. No rubs, gallops or murmurs. Lungs: clear Abdomen: soft, nontender, nondistended. No hepatosplenomegaly. No bruits or masses. Good bowel sounds. Extremities: no cyanosis, clubbing, rash, edema Neuro: alert & oriented x 3, cranial nerves grossly intact. moves all 4 extremities w/o difficulty. Affect pleasant.  ECG:   ASSESSMENT & PLAN:  1: Chronic heart failure with preserved ejection fraction - - suspect due to  - NYHA class II - euvolemic today - weighing daily; reminded to call for an overnight weight gain of > 2 pounds or a weekly weight gain of > 5 pounds - Echo 08/18/20: EF of 60-65% along with mild LVH and moderate AS. - will get echo updated - rarely adding salt and his sister has been reading food labels for sodium content - BNP 11/25/20 was 575.8  2: HTN- - BP  - follows with Darin [facility]  - BMP 11/27/20 reviewed and showed sodium 143, potassium 4.3, creatinine 1.47 and GFR 54  3: COPD- - wearing oxygen at 2-3L around the clock at home although he decreases it to 2L when he's out so that it will last longer - saw pulmonology Darin Hopkins) 05/23 - he had cancelled last pulmonology appointment due to sister's death; emphasized that he call and get this rescheduled - continues to not smoke

## 2023-07-07 ENCOUNTER — Encounter: Payer: Self-pay | Admitting: Family

## 2023-07-07 ENCOUNTER — Ambulatory Visit: Payer: Medicaid Other | Attending: Family | Admitting: Family

## 2023-07-07 VITALS — BP 115/82 | HR 67 | Wt 129.4 lb

## 2023-07-07 DIAGNOSIS — I5032 Chronic diastolic (congestive) heart failure: Secondary | ICD-10-CM | POA: Diagnosis not present

## 2023-07-07 DIAGNOSIS — J449 Chronic obstructive pulmonary disease, unspecified: Secondary | ICD-10-CM | POA: Diagnosis present

## 2023-07-07 DIAGNOSIS — I10 Essential (primary) hypertension: Secondary | ICD-10-CM

## 2023-07-07 DIAGNOSIS — Z9981 Dependence on supplemental oxygen: Secondary | ICD-10-CM | POA: Insufficient documentation

## 2023-07-07 DIAGNOSIS — I110 Hypertensive heart disease with heart failure: Secondary | ICD-10-CM | POA: Diagnosis not present

## 2023-07-07 DIAGNOSIS — Z79899 Other long term (current) drug therapy: Secondary | ICD-10-CM | POA: Diagnosis not present

## 2023-07-07 DIAGNOSIS — J431 Panlobular emphysema: Secondary | ICD-10-CM | POA: Diagnosis not present

## 2023-07-07 NOTE — Patient Instructions (Addendum)
 Lab Work:  Go DOWN to LOWER LEVEL (LL) to have your blood work completed inside of [employer] office.  We will only call you if the results are abnormal or if the provider would like to make medication changes.  Referrals:  We have referred you to Pulmonology for your COPD. You can go downstairs to the 1st floor and get scheduled with them or you can wait for them to give you a call within 1-2 weeks. If they have not reached out to by then, please call them.  [facility] [address], [place], [place] [address] [phone]  Follow-Up in: 1 month  At the [facility], you and your health needs are our priority. We have a designated team specialized in the treatment of Heart Failure. This Care Team includes your primary Heart Failure Specialized Cardiologist (physician), Advanced Practice Providers (APPs- Physician Assistants and Nurse Practitioners), and Pharmacist who all work together to provide you with the care you need, when you need it.   You may see any of the following providers on your designated Care Team at your next follow up:  Dr. Arvilla Meres Dr. Marca Ancona Dr. Dorthula Nettles Dr. Theresia Bough Tonye Becket, NP Robbie Lis, [place] [facility] [place], [place] Brynda Peon, NP [place] Lee, NP Karle Plumber, PharmD   Please be sure to bring in all your medications bottles to every appointment.   Need to Contact us:  If you have any questions or concerns before your next appointment please send [place] a message through [place] or call our office at [phone].    TO LEAVE A MESSAGE FOR THE NURSE SELECT OPTION 2, PLEASE LEAVE A MESSAGE INCLUDING: YOUR NAME DATE OF BIRTH CALL BACK NUMBER REASON FOR CALL**this is important as we prioritize the call backs  YOU WILL RECEIVE A CALL BACK THE SAME DAY AS LONG AS YOU CALL BEFORE 4:00 PM

## 2023-07-08 LAB — BASIC METABOLIC PANEL
BUN/Creatinine Ratio: 22 (ref 10–24)
BUN: 48 mg/dL — ABNORMAL HIGH (ref 8–27)
CO2: 27 mmol/L (ref 20–29)
Calcium: 9.4 mg/dL (ref 8.6–10.2)
Chloride: 107 mmol/L — ABNORMAL HIGH (ref 96–106)
Creatinine, Ser: 2.21 mg/dL — ABNORMAL HIGH (ref 0.76–1.27)
Glucose: 94 mg/dL (ref 70–99)
Potassium: 5 mmol/L (ref 3.5–5.2)
Sodium: 147 mmol/L — ABNORMAL HIGH (ref 134–144)
eGFR: 33 mL/min/{1.73_m2} — ABNORMAL LOW (ref >59)

## 2023-07-08 LAB — CBC
Hematocrit: 30.8 % — ABNORMAL LOW (ref 37.5–51.0)
Hemoglobin: 9.9 g/dL — ABNORMAL LOW (ref 13.0–17.7)
MCH: 29.9 pg (ref 26.6–33.0)
MCHC: 32.1 g/dL (ref 31.5–35.7)
MCV: 93 fL (ref 79–97)
Platelets: 152 10*3/uL (ref 150–450)
RBC: 3.31 x10E6/uL — ABNORMAL LOW (ref 4.14–5.80)
RDW: 11.1 % — ABNORMAL LOW (ref 11.6–15.4)
WBC: 11.3 10*3/uL — ABNORMAL HIGH (ref 3.4–10.8)

## 2023-07-08 LAB — LIPID PANEL
Chol/HDL Ratio: 2.8 ratio (ref 0.0–5.0)
Cholesterol, Total: 157 mg/dL (ref 100–199)
HDL: 57 mg/dL (ref >39)
LDL Chol Calc (NIH): 84 mg/dL (ref 0–99)
Triglycerides: 87 mg/dL (ref 0–149)
VLDL Cholesterol Cal: 16 mg/dL (ref 5–40)

## 2023-08-08 ENCOUNTER — Encounter: Admitting: Pulmonary Disease

## 2023-08-11 ENCOUNTER — Encounter: Payer: Medicaid Other | Admitting: Family

## 2023-08-12 ENCOUNTER — Telehealth: Payer: Self-pay | Admitting: Family

## 2023-08-12 NOTE — Telephone Encounter (Incomplete)
 Called to confirm/remind patient of their appointment at the [facility] on 08/15/23***.   Appointment:   [] Confirmed  [] Left mess   [x] No answer/No voice mail  [] Phone not in service  Patient reminded to bring all medications and/or complete list.  Confirmed patient has transportation. Gave directions, instructed to utilize valet parking.

## 2023-08-14 NOTE — Progress Notes (Deleted)
 [facility] Note   PCP: Darin [facility] (last seen ~ 10/24) Cardiologist: None      Chief Complaint:   HPI:  [name] is a 63 y/o male with a history of HTN, COPD, seizures, previous tobacco/ alcohol use, hyperlipidemia, GERD, CKD, CVA (~2020) and chronic heart failure.   Admitted 11/25/20 with shortness of breath. Initially needed treatment with bipap with improvement of symptoms. Last alcohol was 12/21. Initially diuresed.   Echo 08/18/20: EF of 60-65% along with mild LVH and moderate AS.  He presents today, with his sister, for a HF follow-up visit with a chief complaint of     Continues to wear his oxygen at 4L around the clock.   Meds are bubblepacked through his pharmacy.    ROS: All systems negative except as listed in HPI, PMH and Problem list   Past Medical History:  Diagnosis Date   CHF (congestive heart failure) (HCC)    COPD (chronic obstructive pulmonary disease) (HCC)    Hypertension    Seizures (HCC)     Current Outpatient Medications  Medication Sig Dispense Refill   amLODipine  (NORVASC ) 10 MG tablet Take 10 mg by mouth every evening.     aspirin  81 MG EC tablet Take 81 mg by mouth in the morning.     atorvastatin  (LIPITOR) 20 MG tablet Take 20 mg by mouth every evening.     bisoprolol  (ZEBETA ) 5 MG tablet One twice daily 60 tablet 11   Cholecalciferol (VITAMIN D -3) 25 MCG (1000 UT) CAPS Take 2 capsules (2,000 Units total) by mouth in the morning. 60 capsule 0   doxazosin  (CARDURA ) 2 MG tablet Take 2 mg by mouth every evening.     folic acid  (FOLVITE ) 1 MG tablet Take 1 mg by mouth daily.     furosemide  (LASIX ) 40 MG tablet Take 40 mg by mouth daily.     hydrALAZINE  (APRESOLINE ) 50 MG tablet Take 0.5 tablets (25 mg total) by mouth 3 (three) times daily. 270 tablet 3   levETIRAcetam  (KEPPRA ) 750 MG tablet Take 1 tablet (750 mg total) by mouth 2 (two) times daily. 60 tablet 2   Multiple Vitamin (MULTIVITAMIN ADULT) TABS Take 2 tablets by  mouth daily.     omeprazole (PRILOSEC) 20 MG capsule Take 20 mg by mouth daily before breakfast.     potassium chloride  (KLOR-CON ) 10 MEQ tablet Take 10 mEq by mouth daily.     PROAIR  HFA 108 (90 Base) MCG/ACT inhaler Inhale 2 puffs into the lungs every 6 (six) hours as needed for shortness of breath or wheezing.     thiamine  100 MG tablet Take 100 mg by mouth daily.     Tiotropium Bromide -Olodaterol (STIOLTO RESPIMAT ) 2.5-2.5 MCG/ACT AERS Inhale 2 puffs into the lungs daily. Pt needs to make an appointment to continue to receive refills please. 1 each 0   traZODone  (DESYREL ) 50 MG tablet Take 1 tablet (50 mg total) by mouth at bedtime. 30 tablet 0   No current facility-administered medications for this visit.    No Known Allergies    Social History   Socioeconomic History   Marital status: Divorced    Spouse name: Not on file   Number of children: Not on file   Years of education: Not on file   Highest education level: Not on file  Occupational History   Not on file  Tobacco Use   Smoking status: Former    Current packs/day: 0.00    Types: Cigarettes  Quit date: 2021    Years since quitting: 4.2   Smokeless tobacco: Never  Vaping Use   Vaping status: Never Used  Substance and Sexual Activity   Alcohol use: Yes    Alcohol/week: 3.0 standard drinks of alcohol    Types: 3 Cans of beer per week    Comment: daily   Drug use: Not Currently   Sexual activity: Not on file  Other Topics Concern   Not on file  Social History Narrative   Not on file   Social Drivers of Health   Financial Resource Strain: Not on file  Food Insecurity: Not on file  Transportation Needs: Not on file  Physical Activity: Not on file  Stress: Not on file  Social Connections: Not on file  Intimate Partner Violence: Not on file     No family history on file.     PHYSICAL EXAM:  General: Well appearing, thin. No resp difficulty HEENT: normal Neck: supple, no JVD Cor: Regular rhythm,  rate. No rubs, gallops or murmurs Lungs: clear Abdomen: soft, nontender, nondistended. Extremities: no cyanosis, clubbing, rash, edema Neuro: alert & oriented X 3. Moves all 4 extremities w/o difficulty. Affect pleasant   ECG: not done   ASSESSMENT & PLAN:  1: Chronic heart failure with preserved ejection fraction - - suspect due to COPD/ previous alcohol use - NYHA class II - euvolemic today - weighing 129.4 from last visit here 6 weeks ago - Echo 08/18/20: EF of 60-65% along with mild LVH and moderate AS. - will get echo updated - continue bisoprolol  5mg  BID - continue furosemide  40mg  daily/ potassium 10meq daily - BP may not tolerate MRA; consider SGLT2 - rarely adding salt and his sister has been reading food labels for sodium content  - BNP 11/25/20 was 575.8  2: HTN- - BP - continue amlodipine  10mg  daily - continue hydralazine  25mg  TID - follows with [facility]  - BMP 07/07/23 reviewed and showed sodium 147, potassium 5.0, creatinine 2.21 and GFR 33   3: COPD- - wearing oxygen at 3.5-4L around the clock - saw pulmonology Waymond Hailey) 05/23; returns 05/25 - remains a non-smoker     Darin Hopkins, [place] 08/15/23

## 2023-08-15 ENCOUNTER — Other Ambulatory Visit (HOSPITAL_COMMUNITY): Payer: Self-pay

## 2023-08-15 ENCOUNTER — Telehealth: Payer: Self-pay | Admitting: Family

## 2023-08-15 ENCOUNTER — Encounter: Admitting: Family

## 2023-08-15 NOTE — Telephone Encounter (Signed)
 Patient did not show for his Heart Failure Clinic appointment on 08/15/23.

## 2023-09-14 ENCOUNTER — Encounter: Admitting: Internal Medicine

## 2023-09-14 NOTE — Progress Notes (Deleted)
 [facility] [place] Pulmonary Medicine Consultation      Date: 09/14/2023,   MRN# [id number] Darin Hopkins 10-29-1960   63 yo former smoker stopped summer 2021 with asthma as child with dx of copd around 2019 on daily inhalers and 02 x 2022 with chronic hypercarbic and hypoxemic Resp failure referred to pulmonary clinic in [facility]  01/16/2021   CHIEF COMPLAINT:   Assessment of COPD Assessment of hypoxic respiratory failure   HISTORY OF PRESENT ILLNESS      PAST MEDICAL HISTORY   Past Medical History:  Diagnosis Date   CHF (congestive heart failure) (HCC)    COPD (chronic obstructive pulmonary disease) (HCC)    Hypertension    Seizures (HCC)      SURGICAL HISTORY   No past surgical history on file.   FAMILY HISTORY   No family history on file.   SOCIAL HISTORY   Social History   Tobacco Use   Smoking status: Former    Current packs/day: 0.00    Types: Cigarettes    Quit date: 2021    Years since quitting: 4.3   Smokeless tobacco: Never  Vaping Use   Vaping status: Never Used  Substance Use Topics   Alcohol use: Yes    Alcohol/week: 3.0 standard drinks of alcohol    Types: 3 Cans of beer per week    Comment: daily   Drug use: Not Currently     MEDICATIONS    Home Medication:  Current Outpatient Rx   Order #: [id number] Class: Historical Med   Order #: [id number] Class: Historical Med   Order #: [id number] Class: Historical Med   Order #: [id number] Class: Normal   Order #: [id number] Class: No Print   Order #: [id number] Class: Historical Med   Order #: [id number] Class: Historical Med   Order #: [id number] Class: Historical Med   Order #: [id number] Class: No Print   Order #: [id number] Class: Normal   Order #: [id number] Class: Historical Med   Order #: [id number] Class: Historical Med   Order #: [id number] Class: Historical Med   Order #: [id number] Class: Historical Med   Order #: [id number] Class: Historical Med   Order #: [id number] Class: Normal   Order #:  [id number] Class: No Print    Current Medication:  Current Outpatient Medications:    amLODipine  (NORVASC ) 10 MG tablet, Take 10 mg by mouth every evening., Disp: , Rfl:    aspirin  81 MG EC tablet, Take 81 mg by mouth in the morning., Disp: , Rfl:    atorvastatin  (LIPITOR) 20 MG tablet, Take 20 mg by mouth every evening., Disp: , Rfl:    bisoprolol  (ZEBETA ) 5 MG tablet, One twice daily, Disp: 60 tablet, Rfl: 11   Cholecalciferol (VITAMIN D -3) 25 MCG (1000 UT) CAPS, Take 2 capsules (2,000 Units total) by mouth in the morning., Disp: 60 capsule, Rfl: 0   doxazosin  (CARDURA ) 2 MG tablet, Take 2 mg by mouth every evening., Disp: , Rfl:    folic acid  (FOLVITE ) 1 MG tablet, Take 1 mg by mouth daily., Disp: , Rfl:    furosemide  (LASIX ) 40 MG tablet, Take 40 mg by mouth daily., Disp: , Rfl:    hydrALAZINE  (APRESOLINE ) 50 MG tablet, Take 0.5 tablets (25 mg total) by mouth 3 (three) times daily., Disp: 270 tablet, Rfl: 3   levETIRAcetam  (KEPPRA ) 750 MG tablet, Take 1 tablet (750 mg total) by mouth 2 (two) times daily., Disp: 60 tablet, Rfl: 2   Multiple Vitamin (MULTIVITAMIN ADULT) TABS, Take 2 tablets by mouth daily., Disp: , Rfl:    omeprazole (  PRILOSEC) 20 MG capsule, Take 20 mg by mouth daily before breakfast., Disp: , Rfl:    potassium chloride  (KLOR-CON ) 10 MEQ tablet, Take 10 mEq by mouth daily., Disp: , Rfl:    PROAIR  HFA 108 (90 Base) MCG/ACT inhaler, Inhale 2 puffs into the lungs every 6 (six) hours as needed for shortness of breath or wheezing., Disp: , Rfl:    thiamine  100 MG tablet, Take 100 mg by mouth daily., Disp: , Rfl:    Tiotropium Bromide -Olodaterol (STIOLTO RESPIMAT ) 2.5-2.5 MCG/ACT AERS, Inhale 2 puffs into the lungs daily. Pt needs to make an appointment to continue to receive refills please., Disp: 1 each, Rfl: 0   traZODone  (DESYREL ) 50 MG tablet, Take 1 tablet (50 mg total) by mouth at bedtime., Disp: 30 tablet, Rfl: 0    ALLERGIES   Patient has no known allergies.   There  were no vitals taken for this visit.   Review of Systems: Gen:  Denies  fever, sweats, chills weight loss  HEENT: Denies blurred vision, double vision, ear pain, eye pain, hearing loss, nose bleeds, sore throat Cardiac:  No dizziness, chest pain or heaviness, chest tightness,edema, No JVD Resp:   No cough, -sputum production, -shortness of breath,-wheezing, -hemoptysis,  Other:  All other systems negative   Physical Examination:   General Appearance: No distress  EYES PERRLA, EOM intact.   NECK Supple, No JVD Pulmonary: normal breath sounds, No wheezing.  CardiovascularNormal S1,S2.  No m/r/g.   Abdomen: Benign, Soft, non-tender. Neurology UE/LE 5/5 strength, no focal deficits Ext pulses intact, cap refill intact ALL OTHER ROS ARE NEGATIVE      IMAGING    No results found.    ASSESSMENT/PLAN                   MEDICATION ADJUSTMENTS/LABS AND TESTS ORDERED:    CURRENT MEDICATIONS REVIEWED AT LENGTH WITH PATIENT TODAY   Patient  satisfied with Plan of action and management. All questions answered  Follow up   I spent a total of *** minutes reviewing chart data, face-to-face evaluation with the patient, counseling and coordination of care as detailed above.     Lady Pier, M.D.  [facility]  Medical Director [facility] Medical Director [facility] Cardio-Pulmonary Department

## 2023-09-19 ENCOUNTER — Telehealth: Payer: Self-pay | Admitting: Family

## 2023-09-19 NOTE — Progress Notes (Unsigned)
 Advanced Heart Failure Clinic Note   PCP: [facility] (last seen ~ 10/24) Cardiologist: None      Chief Complaint: shortness of breath  HPI:  Darin Hopkins is a 63 y/o male with a history of HTN, COPD, seizures, previous tobacco/ alcohol use and chronic heart failure.   Has not been admitted or been in the ED in the last 6 months.   Echo 08/18/20: EF of 60-65% along with mild LVH and moderate AS.  He presents today, with his sister, for a HF follow-up visit (although hasn't been seen since 10/23) with a chief complaint of shortness of breath with  exertion. Has associated occasional dizziness with this. Sleeping well on 2 pillows with his oxygen on. Denies fatigue, chest pain, cough, palpitations, abdominal distention, pedal edema or weight gain. Overall he reports feeling good.    Continues to wear his oxygen at 4L around the clock.   Meds are bubblepacked through his pharmacy. Sees Dr Willene Harper at [facility] for primary care and was last seen ~ 10/24.    ROS: All systems negative except as listed in HPI, PMH and Problem list   Past Medical History:  Diagnosis Date   CHF (congestive heart failure) (HCC)    COPD (chronic obstructive pulmonary disease) (HCC)    Hypertension    Seizures (HCC)     Current Outpatient Medications  Medication Sig Dispense Refill   amLODipine  (NORVASC ) 10 MG tablet Take 10 mg by mouth every evening.     aspirin  81 MG EC tablet Take 81 mg by mouth in the morning.     atorvastatin  (LIPITOR) 20 MG tablet Take 20 mg by mouth every evening.     bisoprolol  (ZEBETA ) 5 MG tablet One twice daily 60 tablet 11   Cholecalciferol (VITAMIN D -3) 25 MCG (1000 UT) CAPS Take 2 capsules (2,000 Units total) by mouth in the morning. 60 capsule 0   doxazosin  (CARDURA ) 2 MG tablet Take 2 mg by mouth every evening.     folic acid  (FOLVITE ) 1 MG tablet Take 1 mg by mouth daily.     furosemide  (LASIX ) 40 MG tablet Take 40 mg by mouth daily.     hydrALAZINE  (APRESOLINE ) 50 MG  tablet Take 0.5 tablets (25 mg total) by mouth 3 (three) times daily. 270 tablet 3   levETIRAcetam  (KEPPRA ) 750 MG tablet Take 1 tablet (750 mg total) by mouth 2 (two) times daily. 60 tablet 2   Multiple Vitamin (MULTIVITAMIN ADULT) TABS Take 2 tablets by mouth daily.     omeprazole (PRILOSEC) 20 MG capsule Take 20 mg by mouth daily before breakfast.     potassium chloride  (KLOR-CON ) 10 MEQ tablet Take 10 mEq by mouth daily.     PROAIR  HFA 108 (90 Base) MCG/ACT inhaler Inhale 2 puffs into the lungs every 6 (six) hours as needed for shortness of breath or wheezing.     thiamine  100 MG tablet Take 100 mg by mouth daily.     Tiotropium Bromide -Olodaterol (STIOLTO RESPIMAT ) 2.5-2.5 MCG/ACT AERS Inhale 2 puffs into the lungs daily. Pt needs to make an appointment to continue to receive refills please. 1 each 0   traZODone  (DESYREL ) 50 MG tablet Take 1 tablet (50 mg total) by mouth at bedtime. 30 tablet 0   No current facility-administered medications for this visit.    No Known Allergies    Social History   Socioeconomic History   Marital status: Divorced    Spouse name: Not on file   Number  of children: Not on file   Years of education: Not on file   Highest education level: Not on file  Occupational History   Not on file  Tobacco Use   Smoking status: Former    Current packs/day: 0.00    Types: Cigarettes    Quit date: 2021    Years since quitting: 4.3   Smokeless tobacco: Never  Vaping Use   Vaping status: Never Used  Substance and Sexual Activity   Alcohol use: Yes    Alcohol/week: 3.0 standard drinks of alcohol    Types: 3 Cans of beer per week    Comment: daily   Drug use: Not Currently   Sexual activity: Not on file  Other Topics Concern   Not on file  Social History Narrative   Not on file   Social Drivers of Health   Financial Resource Strain: Not on file  Food Insecurity: Not on file  Transportation Needs: Not on file  Physical Activity: Not on file  Stress:  Not on file  Social Connections: Not on file  Intimate Partner Violence: Not on file     No family history on file.  There were no vitals filed for this visit.  Wt Readings from Last 3 Encounters:  07/07/23 129 lb 6.4 oz (58.7 kg)  02/11/22 136 lb 6 oz (61.9 kg)  12/03/21 136 lb (61.7 kg)   Lab Results  Component Value Date   CREATININE 2.21 (H) 07/07/2023   CREATININE 1.47 (H) 11/27/2020   CREATININE 1.57 (H) 11/26/2020    PHYSICAL EXAM:  General: Well appearing, thin. No resp difficulty HEENT: normal Neck: supple, no JVD Cor: Regular rhythm, rate. No rubs, gallops or murmurs Lungs: clear Abdomen: soft, nontender, nondistended. Extremities: no cyanosis, clubbing, rash, edema Neuro: alert & oriented X 3. Moves all 4 extremities w/o difficulty. Affect pleasant   ECG: not done   ASSESSMENT & PLAN:  1: Chronic heart failure with preserved ejection fraction - - suspect due to COPD/ previous alcohol use - NYHA class II - euvolemic today - weighing daily; reminded to call for an overnight weight gain of > 2 pounds or a weekly weight gain of > 5 pounds - Echo 08/18/20: EF of 60-65% along with mild LVH and moderate AS. - will get echo updated - continue bisoprolol  5mg  BID - continue furosemide  40mg  daily/ potassium 10meq daily - BP may not tolerate MRA; consider SGLT2 at next visit after echo done - rarely adding salt and his sister has been reading food labels for sodium content - BMET, CBC, lipid panel today - BNP 11/25/20 was 575.8  2: HTN- - BP 115/82 - continue amlodipine  10mg  daily - continue hydralazine  25mg  TID - follows with [facility]  - BMP 11/27/20 reviewed and showed sodium 143, potassium 4.3, creatinine 1.47 and GFR 54 - BMET today  3: COPD- - wearing oxygen at 3.5-4L around the clock - saw pulmonology [place]) 05/23 - pulmonology referral placed so he can get f/u appointment scheduled.  - remains a non-smoker   Return in 1 month, sooner if  needed. Will make cardiology referral at that time as sister gets overwhelmed with too much information at one time.   [name], FNP

## 2023-09-19 NOTE — Telephone Encounter (Signed)
 Called to confirm/remind patient of their appointment at the Advanced Heart Failure Clinic on 09/20/23.   Appointment:   [x] Confirmed  [] Left mess   [] No answer/No voice mail  [] VM Full/unable to leave message  [] Phone not in service  Patient reminded to bring all medications and/or complete list.  Confirmed patient has transportation. Gave directions, instructed to utilize valet parking.

## 2023-09-20 ENCOUNTER — Encounter: Payer: Self-pay | Admitting: Family

## 2023-09-20 ENCOUNTER — Other Ambulatory Visit (HOSPITAL_COMMUNITY): Payer: Self-pay

## 2023-09-20 ENCOUNTER — Telehealth: Payer: Self-pay

## 2023-09-20 ENCOUNTER — Ambulatory Visit: Attending: Family | Admitting: Family

## 2023-09-20 VITALS — BP 130/85 | HR 66 | Wt 126.0 lb

## 2023-09-20 DIAGNOSIS — Z79899 Other long term (current) drug therapy: Secondary | ICD-10-CM | POA: Insufficient documentation

## 2023-09-20 DIAGNOSIS — I428 Other cardiomyopathies: Secondary | ICD-10-CM | POA: Diagnosis not present

## 2023-09-20 DIAGNOSIS — I110 Hypertensive heart disease with heart failure: Secondary | ICD-10-CM | POA: Insufficient documentation

## 2023-09-20 DIAGNOSIS — Z9981 Dependence on supplemental oxygen: Secondary | ICD-10-CM | POA: Insufficient documentation

## 2023-09-20 DIAGNOSIS — J431 Panlobular emphysema: Secondary | ICD-10-CM

## 2023-09-20 DIAGNOSIS — E782 Mixed hyperlipidemia: Secondary | ICD-10-CM

## 2023-09-20 DIAGNOSIS — I5032 Chronic diastolic (congestive) heart failure: Secondary | ICD-10-CM | POA: Insufficient documentation

## 2023-09-20 DIAGNOSIS — I10 Essential (primary) hypertension: Secondary | ICD-10-CM | POA: Diagnosis not present

## 2023-09-20 DIAGNOSIS — J449 Chronic obstructive pulmonary disease, unspecified: Secondary | ICD-10-CM | POA: Diagnosis not present

## 2023-09-20 DIAGNOSIS — E785 Hyperlipidemia, unspecified: Secondary | ICD-10-CM | POA: Diagnosis not present

## 2023-09-20 DIAGNOSIS — Z87891 Personal history of nicotine dependence: Secondary | ICD-10-CM | POA: Insufficient documentation

## 2023-09-20 MED ORDER — FARXIGA 10 MG PO TABS: 10.0000 mg | ORAL_TABLET | Freq: Every day | ORAL | 5 refills | Status: DC

## 2023-09-20 NOTE — Telephone Encounter (Signed)
 Patient Advocate Encounter  Prior authorization for Farxiga has been submitted and approved. Test billing returns $4 for 90 day supply.  KeyEligio Grumbling Effective: 09/20/2023 to 09/19/2024  Kennis Peacock, CPhT Rx Patient Advocate Phone: [phone]

## 2023-09-20 NOTE — Patient Instructions (Addendum)
 Medication Changes:  STOP taking Doxazosin   BEGIN Farxiga 10 MG once daily   Labs: Go to [facility] for lab work and they will fax results to our office.  Testing/Procedures:  Your physician has requested that you have an echocardiogram. Echocardiography is a painless test that uses sound waves to create images of your heart. It provides your doctor with information about the size and shape of your heart and how well your heart's chambers and valves are working. This procedure takes approximately one hour. There are no restrictions for this procedure. Please do NOT wear cologne, perfume, aftershave, or lotions (deodorant is allowed). Please arrive 15 minutes prior to your appointment time.  Please note: We ask at that you not bring children with you during ultrasound (echo/ vascular) testing. Due to room size and safety concerns, children are not allowed in the ultrasound rooms during exams. Our front office staff cannot provide observation of children in our lobby area while testing is being conducted. An adult accompanying a patient to their appointment will only be allowed in the ultrasound room at the discretion of the ultrasound technician under special circumstances. We apologize for any inconvenience.    Special Instructions // Education:  Do the following things EVERYDAY: Weigh yourself in the morning before breakfast. Write it down and keep it in a log. Take your medicines as prescribed Eat low salt foods--Limit salt (sodium) to 2000 mg per day.  Stay as active as you can everyday Limit all fluids for the day to less than 2 liters   Follow-Up:   With [name], NP, with an echocardiogram after the appointment with Brian Campanile.    If you have any questions or concerns before your next appointment please send us  a message through [place] or call our office at [phone], If it is after office hours your call will be answered by our answering service and directed appropriately.      At the Advanced Heart Failure Clinic, you and your health needs are our priority. We have a designated team specialized in the treatment of Heart Failure. This Care Team includes your primary Heart Failure Specialized Cardiologist (physician), Advanced Practice Providers (APPs- Physician Assistants and Nurse Practitioners), and Pharmacist who all work together to provide you with the care you need, when you need it.   You may see any of the following providers on your designated Care Team at your next follow up:  Dr. Jules Oar Dr. Peder Bourdon Dr. Alwin Baars Dr. Judyth Nunnery Nieves Bars, NP Ruddy Corral, [place] [address] [place], [place] Dennise Fitz, NP [place] Lee, NP [name], NP Bevely Brush, PharmD

## 2023-09-20 NOTE — Telephone Encounter (Signed)
 Patient Advocate Encounter  Test billing for this patients current plan indicates that Farxiga and Jardiance would both require prior authorization.  Kennis Peacock, CPhT Rx Patient Advocate Phone: [phone]

## 2023-09-20 NOTE — Progress Notes (Signed)
 [facility] - HEART FAILURE CLINIC - PHARMACIST COUNSELING NOTE  Darin Hopkins is a 63 y.o. year old male who presents to the HF clinic for their 1 month follow-up appointment. They have a past medical history significant for HTN, COPD, seizures, previous tobacco/ alcohol use and chronic heart failure. 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 At their last appointment on 07/07/2023, no medication changes were made. Their BP may continue to not be able to tolerate an MRA, but they possibly may tolerate an SGLT-2.    Current Guideline-Directed Medical Therapy (GDMT)  ACE/ARB/ARNI: N/A Beta Blocker: Bisoprolol  5 mg BID Aldosterone Antagonist: NA Diuretic: Furosemide  40 mg daily -- Potassium 10 mEq daily  SGLT2i: NA  Vital Signs and Trends  Recent Trends BP Readings from Last 3 Encounters:  07/07/23 115/82  02/11/22 (!) 134/95  12/03/21 139/88   Wt Readings from Last 3 Encounters:  07/07/23 129 lb 6.4 oz (58.7 kg)  02/11/22 136 lb 6 oz (61.9 kg)  12/03/21 136 lb (61.7 kg)   Today's visit HR 66 BP 130/85 Weight (pounds) 126  Do you check your weight daily? [] Yes [x] No  Do you check you blood pressure daily [] Yes [x] No   Cardiac Imaging  Echo 08/18/20: EF of 60-65% along with mild LVH and moderate AS.   Changes Since Last Visit   Hospitalizations/ED visits (last 6 months): NA Medication changes: NA  Pertinent Labs  Lab Results  Component Value Date   NA 147 (H) 07/07/2023   CL 107 (H) 07/07/2023   K 5.0 07/07/2023   CO2 27 07/07/2023   BUN 48 (H) 07/07/2023   CREATININE 2.21 (H) 07/07/2023   EGFR 33 (L) 07/07/2023   CALCIUM  9.4 07/07/2023   PHOS 4.6 08/27/2020   ALBUMIN 3.9 11/25/2020   GLUCOSE 94 07/07/2023   Lab Results  Component Value Date   LDLCALC 84 07/07/2023   ASSESSMENT  Reason for today's visit   To assess current guideline directed medical  therapy. This is a routine follow-up after they've missed several appointments due to difficulty with transportation. Discussed with them trying to simplify their medication. [name] expressed not wanting to mess with their medications to much since they are in a good spot right now. They were willing to make some small changes to allow for the addition of an SGLT-2 because it has some heart health benefits. They still get their medications packed at [facility].   Guideline-Directed Medical Therapy Evaluation  ACEi/ARB/ARNi Patient is not on therapy because blood pressure wouldn't tolerate  Beta-Blocker  Target Achieved [] Yes [x] No Up-titration candidate today [] Yes [x] No  MRA Patient is not on therapy because patient BP previously was unable to tolerate   SGLT-2 Patient is not on therapy because it has not been added to therapy yet  Prior authorization for Elvina Hammers has been submitted and approved. Test billing returns $4 for 90 day supply   Loop  Increase dose today  [] Yes [x] No  Preventative Care   Parameter Most Recent Result Goal/Status Current Medications  LDL 84 < 70 - 55 mg/dL Just above goal Statin: Atorvastatin  20 mg daily   A1c NA  7 - 8 %  Not diabetic   BP Control 130/85 < 130/80 At goal Additional: Amlodipine  10 mg daily, Hydralazine  25 mg TID, doxazosin  2 mg daily  ECHO 60-65% 6 - 12 months Last 08/18/2020   PLAN  Medication Interventions:  --  Consider adding Farxiga and discontinue doxazosin  since patient's sister expressed wanting to take baby steps for changes  -- Consider in the future titrating the patient off the hydralazine  to help decrease pill burden and add the spironolactone (if renal function can still tolerate) -- If BP still needs better control, can consider adding an ARB for its heart and renal benefits -- Continue current medication regimens per NP Preventative Care Follow-up:  -- Consider increasing their atorvastatin  to 40 mg daily   Lab or imaging orders:  -- Due to annual ECHO  Time spent: 15 minutes  Thank you for allowing pharmacy to participate in this patient's care.   [place] Jenifer K Jordi Lacko, Pharm.D. Pharmacy Resident 09/20/2023 8:07 AM

## 2023-09-21 ENCOUNTER — Telehealth: Payer: Self-pay | Admitting: Licensed Clinical Social Worker

## 2023-09-21 NOTE — Telephone Encounter (Signed)
 H&V Care Navigation CSW Progress Note  Clinical Social Worker contacted patient by phone to f/u on transportation referral. Note pt has Medicaid with transportation benefits. No answer this morning at [phone]. Left voicemail requesting return call. Will re-attempt again as able  Patient is participating in a Managed Medicaid Plan:    SDOH Screenings   Depression (PHQ2-9): Low Risk  (02/11/2022)  Tobacco Use: Medium Risk (09/20/2023)    Nathen Balder, MSW, LCSW Clinical Social Worker II [facility] Heart/Vascular Care Navigation  [phone]- work cell phone (preferred)

## 2023-09-22 ENCOUNTER — Telehealth (HOSPITAL_BASED_OUTPATIENT_CLINIC_OR_DEPARTMENT_OTHER): Payer: Self-pay | Admitting: Licensed Clinical Social Worker

## 2023-09-22 NOTE — Telephone Encounter (Signed)
 H&V Care Navigation CSW Progress Note  Clinical Social Worker contacted patient by phone to f/u on transportation referral. Note pt has Medicaid with transportation benefits. No answer this morning at [phone]. Left 2nd voicemail requesting return call. Will re-attempt again as able   Patient is participating in a Managed Medicaid Plan:  Yes- Healthy Blue    SDOH Screenings   Depression (PHQ2-9): Low Risk  (02/11/2022)  Tobacco Use: Medium Risk (09/20/2023)     Nathen Balder, MSW, LCSW Clinical Social Worker II [facility] Heart/Vascular Care Navigation  [phone]- work cell phone (preferred)

## 2023-09-27 ENCOUNTER — Telehealth: Payer: Self-pay | Admitting: Licensed Clinical Social Worker

## 2023-09-27 NOTE — Telephone Encounter (Addendum)
 H&V Care Navigation CSW Progress Note  Clinical Social Worker contacted patient by phone to f/u on transportation referral. Note pt has Medicaid with transportation benefits. No answer this morning at [phone]. Left 3rd voicemail requesting return call. Will mail resources to pt home address for transportation.   Pt sister Patsey Booth was attempted as well at [phone]- DPR on file. Number is out of service.  Patient is participating in a Managed Medicaid Plan:  No, Healthy Blue   SDOH Screenings   Depression [phone]): Low Risk  (02/11/2022)  Tobacco Use: Medium Risk (09/20/2023)    Nathen Balder, MSW, LCSW Clinical Social Worker II [facility] Heart/Vascular Care Navigation  [phone]- work cell phone (preferred)

## 2023-11-09 ENCOUNTER — Telehealth: Payer: Self-pay | Admitting: Family

## 2023-11-09 NOTE — Progress Notes (Deleted)
 Advanced Heart Failure Clinic Note   PCP: [facility] (last seen ~ 10/24) Cardiologist: None      Chief Complaint: shortness of breath   HPI:  Darin Hopkins is a 63 y/o male with a history of HTN, COPD, seizures, previous tobacco/ alcohol use and chronic heart failure.   Has not been admitted or been in the ED in the last 6 months.   Echo 08/18/20: EF of 60-65% along with mild LVH and moderate AS.  He presents today, with his sister, for a HF follow-up visit with a chief complaint of shortness of breath. Has associated fatigue and occasional palpitations along with this. Denies chest pain, palpitations, abdominal distention, pedal edema, dizziness or difficulty sleeping. Continues to wear his oxygen at 4L around the clock.   Meds are bubblepacked through his pharmacy. Has missed multiple appointment as they are currently having issues with transportation.   ROS: All systems negative except as listed in HPI, PMH and Problem list   Past Medical History:  Diagnosis Date   CHF (congestive heart failure) (HCC)    COPD (chronic obstructive pulmonary disease) (HCC)    Hypertension    Seizures (HCC)     Current Outpatient Medications  Medication Sig Dispense Refill   amLODipine  (NORVASC ) 10 MG tablet Take 10 mg by mouth every evening.     aspirin  81 MG EC tablet Take 81 mg by mouth in the morning.     atorvastatin  (LIPITOR) 20 MG tablet Take 20 mg by mouth every evening.     bisoprolol  (ZEBETA ) 5 MG tablet One twice daily 60 tablet 11   Cholecalciferol (VITAMIN D -3) 25 MCG (1000 UT) CAPS Take 2 capsules (2,000 Units total) by mouth in the morning. 60 capsule 0   FARXIGA  10 MG TABS tablet Take 1 tablet (10 mg total) by mouth daily before breakfast. 30 tablet 5   folic acid  (FOLVITE ) 1 MG tablet Take 1 mg by mouth daily.     furosemide  (LASIX ) 40 MG tablet Take 40 mg by mouth daily.     hydrALAZINE  (APRESOLINE ) 50 MG tablet Take 0.5 tablets (25 mg total) by mouth 3 (three) times daily.  270 tablet 3   levETIRAcetam  (KEPPRA ) 750 MG tablet Take 1 tablet (750 mg total) by mouth 2 (two) times daily. 60 tablet 2   Multiple Vitamin (MULTIVITAMIN ADULT) TABS Take 2 tablets by mouth daily.     omeprazole (PRILOSEC) 20 MG capsule Take 20 mg by mouth daily before breakfast.     potassium chloride  (KLOR-CON ) 10 MEQ tablet Take 10 mEq by mouth daily.     PROAIR  HFA 108 (90 Base) MCG/ACT inhaler Inhale 2 puffs into the lungs every 6 (six) hours as needed for shortness of breath or wheezing.     thiamine  100 MG tablet Take 100 mg by mouth daily.     Tiotropium Bromide -Olodaterol (STIOLTO RESPIMAT ) 2.5-2.5 MCG/ACT AERS Inhale 2 puffs into the lungs daily. Pt needs to make an appointment to continue to receive refills please. 1 each 0   traZODone  (DESYREL ) 50 MG tablet Take 1 tablet (50 mg total) by mouth at bedtime. 30 tablet 0   No current facility-administered medications for this visit.    No Known Allergies    Social History   Socioeconomic History   Marital status: Divorced    Spouse name: Not on file   Number of children: Not on file   Years of education: Not on file   Highest education level: Not on file  Occupational History   Not on file  Tobacco Use   Smoking status: Former    Current packs/day: 0.00    Types: Cigarettes    Quit date: 2021    Years since quitting: 4.5   Smokeless tobacco: Never  Vaping Use   Vaping status: Never Used  Substance and Sexual Activity   Alcohol use: Yes    Alcohol/week: 3.0 standard drinks of alcohol    Types: 3 Cans of beer per week    Comment: daily   Drug use: Not Currently   Sexual activity: Not on file  Other Topics Concern   Not on file  Social History Narrative   Not on file   Social Drivers of Health   Financial Resource Strain: Not on file  Food Insecurity: Not on file  Transportation Needs: Not on file  Physical Activity: Not on file  Stress: Not on file  Social Connections: Not on file  Intimate Partner  Violence: Not on file     No family history on file.  There were no vitals filed for this visit.  Wt Readings from Last 3 Encounters:  09/20/23 126 lb (57.2 kg)  07/07/23 129 lb 6.4 oz (58.7 kg)  02/11/22 136 lb 6 oz (61.9 kg)   Lab Results  Component Value Date   CREATININE 2.21 (H) 07/07/2023   CREATININE 1.47 (H) 11/27/2020   CREATININE 1.57 (H) 11/26/2020    PHYSICAL EXAM:  General: Well appearing, thin. No resp difficulty HEENT: normal Neck: supple, no JVD Cor: Regular rhythm, rate. No rubs, gallops or murmurs Lungs: clear Abdomen: soft, nontender, nondistended. Extremities: no cyanosis, clubbing, rash, edema Neuro: alert & oriented X 3. Moves all 4 extremities w/o difficulty. Affect pleasant   ECG: not done   ASSESSMENT & PLAN:  1: NICM with preserved ejection fraction - - suspect due to COPD/ previous alcohol use - NYHA Hopkins II - euvolemic today - weighing daily; reminded to call for an overnight weight gain of > 2 pounds or a weekly weight gain of > 5 pounds - weight down 3 pounds from last visit here 2.5 months ago - Echo 08/18/20: EF of 60-65% along with mild LVH and moderate AS. - NS for last echo; have r/s this for 11/10/23 - continue bisoprolol  5mg  BID - continue furosemide  40mg  daily/ potassium 10meq daily - begin farxiga  10mg  daily and stop doxazosin   - BP may not tolerate MRA - they can't stay for labs; BMET order faxed to PCP office - social work consult placed to assist with transportation - rarely adding salt and his sister has been reading food labels for sodium content - BNP 11/25/20 was 575.8  2: HTN- - BP 130/85 - continue amlodipine  10mg  daily - continue hydralazine  25mg  TID; would like to get off hydralazine  to ease pill burden - follows with [facility]  - BMP 07/07/23 reviewed: sodium 147, potassium 5.0, creatinine 2.21 and GFR 33  3: COPD- - wearing oxygen at 3.5-4L around the clock - saw pulmonology [place]) 05/23 - remains a  non-smoker  4: Hyperlipidemia- - continue atorvastatin  20mg  daily - LDL 07/07/23 was 84   Return in 2 months, sooner if needed.   Darin Hopkins, [place] 09/20/23

## 2023-11-09 NOTE — Telephone Encounter (Signed)
 Called to confirm/remind patient of their appointment at the Advanced Heart Failure Clinic on 11/10/23.   Appointment:   [] Confirmed  [x] Left mess   [] No answer/No voice mail  [] VM Full/unable to leave message  [] Phone not in service  Patient reminded to bring all medications and/or complete list.  Confirmed patient has transportation. Gave directions, instructed to utilize valet parking.

## 2023-11-10 ENCOUNTER — Ambulatory Visit: Admission: RE | Admit: 2023-11-10 | Source: Ambulatory Visit

## 2023-11-10 ENCOUNTER — Telehealth: Payer: Self-pay | Admitting: Family

## 2023-11-10 ENCOUNTER — Encounter: Admitting: Family

## 2023-11-10 NOTE — Telephone Encounter (Signed)
 Patient did not show for his Heart Failure Clinic appointment on 11/10/23. This is the 6th appointment that he has missed.

## 2023-12-09 LAB — LAB REPORT - SCANNED: EGFR: 26

## 2023-12-14 ENCOUNTER — Telehealth: Payer: Self-pay | Admitting: Family

## 2023-12-14 NOTE — Telephone Encounter (Signed)
 Lab results from [facility] dated 12/08/23:  Hg 9.6 Platelet 148 Glucose 93 Creatinine 2.689 BUN 66 eGFR 26 Potassium 4.4 Sodium 143 Cholesterol 164 LDL 93

## 2024-02-01 ENCOUNTER — Emergency Department

## 2024-02-01 ENCOUNTER — Other Ambulatory Visit: Payer: Self-pay

## 2024-02-01 ENCOUNTER — Emergency Department
Admission: EM | Admit: 2024-02-01 | Discharge: 2024-02-01 | Disposition: A | Discharge status: Home / Self Care | Attending: Emergency Medicine | Admitting: Emergency Medicine

## 2024-02-01 DIAGNOSIS — N186 End stage renal disease: Secondary | ICD-10-CM | POA: Insufficient documentation

## 2024-02-01 DIAGNOSIS — Z8673 Personal history of transient ischemic attack (TIA), and cerebral infarction without residual deficits: Secondary | ICD-10-CM | POA: Diagnosis not present

## 2024-02-01 DIAGNOSIS — I509 Heart failure, unspecified: Secondary | ICD-10-CM | POA: Insufficient documentation

## 2024-02-01 DIAGNOSIS — J449 Chronic obstructive pulmonary disease, unspecified: Secondary | ICD-10-CM | POA: Insufficient documentation

## 2024-02-01 DIAGNOSIS — N390 Urinary tract infection, site not specified: Secondary | ICD-10-CM | POA: Diagnosis present

## 2024-02-01 DIAGNOSIS — R531 Weakness: Secondary | ICD-10-CM

## 2024-02-01 DIAGNOSIS — I132 Hypertensive heart and chronic kidney disease with heart failure and with stage 5 chronic kidney disease, or end stage renal disease: Secondary | ICD-10-CM | POA: Insufficient documentation

## 2024-02-01 LAB — URINALYSIS, ROUTINE W REFLEX MICROSCOPIC
Bilirubin Urine: NEGATIVE
Glucose, UA: 150 mg/dL — AB
Ketones, ur: NEGATIVE mg/dL
Nitrite: NEGATIVE
Protein, ur: 100 mg/dL — AB
Specific Gravity, Urine: 1.012 (ref 1.005–1.030)
pH: 5 (ref 5.0–8.0)

## 2024-02-01 LAB — COMPREHENSIVE METABOLIC PANEL WITH GFR
ALT: 12 U/L (ref 0–44)
AST: 19 U/L (ref 15–41)
Albumin: 3.5 g/dL (ref 3.5–5.0)
Alkaline Phosphatase: 42 U/L (ref 38–126)
Anion gap: 10 (ref 5–15)
BUN: 46 mg/dL — ABNORMAL HIGH (ref 8–23)
CO2: 27 mmol/L (ref 22–32)
Calcium: 9 mg/dL (ref 8.9–10.3)
Chloride: 108 mmol/L (ref 98–111)
Creatinine, Ser: 2.43 mg/dL — ABNORMAL HIGH (ref 0.61–1.24)
GFR, Estimated: 29 mL/min — ABNORMAL LOW (ref >60)
Glucose, Bld: 100 mg/dL — ABNORMAL HIGH (ref 70–99)
Potassium: 4.5 mmol/L (ref 3.5–5.1)
Sodium: 145 mmol/L (ref 135–145)
Total Bilirubin: 0.3 mg/dL (ref 0.0–1.2)
Total Protein: 8.2 g/dL — ABNORMAL HIGH (ref 6.5–8.1)

## 2024-02-01 LAB — CBG MONITORING, ED: Glucose-Capillary: 99 mg/dL (ref 70–99)

## 2024-02-01 LAB — CBC
HCT: 30.1 % — ABNORMAL LOW (ref 39.0–52.0)
Hemoglobin: 8.8 g/dL — ABNORMAL LOW (ref 13.0–17.0)
MCH: 28.5 pg (ref 26.0–34.0)
MCHC: 29.2 g/dL — ABNORMAL LOW (ref 30.0–36.0)
MCV: 97.4 fL (ref 80.0–100.0)
Platelets: 138 K/uL — ABNORMAL LOW (ref 150–400)
RBC: 3.09 MIL/uL — ABNORMAL LOW (ref 4.22–5.81)
RDW: 12.3 % (ref 11.5–15.5)
WBC: 8 K/uL (ref 4.0–10.5)
nRBC: 0 % (ref 0.0–0.2)

## 2024-02-01 LAB — TROPONIN I (HIGH SENSITIVITY): Troponin I (High Sensitivity): 14 ng/L (ref <18)

## 2024-02-01 LAB — BRAIN NATRIURETIC PEPTIDE: B Natriuretic Peptide: 196.8 pg/mL — ABNORMAL HIGH (ref 0.0–100.0)

## 2024-02-01 MED ORDER — CEPHALEXIN 500 MG PO CAPS: 500.0000 mg | ORAL_CAPSULE | Freq: Three times a day (TID) | ORAL | 0 refills | Status: AC

## 2024-02-01 MED ORDER — CEPHALEXIN 500 MG PO CAPS
500.0000 mg | ORAL_CAPSULE | Freq: Once | ORAL | Status: AC
  Administered 2024-02-01: 500 mg via ORAL
  Filled 2024-02-01: qty 1

## 2024-02-01 NOTE — ED Notes (Signed)
 Called CCMD to add pt to monitoring.

## 2024-02-01 NOTE — ED Provider Notes (Signed)
 [facility] Provider Note    Event Date/Time   First MD Initiated Contact with Patient 02/01/24 1011     (approximate)   History   Weakness   HPI  Darin Hopkins is a 63 y.o. male with history of CHF, COPD, hypertension who presents with increased weakness.  Patient reports lower energy than typical, family reports he has slept 2 hours later than typical which is unusual for him.  Denies fevers or chills.  No pain, no cough has no physical complaints at this time     Physical Exam   Triage Vital Signs: ED Triage Vitals  Encounter Vitals Group     BP 02/01/24 1030 127/78     Girls Systolic BP Percentile --      Girls Diastolic BP Percentile --      Boys Systolic BP Percentile --      Boys Diastolic BP Percentile --      Pulse Rate 02/01/24 1030 (!) 55     Resp 02/01/24 1030 19     Temp 02/01/24 1100 98 F (36.7 C)     Temp Source 02/01/24 1100 Oral     SpO2 02/01/24 1025 92 %     Weight 02/01/24 1030 54.4 kg (120 lb)     Height 02/01/24 1030 1.702 m (5' 7)     Head Circumference --      Peak Flow --      Pain Score 02/01/24 1027 0     Pain Loc --      Pain Education --      Exclude from Growth Chart --     Most recent vital signs: Vitals:   02/01/24 1100 02/01/24 1103  BP:    Pulse:    Resp:    Temp: 98 F (36.7 C)   SpO2: 99% 100%     General: Awake, no distress.  CV:  Good peripheral perfusion.  Regular rate and rhythm Resp:  Normal effort.  Clear to auscultation bilaterally Abd:  No distention.  Soft, nontender, no CVA tenderness Other:     ED Results / Procedures / Treatments   Labs (all labs ordered are listed, but only abnormal results are displayed) Labs Reviewed  COMPREHENSIVE METABOLIC PANEL WITH GFR - Abnormal; Notable for the following components:      Result Value   Glucose, Bld 100 (*)    BUN 46 (*)    Creatinine, Ser 2.43 (*)    Total Protein 8.2 (*)    GFR, Estimated 29 (*)    All other components  within normal limits  CBC - Abnormal; Notable for the following components:   RBC 3.09 (*)    Hemoglobin 8.8 (*)    HCT 30.1 (*)    MCHC 29.2 (*)    Platelets 138 (*)    All other components within normal limits  URINALYSIS, ROUTINE W REFLEX MICROSCOPIC - Abnormal; Notable for the following components:   Color, Urine YELLOW (*)    APPearance CLOUDY (*)    Glucose, UA 150 (*)    Hgb urine dipstick SMALL (*)    Protein, ur 100 (*)    Leukocytes,Ua SMALL (*)    Bacteria, UA MANY (*)    All other components within normal limits  BRAIN NATRIURETIC PEPTIDE - Abnormal; Notable for the following components:   B Natriuretic Peptide 196.8 (*)    All other components within normal limits  CBG MONITORING, ED  TROPONIN I (HIGH SENSITIVITY)  EKG  ED ECG REPORT I, Lamar Price, the attending physician, personally viewed and interpreted this ECG.  Date: 02/01/2024  Rhythm: normal sinus rhythm QRS Axis: normal Intervals: normal ST/T Wave abnormalities: normal Narrative Interpretation: no evidence of acute ischemia    RADIOLOGY Chest x-ray viewed interpret by me, no acute abnormality    PROCEDURES:  Critical Care performed:   Procedures   MEDICATIONS ORDERED IN ED: Medications  cephALEXin  (KEFLEX ) capsule 500 mg (has no administration in time range)     IMPRESSION / MDM / ASSESSMENT AND PLAN / ED COURSE  I reviewed the triage vital signs and the nursing notes. Patient's presentation is most consistent with acute presentation with potential threat to life or bodily function.  Patient presents with generalized weakness as detailed above, differential includes electrolyte imbalance, infection including sepsis, cardiac etiology  Labs pending urinalysis and chest x-ray pending  Lab work is overall reassuring with normal high sensitive troponin, BUN and creatinine are in line with prior levels.  Hemoglobin is in line with prior levels  Chest x-ray without evidence of  pneumonia  Patient does have an evidence of urinary tract infection, this could certainly explain his weakness decreased energy levels, will start him on Keflex  3 times daily, appropriate for discharge, no indication for admission, outpatient follow-up recommended, return precautions discussed, family agrees with this plan.        FINAL CLINICAL IMPRESSION(S) / ED DIAGNOSES   Final diagnoses:  Weakness  Lower urinary tract infectious disease     Rx / DC Orders   ED Discharge Orders          Ordered    cephALEXin  (KEFLEX ) 500 MG capsule  3 times daily        02/01/24 1212             Note:  This document was prepared using Dragon voice recognition software and may include unintentional dictation errors.   Price Lamar, MD 02/01/24 1336

## 2024-02-01 NOTE — ED Triage Notes (Addendum)
 Pt arrives via [facility] from home with c/o weakness and going down hill x2 days per EMS who got the report from sister whom pt lives with. Pt denies recent falls or pain. Pt has a hx of prior stroke and ESRD but does not get dialysis. Pt wears oxygen chronically. Pt is A&Ox4 during triage.   Pt's testicles were found to be enlarged upon assessment.

## 2024-04-03 ENCOUNTER — Inpatient Hospital Stay
Admission: EM | Admit: 2024-04-03 | Discharge: 2024-04-21 | Disposition: A | Discharge status: Skilled Nursing Facility | Source: Home / Self Care | Attending: Internal Medicine | Admitting: Internal Medicine

## 2024-04-03 ENCOUNTER — Other Ambulatory Visit: Payer: Self-pay

## 2024-04-03 ENCOUNTER — Emergency Department

## 2024-04-03 ENCOUNTER — Encounter: Payer: Self-pay | Admitting: Emergency Medicine

## 2024-04-03 ENCOUNTER — Inpatient Hospital Stay

## 2024-04-03 ENCOUNTER — Observation Stay

## 2024-04-03 DIAGNOSIS — K8689 Other specified diseases of pancreas: Secondary | ICD-10-CM | POA: Diagnosis not present

## 2024-04-03 DIAGNOSIS — I6930 Unspecified sequelae of cerebral infarction: Secondary | ICD-10-CM | POA: Diagnosis not present

## 2024-04-03 DIAGNOSIS — C641 Malignant neoplasm of right kidney, except renal pelvis: Secondary | ICD-10-CM | POA: Diagnosis present

## 2024-04-03 DIAGNOSIS — E785 Hyperlipidemia, unspecified: Secondary | ICD-10-CM | POA: Diagnosis present

## 2024-04-03 DIAGNOSIS — J189 Pneumonia, unspecified organism: Secondary | ICD-10-CM

## 2024-04-03 DIAGNOSIS — N189 Chronic kidney disease, unspecified: Secondary | ICD-10-CM | POA: Diagnosis not present

## 2024-04-03 DIAGNOSIS — N179 Acute kidney failure, unspecified: Secondary | ICD-10-CM | POA: Diagnosis present

## 2024-04-03 DIAGNOSIS — E43 Unspecified severe protein-calorie malnutrition: Secondary | ICD-10-CM

## 2024-04-03 DIAGNOSIS — D508 Other iron deficiency anemias: Principal | ICD-10-CM

## 2024-04-03 DIAGNOSIS — K4090 Unilateral inguinal hernia, without obstruction or gangrene, not specified as recurrent: Secondary | ICD-10-CM

## 2024-04-03 DIAGNOSIS — Z7189 Other specified counseling: Secondary | ICD-10-CM | POA: Diagnosis not present

## 2024-04-03 DIAGNOSIS — J441 Chronic obstructive pulmonary disease with (acute) exacerbation: Secondary | ICD-10-CM | POA: Diagnosis present

## 2024-04-03 DIAGNOSIS — I10 Essential (primary) hypertension: Secondary | ICD-10-CM

## 2024-04-03 DIAGNOSIS — J9601 Acute respiratory failure with hypoxia: Secondary | ICD-10-CM

## 2024-04-03 DIAGNOSIS — N2889 Other specified disorders of kidney and ureter: Secondary | ICD-10-CM

## 2024-04-03 DIAGNOSIS — R0689 Other abnormalities of breathing: Principal | ICD-10-CM

## 2024-04-03 DIAGNOSIS — R710 Precipitous drop in hematocrit: Secondary | ICD-10-CM | POA: Diagnosis not present

## 2024-04-03 DIAGNOSIS — E875 Hyperkalemia: Secondary | ICD-10-CM

## 2024-04-03 DIAGNOSIS — Z23 Encounter for immunization: Secondary | ICD-10-CM | POA: Diagnosis not present

## 2024-04-03 DIAGNOSIS — J9612 Chronic respiratory failure with hypercapnia: Secondary | ICD-10-CM | POA: Diagnosis present

## 2024-04-03 DIAGNOSIS — J449 Chronic obstructive pulmonary disease, unspecified: Secondary | ICD-10-CM | POA: Diagnosis present

## 2024-04-03 DIAGNOSIS — Z87898 Personal history of other specified conditions: Secondary | ICD-10-CM

## 2024-04-03 DIAGNOSIS — J440 Chronic obstructive pulmonary disease with acute lower respiratory infection: Secondary | ICD-10-CM | POA: Diagnosis present

## 2024-04-03 DIAGNOSIS — C259 Malignant neoplasm of pancreas, unspecified: Secondary | ICD-10-CM | POA: Diagnosis present

## 2024-04-03 DIAGNOSIS — G40901 Epilepsy, unspecified, not intractable, with status epilepticus: Secondary | ICD-10-CM

## 2024-04-03 DIAGNOSIS — F1010 Alcohol abuse, uncomplicated: Secondary | ICD-10-CM

## 2024-04-03 DIAGNOSIS — R0603 Acute respiratory distress: Secondary | ICD-10-CM | POA: Diagnosis not present

## 2024-04-03 DIAGNOSIS — R531 Weakness: Principal | ICD-10-CM

## 2024-04-03 DIAGNOSIS — I5032 Chronic diastolic (congestive) heart failure: Secondary | ICD-10-CM | POA: Insufficient documentation

## 2024-04-03 DIAGNOSIS — R627 Adult failure to thrive: Secondary | ICD-10-CM | POA: Diagnosis present

## 2024-04-03 DIAGNOSIS — N289 Disorder of kidney and ureter, unspecified: Secondary | ICD-10-CM

## 2024-04-03 DIAGNOSIS — Z515 Encounter for palliative care: Secondary | ICD-10-CM | POA: Diagnosis not present

## 2024-04-03 DIAGNOSIS — N1832 Chronic kidney disease, stage 3b: Secondary | ICD-10-CM | POA: Diagnosis present

## 2024-04-03 DIAGNOSIS — J9602 Acute respiratory failure with hypercapnia: Secondary | ICD-10-CM

## 2024-04-03 DIAGNOSIS — J9611 Chronic respiratory failure with hypoxia: Secondary | ICD-10-CM

## 2024-04-03 DIAGNOSIS — E870 Hyperosmolality and hypernatremia: Secondary | ICD-10-CM | POA: Diagnosis present

## 2024-04-03 DIAGNOSIS — I509 Heart failure, unspecified: Secondary | ICD-10-CM | POA: Diagnosis not present

## 2024-04-03 DIAGNOSIS — Z681 Body mass index (BMI) 19 or less, adult: Secondary | ICD-10-CM | POA: Diagnosis not present

## 2024-04-03 DIAGNOSIS — Z1152 Encounter for screening for COVID-19: Secondary | ICD-10-CM | POA: Diagnosis not present

## 2024-04-03 DIAGNOSIS — E46 Unspecified protein-calorie malnutrition: Secondary | ICD-10-CM | POA: Diagnosis present

## 2024-04-03 DIAGNOSIS — G40909 Epilepsy, unspecified, not intractable, without status epilepticus: Secondary | ICD-10-CM | POA: Diagnosis present

## 2024-04-03 DIAGNOSIS — D631 Anemia in chronic kidney disease: Secondary | ICD-10-CM | POA: Diagnosis present

## 2024-04-03 DIAGNOSIS — L89152 Pressure ulcer of sacral region, stage 2: Secondary | ICD-10-CM | POA: Diagnosis not present

## 2024-04-03 DIAGNOSIS — I130 Hypertensive heart and chronic kidney disease with heart failure and stage 1 through stage 4 chronic kidney disease, or unspecified chronic kidney disease: Secondary | ICD-10-CM | POA: Diagnosis present

## 2024-04-03 LAB — URINALYSIS, ROUTINE W REFLEX MICROSCOPIC
Bilirubin Urine: NEGATIVE
Glucose, UA: 50 mg/dL — AB
Hgb urine dipstick: NEGATIVE
Ketones, ur: NEGATIVE mg/dL
Leukocytes,Ua: NEGATIVE
Nitrite: NEGATIVE
Protein, ur: 100 mg/dL — AB
Specific Gravity, Urine: 1.013 (ref 1.005–1.030)
pH: 5 (ref 5.0–8.0)

## 2024-04-03 LAB — URINE DRUG SCREEN
Amphetamines: NEGATIVE
Barbiturates: NEGATIVE
Benzodiazepines: NEGATIVE
Cocaine: NEGATIVE
Fentanyl: NEGATIVE
Methadone Scn, Ur: NEGATIVE
Opiates: NEGATIVE
Tetrahydrocannabinol: NEGATIVE

## 2024-04-03 LAB — PRO BRAIN NATRIURETIC PEPTIDE: Pro Brain Natriuretic Peptide: 3058 pg/mL — ABNORMAL HIGH (ref <300.0)

## 2024-04-03 LAB — CBC WITH DIFFERENTIAL/PLATELET
Abs Immature Granulocytes: 0.05 K/uL (ref 0.00–0.07)
Basophils Absolute: 0 K/uL (ref 0.0–0.1)
Basophils Relative: 0 %
Eosinophils Absolute: 0.2 K/uL (ref 0.0–0.5)
Eosinophils Relative: 1 %
HCT: 28 % — ABNORMAL LOW (ref 39.0–52.0)
Hemoglobin: 7.9 g/dL — ABNORMAL LOW (ref 13.0–17.0)
Immature Granulocytes: 0 %
Lymphocytes Relative: 40 %
Lymphs Abs: 4.7 K/uL — ABNORMAL HIGH (ref 0.7–4.0)
MCH: 27.4 pg (ref 26.0–34.0)
MCHC: 28.2 g/dL — ABNORMAL LOW (ref 30.0–36.0)
MCV: 97.2 fL (ref 80.0–100.0)
Monocytes Absolute: 0.5 K/uL (ref 0.1–1.0)
Monocytes Relative: 4 %
Neutro Abs: 6.3 K/uL (ref 1.7–7.7)
Neutrophils Relative %: 55 %
Platelets: 180 K/uL (ref 150–400)
RBC: 2.88 MIL/uL — ABNORMAL LOW (ref 4.22–5.81)
RDW: 12.4 % (ref 11.5–15.5)
WBC: 11.7 K/uL — ABNORMAL HIGH (ref 4.0–10.5)
nRBC: 0 % (ref 0.0–0.2)

## 2024-04-03 LAB — BLOOD GAS, VENOUS
Acid-Base Excess: 1 mmol/L (ref 0.0–2.0)
Bicarbonate: 31.2 mmol/L — ABNORMAL HIGH (ref 20.0–28.0)
O2 Saturation: 75.6 %
Patient temperature: 37
pCO2, Ven: 78 mmHg (ref 44–60)
pH, Ven: 7.21 — ABNORMAL LOW (ref 7.25–7.43)
pO2, Ven: 46 mmHg — ABNORMAL HIGH (ref 32–45)

## 2024-04-03 LAB — COMPREHENSIVE METABOLIC PANEL WITH GFR
ALT: 7 U/L (ref 0–44)
AST: 17 U/L (ref 15–41)
Albumin: 3.9 g/dL (ref 3.5–5.0)
Alkaline Phosphatase: 62 U/L (ref 38–126)
Anion gap: 11 (ref 5–15)
BUN: 47 mg/dL — ABNORMAL HIGH (ref 8–23)
CO2: 27 mmol/L (ref 22–32)
Calcium: 8.7 mg/dL — ABNORMAL LOW (ref 8.9–10.3)
Chloride: 107 mmol/L (ref 98–111)
Creatinine, Ser: 2.77 mg/dL — ABNORMAL HIGH (ref 0.61–1.24)
GFR, Estimated: 25 mL/min — ABNORMAL LOW (ref >60)
Glucose, Bld: 135 mg/dL — ABNORMAL HIGH (ref 70–99)
Potassium: 5.2 mmol/L — ABNORMAL HIGH (ref 3.5–5.1)
Sodium: 145 mmol/L (ref 135–145)
Total Bilirubin: 0.4 mg/dL (ref 0.0–1.2)
Total Protein: 9.3 g/dL — ABNORMAL HIGH (ref 6.5–8.1)

## 2024-04-03 LAB — CK: Total CK: 76 U/L (ref 49–397)

## 2024-04-03 LAB — RESP PANEL BY RT-PCR (RSV, FLU A&B, COVID)  RVPGX2
Influenza A by PCR: NEGATIVE
Influenza B by PCR: NEGATIVE
Resp Syncytial Virus by PCR: NEGATIVE
SARS Coronavirus 2 by RT PCR: NEGATIVE

## 2024-04-03 LAB — ETHANOL: Alcohol, Ethyl (B): <15 mg/dL (ref <15)

## 2024-04-03 LAB — TSH: TSH: 0.843 u[IU]/mL (ref 0.350–4.500)

## 2024-04-03 LAB — LACTIC ACID, PLASMA: Lactic Acid, Venous: 0.6 mmol/L (ref 0.5–1.9)

## 2024-04-03 LAB — T4, FREE: Free T4: 1.12 ng/dL (ref 0.61–1.12)

## 2024-04-03 LAB — LIPASE, BLOOD: Lipase: 166 U/L — ABNORMAL HIGH (ref 11–51)

## 2024-04-03 LAB — PROTIME-INR
INR: 1.1 (ref 0.8–1.2)
Prothrombin Time: 14.6 s (ref 11.4–15.2)

## 2024-04-03 LAB — TROPONIN T, HIGH SENSITIVITY
Troponin T High Sensitivity: 68 ng/L — ABNORMAL HIGH (ref 0–19)
Troponin T High Sensitivity: 69 ng/L — ABNORMAL HIGH (ref 0–19)

## 2024-04-03 LAB — SALICYLATE LEVEL: Salicylate Lvl: <7 mg/dL — ABNORMAL LOW (ref 7.0–30.0)

## 2024-04-03 LAB — APTT: aPTT: 28 s (ref 24–36)

## 2024-04-03 LAB — ACETAMINOPHEN LEVEL: Acetaminophen (Tylenol), Serum: <10 ug/mL — ABNORMAL LOW (ref 10–30)

## 2024-04-03 MED ORDER — ONDANSETRON HCL 4 MG PO TABS
4.0000 mg | ORAL_TABLET | Freq: Four times a day (QID) | ORAL | Status: DC | PRN
  Filled 2024-04-03: qty 1

## 2024-04-03 MED ORDER — IPRATROPIUM-ALBUTEROL 0.5-2.5 (3) MG/3ML IN SOLN: 3.0000 mL | Freq: Four times a day (QID) | RESPIRATORY_TRACT | Status: DC | PRN

## 2024-04-03 MED ORDER — SODIUM CHLORIDE 0.9 % IV SOLN
100.0000 mg | Freq: Once | INTRAVENOUS | Status: AC
  Administered 2024-04-03: 100 mg via INTRAVENOUS
  Filled 2024-04-03: qty 100

## 2024-04-03 MED ORDER — GUAIFENESIN ER 600 MG PO TB12
600.0000 mg | ORAL_TABLET | Freq: Two times a day (BID) | ORAL | Status: DC
  Administered 2024-04-03 – 2024-04-21 (×37): 600 mg via ORAL
  Filled 2024-04-03 (×38): qty 1

## 2024-04-03 MED ORDER — IPRATROPIUM-ALBUTEROL 0.5-2.5 (3) MG/3ML IN SOLN
6.0000 mL | Freq: Once | RESPIRATORY_TRACT | Status: AC
  Administered 2024-04-03: 6 mL via RESPIRATORY_TRACT
  Filled 2024-04-03: qty 6

## 2024-04-03 MED ORDER — PREDNISONE 20 MG PO TABS: 40.0000 mg | ORAL_TABLET | Freq: Every day | ORAL | Status: DC

## 2024-04-03 MED ORDER — HYDROCODONE-ACETAMINOPHEN 5-325 MG PO TABS
1.0000 | ORAL_TABLET | ORAL | Status: DC | PRN
  Administered 2024-04-07 (×3): 1 via ORAL
  Administered 2024-04-08: 2 via ORAL
  Filled 2024-04-03: qty 1
  Filled 2024-04-03: qty 2
  Filled 2024-04-03 (×2): qty 1
  Filled 2024-04-03: qty 2

## 2024-04-03 MED ORDER — UMECLIDINIUM BROMIDE 62.5 MCG/ACT IN AEPB
1.0000 | INHALATION_SPRAY | Freq: Every day | RESPIRATORY_TRACT | Status: DC
  Administered 2024-04-03 – 2024-04-21 (×19): 1 via RESPIRATORY_TRACT
  Filled 2024-04-03 (×5): qty 7

## 2024-04-03 MED ORDER — METHYLPREDNISOLONE SODIUM SUCC 40 MG IJ SOLR: 40.0000 mg | Freq: Two times a day (BID) | INTRAMUSCULAR | Status: DC

## 2024-04-03 MED ORDER — SODIUM CHLORIDE 0.9 % IV SOLN
1.0000 g | INTRAVENOUS | Status: DC
  Administered 2024-04-04: 1 g via INTRAVENOUS
  Filled 2024-04-03: qty 10

## 2024-04-03 MED ORDER — ENOXAPARIN SODIUM 30 MG/0.3ML IJ SOSY
30.0000 mg | PREFILLED_SYRINGE | INTRAMUSCULAR | Status: DC
  Administered 2024-04-03 – 2024-04-07 (×5): 30 mg via SUBCUTANEOUS
  Filled 2024-04-03 (×5): qty 0.3

## 2024-04-03 MED ORDER — IPRATROPIUM-ALBUTEROL 0.5-2.5 (3) MG/3ML IN SOLN
3.0000 mL | Freq: Four times a day (QID) | RESPIRATORY_TRACT | Status: DC
  Administered 2024-04-03 (×2): 3 mL via RESPIRATORY_TRACT
  Filled 2024-04-03 (×2): qty 3

## 2024-04-03 MED ORDER — LEVETIRACETAM 750 MG PO TABS
750.0000 mg | ORAL_TABLET | Freq: Two times a day (BID) | ORAL | Status: DC
  Administered 2024-04-03 – 2024-04-21 (×37): 750 mg via ORAL
  Filled 2024-04-03 (×39): qty 1

## 2024-04-03 MED ORDER — ACETAMINOPHEN 325 MG PO TABS
650.0000 mg | ORAL_TABLET | Freq: Four times a day (QID) | ORAL | Status: DC | PRN
  Administered 2024-04-06 – 2024-04-09 (×2): 650 mg via ORAL
  Filled 2024-04-03 (×2): qty 2

## 2024-04-03 MED ORDER — ACETAMINOPHEN 650 MG RE SUPP: 650.0000 mg | Freq: Four times a day (QID) | RECTAL | Status: DC | PRN

## 2024-04-03 MED ORDER — SODIUM CHLORIDE 0.9 % IV SOLN
2.0000 g | Freq: Once | INTRAVENOUS | Status: AC
  Administered 2024-04-03: 2 g via INTRAVENOUS
  Filled 2024-04-03: qty 20

## 2024-04-03 MED ORDER — ALBUTEROL SULFATE (2.5 MG/3ML) 0.083% IN NEBU: 2.5000 mg | INHALATION_SOLUTION | RESPIRATORY_TRACT | Status: DC | PRN

## 2024-04-03 MED ORDER — METHYLPREDNISOLONE SODIUM SUCC 125 MG IJ SOLR
125.0000 mg | Freq: Once | INTRAMUSCULAR | Status: AC
  Administered 2024-04-03: 125 mg via INTRAVENOUS
  Filled 2024-04-03: qty 2

## 2024-04-03 MED ORDER — ONDANSETRON HCL 4 MG/2ML IJ SOLN
4.0000 mg | Freq: Four times a day (QID) | INTRAMUSCULAR | Status: DC | PRN
  Administered 2024-04-07: 4 mg via INTRAVENOUS
  Filled 2024-04-03: qty 2

## 2024-04-03 MED ORDER — ARFORMOTEROL TARTRATE 15 MCG/2ML IN NEBU
15.0000 ug | INHALATION_SOLUTION | Freq: Two times a day (BID) | RESPIRATORY_TRACT | Status: DC
  Administered 2024-04-03: 15 ug via RESPIRATORY_TRACT
  Filled 2024-04-03 (×2): qty 2

## 2024-04-03 NOTE — ED Provider Notes (Addendum)
 [facility] Provider Note    Event Date/Time   First MD Initiated Contact with Patient 04/03/24 [phone]     (approximate)   History   Weakness   HPI  Darin Hopkins is a 63 y.o. male with COPD on 2 L who comes in with concerns for weakness.  Patient's last known well was Sunday at 730 morning.  He has a history of prior stroke with left-sided deficits.  Family reported that he seemed more weak.  Patient denies any falls chest pain, shortness of breath.  He is unsure why he is here.  When EMS got there he was satting in the 50s and his 2 L of oxygen tank was empty.  There was report of a lot of bedbugs.  Physical Exam   Triage Vital Signs: ED Triage Vitals  Encounter Vitals Group     BP 04/03/24 0339 108/75     Girls Systolic BP Percentile --      Girls Diastolic BP Percentile --      Boys Systolic BP Percentile --      Boys Diastolic BP Percentile --      Pulse Rate 04/03/24 0339 79     Resp 04/03/24 0339 17     Temp 04/03/24 0339 98.3 F (36.8 C)     Temp Source 04/03/24 0339 Oral     SpO2 04/03/24 0339 97 %     Weight 04/03/24 0341 114 lb (51.7 kg)     Height 04/03/24 0341 5' 7 (1.702 m)     Head Circumference --      Peak Flow --      Pain Score 04/03/24 0340 0     Pain Loc --      Pain Education --      Exclude from Growth Chart --     Most recent vital signs: Vitals:   04/03/24 0500 04/03/24 0530  BP: 105/71 125/73  Pulse: 60 64  Resp: 14 17  Temp:    SpO2: 100% 100%     General: Awake, no distress.  CV:  Good peripheral perfusion.  Resp:    Tight air exchange, no obvious wheezing Abd:  No distention.  Soft and nontender Other:  Large hernia noted in scrotum.  Patient reports this is baseline he denies any new pain, redness. Patient does not appear to have any obvious cranial nerve deficits.  Moving all extremities well  ED Results / Procedures / Treatments   Labs (all labs ordered are listed, but only abnormal results are  displayed) Labs Reviewed  CBC WITH DIFFERENTIAL/PLATELET - Abnormal; Notable for the following components:      Result Value   WBC 11.7 (*)    RBC 2.88 (*)    Hemoglobin 7.9 (*)    HCT 28.0 (*)    MCHC 28.2 (*)    Lymphs Abs 4.7 (*)    All other components within normal limits  BLOOD GAS, VENOUS - Abnormal; Notable for the following components:   pH, Ven 7.21 (*)    pCO2, Ven 78 (*)    pO2, Ven 46 (*)    Bicarbonate 31.2 (*)    All other components within normal limits  SALICYLATE LEVEL - Abnormal; Notable for the following components:   Salicylate Lvl <7.0 (*)    All other components within normal limits  ACETAMINOPHEN  LEVEL - Abnormal; Notable for the following components:   Acetaminophen  (Tylenol ), Serum <10 (*)    All other components within normal  limits  COMPREHENSIVE METABOLIC PANEL WITH GFR - Abnormal; Notable for the following components:   Potassium 5.2 (*)    Glucose, Bld 135 (*)    BUN 47 (*)    Creatinine, Ser 2.77 (*)    Calcium  8.7 (*)    Total Protein 9.3 (*)    GFR, Estimated 25 (*)    All other components within normal limits  PRO BRAIN NATRIURETIC PEPTIDE - Abnormal; Notable for the following components:   Pro Brain Natriuretic Peptide 3,058.0 (*)    All other components within normal limits  TROPONIN T, HIGH SENSITIVITY - Abnormal; Notable for the following components:   Troponin T High Sensitivity 68 (*)    All other components within normal limits  RESP PANEL BY RT-PCR (RSV, FLU A&B, COVID)  RVPGX2  PROTIME-INR  APTT  TSH  T4, FREE  CK  URINALYSIS, ROUTINE W REFLEX MICROSCOPIC  URINE DRUG SCREEN  ETHANOL  HIV ANTIBODY (ROUTINE TESTING W REFLEX)  TROPONIN T, HIGH SENSITIVITY     EKG  My interpretation of EKG:  Normal sinus rhythm 81 without any ST elevation or T wave inversions, normal intervals  RADIOLOGY I have reviewed the ct  personally and interpreted no evidence of intracranial hemorrhage   PROCEDURES:  Critical Care  performed: Yes, see critical care procedure note(s)  .1-3 Lead EKG Interpretation  Performed by: Ernest Ronal BRAVO, MD Authorized by: Ernest Ronal BRAVO, MD     Interpretation: normal     ECG rate:  60   ECG rate assessment: normal     Rhythm: sinus rhythm     Ectopy: none     Conduction: normal   .Critical Care  Performed by: Ernest Ronal BRAVO, MD Authorized by: Ernest Ronal BRAVO, MD   Critical care provider statement:    Critical care time (minutes):  30   Critical care was necessary to treat or prevent imminent or life-threatening deterioration of the following conditions:  Respiratory failure   Critical care was time spent personally by me on the following activities:  Development of treatment plan with patient or surrogate, discussions with consultants, evaluation of patient's response to treatment, examination of patient, ordering and review of laboratory studies, ordering and review of radiographic studies, ordering and performing treatments and interventions, pulse oximetry, re-evaluation of patient's condition and review of old charts    MEDICATIONS ORDERED IN ED: Medications  doxycycline  (VIBRAMYCIN ) 100 mg in sodium chloride  0.9 % 250 mL IVPB (100 mg Intravenous New Bag/Given 04/03/24 0615)  enoxaparin  (LOVENOX ) injection 30 mg (has no administration in time range)  acetaminophen  (TYLENOL ) tablet 650 mg (has no administration in time range)    Or  acetaminophen  (TYLENOL ) suppository 650 mg (has no administration in time range)  ondansetron  (ZOFRAN ) tablet 4 mg (has no administration in time range)    Or  ondansetron  (ZOFRAN ) injection 4 mg (has no administration in time range)  ipratropium-albuterol  (DUONEB) 0.5-2.5 (3) MG/3ML nebulizer solution 3 mL (has no administration in time range)  albuterol  (PROVENTIL ) (2.5 MG/3ML) 0.083% nebulizer solution 2.5 mg (has no administration in time range)  HYDROcodone -acetaminophen  (NORCO/VICODIN) 5-325 MG per tablet 1-2 tablet (has no administration  in time range)  guaiFENesin  (MUCINEX ) 12 hr tablet 600 mg (has no administration in time range)  methylPREDNISolone  sodium succinate (SOLU-MEDROL ) 40 mg/mL injection 40 mg (has no administration in time range)    Followed by  predniSONE  (DELTASONE ) tablet 40 mg (has no administration in time range)  ipratropium-albuterol  (DUONEB) 0.5-2.5 (3) MG/3ML nebulizer solution  6 mL (6 mLs Nebulization Given 04/03/24 0436)  methylPREDNISolone  sodium succinate (SOLU-MEDROL ) 125 mg/2 mL injection 125 mg (125 mg Intravenous Given 04/03/24 0436)  cefTRIAXone  (ROCEPHIN ) 2 g in sodium chloride  0.9 % 100 mL IVPB (0 g Intravenous Stopped 04/03/24 0600)     IMPRESSION / MDM / ASSESSMENT AND PLAN / ED COURSE  I reviewed the triage vital signs and the nursing notes.   Patient's presentation is most consistent with acute presentation with potential threat to life or bodily function.   Patient comes in with increasing work weakness.  Patient not the best historian.  Overall nonfocal examination.  Does have tight air exchange so placed on 2 L and given treatment for COPD exacerbation.  He is not hypoxic on his baseline oxygen.  CT imaging ordered to evaluate for acute pathology given he is a poor historian.  CT of the head and neck were negative for acute pathology But given he also has this large hernia noted on examination I did get a CT abdomen pelvis and CT chest to ensure no evidence of any pneumonia.  He denies any new pain and reports that this hernia has been there for years.  Troponin slightly elevated.  COVID and flu were negative.  CMP shows slightly elevated creatinine little bit worsened from prior.  Slightly elevated potassium.  Hemoglobin is slightly downtrending.  White count was slightly elevated.  CT of the head and neck were negative for acute pathology.  Does have a lot of chronic findings that were noted.  CT imaging concerning for possible pneumonia.  Patient be started on antibiotics.  Patient  does not meet sepsis criteria so holding on blood cultures,  Patient does have a very large left groin hernia.  On examination he denies any signs or symptoms to suggest incarceration. Added on lactic to ensure not super high to suggest bowel ischemia but again on exam pt denies any new symptoms. Did alert nurse to draw this since I'm leaving and to let hospitalist know if elevated and did message zach from surgery to make sure they done see anything concerning on CT. There is also a lot of other incidental findings with possible renal cell carcinoma, pancreatitc dilation.  Patient was alerted that he has additional incidental findings that will need to be worked up in the hospital as well as follow-up with his primary care doctor.  I will add on lipase given the pancreatic dilation.   The patient is on the cardiac monitor to evaluate for evidence of arrhythmia and/or significant heart rate changes.      FINAL CLINICAL IMPRESSION(S) / ED DIAGNOSES   Final diagnoses:  Hypercapnia  Respiratory distress  Chronic obstructive pulmonary disease, unspecified COPD type (HCC)  Weakness     Rx / DC Orders   ED Discharge Orders     None        Note:  This document was prepared using Dragon voice recognition software and may include unintentional dictation errors.   Ernest Ronal BRAVO, MD 04/03/24 0730    Ernest Ronal BRAVO, MD 04/03/24 [phone]

## 2024-04-03 NOTE — ED Triage Notes (Signed)
 Increased weakness since 730a Sunday morning. History of stroke with left sided deficits.

## 2024-04-03 NOTE — Consult Note (Addendum)
 Consultation Note Date: 04/03/2024 at 1145  Patient Name: Darin Hopkins  DOB: 1961/03/16  MRN: [id number]  Age / Sex: 63 y.o., male  PCP: Darin Garre, NP Referring Physician: Lenon Marien LITTIE, MD  HPI/Patient Profile: 63 y.o. male  with past medical history of COPD, HTN, left hernia, GERD, CHF, history of seizures, HLD, alcohol dependence, CKD stage III, and protein calorie malnutrition admitted on 04/03/2024 with weakness.  As per chart review, EMS reported that initial evaluation found patient's oxygen saturations to be in the 50s.  He is chronically on 2-3 L of nasal cannula but his oxygen tank was found empty.  Additionally, EMS reported significant bedbugs present in his home.  Patient was pan CT scan in the ED with several findings which included:  -Possible left lower lobe pneumonia on chest CT.   -Large left groin hernia without obstruction or incarceration. -Abnormal findings in the pancreas on CT scan were concerning for mass but not fully evaluated so radiology recommended abdominal MRI to further evaluate. -Also has mass on lower pole of right kidney which is consistent with renal cell carcinoma and can also be further evaluated with the MRI. -Additionally, multiple nodules were seen in the right lower lobe of his lungs which is recommended to have noncontrast chest CT follow-up in 3 to 6 months  She is being treated for left lower lobe pneumonia with Mucinex , DuoNeb, Solu-Medrol , ceftriaxone , and doxycycline .  Additionally, general surgery was consulted and no urgent surgical need at this time.  Recommendation is for patient to follow-up outpatient if he wishes to pursue aggressive treatment of inguinal hernia.  Given mass on pancreas and right kidney, MRI was ordered -pending.  PMT was consulted to support patient and family with discussions on boundaries and goals of medical  treatment.   Clinical Assessment and Goals of Care: Extensive chart review completed prior to meeting patient including labs, vital signs, imaging, progress notes, orders, and available advanced directive documents from current and previous encounters. I then met with patient at bedside to discuss diagnosis prognosis, GOC, EOL wishes, disposition and options.  I introduced Palliative Medicine as specialized medical care for people living with serious illness. It focuses on providing relief from the symptoms and stress of a serious illness. The goal is to improve quality of life for both the patient and the family.  We discussed a brief life review of the patient.  He shares he has been disabled for several years now.  He was married but is now divorced.  He endorses he has a deceased daughter and a son that lives in [place].  He currently lives with his sister and brother-in-law.  When asked about diet and mobility at home, patient said, it is fine.  When asked to further elaborate, he continued to follow with just fine.  When asked how he gets his meals, he shares that his family provides them for him.  When asked if he eats 3 meals a day, snacks throughout the day, has no appetite, he again just  responds with is fine.  I attempted to gauge patient's understanding of his current medical situation.  He shares he has been told he has had additional stroke but that he feels well.  He is asking when he will be able to leave the hospital.  Extensive discussion of CT scan results and potential of malignancy reviewed with patient.  I specifically highlighted that abnormal findings were seen on his pancreas as well as kidney.  Discussed the difference between a CT scan and MRI.  Reviewed that MRI has been ordered and is pending at this time.    After medical update was given, he asked what can we do about that.  Reviewed that further testing such as MRI as well as outpatient oncology follow-up  will be needed if workup is desired.  He again asked if he had had a stroke and when he can go home.    Despite efforts to redirect patient, I question his ability to fully understand his medical situation.  I do not believe he can engage fully in goals of care discussions today.  Reviewed advance directives with patient.  In the event that he is unable to speak for himself, he has named his sister and brother-in-law as best boy.  He was in agreement for me to speak with them.  I attempted to speak with patient's sister over the phone.  No answer and I was unable to leave a voicemail.  PMT will continue to follow and support.  Plan to follow-up with patient again tomorrow and continue to attempt to speak with his sister.  No change to plan of care at this time.  Primary Decision Maker NEXT OF KIN  Physical Exam Vitals reviewed.  Constitutional:      Comments: Thin, frail  HENT:     Head:     Comments: Temporal wasting    Mouth/Throat:     Mouth: Mucous membranes are moist.  Eyes:     Pupils: Pupils are equal, round, and reactive to light.  Pulmonary:     Effort: Pulmonary effort is normal.  Abdominal:     Palpations: Abdomen is soft.  Musculoskeletal:     Comments: Excoriation and redness noted around base of neck and wrists  Skin:    General: Skin is warm and dry.  Neurological:     Mental Status: He is alert.  Psychiatric:        Mood and Affect: Mood normal.        Behavior: Behavior normal.     Palliative Assessment/Data: 40%     Thank you for this consult. Palliative medicine will continue to follow and assist holistically.   55 minute visit includes: Detailed review of medical records (labs, imaging, vital signs), medically appropriate exam (mental status, respiratory, cardiac, skin), discussed with treatment team, counseling and educating patient, family and staff, documenting clinical information, medication management and coordination of  care.  Signed by: Lamarr Gunner, DNP, FNP-BC Palliative Medicine   Please contact Palliative Medicine Team providers via [facility] for questions and concerns.

## 2024-04-03 NOTE — ED Notes (Signed)
 This RN returned pt's sister's phone call with no answer.

## 2024-04-03 NOTE — H&P (Signed)
 History and Physical  Darin Hopkins FMW:[id number] DOB: 1960-06-26 DOA: 04/03/2024 PCP: [name], NP  Chief Complaint: weakness Historian: patient  HPI:  Darin Hopkins is a 63 y.o. male with a PMH significant for COPD, HTN, left inguinal hernia, GERD, CHF, history of seizure, HLD, alcohol dependence, protein calorie malnutrition, CKD 3.  They presented from home to the ED on 04/03/2024 with concerns for considerable weakness x 2 days.  Per EMS report, patient's O2 saturations on their initial evaluation were in the 50s which appears to be due to the fact that his oxygen tank was empty and chronically is supposed to be on 2 to 3 L.  They also reported significant bedbugs in his home.  He was put on home oxygen with significant improvement.  In the ED, it was found that they had stable vital signs on 2 L nasal cannula.  It appears that patient was put on BiPAP at some point earlier this morning but there is no documented desaturation or reason for this and when I evaluated patient he was stable and comfortable so transitioned back to his home nasal cannula oxygen.  Significant findings included: Essentially pan CT scanned in the ED with several findings:   -Possible left lower lobe pneumonia on chest CT.   -Large left groin hernia without obstruction or incarceration. -Abnormal findings in the pancreas on CT scan were concerning for mass but not fully evaluated so radiology recommended abdominal MRI to further evaluate. -Also has mass on lower pole of right kidney which is consistent with renal cell carcinoma and can also be further evaluated with the MRI. -Additionally, multiple nodules were seen in the right lower lobe of his lungs which is recommended to have noncontrast chest CT follow-up in 3 to 6 months.  They were initially treated with Mucinex , DuoNeb, Solu-Medrol , ceftriaxone , doxycycline .   Patient was admitted to medicine service for further workup and management of hypoxia  as outlined in detail below.  Assessment/Plan Principal Problem:   COPD exacerbation (HCC)   Left lower lobe pneumonia as seen on chest CT  hypoxia- due to his home oxygen running out. He is now currently at 100% SpO2 on 2L nasal cannula which is his home oxygen requirement.  No wheezing on exam, no productive cough or rhinorrhea reported. - Stopping steroid course - Continue antibiotic treatment for CAP - incentive spirometer  - PT/OT  Mild hyperkalemia-monitor - BMP am - stop his home potassium supplement  Inguinal hernia-evaluated by general surgery and is not urgently surgical.  Patient denies any current discomfort. No incarceration nor obstruction on CT.  He can follow-up outpatient with surgery if he and family decide to pursue this - appreciate surgery recs - monitor bowel movements   Mass of pancreas, right kidney (consistent with RCC)-suspicious of malignancy and seen incidentally on abdominal CT scan.  Radiology recommendations include follow-up MRI to further characterize. - Ordering abdominal MRI and based on those results can decide on additional consults of oncology, urology.  Given his current poor chronic health condition and likely malignant outcome, I am consulting palliative to help devise goals of care strategy with patient and family - holding aspirin  if there is chance of bx   COPD-  - continue PRN breathing treatments  HTN- hold home meds since he has lower-normal BPs  Seizure disorder - continue home keppra   Past Medical History:  Diagnosis Date   CHF (congestive heart failure) (HCC)    COPD (chronic obstructive pulmonary disease) (HCC)  Hypertension    Seizures (HCC)     History reviewed. No pertinent surgical history.   reports that he quit smoking about 4 years ago. His smoking use included cigarettes. He has never used smokeless tobacco. He reports current alcohol use of about 3.0 standard drinks of alcohol per week. He reports that he does  not currently use drugs.  No Known Allergies  History reviewed. No pertinent family history.  Prior to Admission medications   Medication Sig Start Date End Date Taking? Authorizing Provider  amLODipine  (NORVASC ) 10 MG tablet Take 10 mg by mouth every evening. 07/28/20  Yes [provider]  aspirin  81 MG EC tablet Take 81 mg by mouth in the morning.   Yes [provider]  atorvastatin  (LIPITOR) 20 MG tablet Take 20 mg by mouth every evening. 07/28/20  Yes [provider]  bisoprolol  (ZEBETA ) 5 MG tablet One twice daily 01/16/21  Yes Wert, Michael B, MD  cholecalciferol (VITAMIN D3) 25 MCG (1000 UNIT) tablet Take 1,000 Units by mouth daily. 02/29/24  Yes [provider]  FARXIGA  10 MG TABS tablet Take 1 tablet (10 mg total) by mouth daily before breakfast. 09/20/23  Yes Donette Ellouise LABOR, FNP  FEROSUL 325 (65 Fe) MG tablet Take 325 mg by mouth daily with breakfast. 02/29/24  Yes [provider]  folic acid  (FOLVITE ) 1 MG tablet Take 1 mg by mouth daily. 04/17/20  Yes [provider]  furosemide  (LASIX ) 20 MG tablet Take 20 mg by mouth daily. 02/29/24  Yes [provider]  levETIRAcetam  (KEPPRA ) 750 MG tablet Take 1 tablet (750 mg total) by mouth 2 (two) times daily. 09/12/20  Yes Pokhrel, Laxman, MD  Multiple Vitamin (MULTIVITAMIN ADULT) TABS Take 2 tablets by mouth daily.   Yes [provider]  omeprazole (PRILOSEC) 20 MG capsule Take 20 mg by mouth daily before breakfast. 07/28/20  Yes [provider]  potassium chloride  (KLOR-CON ) 10 MEQ tablet Take 10 mEq by mouth daily. 11/17/20  Yes [provider]  PROAIR  HFA 108 (90 Base) MCG/ACT inhaler Inhale 2 puffs into the lungs every 6 (six) hours as needed for shortness of breath or wheezing. 07/28/20  Yes [provider]  thiamine  (VITAMIN B1) 100 MG tablet Take 100 mg by mouth daily. 02/29/24  Yes [provider]  Tiotropium Bromide -Olodaterol  (STIOLTO RESPIMAT ) 2.5-2.5 MCG/ACT AERS Inhale 2 puffs into the lungs daily. Pt needs to make an appointment to continue to receive refills please. 01/05/22  Yes [name], MD  traZODone  (DESYREL ) 50 MG tablet Take 1 tablet (50 mg total) by mouth at bedtime. 03/05/21  Yes Hackney, Ellouise A, FNP  hydrALAZINE  (APRESOLINE ) 50 MG tablet Take 0.5 tablets (25 mg total) by mouth 3 (three) times daily. Patient not taking: Reported on 04/03/2024 03/05/21   Donette Ellouise LABOR, FNP  thiamine  100 MG tablet Take 100 mg by mouth daily. Patient not taking: Reported on 04/03/2024 11/17/20   [provider]   I have personally, briefly reviewed patient's prior medical records in [facility]  Objective: Blood pressure 125/73, pulse 64, temperature 98.3 F (36.8 C), temperature source Oral, resp. rate 17, height 5' 7 (1.702 m), weight 51.7 kg, SpO2 100%.   Constitutional: NAD, calm, comfortable. thin HEENT: dry mucous membranes Neck: normal, supple, no masses, no thyromegaly Respiratory: CTAB, no wheezing, no crackles. Normal respiratory effort. No accessory muscle use.  Cardiovascular: RRR, no murmurs / rubs / gallops. No extremity edema. 2+ pedal pulses. no  clubbing / cyanosis.  Abdomen: soft, NT, ND, no masses or HSM palpated. Musculoskeletal: No joint deformity upper and lower extremities. Normal muscle tone.  Skin: dry, intact, normal color, normal temperature on exposed skin Neurologic: Alert and oriented x 3. Normal speech. Grossly non-focal exam. PERRL Psychiatric: Normal mood. Congruent affect.  Labs on Admission: I have personally reviewed admission labs and imaging studies  CBC    Component Value Date/Time   WBC 11.7 (H) 04/03/2024 0358   RBC 2.88 (L) 04/03/2024 0358   HGB 7.9 (L) 04/03/2024 0358   HGB 9.9 (L) 07/07/2023 1112   HCT 28.0 (L) 04/03/2024 0358   HCT 30.8 (L) 07/07/2023 1112   PLT 180 04/03/2024 0358   PLT 152 07/07/2023 1112   MCV 97.2 04/03/2024 0358   MCV 93  07/07/2023 1112   MCV 95 11/02/2013 0511   MCH 27.4 04/03/2024 0358   MCHC 28.2 (L) 04/03/2024 0358   RDW 12.4 04/03/2024 0358   RDW 11.1 (L) 07/07/2023 1112   RDW 12.9 11/02/2013 0511   LYMPHSABS 4.7 (H) 04/03/2024 0358   LYMPHSABS 0.6 (L) 11/02/2013 0511   MONOABS 0.5 04/03/2024 0358   MONOABS 0.1 (L) 11/02/2013 0511   EOSABS 0.2 04/03/2024 0358   EOSABS 0.0 11/02/2013 0511   BASOSABS 0.0 04/03/2024 0358   BASOSABS 0.0 11/02/2013 0511   CMP     Component Value Date/Time   NA 145 04/03/2024 0359   NA 147 (H) 07/07/2023 1112   NA 130 (L) 11/02/2013 0511   K 5.2 (H) 04/03/2024 0359   K 3.9 11/02/2013 0511   CL 107 04/03/2024 0359   CL 100 11/02/2013 0511   CO2 27 04/03/2024 0359   CO2 18 (L) 11/02/2013 0511   GLUCOSE 135 (H) 04/03/2024 0359   GLUCOSE 102 (H) 11/02/2013 0511   BUN 47 (H) 04/03/2024 0359   BUN 48 (H) 07/07/2023 1112   BUN 39 (H) 11/02/2013 0511   CREATININE 2.77 (H) 04/03/2024 0359   CREATININE 2.64 (H) 11/02/2013 0511   CALCIUM  8.7 (L) 04/03/2024 0359   CALCIUM  7.7 (L) 11/02/2013 0511   PROT 9.3 (H) 04/03/2024 0359   PROT 8.4 (H) 08/04/2011 1233   ALBUMIN 3.9 04/03/2024 0359   ALBUMIN 3.9 08/04/2011 1233   AST 17 04/03/2024 0359   AST 97 (H) 08/04/2011 1233   ALT 7 04/03/2024 0359   ALT 45 08/04/2011 1233   ALKPHOS 62 04/03/2024 0359   ALKPHOS 36 (L) 08/04/2011 1233   BILITOT 0.4 04/03/2024 0359   BILITOT 0.4 08/04/2011 1233   GFRNONAA 25 (L) 04/03/2024 0359   GFRNONAA 27 (L) 11/02/2013 0511   GFRAA 31 (L) 11/02/2013 0511    Radiological Exams on Admission: CT HEAD WO CONTRAST ( ) Result Date: 04/03/2024 EXAM: CT HEAD AND CERVICAL SPINE 04/03/2024 04:32:45 AM TECHNIQUE: CT of the head and cervical spine was performed without the administration of intravenous contrast. Multiplanar reformatted images are provided for review. Automated exposure control, iterative reconstruction, and/or weight based adjustment of the mA/kV was utilized to reduce  the radiation dose to as low as reasonably achievable. COMPARISON: Head CT 09/09/2020. No prior cervical spine imaging. CLINICAL HISTORY: Head trauma, abnormal mental status (Age 69-64y) FINDINGS: CT HEAD BRAIN AND VENTRICLES: No acute intracranial hemorrhage. No mass effect or midline shift. No abnormal extra-axial fluid collection. There is encephalomalacia again noted due to a chronic right MCA infarct, centered in the right gangliocapsular area with extension into the posterior right frontal and anterior right parietal lobes and  insula. Ex vacuo prominence of the right lateral ventricle is again noted as well. There is atrophy with atrophic ventriculomegaly and moderate to severe small vessel disease, which is advanced for age. There is a chronic lacunar infarct in the left basal ganglia. Asymmetric atrophy in the left cerebellar hemisphere. No cortical-based acute infarct. No hydrocephalus. There are patchy calcifications in both siphons. There are no hyperdense vessels. ORBITS: No acute abnormality. SINUSES AND MASTOIDS: There is chronic fluid in the right mastoid tip. There is increased left maxillary sinus membrane thickening with trace intracavitary fluid. The other paranasal sinuses and left mastoid air cells are clear. SOFT TISSUES AND SKULL: No acute skull fracture. No acute soft tissue abnormality. CT CERVICAL SPINE LIMITATIONS: Imaging of the cervical spine is limited due to patient motion. BONES AND ALIGNMENT: There is a mild cervical kypholevoscoliosis, centered at C3-4. There is a 2 to 3 mm grade 1 retrolisthesis at C3-C4 and C5-C6, both believed to be related to degenerative disc disease. No traumatic malalignment is seen. No widening of the anterior atlantoaxial joint. No acute cervical fracture is seen through the motion artifacts. DEGENERATIVE CHANGES: There is disc collapse with a chronic appearance and mild endplate irregularities at C3-4 and C5-6, mild disc space loss at C4-C5 and moderate  disc space loss at C6-C7. The disc spaces at C2-3, C7-T1 are normal in height. There is mild facet joint hypertrophy at the upper cervical levels, with mild left foraminal stenosis at C3-4 and C4-5. All other foramina are clear. There are anterior and posterior osteophytes from C3-4 through C6-7. There are posterior osteophytes encroaching on the ventral cord surface at C3-4, C4-5 and C5-6, with mild spinal stenosis at these levels. Other levels do not show significant encroachment on the thecal sac allowing for motion artifact. VASCULATURE: Both Proximal cervical ICAs are heavily calcified. SOFT TISSUES: There are stones in both palatine tonsils. No laryngeal mass is seen. No prevertebral soft tissue swelling. IMPRESSION: 1. No acute intracranial CT findings or depressed skull fractures. 2. Encephalomalacia due to a chronic right MCA infarct with ex vacuo prominence of the right lateral ventricle. 3. Atrophy with atrophic ventriculomegaly and moderate to severe small vessel disease, advanced for age. 4. Chronic lacunar infarct in the left basal ganglia. 5. Asymmetric atrophy in the left cerebellar hemisphere. 6. Mild cervical kypholevoscoliosis centered at C3-4. 7. 2 to 3 mm grade 1 retrolisthesis at C3-C4 and C5-C6, likely related to degenerative disc disease. 8. Disc collapse with a chronic appearance and mild endplate irregularities at C3-4 and C5-6, mild disc space loss at C4-C5, and moderate disc space loss at C6-C7. Spondylosis. 9. Mild left foraminal stenosis at C3-4 and C4-5. All other foramina are clear. 10. Posterior osteophytes encroaching on the ventral cord surface at C3-4, C4-5, and C5-6, with mild spinal stenosis at these levels. Other levels do not show significant encroachment on the thecal sac, allowing for motion artifact. Electronically signed by: Francis Quam MD 04/03/2024 05:09 AM EST RP Workstation: [id number]   CT Cervical Spine Wo Contrast Result Date: 04/03/2024 EXAM: CT HEAD AND  CERVICAL SPINE 04/03/2024 04:32:45 AM TECHNIQUE: CT of the head and cervical spine was performed without the administration of intravenous contrast. Multiplanar reformatted images are provided for review. Automated exposure control, iterative reconstruction, and/or weight based adjustment of the mA/kV was utilized to reduce the radiation dose to as low as reasonably achievable. COMPARISON: Head CT 09/09/2020. No prior cervical spine imaging. CLINICAL HISTORY: Head trauma, abnormal mental status (Age 39-64y) FINDINGS: CT HEAD  BRAIN AND VENTRICLES: No acute intracranial hemorrhage. No mass effect or midline shift. No abnormal extra-axial fluid collection. There is encephalomalacia again noted due to a chronic right MCA infarct, centered in the right gangliocapsular area with extension into the posterior right frontal and anterior right parietal lobes and insula. Ex vacuo prominence of the right lateral ventricle is again noted as well. There is atrophy with atrophic ventriculomegaly and moderate to severe small vessel disease, which is advanced for age. There is a chronic lacunar infarct in the left basal ganglia. Asymmetric atrophy in the left cerebellar hemisphere. No cortical-based acute infarct. No hydrocephalus. There are patchy calcifications in both siphons. There are no hyperdense vessels. ORBITS: No acute abnormality. SINUSES AND MASTOIDS: There is chronic fluid in the right mastoid tip. There is increased left maxillary sinus membrane thickening with trace intracavitary fluid. The other paranasal sinuses and left mastoid air cells are clear. SOFT TISSUES AND SKULL: No acute skull fracture. No acute soft tissue abnormality. CT CERVICAL SPINE LIMITATIONS: Imaging of the cervical spine is limited due to patient motion. BONES AND ALIGNMENT: There is a mild cervical kypholevoscoliosis, centered at C3-4. There is a 2 to 3 mm grade 1 retrolisthesis at C3-C4 and C5-C6, both believed to be related to degenerative  disc disease. No traumatic malalignment is seen. No widening of the anterior atlantoaxial joint. No acute cervical fracture is seen through the motion artifacts. DEGENERATIVE CHANGES: There is disc collapse with a chronic appearance and mild endplate irregularities at C3-4 and C5-6, mild disc space loss at C4-C5 and moderate disc space loss at C6-C7. The disc spaces at C2-3, C7-T1 are normal in height. There is mild facet joint hypertrophy at the upper cervical levels, with mild left foraminal stenosis at C3-4 and C4-5. All other foramina are clear. There are anterior and posterior osteophytes from C3-4 through C6-7. There are posterior osteophytes encroaching on the ventral cord surface at C3-4, C4-5 and C5-6, with mild spinal stenosis at these levels. Other levels do not show significant encroachment on the thecal sac allowing for motion artifact. VASCULATURE: Both Proximal cervical ICAs are heavily calcified. SOFT TISSUES: There are stones in both palatine tonsils. No laryngeal mass is seen. No prevertebral soft tissue swelling. IMPRESSION: 1. No acute intracranial CT findings or depressed skull fractures. 2. Encephalomalacia due to a chronic right MCA infarct with ex vacuo prominence of the right lateral ventricle. 3. Atrophy with atrophic ventriculomegaly and moderate to severe small vessel disease, advanced for age. 4. Chronic lacunar infarct in the left basal ganglia. 5. Asymmetric atrophy in the left cerebellar hemisphere. 6. Mild cervical kypholevoscoliosis centered at C3-4. 7. 2 to 3 mm grade 1 retrolisthesis at C3-C4 and C5-C6, likely related to degenerative disc disease. 8. Disc collapse with a chronic appearance and mild endplate irregularities at C3-4 and C5-6, mild disc space loss at C4-C5, and moderate disc space loss at C6-C7. Spondylosis. 9. Mild left foraminal stenosis at C3-4 and C4-5. All other foramina are clear. 10. Posterior osteophytes encroaching on the ventral cord surface at C3-4, C4-5,  and C5-6, with mild spinal stenosis at these levels. Other levels do not show significant encroachment on the thecal sac, allowing for motion artifact. Electronically signed by: Francis Quam MD 04/03/2024 05:09 AM EST RP Workstation: [id number]   CT CHEST ABDOMEN PELVIS WO CONTRAST Result Date: 04/03/2024 CLINICAL DATA:  Increasing weakness. EXAM: CT CHEST, ABDOMEN AND PELVIS WITHOUT CONTRAST TECHNIQUE: Multidetector CT imaging of the chest, abdomen and pelvis was performed following the standard  protocol without IV contrast. RADIATION DOSE REDUCTION: This exam was performed according to the departmental dose-optimization program which includes automated exposure control, adjustment of the mA and/or kV according to patient size and/or use of iterative reconstruction technique. COMPARISON:  Chest CT 08/20/2020 FINDINGS: CT CHEST FINDINGS Cardiovascular: Heart size upper normal. No substantial pericardial effusion. Coronary artery calcification is evident. Mild atherosclerotic calcification is noted in the wall of the thoracic aorta. Ascending thoracic aorta measures up to 4.2 cm diameter. Mediastinum/Nodes: No mediastinal lymphadenopathy. No evidence for gross hilar lymphadenopathy although assessment is limited by the lack of intravenous contrast on the current study. The esophagus has normal imaging features. Axillary lymphadenopathy noted bilaterally. Left axillary index lymph node measures 17 mm short axis on 20/2. Lungs/Pleura: Centrilobular and paraseptal emphysema evident. 8 mm right middle lobe nodule on 134/4. 6 mm right lower lobe nodule on 132/4. Consolidative airspace disease in the left lower lobe. No substantial pleural effusion. Musculoskeletal: No worrisome lytic or sclerotic osseous abnormality. CT ABDOMEN PELVIS FINDINGS Hepatobiliary: No suspicious focal abnormality in the liver on this study without intravenous contrast. Gallbladder not well seen likely due to nondistention. No intra or  extrahepatic biliary duct dilatation. Pancreas: There is marked diffuse dilatation of the main pancreatic duct measuring up to 7 mm diameter in the pancreatic tail region. Pancreatic duct becomes obscured near the junction of the body and tail (image 81/2) and again appears distended through the pancreatic head region. Imaging of the pancreas is limited by lack of intravenous contrast material. Spleen: No splenomegaly. No suspicious focal mass lesion. Adrenals/Urinary Tract: No adrenal nodule or mass. 2.5 x 2.6 cm heterogeneous exophytic lesion identified lower pole right kidney, potentially with some mineralization. Left kidney unremarkable. No hydroureter. The urinary bladder appears normal for the degree of distention. Stomach/Bowel: Diffuse gastric wall thickening evident, likely accentuated by underdistention. Duodenum is normally positioned as is the ligament of Treitz. No small bowel wall thickening. No small bowel dilatation. No gross colonic mass. No colonic wall thickening. Moderate stool volume evident. Vascular/Lymphatic: There is advanced atherosclerotic calcification of the abdominal aorta without aneurysm. There is no gastrohepatic or hepatoduodenal ligament lymphadenopathy. 12 mm short axis left para-aortic lymph node evident. Other mildly enlarged retroperitoneal lymph nodes are identified. 13 mm short axis right common iliac node on 106/2. Mild lymphadenopathy is identified in the groin regions. Reproductive: Prostate gland is enlarged. Other: No substantial intraperitoneal free fluid. Musculoskeletal: Extremely large left groin hernia contains small bowel and colon. There is some congestion in the small bowel mesentery of the herniated segments with probable small volume fluid in the hernia sac. No features to suggest overt bowel obstruction due to the herniated bowel segments. No worrisome lytic or sclerotic osseous abnormality. IMPRESSION: 1. Consolidative airspace disease in the left lower lobe  compatible pneumonia. Imaging follow-up recommended to ensure complete resolution. 2. Extremely large left groin hernia contains small bowel and colon. There is some congestion in the small bowel mesentery of the herniated segments with probable small volume fluid in the hernia sac. No features to suggest overt bowel obstruction due to the herniated bowel segments. Correlation for signs/symptoms of incarceration recommended. 3. Marked diffuse dilatation of the main pancreatic duct measuring up to 7 mm diameter in the pancreatic tail region. Pancreatic duct becomes obscured near the junction of the body and tail without a discernible mass lesion on noncontrast imaging. Main pancreatic duct is also distended through the pancreatic head region. Imaging of the pancreas is limited by lack of intravenous  contrast material. MRI of the abdomen with and without contrast recommended to further evaluate. 4. 2.5 x 2.6 cm heterogeneous exophytic lesion lower pole right kidney, potentially with some mineralization. Findings raise concern for renal cell carcinoma. This could also be further assessed at the time of MRI. 5. Axillary, retroperitoneal, and groin lymphadenopathy. Lymphoproliferative disorder not excluded. 6. 8 mm right middle lobe and 6 mm right lower lobe pulmonary nodules. Non-contrast chest CT at 3-6 months is recommended. If the nodules are stable at time of repeat CT, then future CT at 18-24 months (from today's scan) is considered optional for low-risk patients, but is recommended for high-risk patients. This recommendation follows the consensus statement: Guidelines for Management of Incidental Pulmonary Nodules Detected on CT Images: From the Fleischner Society 2017; Radiology 2017; 284:228-243. 7. 4.2 cm ascending thoracic aortic aneurysm. Recommend annual imaging followup by CTA or MRA. This recommendation follows 2010 ACCF/AHA/AATS/ACR/ASA/SCA/SCAI/SIR/STS/SVM Guidelines for the Diagnosis and Management of  Patients with Thoracic Aortic Disease. Circulation. 2010; 121: [id number]. Aortic aneurysm NOS (ICD10-I71.9) 8. Aortic Atherosclerosis (ICD10-I70.0) and Emphysema (ICD10-J43.9). Electronically Signed   By: Camellia Candle M.D.   On: 04/03/2024 05:02   EKG: Independently reviewed. NSR. HR 81  DVT prophylaxis: enoxaparin  (LOVENOX ) injection 30 mg Start: 04/03/24 1000  Code Status: full  Family Communication: left VM for emergency contact Disposition Plan: Admit to progressive Consults called: General Surgery  Marien LITTIE Piety, DO [facility]  04/03/2024, 7:39 AM    To contact the appropriate [facility] Attending or Consulting provider: Check [email or web address] for coverage from 7pm-7am

## 2024-04-03 NOTE — Consult Note (Signed)
 [facility] SURGICAL CONSULTATION NOTE (initial) - cpt: 00756   HISTORY OF PRESENT ILLNESS (HPI):  63 y.o. male presented to [facility] ED today for evaluation of weakness. Patient reports a recent history of progressive weakness over the last 48-72 hours. He can not provide much more additional history at this time. 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 Per EMS he was found to be hypoxic to the 50s on their arrival and his home O2 tank was empty. Of note, he was found to have significant bed bugs. During his work up in the ED, he was found to have a large left inguinal hernia with loops of small and large bowel without evidence of obstruction or bowel compromise. He was unable to tell me for how long this has been present. He denied any pain and has had BM today. Work up in the ED revealed a leukocytosis to 11.7K, Hgb to 7.9 (previous 8.8), hyperkalemia to 5.2, sCr 2.77, venous lactate normal at 0.6, venous pH 7.21, venous pCO2 78, bicarbonate 31.2. Also found to have possible pneumonia, renal mass. He was admitted to the medicine service.   Surgery is consulted by emergency medicine physician Dr. Ronal Lewandowsky, MD in this context for evaluation and management of left inguinal hernia.  PAST MEDICAL HISTORY (PMH):  Past Medical History:  Diagnosis Date   CHF (congestive heart failure) (HCC)    COPD (chronic obstructive pulmonary disease) (HCC)    Hypertension    Seizures (HCC)      PAST SURGICAL HISTORY (PSH):  History reviewed. No pertinent surgical history.   MEDICATIONS:  Prior to Admission medications   Medication Sig Start Date End Date Taking? Authorizing Provider  amLODipine  (NORVASC ) 10 MG tablet Take 10 mg by mouth every evening. 07/28/20  Yes [provider]  aspirin  81 MG EC tablet Take 81 mg by mouth in the morning.   Yes [provider]  atorvastatin  (LIPITOR) 20 MG tablet Take 20 mg by mouth every evening. 07/28/20  Yes [provider]  bisoprolol  (ZEBETA ) 5 MG tablet One twice daily 01/16/21  Yes Wert, Michael B, MD  cholecalciferol (VITAMIN D3) 25 MCG (1000 UNIT) tablet Take 1,000 Units by mouth daily. 02/29/24  Yes [provider]  FARXIGA  10 MG TABS tablet Take 1 tablet (10 mg total) by mouth daily before breakfast. 09/20/23  Yes Donette Ellouise LABOR, FNP  FEROSUL 325 (65 Fe) MG tablet Take 325 mg by mouth daily with breakfast. 02/29/24  Yes [provider]  folic acid  (FOLVITE ) 1 MG tablet Take 1 mg by mouth daily. 04/17/20  Yes [provider]  furosemide  (LASIX ) 20 MG tablet Take 20 mg by mouth daily. 02/29/24  Yes [provider]  levETIRAcetam  (KEPPRA ) 750 MG tablet Take 1 tablet (750 mg total) by mouth 2 (two) times daily. 09/12/20  Yes Pokhrel, Laxman, MD  Multiple Vitamin (MULTIVITAMIN ADULT) TABS Take 2 tablets by mouth daily.   Yes [provider]  omeprazole (PRILOSEC) 20 MG capsule Take 20 mg by mouth daily before breakfast. 07/28/20  Yes [provider]  potassium chloride  (KLOR-CON ) 10 MEQ tablet Take 10 mEq by mouth daily. 11/17/20  Yes [provider]  PROAIR  HFA 108 (90 Base) MCG/ACT inhaler Inhale 2 puffs into the lungs every 6 (six) hours as needed for shortness of breath or wheezing. 07/28/20  Yes [provider]  thiamine  (VITAMIN B1) 100 MG tablet Take 100 mg by mouth daily. 02/29/24  Yes  [provider]  Tiotropium Bromide -Olodaterol (STIOLTO RESPIMAT ) 2.5-2.5 MCG/ACT AERS Inhale 2 puffs into the lungs daily. Pt needs to make an appointment to continue to receive refills please. 01/05/22  Yes [name], MD  traZODone  (DESYREL ) 50 MG tablet Take 1 tablet (50 mg total) by mouth at bedtime. 03/05/21  Yes Hackney, Ellouise A, FNP  hydrALAZINE  (APRESOLINE ) 50 MG tablet Take 0.5 tablets (25 mg total) by mouth 3 (three) times daily. Patient not taking: Reported on 04/03/2024 03/05/21   Donette Ellouise LABOR, FNP  thiamine  100 MG tablet  Take 100 mg by mouth daily. Patient not taking: Reported on 04/03/2024 11/17/20   [provider]     ALLERGIES:  No Known Allergies   SOCIAL HISTORY:  Social History   Socioeconomic History   Marital status: Divorced    Spouse name: Not on file   Number of children: Not on file   Years of education: Not on file   Highest education level: Not on file  Occupational History   Not on file  Tobacco Use   Smoking status: Former    Current packs/day: 0.00    Types: Cigarettes    Quit date: 2021    Years since quitting: 4.9   Smokeless tobacco: Never  Vaping Use   Vaping status: Never Used  Substance and Sexual Activity   Alcohol use: Yes    Alcohol/week: 3.0 standard drinks of alcohol    Types: 3 Cans of beer per week    Comment: daily   Drug use: Not Currently   Sexual activity: Not on file  Other Topics Concern   Not on file  Social History Narrative   Not on file   Social Drivers of Health   Financial Resource Strain: Not on file  Food Insecurity: Not on file  Transportation Needs: Not on file  Physical Activity: Not on file  Stress: Not on file  Social Connections: Not on file  Intimate Partner Violence: Not on file     FAMILY HISTORY:  History reviewed. No pertinent family history.    REVIEW OF SYSTEMS:  Review of Systems  Unable to perform ROS: Mental acuity  Gastrointestinal:        + Left inguinal hernia     VITAL SIGNS:  Temp:  [98.3 F (36.8 C)] 98.3 F (36.8 C) (11/25 0339) Pulse Rate:  [60-79] 64 (11/25 0530) Resp:  [14-17] 17 (11/25 0530) BP: (105-125)/(71-75) 125/73 (11/25 0530) SpO2:  [97 %-100 %] 100 % (11/25 0530) FiO2 (%):  [50 %] 50 % (11/25 0436) Weight:  [51.7 kg] 51.7 kg (11/25 0341)     Height: 5' 7 (170.2 cm) Weight: 51.7 kg BMI (Calculated): 17.85   INTAKE/OUTPUT:  11/24 0701 - 11/25 0700 In: 100 [IV Piggyback:100] Out: -   PHYSICAL EXAM:  Physical Exam Vitals and nursing note reviewed. Exam conducted with a  chaperone present.  Constitutional:      General: He is not in acute distress.    Appearance: Normal appearance. He is not ill-appearing.     Interventions: Nasal cannula in place.     Comments: Patient resting in bed; arouses to verbal stimuli, does not participate reliably in history   HENT:     Head: Normocephalic and atraumatic.  Eyes:     General: No scleral icterus.    Conjunctiva/sclera: Conjunctivae normal.  Cardiovascular:     Rate and Rhythm: Bradycardia present.     Pulses: Normal pulses.     Comments: Bradycardic to  54 bpm  Pulmonary:     Effort: Pulmonary effort is normal. No respiratory distress.  Abdominal:     General: Abdomen is flat.     Palpations: Abdomen is soft.     Tenderness: There is no abdominal tenderness. There is no guarding or rebound.     Hernia: A hernia is present. Hernia is present in the left inguinal area.     Comments: Patient with large left inguinal hernia, this is soft, non-tender, no overlaying skin changes   Skin:    General: Skin is warm and dry.      Labs:     Latest Ref Rng & Units 04/03/2024    3:58 AM 02/01/2024   10:35 AM 07/07/2023   11:12 AM  CBC  WBC 4.0 - 10.5 K/uL 11.7  8.0  11.3   Hemoglobin 13.0 - 17.0 g/dL 7.9  8.8  9.9   Hematocrit 39.0 - 52.0 % 28.0  30.1  30.8   Platelets 150 - 400 K/uL 180  138  152       Latest Ref Rng & Units 04/03/2024    3:59 AM 02/01/2024   10:35 AM 07/07/2023   11:12 AM  CMP  Glucose 70 - 99 mg/dL 864  899  94   BUN 8 - 23 mg/dL 47  46  48   Creatinine 0.61 - 1.24 mg/dL 7.22  7.56  7.78   Sodium 135 - 145 mmol/L 145  145  147   Potassium 3.5 - 5.1 mmol/L 5.2  4.5  5.0   Chloride 98 - 111 mmol/L 107  108  107   CO2 22 - 32 mmol/L 27  27  27    Calcium  8.9 - 10.3 mg/dL 8.7  9.0  9.4   Total Protein 6.5 - 8.1 g/dL 9.3  8.2    Total Bilirubin 0.0 - 1.2 mg/dL 0.4  0.3    Alkaline Phos 38 - 126 U/L 62  42    AST 15 - 41 U/L 17  19    ALT 0 - 44 U/L 7  12       Imaging studies:   CT  Chest/Abdomen/Pelvis (04/03/2024) personally reviewed with large left inguinal hernia containing large and small bowel, no evidence of obstruction, no evidence of bowel compromise, and radiologist report reviewed below:  IMPRESSION: 1. Consolidative airspace disease in the left lower lobe compatible pneumonia. Imaging follow-up recommended to ensure complete resolution. 2. Extremely large left groin hernia contains small bowel and colon. There is some congestion in the small bowel mesentery of the herniated segments with probable small volume fluid in the hernia sac. No features to suggest overt bowel obstruction due to the herniated bowel segments. Correlation for signs/symptoms of incarceration recommended. 3. Marked diffuse dilatation of the main pancreatic duct measuring up to 7 mm diameter in the pancreatic tail region. Pancreatic duct becomes obscured near the junction of the body and tail without a discernible mass lesion on noncontrast imaging. Main pancreatic duct is also distended through the pancreatic head region. Imaging of the pancreas is limited by lack of intravenous contrast material. MRI of the abdomen with and without contrast recommended to further evaluate. 4. 2.5 x 2.6 cm heterogeneous exophytic lesion lower pole right kidney, potentially with some mineralization. Findings raise concern for renal cell carcinoma. This could also be further assessed at the time of MRI. 5. Axillary, retroperitoneal, and groin lymphadenopathy. Lymphoproliferative disorder not excluded. 6. 8 mm right middle lobe and 6 mm right  lower lobe pulmonary nodules. Non-contrast chest CT at 3-6 months is recommended. If the nodules are stable at time of repeat CT, then future CT at 18-24 months (from today's scan) is considered optional for low-risk patients, but is recommended for high-risk patients. This recommendation follows the consensus statement: Guidelines for Management of Incidental  Pulmonary Nodules Detected on CT Images: From the Fleischner Society 2017; Radiology 2017; 284:228-243. 7. 4.2 cm ascending thoracic aortic aneurysm. Recommend annual imaging followup by CTA or MRA. This recommendation follows 2010 ACCF/AHA/AATS/ACR/ASA/SCA/SCAI/SIR/STS/SVM Guidelines for the Diagnosis and Management of Patients with Thoracic Aortic Disease. Circulation. 2010; 121: Z733-z630. Aortic aneurysm NOS (ICD10-I71.9) 8. Aortic Atherosclerosis (ICD10-I70.0) and Emphysema (ICD10-J43.9).   Assessment/Plan: 63 y.o. male with large left inguinal hernia without evidence of obstruction or bowel compromise, complicated by pertinent comorbidities including failure to thrive, history of CVA, COPD.   - In regards to his inguinal hernia, this is quite large and there remains without evidence of obstruction nor bowel compromise. I do not think he needs any urgent repair of this at this time. He will likely benefit from evaluation at tertiary center for repair once recovered from this insult. He certainly not an optimal candidate for intervention currently - Okay for diet from surgical perspective - Monitor abdominal examination; on-going bowel function  - Further management per primary service  All of the above findings and recommendations were discussed with the patient and the medical team.   Thank you for the opportunity to participate in this patient's care.   -- Arthea Platt, PA-C [facility] 04/03/2024, 7:42 AM M-F: 7am - 4pm

## 2024-04-03 NOTE — Evaluation (Signed)
 Occupational Therapy Evaluation Patient Details Name: Darin Hopkins MRN: [id number] DOB: 1961/01/09 Today's Date: 04/03/2024   History of Present Illness   Darin Hopkins is a 63 y.o. male with a PMH significant for COPD, HTN, left inguinal hernia, GERD, CHF, history of seizure, HLD, alcohol dependence, protein calorie malnutrition, CKD 3.     They presented from home to the ED on 04/03/2024 with concerns for considerable weakness x 2 days.  Per EMS report, patient's O2 saturations on their initial evaluation were in the 50s which appears to be due to the fact that his oxygen tank was empty and chronically is supposed to be on 2 to 3 L.  They also reported significant bedbugs in his home.  He was put on home oxygen with significant improvement.     Clinical Impressions Patient was seen for OT evaluation this date. Prior to hospital admission, patient was living with sister and brother in law, he reports being sedentary and performing minimal self care. Patient on 3L of O2 at start of OT tx, after ambulating with HHA about 15 feet to bathroom on 3L, O2 dropped to 70, RN present and increased to 6L during toileting, O2 improved to 84%; patient with poor return demo of PLB techniques, once back in bed on 6L, he returned to 100%, turned down to 3L and stayed above 95%. Patient presents with deficits in poor safety awareness, poor activity tolerance and decreased dynamic standing balance, affecting safe and optimal ADL completion. Patient is currently requiring min-mod A for ADLs.  Paient would benefit from skilled OT services to address noted impairments and functional limitations (see below for any additional details) in order to maximize safety and independence while minimizing future risk of falls, injury, and readmission. Anticipate the need for follow up OT services upon acute hospital DC.      If plan is discharge home, recommend the following:   A little help with walking and/or transfers;A  little help with bathing/dressing/bathroom;Supervision due to cognitive status     Functional Status Assessment   Patient has had a recent decline in their functional status and demonstrates the ability to make significant improvements in function in a reasonable and predictable amount of time.     Equipment Recommendations   None recommended by OT     Recommendations for Other Services         Precautions/Restrictions   Precautions Precautions: Fall;Other (comment) (came in with significant bed bugs, has been decon) Recall of Precautions/Restrictions: Impaired Restrictions Weight Bearing Restrictions Per Provider Order: No     Mobility Bed Mobility Overal bed mobility: Modified Independent             General bed mobility comments: HOB raised    Transfers Overall transfer level: Needs assistance Equipment used: 1 person hand held assist Transfers: Sit to/from Stand Sit to Stand: Min assist, From elevated surface           General transfer comment: min A from elevated ED bed      Balance Overall balance assessment: Needs assistance Sitting-balance support: No upper extremity supported, Feet unsupported Sitting balance-Leahy Scale: Good     Standing balance support: During functional activity, Single extremity supported, Bilateral upper extremity supported Standing balance-Leahy Scale: Fair                             ADL either performed or assessed with clinical judgement   ADL Overall ADL's :  Needs assistance/impaired     Grooming: Minimal assistance;Wash/dry hands;Standing   Upper Body Bathing: Minimal assistance   Lower Body Bathing: Moderate assistance   Upper Body Dressing : Minimal assistance   Lower Body Dressing: Moderate assistance   Toilet Transfer: Minimal Actuary and Hygiene: Minimal assistance         General ADL Comments: performed toileting on  standard height toilet     Vision         Perception         Praxis         Pertinent Vitals/Pain Pain Assessment Pain Assessment: No/denies pain     Extremity/Trunk Assessment Upper Extremity Assessment Upper Extremity Assessment: Generalized weakness   Lower Extremity Assessment Lower Extremity Assessment: Defer to PT evaluation       Communication Communication Communication: Impaired Factors Affecting Communication: Hearing impaired   Cognition Arousal: Alert Behavior During Therapy: WFL for tasks assessed/performed                                 Following commands: Impaired Following commands impaired: Follows one step commands inconsistently, Follows one step commands with increased time     Cueing  General Comments   Cueing Techniques: Verbal cues;Tactile cues  3L of O2,  increased to 6L with activity   Exercises     Shoulder Instructions      Home Living Family/patient expects to be discharged to:: Private residence Living Arrangements: Other relatives Available Help at Discharge: Family Type of Home: Mobile home Home Access: Stairs to enter Entrance Stairs-Number of Steps: 1   Home Layout: One level     Bathroom Shower/Tub: Sponge bathes at baseline   Bathroom Toilet: Standard Bathroom Accessibility: No   Home Equipment: Agricultural Consultant (2 wheels);Wheelchair - manual;BSC/3in1          Prior Functioning/Environment Prior Level of Function : Needs assist;Patient poor historian/Family not available       Physical Assist : ADLs (physical);Mobility (physical) Mobility (physical): Transfers;Gait ADLs (physical): Grooming;Bathing;Dressing;Toileting;IADLs Mobility Comments: uses walker at baseline ADLs Comments: sedentary, minimal particpiation with ADLs    OT Problem List: Decreased strength;Decreased activity tolerance;Decreased safety awareness   OT Treatment/Interventions: Self-care/ADL training;Therapeutic  exercise;Energy conservation;DME and/or AE instruction;Therapeutic activities;Patient/family education;Balance training      OT Goals(Current goals can be found in the care plan section)   Acute Rehab OT Goals Patient Stated Goal: to go home OT Goal Formulation: With patient Time For Goal Achievement: 04/17/24 Potential to Achieve Goals: Good ADL Goals Pt Will Perform Grooming: with modified independence;standing Pt Will Perform Lower Body Dressing: with modified independence;sit to/from stand Pt Will Transfer to Toilet: with modified independence;ambulating;regular height toilet Pt Will Perform Toileting - Clothing Manipulation and hygiene: with modified independence;sit to/from stand   OT Frequency:  Min 2X/week    Co-evaluation              AM-PAC OT 6 Clicks Daily Activity     Outcome Measure Help from another person eating meals?: None Help from another person taking care of personal grooming?: None Help from another person toileting, which includes using toliet, bedpan, or urinal?: A Little Help from another person bathing (including washing, rinsing, drying)?: A Little Help from another person to put on and taking off regular upper body clothing?: None Help from another person to put on and taking off regular lower body clothing?: A Little 6 Click  Score: 21   End of Session Equipment Utilized During Treatment: Oxygen Nurse Communication: Mobility status  Activity Tolerance: Patient tolerated treatment well Patient left: in bed;with call bell/phone within reach;with bed alarm set  OT Visit Diagnosis: Unsteadiness on feet (R26.81);Muscle weakness (generalized) (M62.81);History of falling (Z91.81);Other abnormalities of gait and mobility (R26.89)                Time: 8577-8542 OT Time Calculation (min): 35 min Charges:  OT General Charges $OT Visit: 1 Visit OT Evaluation $OT Eval Low Complexity: 1 Low OT Treatments $Self Care/Home Management : 23-37  mins  Rogers Clause, OT/L [facility], 04/03/2024

## 2024-04-03 NOTE — ED Notes (Signed)
 At this time, this EDT helped this pt ambulate with a walker to the BR. Pt is now back in bed, no other requests at this time, call light within reach.

## 2024-04-03 NOTE — Progress Notes (Signed)
 PHARMACIST - PHYSICIAN COMMUNICATION  CONCERNING:  Enoxaparin  (Lovenox ) for DVT Prophylaxis    RECOMMENDATION: Patient was prescribed enoxaprin 40mg  q24 hours for VTE prophylaxis.   Filed Weights   04/03/24 0341  Weight: 51.7 kg (114 lb)    Body mass index is 17.85 kg/m.  Estimated Creatinine Clearance: 20 mL/min (A) (by C-G formula based on SCr of 2.77 mg/dL (H)).  Patient is candidate for enoxaparin  30mg  every 24 hours based on CrCl <25ml/min or Weight <45kg  DESCRIPTION: Pharmacy has adjusted enoxaparin  dose per [facility] policy.  Patient is now receiving enoxaparin  30 mg every 24 hours   Rankin CANDIE Dills, PharmD, [facility] 04/03/2024 5:40 AM

## 2024-04-03 NOTE — ED Notes (Signed)
 Pt given a lunch tray

## 2024-04-03 NOTE — ED Notes (Signed)
 This EDT rounded on this pt, pt resting at this time.

## 2024-04-03 NOTE — ED Notes (Signed)
 Verified with Dr Ernest, blood cultures not indicated due to patient not meeting sepsis criteria. IV antibiotics initiated.

## 2024-04-04 ENCOUNTER — Other Ambulatory Visit (HOSPITAL_COMMUNITY): Payer: Self-pay

## 2024-04-04 ENCOUNTER — Inpatient Hospital Stay

## 2024-04-04 ENCOUNTER — Telehealth (HOSPITAL_COMMUNITY): Payer: Self-pay

## 2024-04-04 DIAGNOSIS — J441 Chronic obstructive pulmonary disease with (acute) exacerbation: Secondary | ICD-10-CM | POA: Diagnosis not present

## 2024-04-04 DIAGNOSIS — R0689 Other abnormalities of breathing: Secondary | ICD-10-CM | POA: Diagnosis not present

## 2024-04-04 DIAGNOSIS — Z515 Encounter for palliative care: Secondary | ICD-10-CM | POA: Diagnosis not present

## 2024-04-04 DIAGNOSIS — Z7189 Other specified counseling: Secondary | ICD-10-CM

## 2024-04-04 DIAGNOSIS — R0603 Acute respiratory distress: Secondary | ICD-10-CM | POA: Diagnosis not present

## 2024-04-04 LAB — COMPREHENSIVE METABOLIC PANEL WITH GFR
ALT: 6 U/L (ref 0–44)
AST: 11 U/L — ABNORMAL LOW (ref 15–41)
Albumin: 3.2 g/dL — ABNORMAL LOW (ref 3.5–5.0)
Alkaline Phosphatase: 47 U/L (ref 38–126)
Anion gap: 6 (ref 5–15)
BUN: 55 mg/dL — ABNORMAL HIGH (ref 8–23)
CO2: 29 mmol/L (ref 22–32)
Calcium: 8.5 mg/dL — ABNORMAL LOW (ref 8.9–10.3)
Chloride: 110 mmol/L (ref 98–111)
Creatinine, Ser: 2.53 mg/dL — ABNORMAL HIGH (ref 0.61–1.24)
GFR, Estimated: 28 mL/min — ABNORMAL LOW (ref >60)
Glucose, Bld: 99 mg/dL (ref 70–99)
Potassium: 5.1 mmol/L (ref 3.5–5.1)
Sodium: 145 mmol/L (ref 135–145)
Total Bilirubin: 0.3 mg/dL (ref 0.0–1.2)
Total Protein: 7.8 g/dL (ref 6.5–8.1)

## 2024-04-04 LAB — CBC
HCT: 22.1 % — ABNORMAL LOW (ref 39.0–52.0)
Hemoglobin: 6.5 g/dL — ABNORMAL LOW (ref 13.0–17.0)
MCH: 27.5 pg (ref 26.0–34.0)
MCHC: 29.4 g/dL — ABNORMAL LOW (ref 30.0–36.0)
MCV: 93.6 fL (ref 80.0–100.0)
Platelets: 169 K/uL (ref 150–400)
RBC: 2.36 MIL/uL — ABNORMAL LOW (ref 4.22–5.81)
RDW: 12.3 % (ref 11.5–15.5)
WBC: 7.6 K/uL (ref 4.0–10.5)
nRBC: 0 % (ref 0.0–0.2)

## 2024-04-04 LAB — HIV ANTIBODY (ROUTINE TESTING W REFLEX): HIV Screen 4th Generation wRfx: NONREACTIVE

## 2024-04-04 MED ORDER — SODIUM CHLORIDE 0.9 % IV SOLN
2.0000 g | INTRAVENOUS | Status: AC
  Administered 2024-04-05 – 2024-04-09 (×5): 2 g via INTRAVENOUS
  Filled 2024-04-04 (×5): qty 20

## 2024-04-04 MED ORDER — LACTATED RINGERS IV SOLN: INTRAVENOUS | Status: DC

## 2024-04-04 MED ORDER — GADOBUTROL 1 MMOL/ML IV SOLN
5.0000 mL | Freq: Once | INTRAVENOUS | Status: AC | PRN
  Administered 2024-04-04: 5 mL via INTRAVENOUS

## 2024-04-04 MED ORDER — METHYLPREDNISOLONE SODIUM SUCC 40 MG IJ SOLR
40.0000 mg | Freq: Every day | INTRAMUSCULAR | Status: DC
  Administered 2024-04-04 – 2024-04-07 (×4): 40 mg via INTRAVENOUS
  Filled 2024-04-04 (×4): qty 1

## 2024-04-04 NOTE — Progress Notes (Signed)
 Palliative Care Progress Note, Assessment & Plan   Patient Name: Darin Hopkins       Date: 04/04/2024 DOB: 06-Jul-1960  Age: 63 y.o. MRN#: [id number] Attending Physician: Jhonny Calvin NOVAK, MD Primary Care Physician: [name], NP Admit Date: 04/03/2024  Subjective: Patient is sitting up in bed with shower In place.  He is awake, alert, acknowledges my presence, and is able to make his wishes known.  No family or friends present at bedside during my visit.  HPI: 63 y.o. male  with past medical history of COPD, HTN, left hernia, GERD, CHF, history of seizures, HLD, alcohol dependence, CKD stage III, and protein calorie malnutrition admitted on 04/03/2024 with weakness.   As per chart review, EMS reported that initial evaluation found patient's oxygen saturations to be in the 50s.  He is chronically on 2-3 L of nasal cannula but his oxygen tank was found empty.   Additionally, EMS reported significant bedbugs present in his home.   Patient was pan CT scan in the ED with several findings which included:   -Possible left lower lobe pneumonia on chest CT.   -Large left groin hernia without obstruction or incarceration. -Abnormal findings in the pancreas on CT scan were concerning for mass but not fully evaluated so radiology recommended abdominal MRI to further evaluate. -Also has mass on lower pole of right kidney which is consistent with renal cell carcinoma and can also be further evaluated with the MRI. -Additionally, multiple nodules were seen in the right lower lobe of his lungs which is recommended to have noncontrast chest CT follow-up in 3 to 6 months   She is being treated for left lower lobe pneumonia with Mucinex , DuoNeb, Solu-Medrol , ceftriaxone , and doxycycline .  Additionally, general  surgery was consulted and no urgent surgical need at this time.  Recommendation is for patient to follow-up outpatient if he wishes to pursue aggressive treatment of inguinal hernia.   Given mass on pancreas and right kidney, MRI was ordered -pending.   PMT was consulted to support patient and family with discussions on boundaries and goals of medical treatment.   Summary of counseling/coordination of care: Extensive chart review completed prior to meeting patient including labs, vital signs, imaging, progress notes, orders, and available advanced directive documents from current and previous encounters.   After reviewing the patient's chart and assessing the patient at bedside, I spoke with patient in regards to symptom management and goals of care.   Symptoms assessed.  Patient endorses he feels fine.  He denies headache, chest pain, N/V/D, and other acute issues at this time.  No adjustment to [facility] needed.  I attempted to gauge patient's understanding of his current medical situation.  He shares that he is awaiting his MRA.  Extensive discussion of MRI, CT scan results.  Specifically discussed that patient has a mass on his pancreas as well as a mass on his right kidney.  Reviewed that the MRI would be to follow-up on these findings rather than an MRI of his brain.  He inquired as to what this meant.  We discussed that it could be benign or malignant.  Reviewed that mass on the kidney is consistent with renal cell carcinoma.  However, further testing is being completed to help with his workup.  After lengthy discussion of CT scan results, I inquired about what is important to patient.  He shares that if it is cancer, he would likely not want any treatment for it.  Discussed potential modalities could involve chemotherapy but also immunotherapy, radiation, surgical intervention, etc.-we would not know the full extent of treatment options until oncology was able to review his labs and discussed with  him.  He shares that he would like to know if 1 it is in his abdomen but that he does not think he will ever be accepting of treatment for it.  He shares he would prefer to just go home and be like he is.  I shared that these discussions not mean anything final as far as his medical treatment.  However, it is helpful as workup continues to keep in mind what is important to him and what quality of life means for him.  He shares that quality of life is getting home and being with his sister.  He inquires about whether or not I have spoken with his sister.  I conveyed that I have had multiple attempts to speak with sister with no answer.  He is concerned that she is not answering the phone.  I assured him that we will continue to try to contact his sister.  Advance directives and next of kin decision maker reviewed again.  After extensive discussion of full code versus DNR status, patient endorses he would be accepting of ventilatory support and ACLS protocol.  Full code and full scope remain.  However, he was clear that he would not be accepting of long-term ventilatory support/trach/PEG.  After visiting with the patient, I again attempted to speak with his sister.  No answer and I am unable to leave a voicemail.  I counseled with attending.  Plan remains to continue to treat pneumonia and appropriate inpatient workup for newfound masses.  I am off service after today we will plan on following up with the patient on Friday.  If emergent palliative needs arise, please utilize [facility] for on-call palliative providers  Physical Exam Vitals reviewed.  Constitutional:      Comments: Thin, frail  HENT:     Head:     Comments: Temporal wasting    Mouth/Throat:     Mouth: Mucous membranes are moist.  Eyes:     Pupils: Pupils are equal, round, and reactive to light.  Pulmonary:     Effort: Pulmonary effort is normal.  Abdominal:     Palpations: Abdomen is soft.  Musculoskeletal:     Comments: MAETC   Skin:    General: Skin is warm and dry.  Neurological:     Mental Status: He is alert and oriented to person, place, and time.  Psychiatric:        Mood and Affect: Mood normal.        Behavior: Behavior normal.        Thought Content: Thought content normal.             50 minute visit includes: Detailed review of medical records (labs, imaging, vital signs), medically appropriate exam (mental status, respiratory, cardiac, skin), discussed with treatment team, counseling and educating patient, family and staff, documenting clinical information, medication management and coordination of care.  Lamarr L. Arvid, DNP, FNP-BC Palliative Medicine Team

## 2024-04-04 NOTE — Progress Notes (Signed)
 PROGRESS NOTE    LEVELL TAVANO  FMW:[id number] DOB: Jul 09, 1960 DOA: 04/03/2024 PCP: [name], NP    Brief Narrative:  63 y.o. male with a PMH significant for COPD, HTN, left inguinal hernia, GERD, CHF, history of seizure, HLD, alcohol dependence, protein calorie malnutrition, CKD 3.   They presented from home to the ED on 04/03/2024 with concerns for considerable weakness x 2 days.  Per EMS report, patient's O2 saturations on their initial evaluation were in the 50s which appears to be due to the fact that his oxygen tank was empty and chronically is supposed to be on 2 to 3 L.  They also reported significant bedbugs in his home.  He was put on home oxygen with significant improvement.   In the ED, it was found that they had stable vital signs on 2 L nasal cannula.  It appears that patient was put on BiPAP at some point earlier this morning but there is no documented desaturation or reason for this and when I evaluated patient he was stable and comfortable so transitioned back to his home nasal cannula oxygen.  Significant findings included: Essentially pan CT scanned in the ED with several findings:   -Possible left lower lobe pneumonia on chest CT.   -Large left groin hernia without obstruction or incarceration. -Abnormal findings in the pancreas on CT scan were concerning for mass but not fully evaluated so radiology recommended abdominal MRI to further evaluate. -Also has mass on lower pole of right kidney which is consistent with renal cell carcinoma and can also be further evaluated with the MRI. -Additionally, multiple nodules were seen in the right lower lobe of his lungs which is recommended to have noncontrast chest CT follow-up in 3 to 6 months.   They were initially treated with Mucinex , DuoNeb, Solu-Medrol , ceftriaxone , doxycycline .    Patient was admitted to medicine service for further workup and management of hypoxia as outlined in detail below.   Assessment &  Plan:   Principal Problem:   COPD exacerbation (HCC) Active Problems:   Left inguinal hernia Left lower lobe pneumonia as seen on chest CT  hypoxia- due to his home oxygen running out. He is now currently at 100% SpO2 on 2L nasal cannula which is his home oxygen requirement.  No wheezing on exam, no productive cough or rhinorrhea reported. Plan: Less likely COPD exacerbation.  Can stop COPD pathway.  Continue antibiotic therapy for community-acquired pneumonia.  Add Solu-Medrol  40 mg IV daily.  Continue incentive spirometer.  Continue therapy evaluations    Mild hyperkalemia Monitor and replace as necessary   Inguinal hernia-evaluated by general surgery and is not urgently surgical.  Patient denies any current discomfort. No incarceration nor obstruction on CT.  He can follow-up outpatient with surgery if he and family decide to pursue this - appreciate surgery recs - monitor bowel movements    Mass of pancreas, right kidney (consistent with RCC)-suspicious of malignancy and seen incidentally on abdominal CT scan.  Radiology recommendations include follow-up MRI to further characterize. MRI performed.  Moderate pancreatic duct dilatation with small mass in the pancreas.  Radiology report recommends tissue sampling. Plan: Palliative care engaged.  At this time patient is undecided whether he wants to pursue aggressive treatments.  At this time we will defer diagnostic evaluation.  Check CA 19-9.  If this is markedly elevated that raises suspicion for pancreatic adenocarcinoma.  Given current functional status and poor health literacy patient may not be an ideal treatment candidate however  will await further laboratory vesication and ongoing palliative care discussion to determine appropriate plan of care    COPD-  - continue PRN breathing treatments   HTN- hold home meds since he has lower-normal BPs   Seizure disorder - continue home keppra    DVT prophylaxis: SQL Code Status:  Full Family Communication: None Disposition Plan: Status is: Inpatient Remains inpatient appropriate because: Multiple issues as above   Level of care: Progressive  Consultants:  Palliative care  Procedures:  None  Antimicrobials: Rocephin    Subjective: Seen and examined.  Seen in bed.  Reports symptomatic improvement since admission.  Objective: Vitals:   04/03/24 2236 04/04/24 1152 04/04/24 1205 04/04/24 1225  BP: 126/86 122/83 (!) 141/114   Pulse: 67 68 (!) 46   Resp: 20 18 20    Temp:  98 F (36.7 C) 97.7 F (36.5 C)   TempSrc:  Oral Oral   SpO2: 100% 100% 99% (!) 78%  Weight: 53.4 kg     Height: 5' 7 (1.702 m)       Intake/Output Summary (Last 24 hours) at 04/04/2024 1444 Last data filed at 04/04/2024 1403 Gross per 24 hour  Intake 220 ml  Output 500 ml  Net -280 ml   Filed Weights   04/03/24 0341 04/03/24 2236  Weight: 51.7 kg 53.4 kg    Examination:  General exam: NAD.  Appears frail and chronically ill Respiratory system: Crackles bilaterally.  Normal work of breathing.  Liters Cardiovascular system: 1 S2, RRR, no murmurs, no pedal edema Gastrointestinal system: Thin, soft, NT/ND, normal bowel sounds Central nervous system: Alert and oriented. No focal neurological deficits. Extremities: Symmetric 5 x 5 power. Skin: No rashes, lesions or ulcers Psychiatry: Judgement and insight appear normal. Mood & affect appropriate.     Data Reviewed: I have personally reviewed following labs and imaging studies  CBC: Recent Labs  Lab 04/03/24 0358 04/04/24 0447  WBC 11.7* 7.6  NEUTROABS 6.3  --   HGB 7.9* 6.5*  HCT 28.0* 22.1*  MCV 97.2 93.6  PLT 180 169   Basic Metabolic Panel: Recent Labs  Lab 04/03/24 0359 04/04/24 0447  NA 145 145  K 5.2* 5.1  CL 107 110  CO2 27 29  GLUCOSE 135* 99  BUN 47* 55*  CREATININE 2.77* 2.53*  CALCIUM  8.7* 8.5*   GFR: Estimated Creatinine Clearance: 22.6 mL/min (A) (by C-G formula based on SCr of 2.53  mg/dL (H)). Liver Function Tests: Recent Labs  Lab 04/03/24 0359 04/04/24 0447  AST 17 11*  ALT 7 6  ALKPHOS 62 47  BILITOT 0.4 0.3  PROT 9.3* 7.8  ALBUMIN 3.9 3.2*   Recent Labs  Lab 04/03/24 0625  LIPASE 166*   No results for input(s): AMMONIA in the last 168 hours. Coagulation Profile: Recent Labs  Lab 04/03/24 0358  INR 1.1   Cardiac Enzymes: Recent Labs  Lab 04/03/24 0359  CKTOTAL 76   BNP (last 3 results) Recent Labs    04/03/24 0359  PROBNP 3,058.0*   HbA1C: No results for input(s): HGBA1C in the last 72 hours. CBG: No results for input(s): GLUCAP in the last 168 hours. Lipid Profile: No results for input(s): CHOL, HDL, LDLCALC, TRIG, CHOLHDL, LDLDIRECT in the last 72 hours. Thyroid  Function Tests: Recent Labs    04/03/24 0358  TSH 0.843  FREET4 1.12   Anemia Panel: No results for input(s): VITAMINB12, FOLATE, FERRITIN, TIBC, IRON, RETICCTPCT in the last 72 hours. Sepsis Labs: Recent Labs  Lab 04/03/24 [phone]  LATICACIDVEN 0.6    Recent Results (from the past 240 hours)  Resp panel by RT-PCR (RSV, Flu A&B, Covid) Anterior Nasal Swab     Status: None   Collection Time: 04/03/24  3:59 AM   Specimen: Anterior Nasal Swab  Result Value Ref Range Status   SARS Coronavirus 2 by RT PCR NEGATIVE NEGATIVE Final    Comment: (NOTE) SARS-CoV-2 target nucleic acids are NOT DETECTED.  The SARS-CoV-2 RNA is generally detectable in upper respiratory specimens during the acute phase of infection. The lowest concentration of SARS-CoV-2 viral copies this assay can detect is 138 copies/mL. A negative result does not preclude SARS-Cov-2 infection and should not be used as the sole basis for treatment or other patient management decisions. A negative result may occur with  improper specimen collection/handling, submission of specimen other than nasopharyngeal swab, presence of viral mutation(s) within the areas targeted by this  assay, and inadequate number of viral copies(<138 copies/mL). A negative result must be combined with clinical observations, patient history, and epidemiological information. The expected result is Negative.  Fact Sheet for Patients:  bloggercourse.com  Fact Sheet for Healthcare Providers:  seriousbroker.it  This test is no t yet approved or cleared by the [place]  FDA and  has been authorized for detection and/or diagnosis of SARS-CoV-2 by FDA under an Emergency Use Authorization (EUA). This EUA will remain  in effect (meaning this test can be used) for the duration of the COVID-19 declaration under Section 564(b)(1) of the Act, 21 U.S.C.section 360bbb-3(b)(1), unless the authorization is terminated  or revoked sooner.       Influenza A by PCR NEGATIVE NEGATIVE Final   Influenza B by PCR NEGATIVE NEGATIVE Final    Comment: (NOTE) The Xpert Xpress SARS-CoV-2/FLU/RSV plus assay is intended as an aid in the diagnosis of influenza from Nasopharyngeal swab specimens and should not be used as a sole basis for treatment. Nasal washings and aspirates are unacceptable for Xpert Xpress SARS-CoV-2/FLU/RSV testing.  Fact Sheet for Patients: bloggercourse.com  Fact Sheet for Healthcare Providers: seriousbroker.it  This test is not yet approved or cleared by the [place]  FDA and has been authorized for detection and/or diagnosis of SARS-CoV-2 by FDA under an Emergency Use Authorization (EUA). This EUA will remain in effect (meaning this test can be used) for the duration of the COVID-19 declaration under Section 564(b)(1) of the Act, 21 U.S.C. section 360bbb-3(b)(1), unless the authorization is terminated or revoked.     Resp Syncytial Virus by PCR NEGATIVE NEGATIVE Final    Comment: (NOTE) Fact Sheet for Patients: bloggercourse.com  Fact Sheet for  Healthcare Providers: seriousbroker.it  This test is not yet approved or cleared by the [place]  FDA and has been authorized for detection and/or diagnosis of SARS-CoV-2 by FDA under an Emergency Use Authorization (EUA). This EUA will remain in effect (meaning this test can be used) for the duration of the COVID-19 declaration under Section 564(b)(1) of the Act, 21 U.S.C. section 360bbb-3(b)(1), unless the authorization is terminated or revoked.  Performed at [facility], [address] [place], [place] [address]          Radiology Studies: MR ABDOMEN W WO CONTRAST Result Date: 04/04/2024 CLINICAL DATA:  Pancreatic ductal dilatation and possible pancreatic mass. Indeterminate right renal lesion. EXAM: MRI ABDOMEN WITHOUT AND WITH CONTRAST TECHNIQUE: Multiplanar multisequence MR imaging of the abdomen was performed both before and after the administration of intravenous contrast. CONTRAST:  5mL GADAVIST  GADOBUTROL  1 MMOL/ML IV SOLN COMPARISON:  CT on 04/03/2024 FINDINGS: Lower chest: Left lower lobe airspace disease, suspicious for pneumonia. Hepatobiliary: Image degradation by motion artifact noted. No hepatic masses identified. Gallbladder is unremarkable. No evidence of biliary ductal dilatation. Pancreas: Image degradation by motion artifact noted. Moderate dilatation of the main pancreatic duct is seen in the body and tail. Stricture of the pancreatic duct is seen within the body, with subtle soft tissue prominence measuring 1.9 x 1.1 cm, best visualized on image 43/19. This is suspicious for a small pancreatic mass. Spleen:  Within normal limits in size and appearance. Adrenals/Urinary Tract: Image degradation by motion artifact noted, particularly the dynamic postcontrast sequences. Tiny benign-appearing Bosniak category 1 and 2 renal cysts are seen bilaterally. A complex subcapsular lesion is seen in the lateral lower pole of the right kidney  which shows heterogeneous T1 hyperintensity and T2 hypointensity, and measures 2.6 x 2.5 cm. Although limited, no definite contrast enhancement of this lesion is seen on subtraction imaging. This is likely a complex hemorrhagic cystic lesion, and considered a Bosniak category 2 F lesion. No evidence of hydronephrosis. Stomach/Bowel: Unremarkable. Vascular/Lymphatic: No pathologically enlarged lymph nodes identified. No acute vascular findings. Other:  None. Musculoskeletal:  No suspicious bone lesions identified. IMPRESSION: Motion degraded exam. Moderate dilatation of the main pancreatic duct, with stricture in the pancreatic body and subtle soft tissue prominence suspicious for a small pancreatic mass. Recommend GI consultation for consideration of EUS/FNA. 2.6 cm complex subcapsular lesion in the right kidney is considered an indeterminate Bosniak category 2 F lesion. Recommend continued follow-up by renal protocol abdomen CT without and with contrast in 6 months. Left lower lobe airspace disease, suspicious for pneumonia. Electronically Signed   By: Norleen DELENA Kil M.D.   On: 04/04/2024 08:46   MR 3D Recon At Scanner Result Date: 04/04/2024 CLINICAL DATA:  Pancreatic ductal dilatation and possible pancreatic mass. Indeterminate right renal lesion. EXAM: MRI ABDOMEN WITHOUT AND WITH CONTRAST TECHNIQUE: Multiplanar multisequence MR imaging of the abdomen was performed both before and after the administration of intravenous contrast. CONTRAST:  5mL GADAVIST  GADOBUTROL  1 MMOL/ML IV SOLN COMPARISON:  CT on 04/03/2024 FINDINGS: Lower chest: Left lower lobe airspace disease, suspicious for pneumonia. Hepatobiliary: Image degradation by motion artifact noted. No hepatic masses identified. Gallbladder is unremarkable. No evidence of biliary ductal dilatation. Pancreas: Image degradation by motion artifact noted. Moderate dilatation of the main pancreatic duct is seen in the body and tail. Stricture of the pancreatic  duct is seen within the body, with subtle soft tissue prominence measuring 1.9 x 1.1 cm, best visualized on image 43/19. This is suspicious for a small pancreatic mass. Spleen:  Within normal limits in size and appearance. Adrenals/Urinary Tract: Image degradation by motion artifact noted, particularly the dynamic postcontrast sequences. Tiny benign-appearing Bosniak category 1 and 2 renal cysts are seen bilaterally. A complex subcapsular lesion is seen in the lateral lower pole of the right kidney which shows heterogeneous T1 hyperintensity and T2 hypointensity, and measures 2.6 x 2.5 cm. Although limited, no definite contrast enhancement of this lesion is seen on subtraction imaging. This is likely a complex hemorrhagic cystic lesion, and considered a Bosniak category 2 F lesion. No evidence of hydronephrosis. Stomach/Bowel: Unremarkable. Vascular/Lymphatic: No pathologically enlarged lymph nodes identified. No acute vascular findings. Other:  None. Musculoskeletal:  No suspicious bone lesions identified. IMPRESSION: Motion degraded exam. Moderate dilatation of the main pancreatic duct, with stricture in the pancreatic body and subtle soft tissue prominence suspicious for a small pancreatic mass. Recommend GI consultation  for consideration of EUS/FNA. 2.6 cm complex subcapsular lesion in the right kidney is considered an indeterminate Bosniak category 2 F lesion. Recommend continued follow-up by renal protocol abdomen CT without and with contrast in 6 months. Left lower lobe airspace disease, suspicious for pneumonia. Electronically Signed   By: Norleen DELENA Kil M.D.   On: 04/04/2024 08:46   CT HEAD WO CONTRAST ( ) Result Date: 04/03/2024 EXAM: CT HEAD AND CERVICAL SPINE 04/03/2024 04:32:45 AM TECHNIQUE: CT of the head and cervical spine was performed without the administration of intravenous contrast. Multiplanar reformatted images are provided for review. Automated exposure control, iterative reconstruction,  and/or weight based adjustment of the mA/kV was utilized to reduce the radiation dose to as low as reasonably achievable. COMPARISON: Head CT 09/09/2020. No prior cervical spine imaging. CLINICAL HISTORY: Head trauma, abnormal mental status (Age 32-64y) FINDINGS: CT HEAD BRAIN AND VENTRICLES: No acute intracranial hemorrhage. No mass effect or midline shift. No abnormal extra-axial fluid collection. There is encephalomalacia again noted due to a chronic right MCA infarct, centered in the right gangliocapsular area with extension into the posterior right frontal and anterior right parietal lobes and insula. Ex vacuo prominence of the right lateral ventricle is again noted as well. There is atrophy with atrophic ventriculomegaly and moderate to severe small vessel disease, which is advanced for age. There is a chronic lacunar infarct in the left basal ganglia. Asymmetric atrophy in the left cerebellar hemisphere. No cortical-based acute infarct. No hydrocephalus. There are patchy calcifications in both siphons. There are no hyperdense vessels. ORBITS: No acute abnormality. SINUSES AND MASTOIDS: There is chronic fluid in the right mastoid tip. There is increased left maxillary sinus membrane thickening with trace intracavitary fluid. The other paranasal sinuses and left mastoid air cells are clear. SOFT TISSUES AND SKULL: No acute skull fracture. No acute soft tissue abnormality. CT CERVICAL SPINE LIMITATIONS: Imaging of the cervical spine is limited due to patient motion. BONES AND ALIGNMENT: There is a mild cervical kypholevoscoliosis, centered at C3-4. There is a 2 to 3 mm grade 1 retrolisthesis at C3-C4 and C5-C6, both believed to be related to degenerative disc disease. No traumatic malalignment is seen. No widening of the anterior atlantoaxial joint. No acute cervical fracture is seen through the motion artifacts. DEGENERATIVE CHANGES: There is disc collapse with a chronic appearance and mild endplate  irregularities at C3-4 and C5-6, mild disc space loss at C4-C5 and moderate disc space loss at C6-C7. The disc spaces at C2-3, C7-T1 are normal in height. There is mild facet joint hypertrophy at the upper cervical levels, with mild left foraminal stenosis at C3-4 and C4-5. All other foramina are clear. There are anterior and posterior osteophytes from C3-4 through C6-7. There are posterior osteophytes encroaching on the ventral cord surface at C3-4, C4-5 and C5-6, with mild spinal stenosis at these levels. Other levels do not show significant encroachment on the thecal sac allowing for motion artifact. VASCULATURE: Both Proximal cervical ICAs are heavily calcified. SOFT TISSUES: There are stones in both palatine tonsils. No laryngeal mass is seen. No prevertebral soft tissue swelling. IMPRESSION: 1. No acute intracranial CT findings or depressed skull fractures. 2. Encephalomalacia due to a chronic right MCA infarct with ex vacuo prominence of the right lateral ventricle. 3. Atrophy with atrophic ventriculomegaly and moderate to severe small vessel disease, advanced for age. 4. Chronic lacunar infarct in the left basal ganglia. 5. Asymmetric atrophy in the left cerebellar hemisphere. 6. Mild cervical kypholevoscoliosis centered at C3-4. 7. 2  to 3 mm grade 1 retrolisthesis at C3-C4 and C5-C6, likely related to degenerative disc disease. 8. Disc collapse with a chronic appearance and mild endplate irregularities at C3-4 and C5-6, mild disc space loss at C4-C5, and moderate disc space loss at C6-C7. Spondylosis. 9. Mild left foraminal stenosis at C3-4 and C4-5. All other foramina are clear. 10. Posterior osteophytes encroaching on the ventral cord surface at C3-4, C4-5, and C5-6, with mild spinal stenosis at these levels. Other levels do not show significant encroachment on the thecal sac, allowing for motion artifact. Electronically signed by: Francis Quam MD 04/03/2024 05:09 AM EST RP Workstation: [id number]   CT  Cervical Spine Wo Contrast Result Date: 04/03/2024 EXAM: CT HEAD AND CERVICAL SPINE 04/03/2024 04:32:45 AM TECHNIQUE: CT of the head and cervical spine was performed without the administration of intravenous contrast. Multiplanar reformatted images are provided for review. Automated exposure control, iterative reconstruction, and/or weight based adjustment of the mA/kV was utilized to reduce the radiation dose to as low as reasonably achievable. COMPARISON: Head CT 09/09/2020. No prior cervical spine imaging. CLINICAL HISTORY: Head trauma, abnormal mental status (Age 28-64y) FINDINGS: CT HEAD BRAIN AND VENTRICLES: No acute intracranial hemorrhage. No mass effect or midline shift. No abnormal extra-axial fluid collection. There is encephalomalacia again noted due to a chronic right MCA infarct, centered in the right gangliocapsular area with extension into the posterior right frontal and anterior right parietal lobes and insula. Ex vacuo prominence of the right lateral ventricle is again noted as well. There is atrophy with atrophic ventriculomegaly and moderate to severe small vessel disease, which is advanced for age. There is a chronic lacunar infarct in the left basal ganglia. Asymmetric atrophy in the left cerebellar hemisphere. No cortical-based acute infarct. No hydrocephalus. There are patchy calcifications in both siphons. There are no hyperdense vessels. ORBITS: No acute abnormality. SINUSES AND MASTOIDS: There is chronic fluid in the right mastoid tip. There is increased left maxillary sinus membrane thickening with trace intracavitary fluid. The other paranasal sinuses and left mastoid air cells are clear. SOFT TISSUES AND SKULL: No acute skull fracture. No acute soft tissue abnormality. CT CERVICAL SPINE LIMITATIONS: Imaging of the cervical spine is limited due to patient motion. BONES AND ALIGNMENT: There is a mild cervical kypholevoscoliosis, centered at C3-4. There is a 2 to 3 mm grade 1  retrolisthesis at C3-C4 and C5-C6, both believed to be related to degenerative disc disease. No traumatic malalignment is seen. No widening of the anterior atlantoaxial joint. No acute cervical fracture is seen through the motion artifacts. DEGENERATIVE CHANGES: There is disc collapse with a chronic appearance and mild endplate irregularities at C3-4 and C5-6, mild disc space loss at C4-C5 and moderate disc space loss at C6-C7. The disc spaces at C2-3, C7-T1 are normal in height. There is mild facet joint hypertrophy at the upper cervical levels, with mild left foraminal stenosis at C3-4 and C4-5. All other foramina are clear. There are anterior and posterior osteophytes from C3-4 through C6-7. There are posterior osteophytes encroaching on the ventral cord surface at C3-4, C4-5 and C5-6, with mild spinal stenosis at these levels. Other levels do not show significant encroachment on the thecal sac allowing for motion artifact. VASCULATURE: Both Proximal cervical ICAs are heavily calcified. SOFT TISSUES: There are stones in both palatine tonsils. No laryngeal mass is seen. No prevertebral soft tissue swelling. IMPRESSION: 1. No acute intracranial CT findings or depressed skull fractures. 2. Encephalomalacia due to a chronic right MCA infarct with  ex vacuo prominence of the right lateral ventricle. 3. Atrophy with atrophic ventriculomegaly and moderate to severe small vessel disease, advanced for age. 4. Chronic lacunar infarct in the left basal ganglia. 5. Asymmetric atrophy in the left cerebellar hemisphere. 6. Mild cervical kypholevoscoliosis centered at C3-4. 7. 2 to 3 mm grade 1 retrolisthesis at C3-C4 and C5-C6, likely related to degenerative disc disease. 8. Disc collapse with a chronic appearance and mild endplate irregularities at C3-4 and C5-6, mild disc space loss at C4-C5, and moderate disc space loss at C6-C7. Spondylosis. 9. Mild left foraminal stenosis at C3-4 and C4-5. All other foramina are clear. 10.  Posterior osteophytes encroaching on the ventral cord surface at C3-4, C4-5, and C5-6, with mild spinal stenosis at these levels. Other levels do not show significant encroachment on the thecal sac, allowing for motion artifact. Electronically signed by: Francis Quam MD 04/03/2024 05:09 AM EST RP Workstation: [id number]   CT CHEST ABDOMEN PELVIS WO CONTRAST Result Date: 04/03/2024 CLINICAL DATA:  Increasing weakness. EXAM: CT CHEST, ABDOMEN AND PELVIS WITHOUT CONTRAST TECHNIQUE: Multidetector CT imaging of the chest, abdomen and pelvis was performed following the standard protocol without IV contrast. RADIATION DOSE REDUCTION: This exam was performed according to the departmental dose-optimization program which includes automated exposure control, adjustment of the mA and/or kV according to patient size and/or use of iterative reconstruction technique. COMPARISON:  Chest CT 08/20/2020 FINDINGS: CT CHEST FINDINGS Cardiovascular: Heart size upper normal. No substantial pericardial effusion. Coronary artery calcification is evident. Mild atherosclerotic calcification is noted in the wall of the thoracic aorta. Ascending thoracic aorta measures up to 4.2 cm diameter. Mediastinum/Nodes: No mediastinal lymphadenopathy. No evidence for gross hilar lymphadenopathy although assessment is limited by the lack of intravenous contrast on the current study. The esophagus has normal imaging features. Axillary lymphadenopathy noted bilaterally. Left axillary index lymph node measures 17 mm short axis on 20/2. Lungs/Pleura: Centrilobular and paraseptal emphysema evident. 8 mm right middle lobe nodule on 134/4. 6 mm right lower lobe nodule on 132/4. Consolidative airspace disease in the left lower lobe. No substantial pleural effusion. Musculoskeletal: No worrisome lytic or sclerotic osseous abnormality. CT ABDOMEN PELVIS FINDINGS Hepatobiliary: No suspicious focal abnormality in the liver on this study without intravenous  contrast. Gallbladder not well seen likely due to nondistention. No intra or extrahepatic biliary duct dilatation. Pancreas: There is marked diffuse dilatation of the main pancreatic duct measuring up to 7 mm diameter in the pancreatic tail region. Pancreatic duct becomes obscured near the junction of the body and tail (image 81/2) and again appears distended through the pancreatic head region. Imaging of the pancreas is limited by lack of intravenous contrast material. Spleen: No splenomegaly. No suspicious focal mass lesion. Adrenals/Urinary Tract: No adrenal nodule or mass. 2.5 x 2.6 cm heterogeneous exophytic lesion identified lower pole right kidney, potentially with some mineralization. Left kidney unremarkable. No hydroureter. The urinary bladder appears normal for the degree of distention. Stomach/Bowel: Diffuse gastric wall thickening evident, likely accentuated by underdistention. Duodenum is normally positioned as is the ligament of Treitz. No small bowel wall thickening. No small bowel dilatation. No gross colonic mass. No colonic wall thickening. Moderate stool volume evident. Vascular/Lymphatic: There is advanced atherosclerotic calcification of the abdominal aorta without aneurysm. There is no gastrohepatic or hepatoduodenal ligament lymphadenopathy. 12 mm short axis left para-aortic lymph node evident. Other mildly enlarged retroperitoneal lymph nodes are identified. 13 mm short axis right common iliac node on 106/2. Mild lymphadenopathy is identified in the groin regions.  Reproductive: Prostate gland is enlarged. Other: No substantial intraperitoneal free fluid. Musculoskeletal: Extremely large left groin hernia contains small bowel and colon. There is some congestion in the small bowel mesentery of the herniated segments with probable small volume fluid in the hernia sac. No features to suggest overt bowel obstruction due to the herniated bowel segments. No worrisome lytic or sclerotic osseous  abnormality. IMPRESSION: 1. Consolidative airspace disease in the left lower lobe compatible pneumonia. Imaging follow-up recommended to ensure complete resolution. 2. Extremely large left groin hernia contains small bowel and colon. There is some congestion in the small bowel mesentery of the herniated segments with probable small volume fluid in the hernia sac. No features to suggest overt bowel obstruction due to the herniated bowel segments. Correlation for signs/symptoms of incarceration recommended. 3. Marked diffuse dilatation of the main pancreatic duct measuring up to 7 mm diameter in the pancreatic tail region. Pancreatic duct becomes obscured near the junction of the body and tail without a discernible mass lesion on noncontrast imaging. Main pancreatic duct is also distended through the pancreatic head region. Imaging of the pancreas is limited by lack of intravenous contrast material. MRI of the abdomen with and without contrast recommended to further evaluate. 4. 2.5 x 2.6 cm heterogeneous exophytic lesion lower pole right kidney, potentially with some mineralization. Findings raise concern for renal cell carcinoma. This could also be further assessed at the time of MRI. 5. Axillary, retroperitoneal, and groin lymphadenopathy. Lymphoproliferative disorder not excluded. 6. 8 mm right middle lobe and 6 mm right lower lobe pulmonary nodules. Non-contrast chest CT at 3-6 months is recommended. If the nodules are stable at time of repeat CT, then future CT at 18-24 months (from today's scan) is considered optional for low-risk patients, but is recommended for high-risk patients. This recommendation follows the consensus statement: Guidelines for Management of Incidental Pulmonary Nodules Detected on CT Images: From the Fleischner Society 2017; Radiology 2017; 284:228-243. 7. 4.2 cm ascending thoracic aortic aneurysm. Recommend annual imaging followup by CTA or MRA. This recommendation follows 2010  ACCF/AHA/AATS/ACR/ASA/SCA/SCAI/SIR/STS/SVM Guidelines for the Diagnosis and Management of Patients with Thoracic Aortic Disease. Circulation. 2010; 121: Z733-z630. Aortic aneurysm NOS (ICD10-I71.9) 8. Aortic Atherosclerosis (ICD10-I70.0) and Emphysema (ICD10-J43.9). Electronically Signed   By: Camellia Candle M.D.   On: 04/03/2024 05:02        Scheduled Meds:  enoxaparin  (LOVENOX ) injection  30 mg Subcutaneous Q24H   guaiFENesin   600 mg Oral BID   levETIRAcetam   750 mg Oral BID   methylPREDNISolone  (SOLU-MEDROL ) injection  40 mg Intravenous Daily   umeclidinium bromide   1 puff Inhalation Daily   Continuous Infusions:  [START ON 04/05/2024] cefTRIAXone  (ROCEPHIN )  IV     lactated ringers  75 mL/hr at 04/04/24 1111     LOS: 1 day      Calvin KATHEE Robson, MD [facility]   If 7PM-7AM, please contact night-coverage  04/04/2024, 2:44 PM

## 2024-04-04 NOTE — Telephone Encounter (Signed)
 Pharmacy Patient Advocate Encounter  Insurance verification completed.    The patient is insured through [facility].     Ran test claim for Incruse Ellipta  62.5mcg and the current 30 day co-pay is $4.   This test claim was processed through [employer]- copay amounts may vary at other pharmacies due to [employer], or as the patient moves through the different stages of their insurance plan.

## 2024-04-04 NOTE — Plan of Care (Signed)

## 2024-04-04 NOTE — Progress Notes (Signed)
 OT Cancellation Note  Patient Details Name: Darin Hopkins MRN: [id number] DOB: 1960/07/07   Cancelled Treatment:    Reason Eval/Treat Not Completed: Patient declined, no reason specified;Other (comment) (adamently refused to get out of bed saying 'I just don't feel like it.) OT educated on importance of regular mobility to prevent deconditioning and maximize independence, he continues to refuse   Maryelizabeth CHRISTELLA Clause 04/04/2024, 11:42 AM

## 2024-04-04 NOTE — TOC Initial Note (Signed)
 Transition of Care [facility]) - Initial/Assessment Note    Patient Details  Name: Darin Hopkins MRN: [id number] Date of Birth: [birth year]  Transition of Care [facility]) CM/SW Contact:    Shasta DELENA Daring, RN Phone Number: 04/04/2024, 4:48 PM  Clinical Narrative:                 RNCM discussed rehab options with patient. He declined SNF or Home Health services.  Will follow for additional TOC needs.  Expected Discharge Plan: Home/Self Care     Patient Goals and CMS Choice Patient states their goals for this hospitalization and ongoing recovery are:: Just go back home          Expected Discharge Plan and Services                                              Prior Living Arrangements/Services   Lives with:: Self Patient language and need for interpreter reviewed:: Yes Do you feel safe going back to the place where you live?: Yes      Need for Family Participation in Patient Care: Yes (Comment) Care giver support system in place?: Yes (comment)   Criminal Activity/Legal Involvement Pertinent to Current Situation/Hospitalization: No - Comment as needed  Activities of Daily Living   ADL Screening (condition at time of admission) Independently performs ADLs?: Yes (appropriate for developmental age) Is the patient deaf or have difficulty hearing?: No Does the patient have difficulty seeing, even when wearing glasses/contacts?: No Does the patient have difficulty concentrating, remembering, or making decisions?: No  Permission Sought/Granted                  Emotional Assessment Appearance:: Appears stated age     Orientation: : Oriented to Self Alcohol / Substance Use: Not Applicable    Admission diagnosis:  Hypercapnia [R06.89] Weakness [R53.1] Respiratory distress [R06.03] COPD exacerbation (HCC) [J44.1] Chronic obstructive pulmonary disease, unspecified COPD type (HCC) [J44.9] Patient Active Problem List   Diagnosis Date Noted   Left inguinal hernia  04/03/2024   Chronic respiratory failure with hypoxia and hypercapnia (HCC) 01/16/2021   Acute hypoxemic respiratory failure (HCC) 11/25/2020   AKI (acute kidney injury) 11/25/2020   Status epilepticus (HCC) 09/09/2020   COPD exacerbation (HCC) 08/27/2020   Protein-calorie malnutrition, severe 08/21/2020   Acute hypercapnic respiratory failure (HCC)    Acute decompensated heart failure (HCC) 08/18/2020   Acute exacerbation of CHF (congestive heart failure) (HCC) 08/17/2020   Essential hypertension 08/17/2020   History of seizure 08/17/2020   COPD  GOLD ? spirometry / 02 dep/ hypercarbic  08/17/2020   Hyperlipidemia 08/17/2020   GERD (gastroesophageal reflux disease) 08/17/2020   Alcohol abuse 08/17/2020   Chronic kidney disease, stage III (moderate) (HCC) 10/24/2012   PCP:  [name], NP Pharmacy:   [facility] [place], [place] - [address] [place] [address] Phone: [phone] Fax: [phone]     Social Drivers of Health (SDOH) Social History: SDOH Screenings   Food Insecurity: No Food Insecurity (04/03/2024)  Housing: Low Risk  (04/03/2024)  Transportation Needs: No Transportation Needs (04/03/2024)  Utilities: Not At Risk (04/03/2024)  Depression (PHQ2-9): Low Risk  (02/11/2022)  Tobacco Use: Medium Risk (04/03/2024)   SDOH Interventions:     Readmission Risk Interventions     No data to display

## 2024-04-04 NOTE — Evaluation (Signed)
 Physical Therapy Evaluation Patient Details Name: Darin Hopkins MRN: [id number] DOB: [birth year] Today's Date: 04/04/2024  History of Present Illness  presented to ER secondary to progressive weakness, hypoxia (noted with O2 tank empty); admitted for management of COPD exacerbation.  Clinical Impression  Patient resting in bed upon arrival to room; alert and oriented to basic information, follows simple commands (with increased time for processing, agreeable to participation with session with min encouragement from therapist. Bilat UE/LE strength and ROM grossly symmetrical and WFL; no obviously focal weakness appreciated.  Denies pain; does endorse some improvement in respiratory status since admission.  Able to complete bed mobility with mod indep; sit/stand, basic transfers and gait (35') with RW, cga/min assist.  Demonstrates reciprocal stepping pattern, fair cadence; no buckling or LOB. Does intermittently reliease grasp of RW (to assist reach for/negotiate IV pole). Mod SOB with gait efforts, desat to 78% on 3L with above distance, requiring >75min seated rest and pursed lip breathing for recovery  Would benefit from skilled PT to address above deficits and promote optimal return to PLOF.; recommend post-acute PT follow up as indicated by interdisciplinary care team.  May improve to HHPT as respiratory status improves; will continue to follow and update recs as appropriate.       If plan is discharge home, recommend the following: A little help with walking and/or transfers;A little help with bathing/dressing/bathroom   Can travel by private vehicle   Yes    Equipment Recommendations Rolling walker (2 wheels)  Recommendations for Other Services       Functional Status Assessment Patient has had a recent decline in their functional status and demonstrates the ability to make significant improvements in function in a reasonable and predictable amount of time.     Precautions /  Restrictions Precautions Precautions: Fall Restrictions Weight Bearing Restrictions Per Provider Order: No      Mobility  Bed Mobility Overal bed mobility: Modified Independent                  Transfers Overall transfer level: Needs assistance Equipment used: Rolling walker (2 wheels)   Sit to Stand: Contact guard assist           General transfer comment: does require UE support to complete    Ambulation/Gait Ambulation/Gait assistance: Contact guard assist Gait Distance (Feet): 35 Feet Assistive device: Rolling walker (2 wheels)         General Gait Details: reciprocal stepping pattern, fair cadence; no buckling or LOB.  Does intermittently reliease grasp of RW (to assist reach for/negotiate IV pole).  Mod SOB with gait efforts, desat to 78% on 3L with above distance, requiring >68min seated rest and pursed lip breathing for recovery  Stairs            Wheelchair Mobility     Tilt Bed    Modified Rankin (Stroke Patients Only)       Balance Overall balance assessment: Needs assistance Sitting-balance support: No upper extremity supported, Feet supported Sitting balance-Leahy Scale: Good     Standing balance support: Bilateral upper extremity supported Standing balance-Leahy Scale: Fair                               Pertinent Vitals/Pain Pain Assessment Pain Assessment: No/denies pain    Home Living Family/patient expects to be discharged to:: Private residence Living Arrangements: Other relatives Available Help at Discharge: Family Type of Home: Mobile home Home Access:  Stairs to enter   [employer] of Steps: 1   Home Layout: One level        Prior Function Prior Level of Function : Needs assist             Mobility Comments: Ambulatory for household and limited community distances with SPC vs RW; home O2 at 3L.  Questionable fall history.       Extremity/Trunk Assessment   Upper Extremity  Assessment Upper Extremity Assessment: Generalized weakness    Lower Extremity Assessment Lower Extremity Assessment: Generalized weakness (grossly at least 4-/5 throughout)       Communication        Cognition Arousal: Alert Behavior During Therapy: [facility] for tasks assessed/performed                           PT - Cognition Comments: Oriented to basic information, follows simple commands; mild processing delays; cooperative with session, but does require min encouragement from therapist for full participation   Following commands impaired: Only follows one step commands consistently     Cueing Cueing Techniques: Verbal cues, Tactile cues     General Comments      Exercises     Assessment/Plan    PT Assessment Patient needs continued PT services  PT Problem List Decreased activity tolerance;Decreased balance;Decreased mobility;Decreased knowledge of use of DME;Decreased safety awareness;Decreased knowledge of precautions;Cardiopulmonary status limiting activity       PT Treatment Interventions DME instruction;Gait training;Stair training;Functional mobility training;Therapeutic activities;Therapeutic exercise;Balance training;Patient/family education    PT Goals (Current goals can be found in the Care Plan section)  Acute Rehab PT Goals Patient Stated Goal: to return home PT Goal Formulation: With patient Time For Goal Achievement: 04/18/24 Potential to Achieve Goals: Good    Frequency Min 2X/week     Co-evaluation               AM-PAC PT 6 Clicks Mobility  Outcome Measure Help needed turning from your back to your side while in a flat bed without using bedrails?: None Help needed moving from lying on your back to sitting on the side of a flat bed without using bedrails?: None Help needed moving to and from a bed to a chair (including a wheelchair)?: None Help needed standing up from a chair using your arms (e.g., wheelchair or bedside  chair)?: None Help needed to walk in hospital room?: A Little Help needed climbing 3-5 steps with a railing? : A Little 6 Click Score: 22    End of Session Equipment Utilized During Treatment: Gait belt Activity Tolerance: Patient tolerated treatment well Patient left: in chair;with call bell/phone within reach;with chair alarm set Nurse Communication: Mobility status PT Visit Diagnosis: Muscle weakness (generalized) (M62.81);Difficulty in walking, not elsewhere classified (R26.2)    Time: 8856-8792 PT Time Calculation (min) (ACUTE ONLY): 24 min   Charges:   PT Evaluation $PT Eval Moderate Complexity: 1 Mod   PT General Charges $$ ACUTE PT VISIT: 1 Visit         Oshea Percival H. Delores, PT, DPT, NCS 04/04/24, 12:35 PM [phone]

## 2024-04-05 DIAGNOSIS — J441 Chronic obstructive pulmonary disease with (acute) exacerbation: Secondary | ICD-10-CM | POA: Diagnosis not present

## 2024-04-05 LAB — CBC WITH DIFFERENTIAL/PLATELET
Abs Immature Granulocytes: 0.03 K/uL (ref 0.00–0.07)
Basophils Absolute: 0 K/uL (ref 0.0–0.1)
Basophils Relative: 0 %
Eosinophils Absolute: 0 K/uL (ref 0.0–0.5)
Eosinophils Relative: 0 %
HCT: 25.9 % — ABNORMAL LOW (ref 39.0–52.0)
Hemoglobin: 7.7 g/dL — ABNORMAL LOW (ref 13.0–17.0)
Immature Granulocytes: 0 %
Lymphocytes Relative: 63 %
Lymphs Abs: 7.7 K/uL — ABNORMAL HIGH (ref 0.7–4.0)
MCH: 27.8 pg (ref 26.0–34.0)
MCHC: 29.7 g/dL — ABNORMAL LOW (ref 30.0–36.0)
MCV: 93.5 fL (ref 80.0–100.0)
Monocytes Absolute: 0.6 K/uL (ref 0.1–1.0)
Monocytes Relative: 5 %
Neutro Abs: 3.9 K/uL (ref 1.7–7.7)
Neutrophils Relative %: 32 %
Platelets: 206 K/uL (ref 150–400)
RBC: 2.77 MIL/uL — ABNORMAL LOW (ref 4.22–5.81)
RDW: 12.3 % (ref 11.5–15.5)
Smear Review: NORMAL
WBC: 12.3 K/uL — ABNORMAL HIGH (ref 4.0–10.5)
nRBC: 0 % (ref 0.0–0.2)

## 2024-04-05 LAB — BASIC METABOLIC PANEL WITH GFR
Anion gap: 6 (ref 5–15)
BUN: 52 mg/dL — ABNORMAL HIGH (ref 8–23)
CO2: 30 mmol/L (ref 22–32)
Calcium: 8.7 mg/dL — ABNORMAL LOW (ref 8.9–10.3)
Chloride: 109 mmol/L (ref 98–111)
Creatinine, Ser: 2.23 mg/dL — ABNORMAL HIGH (ref 0.61–1.24)
GFR, Estimated: 32 mL/min — ABNORMAL LOW (ref >60)
Glucose, Bld: 101 mg/dL — ABNORMAL HIGH (ref 70–99)
Potassium: 5 mmol/L (ref 3.5–5.1)
Sodium: 145 mmol/L (ref 135–145)

## 2024-04-05 NOTE — Progress Notes (Signed)
 PROGRESS NOTE    Darin Hopkins  FMW:[id number] DOB: 03-31-1961 DOA: 04/03/2024 PCP: [name], NP    Brief Narrative:  63 y.o. male with a PMH significant for COPD, HTN, left inguinal hernia, GERD, CHF, history of seizure, HLD, alcohol dependence, protein calorie malnutrition, CKD 3.   They presented from home to the ED on 04/03/2024 with concerns for considerable weakness x 2 days.  Per EMS report, patient's O2 saturations on their initial evaluation were in the 50s which appears to be due to the fact that his oxygen tank was empty and chronically is supposed to be on 2 to 3 L.  They also reported significant bedbugs in his home.  He was put on home oxygen with significant improvement.   In the ED, it was found that they had stable vital signs on 2 L nasal cannula.  It appears that patient was put on BiPAP at some point earlier this morning but there is no documented desaturation or reason for this and when I evaluated patient he was stable and comfortable so transitioned back to his home nasal cannula oxygen.  Significant findings included: Essentially pan CT scanned in the ED with several findings:   -Possible left lower lobe pneumonia on chest CT.   -Large left groin hernia without obstruction or incarceration. -Abnormal findings in the pancreas on CT scan were concerning for mass but not fully evaluated so radiology recommended abdominal MRI to further evaluate. -Also has mass on lower pole of right kidney which is consistent with renal cell carcinoma and can also be further evaluated with the MRI. -Additionally, multiple nodules were seen in the right lower lobe of his lungs which is recommended to have noncontrast chest CT follow-up in 3 to 6 months.   They were initially treated with Mucinex , DuoNeb, Solu-Medrol , ceftriaxone , doxycycline .    Patient was admitted to medicine service for further workup and management of hypoxia as outlined in detail below.   Assessment &  Plan:   Principal Problem:   COPD exacerbation (HCC) Active Problems:   Left inguinal hernia Left lower lobe pneumonia as seen on chest CT  hypoxia- due to his home oxygen running out. He is now currently at 100% SpO2 on 2L nasal cannula which is his home oxygen requirement.  No wheezing on exam, no productive cough or rhinorrhea reported. Plan: Less likely COPD exacerbation.  Can stop COPD pathway.  Continue antibiotic therapy for community-acquired pneumonia.  Continue Solu-Medrol  40 mg IV daily.  Continue incentive spirometer.  Continue therapy evaluations.  May benefit from skilled nursing facility placement.    Mild hyperkalemia Monitor and replace as necessary   Inguinal hernia-evaluated by general surgery and is not urgently surgical.  Patient denies any current discomfort. No incarceration nor obstruction on CT.  He can follow-up outpatient with surgery if he and family decide to pursue this - appreciate surgery recs - monitor bowel movements    Mass of pancreas, right kidney (consistent with RCC)-suspicious of malignancy and seen incidentally on abdominal CT scan.  Radiology recommendations include follow-up MRI to further characterize. MRI performed.  Moderate pancreatic duct dilatation with small mass in the pancreas.  Radiology report recommends tissue sampling. Plan: Palliative care engaged.  At this time patient is undecided whether he wants to pursue aggressive treatments.  At this time we will defer diagnostic evaluation.  CA 19-9 ordered and pending.  If this is markedly elevated that raises suspicion for pancreatic adenocarcinoma.  Given current functional status and poor health  literacy patient may not be an ideal treatment candidate however will await further laboratory investigation and ongoing palliative care discussion to determine appropriate plan of care    COPD-  - continue PRN breathing treatments   HTN- hold home meds since he has lower-normal BPs   Seizure  disorder - continue home keppra    DVT prophylaxis: SQL Code Status: Full Family Communication: None Disposition Plan: Status is: Inpatient Remains inpatient appropriate because: Multiple issues as above   Level of care: Med-Surg  Consultants:  Palliative care  Procedures:  None  Antimicrobials: Rocephin    Subjective: Seen and examined.  Lying in bed.  No distress.  No complaints Objective: Vitals:   04/04/24 2342 04/05/24 0500 04/05/24 0738 04/05/24 1328  BP: 125/78 (!) 140/78 (!) 152/92 (!) 161/73  Pulse: (!) 59 60 76 75  Resp: 18 16  16   Temp: 97.8 F (36.6 C) 97.9 F (36.6 C) 98.1 F (36.7 C) 98.3 F (36.8 C)  TempSrc:  Oral Oral Oral  SpO2: 100% 100% 95% 95%  Weight:      Height:        Intake/Output Summary (Last 24 hours) at 04/05/2024 1328 Last data filed at 04/05/2024 1100 Gross per 24 hour  Intake 1005.58 ml  Output 950 ml  Net 55.58 ml   Filed Weights   04/03/24 0341 04/03/24 2236  Weight: 51.7 kg 53.4 kg    Examination:  General exam: No distress.  Frail-appearing Respiratory system: Crackles bilaterally.  Normal work of breathing.  Liters Cardiovascular system: 1 S2, RRR, no murmurs, no pedal edema Gastrointestinal system: Thin, soft, NT/ND, normal bowel sounds Central nervous system: Alert and oriented. No focal neurological deficits. Extremities: Symmetric 5 x 5 power. Skin: No rashes, lesions or ulcers Psychiatry: Judgement and insight appear normal. Mood & affect appropriate.     Data Reviewed: I have personally reviewed following labs and imaging studies  CBC: Recent Labs  Lab 04/03/24 0358 04/04/24 0447 04/05/24 0951  WBC 11.7* 7.6 12.3*  NEUTROABS 6.3  --  3.9  HGB 7.9* 6.5* 7.7*  HCT 28.0* 22.1* 25.9*  MCV 97.2 93.6 93.5  PLT 180 169 206   Basic Metabolic Panel: Recent Labs  Lab 04/03/24 0359 04/04/24 0447 04/05/24 0951  NA 145 145 145  K 5.2* 5.1 5.0  CL 107 110 109  CO2 27 29 30   GLUCOSE 135* 99 101*   BUN 47* 55* 52*  CREATININE 2.77* 2.53* 2.23*  CALCIUM  8.7* 8.5* 8.7*   GFR: Estimated Creatinine Clearance: 25.6 mL/min (A) (by C-G formula based on SCr of 2.23 mg/dL (H)). Liver Function Tests: Recent Labs  Lab 04/03/24 0359 04/04/24 0447  AST 17 11*  ALT 7 6  ALKPHOS 62 47  BILITOT 0.4 0.3  PROT 9.3* 7.8  ALBUMIN 3.9 3.2*   Recent Labs  Lab 04/03/24 0625  LIPASE 166*   No results for input(s): AMMONIA in the last 168 hours. Coagulation Profile: Recent Labs  Lab 04/03/24 0358  INR 1.1   Cardiac Enzymes: Recent Labs  Lab 04/03/24 0359  CKTOTAL 76   BNP (last 3 results) Recent Labs    04/03/24 0359  PROBNP 3,058.0*   HbA1C: No results for input(s): HGBA1C in the last 72 hours. CBG: No results for input(s): GLUCAP in the last 168 hours. Lipid Profile: No results for input(s): CHOL, HDL, LDLCALC, TRIG, CHOLHDL, LDLDIRECT in the last 72 hours. Thyroid  Function Tests: Recent Labs    04/03/24 0358  TSH 0.843  FREET4 1.12  Anemia Panel: No results for input(s): VITAMINB12, FOLATE, FERRITIN, TIBC, IRON, RETICCTPCT in the last 72 hours. Sepsis Labs: Recent Labs  Lab 04/03/24 9185  LATICACIDVEN 0.6    Recent Results (from the past 240 hours)  Resp panel by RT-PCR (RSV, Flu A&B, Covid) Anterior Nasal Swab     Status: None   Collection Time: 04/03/24  3:59 AM   Specimen: Anterior Nasal Swab  Result Value Ref Range Status   SARS Coronavirus 2 by RT PCR NEGATIVE NEGATIVE Final    Comment: (NOTE) SARS-CoV-2 target nucleic acids are NOT DETECTED.  The SARS-CoV-2 RNA is generally detectable in upper respiratory specimens during the acute phase of infection. The lowest concentration of SARS-CoV-2 viral copies this assay can detect is 138 copies/mL. A negative result does not preclude SARS-Cov-2 infection and should not be used as the sole basis for treatment or other patient management decisions. A negative result may occur  with  improper specimen collection/handling, submission of specimen other than nasopharyngeal swab, presence of viral mutation(s) within the areas targeted by this assay, and inadequate number of viral copies(<138 copies/mL). A negative result must be combined with clinical observations, patient history, and epidemiological information. The expected result is Negative.  Fact Sheet for Patients:  bloggercourse.com  Fact Sheet for Healthcare Providers:  seriousbroker.it  This test is no t yet approved or cleared by the [place]  FDA and  has been authorized for detection and/or diagnosis of SARS-CoV-2 by FDA under an Emergency Use Authorization (EUA). This EUA will remain  in effect (meaning this test can be used) for the duration of the COVID-19 declaration under Section 564(b)(1) of the Act, 21 U.S.C.section 360bbb-3(b)(1), unless the authorization is terminated  or revoked sooner.       Influenza A by PCR NEGATIVE NEGATIVE Final   Influenza B by PCR NEGATIVE NEGATIVE Final    Comment: (NOTE) The Xpert Xpress SARS-CoV-2/FLU/RSV plus assay is intended as an aid in the diagnosis of influenza from Nasopharyngeal swab specimens and should not be used as a sole basis for treatment. Nasal washings and aspirates are unacceptable for Xpert Xpress SARS-CoV-2/FLU/RSV testing.  Fact Sheet for Patients: bloggercourse.com  Fact Sheet for Healthcare Providers: seriousbroker.it  This test is not yet approved or cleared by the [place]  FDA and has been authorized for detection and/or diagnosis of SARS-CoV-2 by FDA under an Emergency Use Authorization (EUA). This EUA will remain in effect (meaning this test can be used) for the duration of the COVID-19 declaration under Section 564(b)(1) of the Act, 21 U.S.C. section 360bbb-3(b)(1), unless the authorization is terminated  or revoked.     Resp Syncytial Virus by PCR NEGATIVE NEGATIVE Final    Comment: (NOTE) Fact Sheet for Patients: bloggercourse.com  Fact Sheet for Healthcare Providers: seriousbroker.it  This test is not yet approved or cleared by the [place]  FDA and has been authorized for detection and/or diagnosis of SARS-CoV-2 by FDA under an Emergency Use Authorization (EUA). This EUA will remain in effect (meaning this test can be used) for the duration of the COVID-19 declaration under Section 564(b)(1) of the Act, 21 U.S.C. section 360bbb-3(b)(1), unless the authorization is terminated or revoked.  Performed at [facility], [address], [place], [place] [address]          Radiology Studies: MR ABDOMEN W WO CONTRAST Result Date: 04/04/2024 CLINICAL DATA:  Pancreatic ductal dilatation and possible pancreatic mass. Indeterminate right renal lesion. EXAM: MRI ABDOMEN WITHOUT AND WITH CONTRAST TECHNIQUE: Multiplanar multisequence  MR imaging of the abdomen was performed both before and after the administration of intravenous contrast. CONTRAST:  5mL GADAVIST  GADOBUTROL  1 MMOL/ML IV SOLN COMPARISON:  CT on 04/03/2024 FINDINGS: Lower chest: Left lower lobe airspace disease, suspicious for pneumonia. Hepatobiliary: Image degradation by motion artifact noted. No hepatic masses identified. Gallbladder is unremarkable. No evidence of biliary ductal dilatation. Pancreas: Image degradation by motion artifact noted. Moderate dilatation of the main pancreatic duct is seen in the body and tail. Stricture of the pancreatic duct is seen within the body, with subtle soft tissue prominence measuring 1.9 x 1.1 cm, best visualized on image 43/19. This is suspicious for a small pancreatic mass. Spleen:  Within normal limits in size and appearance. Adrenals/Urinary Tract: Image degradation by motion artifact noted, particularly the dynamic  postcontrast sequences. Tiny benign-appearing Bosniak category 1 and 2 renal cysts are seen bilaterally. A complex subcapsular lesion is seen in the lateral lower pole of the right kidney which shows heterogeneous T1 hyperintensity and T2 hypointensity, and measures 2.6 x 2.5 cm. Although limited, no definite contrast enhancement of this lesion is seen on subtraction imaging. This is likely a complex hemorrhagic cystic lesion, and considered a Bosniak category 2 F lesion. No evidence of hydronephrosis. Stomach/Bowel: Unremarkable. Vascular/Lymphatic: No pathologically enlarged lymph nodes identified. No acute vascular findings. Other:  None. Musculoskeletal:  No suspicious bone lesions identified. IMPRESSION: Motion degraded exam. Moderate dilatation of the main pancreatic duct, with stricture in the pancreatic body and subtle soft tissue prominence suspicious for a small pancreatic mass. Recommend GI consultation for consideration of EUS/FNA. 2.6 cm complex subcapsular lesion in the right kidney is considered an indeterminate Bosniak category 2 F lesion. Recommend continued follow-up by renal protocol abdomen CT without and with contrast in 6 months. Left lower lobe airspace disease, suspicious for pneumonia. Electronically Signed   By: Norleen DELENA Kil M.D.   On: 04/04/2024 08:46   MR 3D Recon At Scanner Result Date: 04/04/2024 CLINICAL DATA:  Pancreatic ductal dilatation and possible pancreatic mass. Indeterminate right renal lesion. EXAM: MRI ABDOMEN WITHOUT AND WITH CONTRAST TECHNIQUE: Multiplanar multisequence MR imaging of the abdomen was performed both before and after the administration of intravenous contrast. CONTRAST:  5mL GADAVIST  GADOBUTROL  1 MMOL/ML IV SOLN COMPARISON:  CT on 04/03/2024 FINDINGS: Lower chest: Left lower lobe airspace disease, suspicious for pneumonia. Hepatobiliary: Image degradation by motion artifact noted. No hepatic masses identified. Gallbladder is unremarkable. No evidence of  biliary ductal dilatation. Pancreas: Image degradation by motion artifact noted. Moderate dilatation of the main pancreatic duct is seen in the body and tail. Stricture of the pancreatic duct is seen within the body, with subtle soft tissue prominence measuring 1.9 x 1.1 cm, best visualized on image 43/19. This is suspicious for a small pancreatic mass. Spleen:  Within normal limits in size and appearance. Adrenals/Urinary Tract: Image degradation by motion artifact noted, particularly the dynamic postcontrast sequences. Tiny benign-appearing Bosniak category 1 and 2 renal cysts are seen bilaterally. A complex subcapsular lesion is seen in the lateral lower pole of the right kidney which shows heterogeneous T1 hyperintensity and T2 hypointensity, and measures 2.6 x 2.5 cm. Although limited, no definite contrast enhancement of this lesion is seen on subtraction imaging. This is likely a complex hemorrhagic cystic lesion, and considered a Bosniak category 2 F lesion. No evidence of hydronephrosis. Stomach/Bowel: Unremarkable. Vascular/Lymphatic: No pathologically enlarged lymph nodes identified. No acute vascular findings. Other:  None. Musculoskeletal:  No suspicious bone lesions identified. IMPRESSION: Motion degraded exam.  Moderate dilatation of the main pancreatic duct, with stricture in the pancreatic body and subtle soft tissue prominence suspicious for a small pancreatic mass. Recommend GI consultation for consideration of EUS/FNA. 2.6 cm complex subcapsular lesion in the right kidney is considered an indeterminate Bosniak category 2 F lesion. Recommend continued follow-up by renal protocol abdomen CT without and with contrast in 6 months. Left lower lobe airspace disease, suspicious for pneumonia. Electronically Signed   By: Norleen DELENA Kil M.D.   On: 04/04/2024 08:46        Scheduled Meds:  enoxaparin  (LOVENOX ) injection  30 mg Subcutaneous Q24H   guaiFENesin   600 mg Oral BID   levETIRAcetam   750 mg  Oral BID   methylPREDNISolone  (SOLU-MEDROL ) injection  40 mg Intravenous Daily   umeclidinium bromide   1 puff Inhalation Daily   Continuous Infusions:  cefTRIAXone  (ROCEPHIN )  IV 2 g (04/05/24 0549)   lactated ringers  75 mL/hr at 04/05/24 0044     LOS: 2 days      Calvin KATHEE Robson, MD [facility]   If 7PM-7AM, please contact night-coverage  04/05/2024, 1:28 PM

## 2024-04-05 NOTE — Plan of Care (Signed)

## 2024-04-06 DIAGNOSIS — Z515 Encounter for palliative care: Secondary | ICD-10-CM | POA: Diagnosis not present

## 2024-04-06 DIAGNOSIS — R0689 Other abnormalities of breathing: Secondary | ICD-10-CM | POA: Diagnosis not present

## 2024-04-06 DIAGNOSIS — J449 Chronic obstructive pulmonary disease, unspecified: Secondary | ICD-10-CM | POA: Diagnosis not present

## 2024-04-06 DIAGNOSIS — J441 Chronic obstructive pulmonary disease with (acute) exacerbation: Secondary | ICD-10-CM | POA: Diagnosis not present

## 2024-04-06 LAB — CA 19-9 (SERIAL): CA 19-9: 38 U/mL — ABNORMAL HIGH (ref 0–35)

## 2024-04-06 LAB — PATHOLOGIST SMEAR REVIEW

## 2024-04-06 MED ORDER — PNEUMOCOCCAL 20-VAL CONJ VACC 0.5 ML IM SUSY: 0.5000 mL | PREFILLED_SYRINGE | INTRAMUSCULAR | Status: DC

## 2024-04-06 MED ORDER — INFLUENZA VIRUS VACC SPLIT PF (FLUZONE) 0.5 ML IM SUSY
0.5000 mL | PREFILLED_SYRINGE | INTRAMUSCULAR | Status: DC
  Filled 2024-04-06: qty 0.5

## 2024-04-06 NOTE — Progress Notes (Signed)
 Palliative Care Progress Note, Assessment & Plan   Patient Name: Darin Hopkins       Date: 04/06/2024 DOB: 12-24-1960  Age: 63 y.o. MRN#: [id number] Attending Physician: Jhonny Calvin NOVAK, MD Primary Care Physician: [name], NP Admit Date: 04/03/2024  Subjective: Patient is lying in bed, awake, alert, and oriented.  She acknowledges my presence and is able to make his wishes known.  He has no family or friends present at bedside during my visit.  HPI: 63 y.o. male  with past medical history of COPD, HTN, left hernia, GERD, CHF, history of seizures, HLD, alcohol dependence, CKD stage III, and protein calorie malnutrition admitted on 04/03/2024 with weakness.   As per chart review, EMS reported that initial evaluation found patient's oxygen saturations to be in the 50s.  He is chronically on 2-3 L of nasal cannula but his oxygen tank was found empty.   Additionally, EMS reported significant bedbugs present in his home.   Patient was pan CT scan in the ED with several findings which included:   -Possible left lower lobe pneumonia on chest CT.   -Large left groin hernia without obstruction or incarceration. -Abnormal findings in the pancreas on CT scan were concerning for mass but not fully evaluated so radiology recommended abdominal MRI to further evaluate. -Also has mass on lower pole of right kidney which is consistent with renal cell carcinoma and can also be further evaluated with the MRI. -Additionally, multiple nodules were seen in the right lower lobe of his lungs which is recommended to have noncontrast chest CT follow-up in 3 to 6 months   She is being treated for left lower lobe pneumonia with Mucinex , DuoNeb, Solu-Medrol , ceftriaxone , and doxycycline .  Additionally, general surgery  was consulted and no urgent surgical need at this time.  Recommendation is for patient to follow-up outpatient if he wishes to pursue aggressive treatment of inguinal hernia.   Given mass on pancreas and right kidney, MRI was ordered -pending.   PMT was consulted to support patient and family with discussions on boundaries and goals of medical treatment.   Summary of counseling/coordination of care: Extensive chart review completed prior to meeting patient including labs, vital signs, imaging, progress notes, orders, and available advanced directive documents from current and previous encounters.   After reviewing the patient's chart and assessing the patient at bedside, I spoke with patient in regards to symptom management and goals of care.   Patient endorses he is feeling better.  He is ready and anxious to go home.  Symptom assessment attempted to be completed.  However, patient was focused on whether or not anyone has spoken with his sister.  He shares that we should be calling her.  I advised him that not only myself but other members of the medical team have had multiple attempts at speaking with sister with no answer.  Also advised him we are unable to leave a voicemail.  He is concerned and asked for us  to check on her.  He request that someone go to her home and make sure she is okay.  Discussed placing wellness check for patient's sister.  Advised him I would notify TOC and attending to see  if this is feasible.  Despite multiple attempts to redirect patient towards his current medical situation, he continues to be concerned about the wellbeing of his sister.  I shared that in the event that we are unable to reach his sister the medical team would need to know an alternative for patient's next of kin.  He shares that he does not have any friends, his brother and sister passed away a few years ago, and his sister and her husband are all I have left.  I inquired about his brother-in-law's  name and number.  He cannot recall his brother-in-law's name and does not have his number for him.  He continues to repeat the value to just continue to call the sister.  Highlighted the patient has declined SNF and home health.  When asked to further elaborate on why patient is declining the services, he shares that he is fully capable of taking care of himself at home.  He says that he will do just fine at home.  I shared concern that if patient's sister has provided majority of cares such as providing meals and that we are unable to get in contact with her that patient may be going home to an empty home.  He says that this is fine that he can take care of myself.  Full code and full scope remain.  Patient continues to decline home health and SNF for rehab.  Patient after visiting with patient, I conveyed his concerns about his sister's wellbeing to attending and TOC.  TOC following closely.  Goals are clear though I do not think patient fully acknowledges his poor functional abilities or limits on ability to be independent at this juncture.   PMT will step back to daily visits.  PMT will monitor the patient peripherally and engage where appropriate.  I plan to follow-up with patient after the weekend when I am back to service on Tuesday.  If acute palliative needs arise before that time, please utilize [facility] for on-call palliative providers.  Physical Exam Vitals reviewed.  Constitutional:      Comments: Thin, frail  HENT:     Head:     Comments: Temporal wasting    Mouth/Throat:     Mouth: Mucous membranes are moist.  Eyes:     Pupils: Pupils are equal, round, and reactive to light.  Cardiovascular:     Pulses: Normal pulses.  Pulmonary:     Effort: Pulmonary effort is normal.  Abdominal:     Palpations: Abdomen is soft.  Skin:    General: Skin is warm and dry.  Neurological:     Mental Status: He is alert and oriented to person, place, and time.  Psychiatric:        Mood  and Affect: Mood normal.        Behavior: Behavior normal.        Thought Content: Thought content normal.        Judgment: Judgment normal.             50 minute visit includes: Detailed review of medical records (labs, imaging, vital signs), medically appropriate exam (mental status, respiratory, cardiac, skin), discussed with treatment team, counseling and educating patient, family and staff, documenting clinical information, medication management and coordination of care.  Lamarr L. Arvid, DNP, FNP-BC Palliative Medicine Team

## 2024-04-06 NOTE — Plan of Care (Signed)

## 2024-04-06 NOTE — Plan of Care (Signed)
  Problem: Education: Goal: Knowledge of General Education information will improve Description: Including pain rating scale, medication(s)/side effects and non-pharmacologic comfort measures Outcome: Progressing   Problem: Elimination: Goal: Will not experience complications related to bowel motility Outcome: Progressing Goal: Will not experience complications related to urinary retention Outcome: Progressing   Problem: Pain Managment: Goal: General experience of comfort will improve and/or be controlled Outcome: Progressing   Problem: Safety: Goal: Ability to remain free from injury will improve Outcome: Progressing

## 2024-04-06 NOTE — Progress Notes (Signed)
 Mr. Biggar niece Florencia visited, she provided her phone number and Mr. Mckenzie brother-in-law's phone number for additional family to reach. She requested that the doctor for tomorrow give her a call to update her on Mr. Mudgett progress.

## 2024-04-06 NOTE — Progress Notes (Signed)
 Occupational Therapy Treatment Patient Details Name: Darin Hopkins MRN: [id number] DOB: 23-Jan-1961 Today's Date: 04/06/2024   History of present illness presented to ER secondary to progressive weakness, hypoxia (noted with O2 tank empty); admitted for management of COPD exacerbation.   OT comments  Patient seen for OT treatment on this date. Upon arrival to room patient resting in bed, O2 3L 94%, agreeable to treatment. Patient had been incontinent of urine, he was under the impression he had condom cath on but did not; OT provided urinal and patient continues to have incontinence, difficulty sequencing with how to use urinal. Patient stood at sink on 4L to perform perineal hygiene, began urinating and have BM on floor; BSC set up and patient able to void minimally. Patient walked with r/w to recliner, O2 dropped to 63%, RN in room, increased O2 to 11L and O2 returned to 99% within 2 minutes  Patient ended treatment in recliner with chair alarm on, RN present, and all needs within reach. Patient making good progress toward goals, will continue to follow POC. Discharge recommendation remains appropriate.        If plan is discharge home, recommend the following:  A little help with walking and/or transfers;A little help with bathing/dressing/bathroom;Supervision due to cognitive status   Equipment Recommendations  None recommended by OT    Recommendations for Other Services      Precautions / Restrictions Precautions Precautions: Fall Recall of Precautions/Restrictions: Impaired Restrictions Weight Bearing Restrictions Per Provider Order: No       Mobility Bed Mobility Overal bed mobility: Modified Independent             General bed mobility comments: HOB raised    Transfers Overall transfer level: Needs assistance Equipment used: Rolling walker (2 wheels) Transfers: Sit to/from Stand Sit to Stand: Contact guard assist           General transfer comment: does  require UE support to complete     Balance Overall balance assessment: Needs assistance Sitting-balance support: No upper extremity supported, Feet supported Sitting balance-Leahy Scale: Good     Standing balance support: Bilateral upper extremity supported Standing balance-Leahy Scale: Fair                             ADL either performed or assessed with clinical judgement   ADL Overall ADL's : Needs assistance/impaired     Grooming: Wash/dry hands;Wash/dry face;Supervision/safety;Contact guard assist;Standing       Lower Body Bathing: Moderate assistance       Lower Body Dressing: Set up;Sit to/from stand Lower Body Dressing Details (indicate cue type and reason): doffed/donned socks Toilet Transfer: Contact guard assist;BSC/3in1;Rolling walker (2 wheels)   Toileting- Clothing Manipulation and Hygiene: Minimal assistance Toileting - Clothing Manipulation Details (indicate cue type and reason): A for thorough hygiene            Extremity/Trunk Assessment              Vision       Perception     Praxis     Communication Communication Communication: Impaired Factors Affecting Communication: Hearing impaired   Cognition Arousal: Alert Behavior During Therapy: WFL for tasks assessed/performed Cognition: Cognition impaired     Awareness: Online awareness impaired 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, Clinical functioning impairment (select all impairments): Reasoning                   Following  commands: Impaired Following commands impaired: Only follows one step commands consistently      Cueing   Cueing Techniques: Verbal cues, Tactile cues  Exercises      Shoulder Instructions       General Comments      Pertinent Vitals/ Pain       Pain Assessment Pain Assessment: No/denies pain  Home Living Family/patient expects to be discharged to:: Private residence                                         Prior Functioning/Environment              Frequency  Min 2X/week        Progress Toward Goals  OT Goals(current goals can now be found in the care plan section)  Progress towards OT goals: Progressing toward goals  Acute Rehab OT Goals Patient Stated Goal: to go home OT Goal Formulation: With patient Time For Goal Achievement: 04/17/24 Potential to Achieve Goals: Good ADL Goals Pt Will Perform Grooming: with modified independence;standing Pt Will Perform Lower Body Dressing: with modified independence;sit to/from stand Pt Will Transfer to Toilet: with modified independence;ambulating;regular height toilet Pt Will Perform Toileting - Clothing Manipulation and hygiene: with modified independence;sit to/from stand  Plan      Co-evaluation                 AM-PAC OT 6 Clicks Daily Activity     Outcome Measure   Help from another person eating meals?: None Help from another person taking care of personal grooming?: None Help from another person toileting, which includes using toliet, bedpan, or urinal?: A Little Help from another person bathing (including washing, rinsing, drying)?: A Little Help from another person to put on and taking off regular upper body clothing?: None Help from another person to put on and taking off regular lower body clothing?: A Little 6 Click Score: 21    End of Session Equipment Utilized During Treatment: Oxygen;Rolling walker (2 wheels)  OT Visit Diagnosis: Unsteadiness on feet (R26.81);Muscle weakness (generalized) (M62.81);History of falling (Z91.81);Other abnormalities of gait and mobility (R26.89)   Activity Tolerance Patient tolerated treatment well   Patient Left in chair;with call bell/phone within reach;with nursing/sitter in room;with chair alarm set   Nurse Communication Other (comment) (oxygen requirement)        Time: 9085-9053 OT Time Calculation (min): 32 min  Charges: OT General  Charges $OT Visit: 1 Visit OT Treatments $Self Care/Home Management : 23-37 mins  Rogers Clause, OT/L MSOT, 04/06/2024

## 2024-04-06 NOTE — Progress Notes (Signed)
 PROGRESS NOTE    Darin Hopkins  FMW:[id number] DOB: [birth year] DOA: 04/03/2024 PCP: [name], NP    Brief Narrative:  63 y.o. male with a PMH significant for COPD, HTN, left inguinal hernia, GERD, CHF, history of seizure, HLD, alcohol dependence, protein calorie malnutrition, CKD 3.   They presented from home to the ED on 04/03/2024 with concerns for considerable weakness x 2 days.  Per EMS report, patient's O2 saturations on their initial evaluation were in the 50s which appears to be due to the fact that his oxygen tank was empty and chronically is supposed to be on 2 to 3 L.  They also reported significant bedbugs in his home.  He was put on home oxygen with significant improvement.   In the ED, it was found that they had stable vital signs on 2 L nasal cannula.  It appears that patient was put on BiPAP at some point earlier this morning but there is no documented desaturation or reason for this and when I evaluated patient he was stable and comfortable so transitioned back to his home nasal cannula oxygen.  Significant findings included: Essentially pan CT scanned in the ED with several findings:   -Possible left lower lobe pneumonia on chest CT.   -Large left groin hernia without obstruction or incarceration. -Abnormal findings in the pancreas on CT scan were concerning for mass but not fully evaluated so radiology recommended abdominal MRI to further evaluate. -Also has mass on lower pole of right kidney which is consistent with renal cell carcinoma and can also be further evaluated with the MRI. -Additionally, multiple nodules were seen in the right lower lobe of his lungs which is recommended to have noncontrast chest CT follow-up in 3 to 6 months.   They were initially treated with Mucinex , DuoNeb, Solu-Medrol , ceftriaxone , doxycycline .    Patient was admitted to medicine service for further workup and management of hypoxia as outlined in detail below.   Assessment &  Plan:   Principal Problem:   COPD exacerbation (HCC) Active Problems:   Left inguinal hernia  Left lower lobe pneumonia as seen on chest CT  hypoxia- due to his home oxygen running out. He is now currently at 100% SpO2 on 2L nasal cannula which is his home oxygen requirement.  No wheezing on exam, no productive cough or rhinorrhea reported. Plan: Continue antibiotic therapy for community-acquired pneumonia.  Continue Solu-Medrol  40 mg IV daily.  Continue incentive spirometry use.  Continue therapy evaluations.  Recommendation for skilled nursing facility however patient has declined.  Anticipate discharge in 2 to 3 days   Mild hyperkalemia Monitor and replace as necessary   Inguinal hernia-evaluated by general surgery and is not urgently surgical.  Patient denies any current discomfort. No incarceration nor obstruction on CT.  He can follow-up outpatient with surgery if he and family decide to pursue this - appreciate surgery recs - monitor bowel movements    Mass of pancreas, right kidney (consistent with RCC)-suspicious of malignancy and seen incidentally on abdominal CT scan.  Radiology recommendations include follow-up MRI to further characterize. MRI performed.  Moderate pancreatic duct dilatation with small mass in the pancreas.  Radiology report recommends tissue sampling. Plan: Palliative care engaged.  At this time patient is undecided whether he wants to pursue aggressive treatments.  CA 19-9 is essentially within reference range speaking away from pancreatic adenocarcinoma.  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  This may require endoscopic ultrasound for definitive diagnosis however this is not a urgent  need this procedures are only offered and [facility] or in a tertiary care facility  Given current functional status and poor health literacy patient may not be an ideal treatment candidate.  Would like to engage with patient's sister who he apparently lives with however we have  been unable to reach her    COPD-  - continue PRN breathing treatments   HTN- hold home meds since he has lower-normal BPs   Seizure disorder - continue home keppra    DVT prophylaxis: SQL Code Status: Full Family Communication: None Disposition Plan: Status is: Inpatient Remains inpatient appropriate because: Multiple issues as above   Level of care: Med-Surg  Consultants:  Palliative care  Procedures:  None  Antimicrobials: Rocephin    Subjective: Seen and examined.  Lying in bed.  Answers all questions appropriately.  No distress.  Objective: Vitals:   04/05/24 1554 04/05/24 2000 04/06/24 0450 04/06/24 0815  BP: 114/79 (!) 146/83 (!) 163/94 (!) 155/96  Pulse: 64 63 64 65  Resp: 16 16 16 17   Temp: 98.6 F (37 C) 97.7 F (36.5 C) 97.7 F (36.5 C) 97.8 F (36.6 C)  TempSrc:  Oral    SpO2: 100% 100% 93% 98%  Weight:      Height:        Intake/Output Summary (Last 24 hours) at 04/06/2024 1324 Last data filed at 04/06/2024 0947 Gross per 24 hour  Intake 1657.08 ml  Output 700 ml  Net 957.08 ml   Filed Weights   04/03/24 0341 04/03/24 2236  Weight: 51.7 kg 53.4 kg    Examination:  General exam: NAD.  Appears frail Respiratory system: Left base crackles.  Normal work of breathing.  2 L Cardiovascular system: S1 S2, RRR, no murmurs, no pedal edema Gastrointestinal system: Thin, soft, NT/ND, normal bowel sounds Central nervous system: Alert and oriented. No focal neurological deficits. Extremities: Symmetric 5 x 5 power. Skin: No rashes, lesions or ulcers Psychiatry: Judgement and insight appear normal. Mood & affect appropriate.     Data Reviewed: I have personally reviewed following labs and imaging studies  CBC: Recent Labs  Lab 04/03/24 0358 04/04/24 0447 04/05/24 0951  WBC 11.7* 7.6 12.3*  NEUTROABS 6.3  --  3.9  HGB 7.9* 6.5* 7.7*  HCT 28.0* 22.1* 25.9*  MCV 97.2 93.6 93.5  PLT 180 169 206   Basic Metabolic Panel: Recent Labs   Lab 04/03/24 0359 04/04/24 0447 04/05/24 0951  NA 145 145 145  K 5.2* 5.1 5.0  CL 107 110 109  CO2 27 29 30   GLUCOSE 135* 99 101*  BUN 47* 55* 52*  CREATININE 2.77* 2.53* 2.23*  CALCIUM  8.7* 8.5* 8.7*   GFR: Estimated Creatinine Clearance: 25.6 mL/min (A) (by C-G formula based on SCr of 2.23 mg/dL (H)). Liver Function Tests: Recent Labs  Lab 04/03/24 0359 04/04/24 0447  AST 17 11*  ALT 7 6  ALKPHOS 62 47  BILITOT 0.4 0.3  PROT 9.3* 7.8  ALBUMIN 3.9 3.2*   Recent Labs  Lab 04/03/24 0625  LIPASE 166*   No results for input(s): AMMONIA in the last 168 hours. Coagulation Profile: Recent Labs  Lab 04/03/24 0358  INR 1.1   Cardiac Enzymes: Recent Labs  Lab 04/03/24 0359  CKTOTAL 76   BNP (last 3 results) Recent Labs    04/03/24 0359  PROBNP 3,058.0*   HbA1C: No results for input(s): HGBA1C in the last 72 hours. CBG: No results for input(s): GLUCAP in the last 168 hours. Lipid  Profile: No results for input(s): CHOL, HDL, LDLCALC, TRIG, CHOLHDL, LDLDIRECT in the last 72 hours. Thyroid  Function Tests: No results for input(s): TSH, T4TOTAL, FREET4, T3FREE, THYROIDAB in the last 72 hours.  Anemia Panel: No results for input(s): VITAMINB12, FOLATE, FERRITIN, TIBC, IRON, RETICCTPCT in the last 72 hours. Sepsis Labs: Recent Labs  Lab 04/03/24 9185  LATICACIDVEN 0.6    Recent Results (from the past 240 hours)  Resp panel by RT-PCR (RSV, Flu A&B, Covid) Anterior Nasal Swab     Status: None   Collection Time: 04/03/24  3:59 AM   Specimen: Anterior Nasal Swab  Result Value Ref Range Status   SARS Coronavirus 2 by RT PCR NEGATIVE NEGATIVE Final    Comment: (NOTE) SARS-CoV-2 target nucleic acids are NOT DETECTED.  The SARS-CoV-2 RNA is generally detectable in upper respiratory specimens during the acute phase of infection. The lowest concentration of SARS-CoV-2 viral copies this assay can detect is 138 copies/mL. A  negative result does not preclude SARS-Cov-2 infection and should not be used as the sole basis for treatment or other patient management decisions. A negative result may occur with  improper specimen collection/handling, submission of specimen other than nasopharyngeal swab, presence of viral mutation(s) within the areas targeted by this assay, and inadequate number of viral copies(<138 copies/mL). A negative result must be combined with clinical observations, patient history, and epidemiological information. The expected result is Negative.  Fact Sheet for Patients:  bloggercourse.com  Fact Sheet for Healthcare Providers:  seriousbroker.it  This test is no t yet approved or cleared by the [place]  FDA and  has been authorized for detection and/or diagnosis of SARS-CoV-2 by FDA under an Emergency Use Authorization (EUA). This EUA will remain  in effect (meaning this test can be used) for the duration of the COVID-19 declaration under Section 564(b)(1) of the Act, 21 U.S.C.section 360bbb-3(b)(1), unless the authorization is terminated  or revoked sooner.       Influenza A by PCR NEGATIVE NEGATIVE Final   Influenza B by PCR NEGATIVE NEGATIVE Final    Comment: (NOTE) The Xpert Xpress SARS-CoV-2/FLU/RSV plus assay is intended as an aid in the diagnosis of influenza from Nasopharyngeal swab specimens and should not be used as a sole basis for treatment. Nasal washings and aspirates are unacceptable for Xpert Xpress SARS-CoV-2/FLU/RSV testing.  Fact Sheet for Patients: bloggercourse.com  Fact Sheet for Healthcare Providers: seriousbroker.it  This test is not yet approved or cleared by the [place]  FDA and has been authorized for detection and/or diagnosis of SARS-CoV-2 by FDA under an Emergency Use Authorization (EUA). This EUA will remain in effect (meaning this test can  be used) for the duration of the COVID-19 declaration under Section 564(b)(1) of the Act, 21 U.S.C. section 360bbb-3(b)(1), unless the authorization is terminated or revoked.     Resp Syncytial Virus by PCR NEGATIVE NEGATIVE Final    Comment: (NOTE) Fact Sheet for Patients: bloggercourse.com  Fact Sheet for Healthcare Providers: seriousbroker.it  This test is not yet approved or cleared by the [place]  FDA and has been authorized for detection and/or diagnosis of SARS-CoV-2 by FDA under an Emergency Use Authorization (EUA). This EUA will remain in effect (meaning this test can be used) for the duration of the COVID-19 declaration under Section 564(b)(1) of the Act, 21 U.S.C. section 360bbb-3(b)(1), unless the authorization is terminated or revoked.  Performed at [facility], [address], [place], [place] [address]  Radiology Studies: No results found.       Scheduled Meds:  enoxaparin  (LOVENOX ) injection  30 mg Subcutaneous Q24H   guaiFENesin   600 mg Oral BID   levETIRAcetam   750 mg Oral BID   methylPREDNISolone  (SOLU-MEDROL ) injection  40 mg Intravenous Daily   umeclidinium bromide   1 puff Inhalation Daily   Continuous Infusions:  cefTRIAXone  (ROCEPHIN )  IV 2 g (04/06/24 9392)   lactated ringers  75 mL/hr at 04/06/24 0241     LOS: 3 days      Calvin KATHEE Robson, MD [facility]   If 7PM-7AM, please contact night-coverage  04/06/2024, 1:24 PM

## 2024-04-06 NOTE — TOC Progression Note (Addendum)
 Transition of Care [facility]) - Progression Note    Patient Details  Name: Darin Hopkins MRN: [id number] Date of Birth: May 24, 1960  Transition of [facility]) CM/SW Contact  Lauraine JAYSON Carpen, LCSW Phone Number: 04/06/2024, 3:06 PM  Clinical Narrative:   Patient continues to decline SNF and home health. He will contact his PCP if he changes his mind. He declined RW, stating he already has one at home. Patient confirmed he lives with his sister and brother-in-law. They prepare his food but patient stated he does not eat much. CSW inquired about the bed bugs which he confirmed. CSW asked if they have talked about hiring an exterminator but patient was not sure. Patient has a [address] listed as his address. Home address is [address] in [place]. CSW unable to reach sister by phone. Other staff have also been having difficulty reaching her. Patient told palliative that it is not like her to not answer the phone and he requested a wellness check. CSW left a vm for a conservator, museum/gallery at the [employer].  3:54 pm: CSW called non-emergency number for Sheriff's office. They will go out to the home and call CSW when they have an update.  4:51 pm: Sheriff's office spoke to sister and asked her to call the hospital. New phone number added to chart: [phone].  Expected Discharge Plan: Home/Self Care                 Expected Discharge Plan and Services                                               Social Drivers of Health (SDOH) Interventions SDOH Screenings   Food Insecurity: No Food Insecurity (04/03/2024)  Housing: Low Risk  (04/03/2024)  Transportation Needs: No Transportation Needs (04/03/2024)  Utilities: Not At Risk (04/03/2024)  Depression (PHQ2-9): Low Risk  (02/11/2022)  Tobacco Use: Medium Risk (04/03/2024)    Readmission Risk Interventions     No data to display

## 2024-04-07 DIAGNOSIS — J441 Chronic obstructive pulmonary disease with (acute) exacerbation: Secondary | ICD-10-CM | POA: Diagnosis not present

## 2024-04-07 LAB — BASIC METABOLIC PANEL WITH GFR
Anion gap: 4 — ABNORMAL LOW (ref 5–15)
BUN: 35 mg/dL — ABNORMAL HIGH (ref 8–23)
CO2: 33 mmol/L — ABNORMAL HIGH (ref 22–32)
Calcium: 9.1 mg/dL (ref 8.9–10.3)
Chloride: 111 mmol/L (ref 98–111)
Creatinine, Ser: 1.78 mg/dL — ABNORMAL HIGH (ref 0.61–1.24)
GFR, Estimated: 42 mL/min — ABNORMAL LOW (ref >60)
Glucose, Bld: 178 mg/dL — ABNORMAL HIGH (ref 70–99)
Potassium: 4.7 mmol/L (ref 3.5–5.1)
Sodium: 148 mmol/L — ABNORMAL HIGH (ref 135–145)

## 2024-04-07 LAB — CBC WITH DIFFERENTIAL/PLATELET
Abs Immature Granulocytes: 0.02 K/uL (ref 0.00–0.07)
Basophils Absolute: 0 K/uL (ref 0.0–0.1)
Basophils Relative: 0 %
Eosinophils Absolute: 0 K/uL (ref 0.0–0.5)
Eosinophils Relative: 0 %
HCT: 25.8 % — ABNORMAL LOW (ref 39.0–52.0)
Hemoglobin: 7.6 g/dL — ABNORMAL LOW (ref 13.0–17.0)
Immature Granulocytes: 0 %
Lymphocytes Relative: 57 %
Lymphs Abs: 6.7 K/uL — ABNORMAL HIGH (ref 0.7–4.0)
MCH: 27.5 pg (ref 26.0–34.0)
MCHC: 29.5 g/dL — ABNORMAL LOW (ref 30.0–36.0)
MCV: 93.5 fL (ref 80.0–100.0)
Monocytes Absolute: 0.3 K/uL (ref 0.1–1.0)
Monocytes Relative: 2 %
Neutro Abs: 4.8 K/uL (ref 1.7–7.7)
Neutrophils Relative %: 41 %
Platelets: 231 K/uL (ref 150–400)
RBC: 2.76 MIL/uL — ABNORMAL LOW (ref 4.22–5.81)
RDW: 12.4 % (ref 11.5–15.5)
WBC: 11.7 K/uL — ABNORMAL HIGH (ref 4.0–10.5)
nRBC: 0 % (ref 0.0–0.2)

## 2024-04-07 MED ORDER — TRAZODONE HCL 50 MG PO TABS
50.0000 mg | ORAL_TABLET | Freq: Every day | ORAL | Status: DC
  Administered 2024-04-07 – 2024-04-10 (×4): 50 mg via ORAL
  Filled 2024-04-07 (×4): qty 1

## 2024-04-07 MED ORDER — ENOXAPARIN SODIUM 40 MG/0.4ML IJ SOSY
40.0000 mg | PREFILLED_SYRINGE | INTRAMUSCULAR | Status: DC
  Administered 2024-04-08 – 2024-04-09 (×2): 40 mg via SUBCUTANEOUS
  Filled 2024-04-07 (×2): qty 0.4

## 2024-04-07 NOTE — Plan of Care (Signed)
  Problem: Skin Integrity: Goal: Risk for impaired skin integrity will decrease Outcome: Progressing   Problem: Education: Goal: Knowledge of disease or condition will improve Outcome: Progressing

## 2024-04-07 NOTE — Progress Notes (Signed)
 PROGRESS NOTE    Darin Hopkins  FMW:[id number] DOB: Mar 14, 1961 DOA: 04/03/2024 PCP: [name], NP    Brief Narrative:  63 y.o. male with a PMH significant for COPD, HTN, left inguinal hernia, GERD, CHF, history of seizure, HLD, alcohol dependence, protein calorie malnutrition, CKD 3.   They presented from home to the ED on 04/03/2024 with concerns for considerable weakness x 2 days.  Per EMS report, patient's O2 saturations on their initial evaluation were in the 50s which appears to be due to the fact that his oxygen tank was empty and chronically is supposed to be on 2 to 3 L.  They also reported significant bedbugs in his home.  He was put on home oxygen with significant improvement.   In the ED, it was found that they had stable vital signs on 2 L nasal cannula.  It appears that patient was put on BiPAP at some point earlier this morning but there is no documented desaturation or reason for this and when I evaluated patient he was stable and comfortable so transitioned back to his home nasal cannula oxygen.  Significant findings included: Essentially pan CT scanned in the ED with several findings:   -Possible left lower lobe pneumonia on chest CT.   -Large left groin hernia without obstruction or incarceration. -Abnormal findings in the pancreas on CT scan were concerning for mass but not fully evaluated so radiology recommended abdominal MRI to further evaluate. -Also has mass on lower pole of right kidney which is consistent with renal cell carcinoma and can also be further evaluated with the MRI. -Additionally, multiple nodules were seen in the right lower lobe of his lungs which is recommended to have noncontrast chest CT follow-up in 3 to 6 months.   They were initially treated with Mucinex , DuoNeb, Solu-Medrol , ceftriaxone , doxycycline .    Patient was admitted to medicine service for further workup and management of hypoxia as outlined in detail below.   Assessment &  Plan:   Principal Problem:   COPD exacerbation (HCC) Active Problems:   Left inguinal hernia  Left lower lobe pneumonia as seen on chest CT  hypoxia- due to his home oxygen running out. He is now currently at 100% SpO2 on 2L nasal cannula which is his home oxygen requirement.  No wheezing on exam, no productive cough or rhinorrhea reported. Plan: Continue IV Solu-Medrol  for total 5-day course Discontinue fluids Discontinue Solu-Medrol  Encourage incentive spirometry  Recommendation for skilled nursing facility however patient is refusing SNF or home health Will plan for discharge Monday  Mild hyperkalemia Monitor and replace as necessary   Inguinal hernia-evaluated by general surgery and is not urgently surgical.  Patient denies any current discomfort. No incarceration nor obstruction on CT.  He can follow-up outpatient with surgery if he and family decide to pursue this - appreciate surgery recs - monitor bowel movements    Mass of pancreas, right kidney (consistent with RCC)-suspicious of malignancy and seen incidentally on abdominal CT scan.  Radiology recommendations include follow-up MRI to further characterize. MRI performed.  Moderate pancreatic duct dilatation with small mass in the pancreas.  Radiology report recommends tissue sampling. Plan: Palliative care engaged.  At this time patient is undecided whether he wants to pursue aggressive treatments.  CA 19-9 is essentially within reference range speaking away from pancreatic adenocarcinoma.  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  This may require endoscopic ultrasound for definitive diagnosis however this is not a urgent need this procedures are only offered and [facility] or in a tertiary care facility    COPD-  - continue PRN breathing treatments   HTN- hold home meds since he has lower-normal BPs   Seizure disorder - continue home keppra    DVT prophylaxis: SQL Code Status: Full Family Communication: Niece via  phone 11/29 Disposition Plan: Status is: Inpatient Remains inpatient appropriate because: Multiple issues as above   Level of care: Med-Surg  Consultants:  Palliative care  Procedures:  None  Antimicrobials: Rocephin    Subjective: Seen and examined.  Lying in bed.  Answers all questions appropriately.  No distress.  Objective: Vitals:   04/06/24 1451 04/06/24 2015 04/07/24 0430 04/07/24 0751  BP: (!) 176/96 (!) 165/94 (!) 159/92 (!) 143/68  Pulse: 72 69 70 (!) 51  Resp: 17 18 17 15   Temp: 97.6 F (36.4 C) 97.7 F (36.5 C) 97.8 F (36.6 C) 98.6 F (37 C)  TempSrc:  Oral Oral Oral  SpO2: 100% 100% 100% 100%  Weight:      Height:        Intake/Output Summary (Last 24 hours) at 04/07/2024 1240 Last data filed at 04/07/2024 0939 Gross per 24 hour  Intake 840 ml  Output 2425 ml  Net -1585 ml   Filed Weights   04/03/24 0341 04/03/24 2236  Weight: 51.7 kg 53.4 kg    Examination:  General exam: NAD.  In good spirits.  Energy level improved Respiratory system: Left base crackles.  Normal work of breathing.  2 L Cardiovascular system: S1 S2, RRR, no murmurs, no pedal edema Gastrointestinal system: Thin, soft, NT/ND, normal bowel sounds Central nervous system: Alert and oriented. No focal neurological deficits. Extremities: Symmetric 5 x 5 power. Skin: No rashes, lesions or ulcers Psychiatry: Judgement and insight appear normal. Mood & affect appropriate.     Data Reviewed: I have personally reviewed following labs and imaging studies  CBC: Recent Labs  Lab 04/03/24 0358 04/04/24 0447 04/05/24 0951 04/07/24 1130  WBC 11.7* 7.6 12.3* 11.7*  NEUTROABS 6.3  --  3.9 4.8  HGB 7.9* 6.5* 7.7* 7.6*  HCT 28.0* 22.1* 25.9* 25.8*  MCV 97.2 93.6 93.5 93.5  PLT 180 169 206 231   Basic Metabolic Panel: Recent Labs  Lab 04/03/24 0359 04/04/24 0447 04/05/24 0951 04/07/24 1130  NA 145 145 145 148*  K 5.2* 5.1 5.0 4.7  CL 107 110 109 111  CO2 27 29 30  33*   GLUCOSE 135* 99 101* 178*  BUN 47* 55* 52* 35*  CREATININE 2.77* 2.53* 2.23* 1.78*  CALCIUM  8.7* 8.5* 8.7* 9.1   GFR: Estimated Creatinine Clearance: 32.1 mL/min (A) (by C-G formula based on SCr of 1.78 mg/dL (H)). Liver Function Tests: Recent Labs  Lab 04/03/24 0359 04/04/24 0447  AST 17 11*  ALT 7 6  ALKPHOS 62 47  BILITOT 0.4 0.3  PROT 9.3* 7.8  ALBUMIN 3.9 3.2*   Recent Labs  Lab 04/03/24 0625  LIPASE 166*   No results for input(s): AMMONIA in the last 168 hours. Coagulation Profile: Recent Labs  Lab 04/03/24 0358  INR 1.1   Cardiac Enzymes: Recent Labs  Lab 04/03/24 0359  CKTOTAL 76   BNP (last 3 results) Recent Labs    04/03/24 0359  PROBNP 3,058.0*   HbA1C: No results for input(s): HGBA1C in the last 72 hours. CBG: No results for input(s): GLUCAP in the last 168 hours. Lipid Profile: No results for input(s): CHOL, HDL, LDLCALC, TRIG, CHOLHDL, LDLDIRECT in the last 72 hours. Thyroid  Function  Tests: No results for input(s): TSH, T4TOTAL, FREET4, T3FREE, THYROIDAB in the last 72 hours.  Anemia Panel: No results for input(s): VITAMINB12, FOLATE, FERRITIN, TIBC, IRON, RETICCTPCT in the last 72 hours. Sepsis Labs: Recent Labs  Lab 04/03/24 9185  LATICACIDVEN 0.6    Recent Results (from the past 240 hours)  Resp panel by RT-PCR (RSV, Flu A&B, Covid) Anterior Nasal Swab     Status: None   Collection Time: 04/03/24  3:59 AM   Specimen: Anterior Nasal Swab  Result Value Ref Range Status   SARS Coronavirus 2 by RT PCR NEGATIVE NEGATIVE Final    Comment: (NOTE) SARS-CoV-2 target nucleic acids are NOT DETECTED.  The SARS-CoV-2 RNA is generally detectable in upper respiratory specimens during the acute phase of infection. The lowest concentration of SARS-CoV-2 viral copies this assay can detect is 138 copies/mL. A negative result does not preclude SARS-Cov-2 infection and should not be used as the sole basis  for treatment or other patient management decisions. A negative result may occur with  improper specimen collection/handling, submission of specimen other than nasopharyngeal swab, presence of viral mutation(s) within the areas targeted by this assay, and inadequate number of viral copies(<138 copies/mL). A negative result must be combined with clinical observations, patient history, and epidemiological information. The expected result is Negative.  Fact Sheet for Patients:  bloggercourse.com  Fact Sheet for Healthcare Providers:  seriousbroker.it  This test is no t yet approved or cleared by the [place]  FDA and  has been authorized for detection and/or diagnosis of SARS-CoV-2 by FDA under an Emergency Use Authorization (EUA). This EUA will remain  in effect (meaning this test can be used) for the duration of the COVID-19 declaration under Section 564(b)(1) of the Act, 21 U.S.C.section 360bbb-3(b)(1), unless the authorization is terminated  or revoked sooner.       Influenza A by PCR NEGATIVE NEGATIVE Final   Influenza B by PCR NEGATIVE NEGATIVE Final    Comment: (NOTE) The Xpert Xpress SARS-CoV-2/FLU/RSV plus assay is intended as an aid in the diagnosis of influenza from Nasopharyngeal swab specimens and should not be used as a sole basis for treatment. Nasal washings and aspirates are unacceptable for Xpert Xpress SARS-CoV-2/FLU/RSV testing.  Fact Sheet for Patients: bloggercourse.com  Fact Sheet for Healthcare Providers: seriousbroker.it  This test is not yet approved or cleared by the [place]  FDA and has been authorized for detection and/or diagnosis of SARS-CoV-2 by FDA under an Emergency Use Authorization (EUA). This EUA will remain in effect (meaning this test can be used) for the duration of the COVID-19 declaration under Section 564(b)(1) of the Act, 21  U.S.C. section 360bbb-3(b)(1), unless the authorization is terminated or revoked.     Resp Syncytial Virus by PCR NEGATIVE NEGATIVE Final    Comment: (NOTE) Fact Sheet for Patients: bloggercourse.com  Fact Sheet for Healthcare Providers: seriousbroker.it  This test is not yet approved or cleared by the [place]  FDA and has been authorized for detection and/or diagnosis of SARS-CoV-2 by FDA under an Emergency Use Authorization (EUA). This EUA will remain in effect (meaning this test can be used) for the duration of the COVID-19 declaration under Section 564(b)(1) of the Act, 21 U.S.C. section 360bbb-3(b)(1), unless the authorization is terminated or revoked.  Performed at [facility], [address] [place], [place] [address]          Radiology Studies: No results found.       Scheduled Meds:  [START ON 04/08/2024] enoxaparin  (  LOVENOX ) injection  40 mg Subcutaneous Q24H   guaiFENesin   600 mg Oral BID   influenza vac split trivalent PF  0.5 mL Intramuscular Tomorrow-1000   levETIRAcetam   750 mg Oral BID   methylPREDNISolone  (SOLU-MEDROL ) injection  40 mg Intravenous Daily   pneumococcal 20-valent conjugate vaccine  0.5 mL Intramuscular Tomorrow-1000   umeclidinium bromide   1 puff Inhalation Daily   Continuous Infusions:  cefTRIAXone  (ROCEPHIN )  IV 2 g (04/07/24 0607)   lactated ringers  75 mL/hr at 04/07/24 0915     LOS: 4 days      Calvin KATHEE Robson, MD [facility]   If 7PM-7AM, please contact night-coverage  04/07/2024, 12:40 PM

## 2024-04-07 NOTE — Plan of Care (Signed)
  Problem: Clinical Measurements: Goal: Will remain free from infection Outcome: Progressing Goal: Diagnostic test results will improve Outcome: Progressing Goal: Respiratory complications will improve Outcome: Progressing Goal: Cardiovascular complication will be avoided Outcome: Progressing   Problem: Pain Managment: Goal: General experience of comfort will improve and/or be controlled Outcome: Progressing   Problem: Safety: Goal: Ability to remain free from injury will improve Outcome: Progressing   Problem: Skin Integrity: Goal: Risk for impaired skin integrity will decrease Outcome: Progressing   Problem: Activity: Goal: Ability to tolerate increased activity will improve Outcome: Progressing

## 2024-04-07 NOTE — TOC Progression Note (Signed)
 Transition of Care [facility]) - Progression Note    Patient Details  Name: Darin Hopkins MRN: [id number] Date of Birth: 17-Dec-1960  [facility] CM/SW Contact  [name], [place] Phone Number: 04/07/2024, 1:20 PM  Clinical Narrative:     CSW spoke with patients sister. She advised that she is trying to get her home treated for bed bugs. She stated that she has called the exterminator and she has to pay $1,000. She advised that she believes that it is best for her brother to go to a SNF. She advised that she has not been up to the hospital to see him because she is fearful of bringing bed bugs into the hospital. She advised that she will call him and discuss options and she will encourage him to reconsider.   Sister was advised of options considering patient has [place] Medicaid. CSW advised that there are not a lot of facilities that accept Medicaid but if patient agrees, TOC can complete a SNF work up.   Expected Discharge Plan: Home/Self Care                 Expected Discharge Plan and Services                                               Social Drivers of Health (SDOH) Interventions SDOH Screenings   Food Insecurity: No Food Insecurity (04/03/2024)  Housing: Low Risk  (04/03/2024)  Transportation Needs: No Transportation Needs (04/03/2024)  Utilities: Not At Risk (04/03/2024)  Depression (PHQ2-9): Low Risk  (02/11/2022)  Tobacco Use: Medium Risk (04/03/2024)    Readmission Risk Interventions     No data to display

## 2024-04-08 DIAGNOSIS — J441 Chronic obstructive pulmonary disease with (acute) exacerbation: Secondary | ICD-10-CM | POA: Diagnosis not present

## 2024-04-08 MED ORDER — SODIUM CHLORIDE 0.9 % IV SOLN: INTRAVENOUS | Status: DC | PRN

## 2024-04-08 NOTE — Progress Notes (Signed)
 1754 bladder scan residual . Per MD Sreenath, repeat bladder scan in 6 hours.

## 2024-04-08 NOTE — Plan of Care (Signed)
  Problem: Clinical Measurements: Goal: Diagnostic test results will improve Outcome: Progressing   Problem: Clinical Measurements: Goal: Respiratory complications will improve Outcome: Progressing   Problem: Pain Managment: Goal: General experience of comfort will improve and/or be controlled Outcome: Progressing

## 2024-04-08 NOTE — Progress Notes (Signed)
 PROGRESS NOTE    Darin Hopkins  FMW:[id number] DOB: 07-Feb-1961 DOA: 04/03/2024 PCP: [name], NP    Brief Narrative:  63 y.o. male with a PMH significant for COPD, HTN, left inguinal hernia, GERD, CHF, history of seizure, HLD, alcohol dependence, protein calorie malnutrition, CKD 3.   They presented from home to the ED on 04/03/2024 with concerns for considerable weakness x 2 days.  Per EMS report, patient's O2 saturations on their initial evaluation were in the 50s which appears to be due to the fact that his oxygen tank was empty and chronically is supposed to be on 2 to 3 L.  They also reported significant bedbugs in his home.  He was put on home oxygen with significant improvement.   In the ED, it was found that they had stable vital signs on 2 L nasal cannula.  It appears that patient was put on BiPAP at some point earlier this morning but there is no documented desaturation or reason for this and when I evaluated patient he was stable and comfortable so transitioned back to his home nasal cannula oxygen.  Significant findings included: Essentially pan CT scanned in the ED with several findings:   -Possible left lower lobe pneumonia on chest CT.   -Large left groin hernia without obstruction or incarceration. -Abnormal findings in the pancreas on CT scan were concerning for mass but not fully evaluated so radiology recommended abdominal MRI to further evaluate. -Also has mass on lower pole of right kidney which is consistent with renal cell carcinoma and can also be further evaluated with the MRI. -Additionally, multiple nodules were seen in the right lower lobe of his lungs which is recommended to have noncontrast chest CT follow-up in 3 to 6 months.   They were initially treated with Mucinex , DuoNeb, Solu-Medrol , ceftriaxone , doxycycline .    Patient was admitted to medicine service for further workup and management of hypoxia as outlined in detail below.   Assessment &  Plan:   Principal Problem:   COPD exacerbation (HCC) Active Problems:   Left inguinal hernia  Left lower lobe pneumonia as seen on chest CT  hypoxia- due to his home oxygen running out. He is now currently at 100% SpO2 on 2L nasal cannula which is his home oxygen requirement.  No wheezing on exam, no productive cough or rhinorrhea reported. Plan: Continue IV ceftriaxone  for total 5-day course Encourage incentive spirometry  Patient agreeable to skilled nursing facility Anticipate medical readiness for discharge tomorrow  Mild hyperkalemia Monitor and replace as necessary   Inguinal hernia-evaluated by general surgery and is not urgently surgical.  Patient denies any current discomfort. No incarceration nor obstruction on CT.  He can follow-up outpatient with surgery if he and family decide to pursue this - appreciate surgery recs - monitor bowel movements    Mass of pancreas, right kidney (consistent with RCC)-suspicious of malignancy and seen incidentally on abdominal CT scan.  Radiology recommendations include follow-up MRI to further characterize. MRI performed.  Moderate pancreatic duct dilatation with small mass in the pancreas.  Radiology report recommends tissue sampling. Plan: Palliative care engaged.  At this time patient is undecided whether he wants to pursue aggressive treatments.  CA 19-9 is essentially within reference range speaking away from pancreatic adenocarcinoma.  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  This may require endoscopic ultrasound for definitive diagnosis however this is not a urgent need this procedures are only offered and [facility] or in a tertiary care facility  COPD-  - continue PRN breathing treatments   HTN- hold home meds since he has lower-normal BPs   Seizure disorder - continue home keppra    DVT prophylaxis: SQL Code Status: Full Family Communication: Niece via phone 11/29 Disposition Plan: Status is: Inpatient Remains inpatient  appropriate because: Multiple issues as above   Level of care: Med-Surg  Consultants:  Palliative care  Procedures:  None  Antimicrobials: Rocephin    Subjective: Seen and examined.  Lying in bed.  No distress  Objective: Vitals:   04/07/24 1941 04/08/24 0529 04/08/24 0745 04/08/24 0755  BP: (!) 154/94 (!) 155/92 (!) 151/72   Pulse: 71 72 (!) 48 (!) 52  Resp: 20 18 16    Temp: 98.2 F (36.8 C) 98.3 F (36.8 C) 98.5 F (36.9 C)   TempSrc:      SpO2: 100% 100% 100%   Weight:      Height:        Intake/Output Summary (Last 24 hours) at 04/08/2024 1150 Last data filed at 04/08/2024 0900 Gross per 24 hour  Intake 1555.39 ml  Output 1050 ml  Net 505.39 ml   Filed Weights   04/03/24 0341 04/03/24 2236  Weight: 51.7 kg 53.4 kg    Examination:  General exam: No acute distress Respiratory system: Left base crackles.  Normal work of breathing.  2 L Cardiovascular system: S1 S2, RRR, no murmurs, no pedal edema Gastrointestinal system: Thin, soft, NT/ND, normal bowel sounds Central nervous system: Alert and oriented. No focal neurological deficits. Extremities: Symmetric 5 x 5 power. Skin: No rashes, lesions or ulcers Psychiatry: Judgement and insight appear normal. Mood & affect appropriate.     Data Reviewed: I have personally reviewed following labs and imaging studies  CBC: Recent Labs  Lab 04/03/24 0358 04/04/24 0447 04/05/24 0951 04/07/24 1130  WBC 11.7* 7.6 12.3* 11.7*  NEUTROABS 6.3  --  3.9 4.8  HGB 7.9* 6.5* 7.7* 7.6*  HCT 28.0* 22.1* 25.9* 25.8*  MCV 97.2 93.6 93.5 93.5  PLT 180 169 206 231   Basic Metabolic Panel: Recent Labs  Lab 04/03/24 0359 04/04/24 0447 04/05/24 0951 04/07/24 1130  NA 145 145 145 148*  K 5.2* 5.1 5.0 4.7  CL 107 110 109 111  CO2 27 29 30  33*  GLUCOSE 135* 99 101* 178*  BUN 47* 55* 52* 35*  CREATININE 2.77* 2.53* 2.23* 1.78*  CALCIUM  8.7* 8.5* 8.7* 9.1   GFR: Estimated Creatinine Clearance: 32.1 mL/min (A)  (by C-G formula based on SCr of 1.78 mg/dL (H)). Liver Function Tests: Recent Labs  Lab 04/03/24 0359 04/04/24 0447  AST 17 11*  ALT 7 6  ALKPHOS 62 47  BILITOT 0.4 0.3  PROT 9.3* 7.8  ALBUMIN 3.9 3.2*   Recent Labs  Lab 04/03/24 0625  LIPASE 166*   No results for input(s): AMMONIA in the last 168 hours. Coagulation Profile: Recent Labs  Lab 04/03/24 0358  INR 1.1   Cardiac Enzymes: Recent Labs  Lab 04/03/24 0359  CKTOTAL 76   BNP (last 3 results) Recent Labs    04/03/24 0359  PROBNP 3,058.0*   HbA1C: No results for input(s): HGBA1C in the last 72 hours. CBG: No results for input(s): GLUCAP in the last 168 hours. Lipid Profile: No results for input(s): CHOL, HDL, LDLCALC, TRIG, CHOLHDL, LDLDIRECT in the last 72 hours. Thyroid  Function Tests: No results for input(s): TSH, T4TOTAL, FREET4, T3FREE, THYROIDAB in the last 72 hours.  Anemia Panel: No results for input(s): VITAMINB12, FOLATE, FERRITIN,  TIBC, IRON, RETICCTPCT in the last 72 hours. Sepsis Labs: Recent Labs  Lab 04/03/24 9185  LATICACIDVEN 0.6    Recent Results (from the past 240 hours)  Resp panel by RT-PCR (RSV, Flu A&B, Covid) Anterior Nasal Swab     Status: None   Collection Time: 04/03/24  3:59 AM   Specimen: Anterior Nasal Swab  Result Value Ref Range Status   SARS Coronavirus 2 by RT PCR NEGATIVE NEGATIVE Final    Comment: (NOTE) SARS-CoV-2 target nucleic acids are NOT DETECTED.  The SARS-CoV-2 RNA is generally detectable in upper respiratory specimens during the acute phase of infection. The lowest concentration of SARS-CoV-2 viral copies this assay can detect is 138 copies/mL. A negative result does not preclude SARS-Cov-2 infection and should not be used as the sole basis for treatment or other patient management decisions. A negative result may occur with  improper specimen collection/handling, submission of specimen other than  nasopharyngeal swab, presence of viral mutation(s) within the areas targeted by this assay, and inadequate number of viral copies(<138 copies/mL). A negative result must be combined with clinical observations, patient history, and epidemiological information. The expected result is Negative.  Fact Sheet for Patients:  bloggercourse.com  Fact Sheet for Healthcare Providers:  seriousbroker.it  This test is no t yet approved or cleared by the [place]  FDA and  has been authorized for detection and/or diagnosis of SARS-CoV-2 by FDA under an Emergency Use Authorization (EUA). This EUA will remain  in effect (meaning this test can be used) for the duration of the COVID-19 declaration under Section 564(b)(1) of the Act, 21 U.S.C.section 360bbb-3(b)(1), unless the authorization is terminated  or revoked sooner.       Influenza A by PCR NEGATIVE NEGATIVE Final   Influenza B by PCR NEGATIVE NEGATIVE Final    Comment: (NOTE) The Xpert Xpress SARS-CoV-2/FLU/RSV plus assay is intended as an aid in the diagnosis of influenza from Nasopharyngeal swab specimens and should not be used as a sole basis for treatment. Nasal washings and aspirates are unacceptable for Xpert Xpress SARS-CoV-2/FLU/RSV testing.  Fact Sheet for Patients: bloggercourse.com  Fact Sheet for Healthcare Providers: seriousbroker.it  This test is not yet approved or cleared by the [place]  FDA and has been authorized for detection and/or diagnosis of SARS-CoV-2 by FDA under an Emergency Use Authorization (EUA). This EUA will remain in effect (meaning this test can be used) for the duration of the COVID-19 declaration under Section 564(b)(1) of the Act, 21 U.S.C. section 360bbb-3(b)(1), unless the authorization is terminated or revoked.     Resp Syncytial Virus by PCR NEGATIVE NEGATIVE Final    Comment:  (NOTE) Fact Sheet for Patients: bloggercourse.com  Fact Sheet for Healthcare Providers: seriousbroker.it  This test is not yet approved or cleared by the [place]  FDA and has been authorized for detection and/or diagnosis of SARS-CoV-2 by FDA under an Emergency Use Authorization (EUA). This EUA will remain in effect (meaning this test can be used) for the duration of the COVID-19 declaration under Section 564(b)(1) of the Act, 21 U.S.C. section 360bbb-3(b)(1), unless the authorization is terminated or revoked.  Performed at [facility], [address], [place], [place] [address]          Radiology Studies: No results found.       Scheduled Meds:  enoxaparin  (LOVENOX ) injection  40 mg Subcutaneous Q24H   guaiFENesin   600 mg Oral BID   influenza vac split trivalent PF  0.5 mL Intramuscular Tomorrow-1000  levETIRAcetam   750 mg Oral BID   pneumococcal 20-valent conjugate vaccine  0.5 mL Intramuscular Tomorrow-1000   traZODone   50 mg Oral QHS   umeclidinium bromide   1 puff Inhalation Daily   Continuous Infusions:  sodium chloride  10 mL/hr at 04/08/24 0601   cefTRIAXone  (ROCEPHIN )  IV 2 g (04/08/24 0604)     LOS: 5 days      Calvin KATHEE Robson, MD [facility]   If 7PM-7AM, please contact night-coverage  04/08/2024, 11:50 AM

## 2024-04-08 NOTE — TOC Progression Note (Signed)
 Transition of Care [facility]) - Progression Note    Patient Details  Name: Darin Hopkins MRN: [id number] Date of Birth: [birth year]  Transition of Care [facility] CM/SW Contact  Skylarr Liz L Canda [place], [place] Phone Number: 04/08/2024, 4:45 PM  Clinical Narrative:     CSW met with patient. No family at bedside. CSW and patient discussed SNF recommendations. Patient is agreeable to going to SNF.   FL2 will be completed and bed search will be initiated.   Expected Discharge Plan: Home/Self Care                 Expected Discharge Plan and Services                                               Social Drivers of Health (SDOH) Interventions SDOH Screenings   Food Insecurity: No Food Insecurity (04/03/2024)  Housing: Low Risk  (04/03/2024)  Transportation Needs: No Transportation Needs (04/03/2024)  Utilities: Not At Risk (04/03/2024)  Depression (PHQ2-9): Low Risk  (02/11/2022)  Tobacco Use: Medium Risk (04/03/2024)    Readmission Risk Interventions     No data to display

## 2024-04-08 NOTE — Plan of Care (Signed)

## 2024-04-08 NOTE — NC FL2 (Signed)
 [place]  MEDICAID [id number] LEVEL OF CARE FORM     IDENTIFICATION  Patient Name: Darin Hopkins Birthdate: Feb 27, 1961 Sex: male Admission Date (Current Location): 04/03/2024  [facility] and [place] Number:  Chiropodist and Address:  [facility], [address] [place], [place] [address]      Provider Number: [id number]  Attending Physician Name and Address:  Jhonny Calvin NOVAK, MD  Relative Name and Phone Number:  SHLOMO RONAL LITTIE (Sister)  [phone] [facility])    Current Level of Care: Hospital Recommended Level of Care: Skilled Nursing Facility Prior Approval Number:    Date Approved/Denied: 04/08/24 PASRR Number: [id number]  Discharge Plan: SNF    Current Diagnoses: Patient Active Problem List   Diagnosis Date Noted   Left inguinal hernia 04/03/2024   Chronic respiratory failure with hypoxia and hypercapnia (HCC) 01/16/2021   Acute hypoxemic respiratory failure (HCC) 11/25/2020   AKI (acute kidney injury) 11/25/2020   Status epilepticus (HCC) 09/09/2020   COPD exacerbation (HCC) 08/27/2020   Protein-calorie malnutrition, severe 08/21/2020   Acute hypercapnic respiratory failure (HCC)    Acute decompensated heart failure (HCC) 08/18/2020   Acute exacerbation of CHF (congestive heart failure) (HCC) 08/17/2020   Essential hypertension 08/17/2020   History of seizure 08/17/2020   COPD  GOLD ? spirometry / 02 dep/ hypercarbic  08/17/2020   Hyperlipidemia 08/17/2020   GERD (gastroesophageal reflux disease) 08/17/2020   Alcohol abuse 08/17/2020   Chronic kidney disease, stage III (moderate) (HCC) 10/24/2012    Orientation RESPIRATION BLADDER Height & Weight     Self, Time, Situation  O2 External catheter Weight: 117 lb 11.6 oz (53.4 kg) Height:  5' 7 (170.2 cm)  BEHAVIORAL SYMPTOMS/MOOD NEUROLOGICAL BOWEL NUTRITION STATUS    Convulsions/Seizures Continent Diet  AMBULATORY STATUS COMMUNICATION OF NEEDS Skin   Extensive Assist  Verbally Other (Comment) (Dry flaky, abrasions)                       Personal Care Assistance Level of Assistance  Bathing, Feeding, Dressing Bathing Assistance: Maximum assistance Feeding assistance: Limited assistance Dressing Assistance: Maximum assistance     Functional Limitations Info  Speech     Speech Info: Impaired    SPECIAL CARE FACTORS FREQUENCY  PT (By licensed PT), OT (By licensed OT)     PT Frequency: 2x OT Frequency: 2X            Contractures Contractures Info: Not present    Additional Factors Info  Code Status, Allergies Code Status Info: FULL Allergies Info: NKA           Current Medications (04/08/2024):  This is the current hospital active medication list Current Facility-Administered Medications  Medication Dose Route Frequency Provider Last Rate Last Admin   0.9 %  sodium chloride  infusion   Intravenous PRN Sreenath, Sudheer B, MD 10 mL/hr at 04/08/24 0601 New Bag at 04/08/24 0601   acetaminophen  (TYLENOL ) tablet 650 mg  650 mg Oral Q6H PRN Duncan, Hazel V, MD   650 mg at 04/06/24 1949   Or   acetaminophen  (TYLENOL ) suppository 650 mg  650 mg Rectal Q6H PRN Cleatus Delayne GAILS, MD       albuterol  (PROVENTIL ) (2.5 MG/3ML) 0.083% nebulizer solution 2.5 mg  2.5 mg Nebulization Q2H PRN Duncan, Hazel V, MD       cefTRIAXone  (ROCEPHIN ) 2 g in sodium chloride  0.9 % 100 mL IVPB  2 g Intravenous Q24H Sreenath, Sudheer B, MD 200 mL/hr at 04/08/24  0604 2 g at 04/08/24 0604   enoxaparin  (LOVENOX ) injection 40 mg  40 mg Subcutaneous Q24H Elesa Perkins, RPH   40 mg at 04/08/24 1005   guaiFENesin  (MUCINEX ) 12 hr tablet 600 mg  600 mg Oral BID Duncan, Hazel V, MD   600 mg at 04/08/24 1005   HYDROcodone -acetaminophen  (NORCO/VICODIN) 5-325 MG per tablet 1-2 tablet  1-2 tablet Oral Q4H PRN Duncan, Hazel V, MD   1 tablet at 04/07/24 1308   influenza vac split trivalent PF (FLUZONE) injection 0.5 mL  0.5 mL Intramuscular Tomorrow-1000 Sreenath, Sudheer B, MD        ipratropium-albuterol  (DUONEB) 0.5-2.5 (3) MG/3ML nebulizer solution 3 mL  3 mL Nebulization Q6H PRN Lenon Marien CROME, MD       levETIRAcetam  (KEPPRA ) tablet 750 mg  750 mg Oral BID Lenon Marien CROME, MD   750 mg at 04/08/24 1005   ondansetron  (ZOFRAN ) tablet 4 mg  4 mg Oral Q6H PRN Duncan, Hazel V, MD       Or   ondansetron  (ZOFRAN ) injection 4 mg  4 mg Intravenous Q6H PRN Duncan, Hazel V, MD   4 mg at 04/07/24 0100   pneumococcal 20-valent conjugate vaccine (PREVNAR 20) injection 0.5 mL  0.5 mL Intramuscular Tomorrow-1000 Sreenath, Sudheer B, MD       traZODone  (DESYREL ) tablet 50 mg  50 mg Oral QHS Sreenath, Sudheer B, MD   50 mg at 04/07/24 2126   umeclidinium bromide  (INCRUSE ELLIPTA ) 62.5 MCG/ACT 1 puff  1 puff Inhalation Daily Lenon Marien CROME, MD   1 puff at 04/08/24 9244     Discharge Medications: Please see discharge summary for a list of discharge medications.  Relevant Imaging Results:  Relevant Lab Results:   Additional Information [id number]  Danne Vasek L Wilton Thrall, LCSW

## 2024-04-09 DIAGNOSIS — J441 Chronic obstructive pulmonary disease with (acute) exacerbation: Secondary | ICD-10-CM | POA: Diagnosis not present

## 2024-04-09 LAB — BASIC METABOLIC PANEL WITH GFR
Anion gap: 5 (ref 5–15)
BUN: 34 mg/dL — ABNORMAL HIGH (ref 8–23)
CO2: 33 mmol/L — ABNORMAL HIGH (ref 22–32)
Calcium: 9 mg/dL (ref 8.9–10.3)
Chloride: 112 mmol/L — ABNORMAL HIGH (ref 98–111)
Creatinine, Ser: 1.89 mg/dL — ABNORMAL HIGH (ref 0.61–1.24)
GFR, Estimated: 39 mL/min — ABNORMAL LOW (ref >60)
Glucose, Bld: 84 mg/dL (ref 70–99)
Potassium: 4.9 mmol/L (ref 3.5–5.1)
Sodium: 150 mmol/L — ABNORMAL HIGH (ref 135–145)

## 2024-04-09 LAB — CBC WITH DIFFERENTIAL/PLATELET
Abs Immature Granulocytes: 0.03 K/uL (ref 0.00–0.07)
Basophils Absolute: 0 K/uL (ref 0.0–0.1)
Basophils Relative: 0 %
Eosinophils Absolute: 0.3 K/uL (ref 0.0–0.5)
Eosinophils Relative: 2 %
HCT: 25.8 % — ABNORMAL LOW (ref 39.0–52.0)
Hemoglobin: 7.5 g/dL — ABNORMAL LOW (ref 13.0–17.0)
Immature Granulocytes: 0 %
Lymphocytes Relative: 63 %
Lymphs Abs: 7.9 K/uL — ABNORMAL HIGH (ref 0.7–4.0)
MCH: 27.4 pg (ref 26.0–34.0)
MCHC: 29.1 g/dL — ABNORMAL LOW (ref 30.0–36.0)
MCV: 94.2 fL (ref 80.0–100.0)
Monocytes Absolute: 0.6 K/uL (ref 0.1–1.0)
Monocytes Relative: 5 %
Neutro Abs: 3.8 K/uL (ref 1.7–7.7)
Neutrophils Relative %: 30 %
Platelets: 236 K/uL (ref 150–400)
RBC: 2.74 MIL/uL — ABNORMAL LOW (ref 4.22–5.81)
RDW: 12.7 % (ref 11.5–15.5)
Smear Review: NORMAL
WBC: 12.6 K/uL — ABNORMAL HIGH (ref 4.0–10.5)
nRBC: 0 % (ref 0.0–0.2)

## 2024-04-09 NOTE — Progress Notes (Signed)
 Mobility Specialist - Progress Note   04/09/24 1546  Mobility  Activity Ambulated with assistance  Level of Assistance Contact guard assist, steadying assist  Assistive Device Front wheel walker  Distance Ambulated (ft) 16 ft  Activity Response Tolerated well  Mobility visit 1 Mobility  Mobility Specialist Start Time (ACUTE ONLY) 1459  Mobility Specialist Stop Time (ACUTE ONLY) 1521  Mobility Specialist Time Calculation (min) (ACUTE ONLY) 22 min   Pt supine upon entry, utilizing [place]. Pt agreeable to limited OOB amb this date. Pt completed bed mob indep, STS to RW and amb a self-selected 8 ft towards the door before returning to bed. Pt left supine with alarm set and needs within reach.   America Silvan Mobility Specialist 04/09/24 3:57 PM

## 2024-04-09 NOTE — Plan of Care (Signed)
   Problem: Education: Goal: Knowledge of General Education information will improve Description Including pain rating scale, medication(s)/side effects and non-pharmacologic comfort measures Outcome: Progressing

## 2024-04-09 NOTE — Progress Notes (Signed)
 PT Cancellation Note  Patient Details Name: Darin Hopkins MRN: [id number] DOB: 1960-05-17   Cancelled Treatment:     Pt resting in bed, attempted to see pt for skilled PT session who refused any activity despite education on importance/benefits. Will re-attempt at a later date per POC.    [name] 04/09/2024, 1:13 PM

## 2024-04-09 NOTE — Progress Notes (Signed)
 PROGRESS NOTE    Darin Hopkins  FMW:[id number] DOB: 06/13/1960 DOA: 04/03/2024 PCP: [name], NP    Brief Narrative:  63 y.o. male with a PMH significant for COPD, HTN, left inguinal hernia, GERD, CHF, history of seizure, HLD, alcohol dependence, protein calorie malnutrition, CKD 3.   They presented from home to the ED on 04/03/2024 with concerns for considerable weakness x 2 days.  Per EMS report, patient's O2 saturations on their initial evaluation were in the 50s which appears to be due to the fact that his oxygen tank was empty and chronically is supposed to be on 2 to 3 L.  They also reported significant bedbugs in his home.  He was put on home oxygen with significant improvement.   In the ED, it was found that they had stable vital signs on 2 L nasal cannula.  It appears that patient was put on BiPAP at some point earlier this morning but there is no documented desaturation or reason for this and when I evaluated patient he was stable and comfortable so transitioned back to his home nasal cannula oxygen.  Significant findings included: Essentially pan CT scanned in the ED with several findings:   -Possible left lower lobe pneumonia on chest CT.   -Large left groin hernia without obstruction or incarceration. -Abnormal findings in the pancreas on CT scan were concerning for mass but not fully evaluated so radiology recommended abdominal MRI to further evaluate. -Also has mass on lower pole of right kidney which is consistent with renal cell carcinoma and can also be further evaluated with the MRI. -Additionally, multiple nodules were seen in the right lower lobe of his lungs which is recommended to have noncontrast chest CT follow-up in 3 to 6 months.   They were initially treated with Mucinex , DuoNeb, Solu-Medrol , ceftriaxone , doxycycline .    Patient was admitted to medicine service for further workup and management of hypoxia as outlined in detail below.   Assessment &  Plan:   Principal Problem:   COPD exacerbation (HCC) Active Problems:   Left inguinal hernia  Left lower lobe pneumonia as seen on chest CT  hypoxia- due to his home oxygen running out. He is now currently at 100% SpO2 on 2L nasal cannula which is his home oxygen requirement.  No wheezing on exam, no productive cough or rhinorrhea reported. Plan: Completing 5-day course of ceftriaxone  today Continue to encourage incentive spirometry Continue base rate 2 L oxygen Patient is now medically ready for discharge  Mild hyperkalemia Monitor and replace as necessary   Inguinal hernia-evaluated by general surgery and is not urgently surgical.  Patient denies any current discomfort. No incarceration nor obstruction on CT.  He can follow-up outpatient with surgery if he and family decide to pursue this - appreciate surgery recs - monitor bowel movements    Mass of pancreas, right kidney (consistent with RCC)-suspicious of malignancy and seen incidentally on abdominal CT scan.  Radiology recommendations include follow-up MRI to further characterize. MRI performed.  Moderate pancreatic duct dilatation with small mass in the pancreas.  Radiology report recommends tissue sampling. Plan: Palliative care engaged.  At this time patient is undecided whether he wants to pursue aggressive treatments.  CA 19-9 is essentially within reference range speaking away from pancreatic adenocarcinoma.  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  This may require endoscopic ultrasound for definitive diagnosis however this is not a urgent need this procedures are only offered and [facility] or in a tertiary care facility  At time of discharge I will refer to gastroenterology at [facility] in [place] for one of their physicians who performs endoscopic ultrasound to be able to evaluate as outpatient    COPD-  - continue PRN breathing treatments   HTN- hold home meds since he has lower-normal BPs   Seizure disorder -  continue home keppra    DVT prophylaxis: SQL Code Status: Full Family Communication: Niece via phone 11/29, sister at bedside 12/1 Disposition Plan: Status is: Inpatient Remains inpatient appropriate because: Multiple issues as above   Level of care: Med-Surg  Consultants:  Palliative care  Procedures:  None  Antimicrobials: Rocephin    Subjective: Seen and examined.  Feels well.  Lying in bed.  No distress.  Objective: Vitals:   04/08/24 2039 04/08/24 2143 04/09/24 0451 04/09/24 0614  BP: (!) 164/96 (!) 151/87 (!) 159/94 (!) 156/87  Pulse: 88 79 69 68  Resp: 16  16   Temp: 97.9 F (36.6 C)  97.9 F (36.6 C)   TempSrc:      SpO2: 92%  97%   Weight:      Height:        Intake/Output Summary (Last 24 hours) at 04/09/2024 1038 Last data filed at 04/09/2024 1030 Gross per 24 hour  Intake 334.59 ml  Output 650 ml  Net -315.41 ml   Filed Weights   04/03/24 0341 04/03/24 2236  Weight: 51.7 kg 53.4 kg    Examination:  General exam: NAD Respiratory system: Mild crackles left base.  Normal work of breathing.  2 L Cardiovascular system: S1 S2, RRR, no murmurs, no pedal edema Gastrointestinal system: Thin, soft, NT/ND, normal bowel sounds Central nervous system: Alert and oriented. No focal neurological deficits. Extremities: Symmetric 5 x 5 power. Skin: No rashes, lesions or ulcers Psychiatry: Judgement and insight appear normal. Mood & affect appropriate.     Data Reviewed: I have personally reviewed following labs and imaging studies  CBC: Recent Labs  Lab 04/03/24 0358 04/04/24 0447 04/05/24 0951 04/07/24 1130 04/09/24 0927  WBC 11.7* 7.6 12.3* 11.7* 12.6*  NEUTROABS 6.3  --  3.9 4.8 PENDING  HGB 7.9* 6.5* 7.7* 7.6* 7.5*  HCT 28.0* 22.1* 25.9* 25.8* 25.8*  MCV 97.2 93.6 93.5 93.5 94.2  PLT 180 169 206 231 236   Basic Metabolic Panel: Recent Labs  Lab 04/03/24 0359 04/04/24 0447 04/05/24 0951 04/07/24 1130 04/09/24 0927  NA 145 145 145 148*  150*  K 5.2* 5.1 5.0 4.7 4.9  CL 107 110 109 111 112*  CO2 27 29 30  33* 33*  GLUCOSE 135* 99 101* 178* 84  BUN 47* 55* 52* 35* 34*  CREATININE 2.77* 2.53* 2.23* 1.78* 1.89*  CALCIUM  8.7* 8.5* 8.7* 9.1 9.0   GFR: Estimated Creatinine Clearance: 30.2 mL/min (A) (by C-G formula based on SCr of 1.89 mg/dL (H)). Liver Function Tests: Recent Labs  Lab 04/03/24 0359 04/04/24 0447  AST 17 11*  ALT 7 6  ALKPHOS 62 47  BILITOT 0.4 0.3  PROT 9.3* 7.8  ALBUMIN 3.9 3.2*   Recent Labs  Lab 04/03/24 0625  LIPASE 166*   No results for input(s): AMMONIA in the last 168 hours. Coagulation Profile: Recent Labs  Lab 04/03/24 0358  INR 1.1   Cardiac Enzymes: Recent Labs  Lab 04/03/24 0359  CKTOTAL 76   BNP (last 3 results) Recent Labs    04/03/24 0359  PROBNP 3,058.0*   HbA1C: No results for input(s): HGBA1C in the last 72 hours. CBG: No  results for input(s): GLUCAP in the last 168 hours. Lipid Profile: No results for input(s): CHOL, HDL, LDLCALC, TRIG, CHOLHDL, LDLDIRECT in the last 72 hours. Thyroid  Function Tests: No results for input(s): TSH, T4TOTAL, FREET4, T3FREE, THYROIDAB in the last 72 hours.  Anemia Panel: No results for input(s): VITAMINB12, FOLATE, FERRITIN, TIBC, IRON, RETICCTPCT in the last 72 hours. Sepsis Labs: Recent Labs  Lab 04/03/24 9185  LATICACIDVEN 0.6    Recent Results (from the past 240 hours)  Resp panel by RT-PCR (RSV, Flu A&B, Covid) Anterior Nasal Swab     Status: None   Collection Time: 04/03/24  3:59 AM   Specimen: Anterior Nasal Swab  Result Value Ref Range Status   SARS Coronavirus 2 by RT PCR NEGATIVE NEGATIVE Final    Comment: (NOTE) SARS-CoV-2 target nucleic acids are NOT DETECTED.  The SARS-CoV-2 RNA is generally detectable in upper respiratory specimens during the acute phase of infection. The lowest concentration of SARS-CoV-2 viral copies this assay can detect is 138 copies/mL. A  negative result does not preclude SARS-Cov-2 infection and should not be used as the sole basis for treatment or other patient management decisions. A negative result may occur with  improper specimen collection/handling, submission of specimen other than nasopharyngeal swab, presence of viral mutation(s) within the areas targeted by this assay, and inadequate number of viral copies(<138 copies/mL). A negative result must be combined with clinical observations, patient history, and epidemiological information. The expected result is Negative.  Fact Sheet for Patients:  bloggercourse.com  Fact Sheet for Healthcare Providers:  seriousbroker.it  This test is no t yet approved or cleared by the [place]  FDA and  has been authorized for detection and/or diagnosis of SARS-CoV-2 by FDA under an Emergency Use Authorization (EUA). This EUA will remain  in effect (meaning this test can be used) for the duration of the COVID-19 declaration under Section 564(b)(1) of the Act, 21 U.S.C.section 360bbb-3(b)(1), unless the authorization is terminated  or revoked sooner.       Influenza A by PCR NEGATIVE NEGATIVE Final   Influenza B by PCR NEGATIVE NEGATIVE Final    Comment: (NOTE) The Xpert Xpress SARS-CoV-2/FLU/RSV plus assay is intended as an aid in the diagnosis of influenza from Nasopharyngeal swab specimens and should not be used as a sole basis for treatment. Nasal washings and aspirates are unacceptable for Xpert Xpress SARS-CoV-2/FLU/RSV testing.  Fact Sheet for Patients: bloggercourse.com  Fact Sheet for Healthcare Providers: seriousbroker.it  This test is not yet approved or cleared by the [place]  FDA and has been authorized for detection and/or diagnosis of SARS-CoV-2 by FDA under an Emergency Use Authorization (EUA). This EUA will remain in effect (meaning this test can  be used) for the duration of the COVID-19 declaration under Section 564(b)(1) of the Act, 21 U.S.C. section 360bbb-3(b)(1), unless the authorization is terminated or revoked.     Resp Syncytial Virus by PCR NEGATIVE NEGATIVE Final    Comment: (NOTE) Fact Sheet for Patients: bloggercourse.com  Fact Sheet for Healthcare Providers: seriousbroker.it  This test is not yet approved or cleared by the [place]  FDA and has been authorized for detection and/or diagnosis of SARS-CoV-2 by FDA under an Emergency Use Authorization (EUA). This EUA will remain in effect (meaning this test can be used) for the duration of the COVID-19 declaration under Section 564(b)(1) of the Act, 21 U.S.C. section 360bbb-3(b)(1), unless the authorization is terminated or revoked.  Performed at [facility], [address] [place], [place]  [address]          Radiology Studies: No results found.       Scheduled Meds:  enoxaparin  (LOVENOX ) injection  40 mg Subcutaneous Q24H   guaiFENesin   600 mg Oral BID   influenza vac split trivalent PF  0.5 mL Intramuscular Tomorrow-1000   levETIRAcetam   750 mg Oral BID   pneumococcal 20-valent conjugate vaccine  0.5 mL Intramuscular Tomorrow-1000   traZODone   50 mg Oral QHS   umeclidinium bromide   1 puff Inhalation Daily   Continuous Infusions:     LOS: 6 days      Calvin KATHEE Robson, MD [facility]   If 7PM-7AM, please contact night-coverage  04/09/2024, 10:38 AM

## 2024-04-09 NOTE — Progress Notes (Signed)
 OT Cancellation Note  Patient Details Name: Darin Hopkins MRN: [id number] DOB: [birth year]   Cancelled Treatment:    Reason Eval/Treat Not Completed: Patient declined, no reason specified. Pt declining therapy today, will re-attempt at later date/time as able.  Tempie Gibeault R., MPH, MS, OTR/L ascom [phone] 04/09/24, 2:20 PM

## 2024-04-10 DIAGNOSIS — I509 Heart failure, unspecified: Secondary | ICD-10-CM | POA: Diagnosis not present

## 2024-04-10 DIAGNOSIS — J449 Chronic obstructive pulmonary disease, unspecified: Secondary | ICD-10-CM | POA: Diagnosis not present

## 2024-04-10 DIAGNOSIS — E43 Unspecified severe protein-calorie malnutrition: Secondary | ICD-10-CM

## 2024-04-10 DIAGNOSIS — Z515 Encounter for palliative care: Secondary | ICD-10-CM | POA: Diagnosis not present

## 2024-04-10 DIAGNOSIS — J441 Chronic obstructive pulmonary disease with (acute) exacerbation: Secondary | ICD-10-CM | POA: Diagnosis not present

## 2024-04-10 LAB — CBC WITH DIFFERENTIAL/PLATELET
Abs Immature Granulocytes: 0.02 K/uL (ref 0.00–0.07)
Basophils Absolute: 0 K/uL (ref 0.0–0.1)
Basophils Relative: 0 %
Eosinophils Absolute: 0.3 K/uL (ref 0.0–0.5)
Eosinophils Relative: 3 %
HCT: 27.4 % — ABNORMAL LOW (ref 39.0–52.0)
Hemoglobin: 8 g/dL — ABNORMAL LOW (ref 13.0–17.0)
Immature Granulocytes: 0 %
Lymphocytes Relative: 59 %
Lymphs Abs: 6.4 K/uL — ABNORMAL HIGH (ref 0.7–4.0)
MCH: 27.4 pg (ref 26.0–34.0)
MCHC: 29.2 g/dL — ABNORMAL LOW (ref 30.0–36.0)
MCV: 93.8 fL (ref 80.0–100.0)
Monocytes Absolute: 0.4 K/uL (ref 0.1–1.0)
Monocytes Relative: 4 %
Neutro Abs: 3.7 K/uL (ref 1.7–7.7)
Neutrophils Relative %: 34 %
Platelets: 228 K/uL (ref 150–400)
RBC: 2.92 MIL/uL — ABNORMAL LOW (ref 4.22–5.81)
RDW: 12.9 % (ref 11.5–15.5)
Smear Review: NORMAL
WBC: 10.9 K/uL — ABNORMAL HIGH (ref 4.0–10.5)
nRBC: 0 % (ref 0.0–0.2)

## 2024-04-10 LAB — CREATININE, SERUM
Creatinine, Ser: 1.92 mg/dL — ABNORMAL HIGH (ref 0.61–1.24)
GFR, Estimated: 39 mL/min — ABNORMAL LOW (ref >60)

## 2024-04-10 LAB — BASIC METABOLIC PANEL WITH GFR
Anion gap: 6 (ref 5–15)
BUN: 35 mg/dL — ABNORMAL HIGH (ref 8–23)
CO2: 33 mmol/L — ABNORMAL HIGH (ref 22–32)
Calcium: 9.2 mg/dL (ref 8.9–10.3)
Chloride: 108 mmol/L (ref 98–111)
Creatinine, Ser: 1.95 mg/dL — ABNORMAL HIGH (ref 0.61–1.24)
GFR, Estimated: 38 mL/min — ABNORMAL LOW (ref >60)
Glucose, Bld: 193 mg/dL — ABNORMAL HIGH (ref 70–99)
Potassium: 5.3 mmol/L — ABNORMAL HIGH (ref 3.5–5.1)
Sodium: 147 mmol/L — ABNORMAL HIGH (ref 135–145)

## 2024-04-10 MED ORDER — DEXTROSE-SODIUM CHLORIDE 5-0.45 % IV SOLN: INTRAVENOUS | Status: DC

## 2024-04-10 MED ORDER — ENOXAPARIN SODIUM 30 MG/0.3ML IJ SOSY
30.0000 mg | PREFILLED_SYRINGE | INTRAMUSCULAR | Status: DC
  Administered 2024-04-10 – 2024-04-11 (×2): 30 mg via SUBCUTANEOUS
  Filled 2024-04-10 (×3): qty 0.3

## 2024-04-10 NOTE — Progress Notes (Signed)
 PROGRESS NOTE    Darin Hopkins  FMW:[id number] DOB: [birth year] DOA: 04/03/2024 PCP: [name], NP    Brief Narrative:  63 y.o. male with a PMH significant for COPD, HTN, left inguinal hernia, GERD, CHF, history of seizure, HLD, alcohol dependence, protein calorie malnutrition, CKD 3.   They presented from home to the ED on 04/03/2024 with concerns for considerable weakness x 2 days.  Per EMS report, patient's O2 saturations on their initial evaluation were in the 50s which appears to be due to the fact that his oxygen tank was empty and chronically is supposed to be on 2 to 3 L.  They also reported significant bedbugs in his home.  He was put on home oxygen with significant improvement.   In the ED, it was found that they had stable vital signs on 2 L nasal cannula.  It appears that patient was put on BiPAP at some point earlier this morning but there is no documented desaturation or reason for this and when I evaluated patient he was stable and comfortable so transitioned back to his home nasal cannula oxygen.  Significant findings included: Essentially pan CT scanned in the ED with several findings:   -Possible left lower lobe pneumonia on chest CT.   -Large left groin hernia without obstruction or incarceration. -Abnormal findings in the pancreas on CT scan were concerning for mass but not fully evaluated so radiology recommended abdominal MRI to further evaluate. -Also has mass on lower pole of right kidney which is consistent with renal cell carcinoma and can also be further evaluated with the MRI. -Additionally, multiple nodules were seen in the right lower lobe of his lungs which is recommended to have noncontrast chest CT follow-up in 3 to 6 months.   They were initially treated with Mucinex , DuoNeb, Solu-Medrol , ceftriaxone , doxycycline .    Patient was admitted to medicine service for further workup and management of hypoxia as outlined in detail below.   Assessment &  Plan:   Principal Problem:   COPD exacerbation (HCC) Active Problems:   Left inguinal hernia  Left lower lobe pneumonia as seen on chest CT  hypoxia- due to his home oxygen running out. He is now currently at 100% SpO2 on 2L nasal cannula which is his home oxygen requirement.  No wheezing on exam, no productive cough or rhinorrhea reported. Plan: Has completed 5-day course of ceftriaxone .  Currently on base rate 2 L nasal cannula.  Continue to encourage incentive spirometry.  Encourage ambulation.  Monitor vital signs and fever curve.  No indication for further antibiotics.  Patient is medically stable for discharge and will be dispositioned to skilled nursing facility  Mild hyperkalemia Monitor and replace as necessary   Inguinal hernia-evaluated by general surgery and is not urgently surgical.  Patient denies any current discomfort. No incarceration nor obstruction on CT.  He can follow-up outpatient with surgery if he and family decide to pursue this - appreciate surgery recs - monitor bowel movements    Mass of pancreas, right kidney (consistent with RCC)-suspicious of malignancy and seen incidentally on abdominal CT scan.  Radiology recommendations include follow-up MRI to further characterize. MRI performed.  Moderate pancreatic duct dilatation with small mass in the pancreas.  Radiology report recommends tissue sampling. Plan: Palliative care engaged.  At this time patient is undecided whether he wants to pursue aggressive treatments.  CA 19-9 is essentially within reference range speaking away from pancreatic adenocarcinoma.  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  This may require  endoscopic ultrasound for definitive diagnosis however this is not a urgent need this procedures are only offered and [facility] or in a tertiary care facility  At time of discharge I will refer to gastroenterology at [place] in  for one of their physicians who performs endoscopic ultrasound  to be able to evaluate as outpatient.  Ambulatory referral placed to Dr. Wilhelmenia in [place].    COPD-  - continue PRN breathing treatments   HTN- hold home meds since he has lower-normal BPs   Seizure disorder - continue home keppra    DVT prophylaxis: SQL Code Status: Full Family Communication: Niece via phone 11/29, sister at bedside 12/1 Disposition Plan: Status is: Inpatient Remains inpatient appropriate because: Multiple issues as above   Level of care: Med-Surg  Consultants:  Palliative care  Procedures:  None  Antimicrobials: Rocephin    Subjective: Seen and examined.  No distress.  Objective: Vitals:   04/09/24 1532 04/09/24 2044 04/10/24 0400 04/10/24 0805  BP: (!) 156/87 (!) 176/82 (!) 172/95 (!) 167/85  Pulse: 69 63 68 67  Resp: 19 18 16 16   Temp: 98.7 F (37.1 C) 98.1 F (36.7 C) 98.2 F (36.8 C) 98.5 F (36.9 C)  TempSrc:    Oral  SpO2: 99% 99% 100% 100%  Weight:      Height:        Intake/Output Summary (Last 24 hours) at 04/10/2024 1342 Last data filed at 04/10/2024 1019 Gross per 24 hour  Intake 480 ml  Output 1150 ml  Net -670 ml   Filed Weights   04/03/24 0341 04/03/24 2236  Weight: 51.7 kg 53.4 kg    Examination:  General exam: No acute distress Respiratory system: Mild crackles at left base.  No work of breathing.  2 L Cardiovascular system: S1 S2, RRR, no murmurs, no pedal edema Gastrointestinal system: Thin, soft, NT/ND, normal bowel sounds Central nervous system: Alert and oriented. No focal neurological deficits. Extremities: Symmetric 5 x 5 power. Skin: No rashes, lesions or ulcers Psychiatry: Judgement and insight appear normal. Mood & affect appropriate.     Data Reviewed: I have personally reviewed following labs and imaging studies  CBC: Recent Labs  Lab 04/04/24 0447 04/05/24 0951 04/07/24 1130 04/09/24 0927 04/10/24 0934  WBC 7.6 12.3* 11.7* 12.6* 10.9*  NEUTROABS  --  3.9 4.8 3.8 3.7  HGB 6.5* 7.7* 7.6*  7.5* 8.0*  HCT 22.1* 25.9* 25.8* 25.8* 27.4*  MCV 93.6 93.5 93.5 94.2 93.8  PLT 169 206 231 236 228   Basic Metabolic Panel: Recent Labs  Lab 04/04/24 0447 04/05/24 0951 04/07/24 1130 04/09/24 0927 04/10/24 0425 04/10/24 0934  NA 145 145 148* 150*  --  147*  K 5.1 5.0 4.7 4.9  --  5.3*  CL 110 109 111 112*  --  108  CO2 29 30 33* 33*  --  33*  GLUCOSE 99 101* 178* 84  --  193*  BUN 55* 52* 35* 34*  --  35*  CREATININE 2.53* 2.23* 1.78* 1.89* 1.92* 1.95*  CALCIUM  8.5* 8.7* 9.1 9.0  --  9.2   GFR: Estimated Creatinine Clearance: 29.3 mL/min (A) (by C-G formula based on SCr of 1.95 mg/dL (H)). Liver Function Tests: Recent Labs  Lab 04/04/24 0447  AST 11*  ALT 6  ALKPHOS 47  BILITOT 0.3  PROT 7.8  ALBUMIN 3.2*   No results for input(s): LIPASE, AMYLASE in the last 168 hours.  No results for input(s): AMMONIA in the last 168  hours. Coagulation Profile: No results for input(s): INR, PROTIME in the last 168 hours.  Cardiac Enzymes: No results for input(s): CKTOTAL, CKMB, CKMBINDEX, TROPONINI in the last 168 hours.  BNP (last 3 results) Recent Labs    04/03/24 0359  PROBNP 3,058.0*   HbA1C: No results for input(s): HGBA1C in the last 72 hours. CBG: No results for input(s): GLUCAP in the last 168 hours. Lipid Profile: No results for input(s): CHOL, HDL, LDLCALC, TRIG, CHOLHDL, LDLDIRECT in the last 72 hours. Thyroid  Function Tests: No results for input(s): TSH, T4TOTAL, FREET4, T3FREE, THYROIDAB in the last 72 hours.  Anemia Panel: No results for input(s): VITAMINB12, FOLATE, FERRITIN, TIBC, IRON, RETICCTPCT in the last 72 hours. Sepsis Labs: No results for input(s): PROCALCITON, LATICACIDVEN in the last 168 hours.   Recent Results (from the past 240 hours)  Resp panel by RT-PCR (RSV, Flu A&B, Covid) Anterior Nasal Swab     Status: None   Collection Time: 04/03/24  3:59 AM   Specimen: Anterior  Nasal Swab  Result Value Ref Range Status   SARS Coronavirus 2 by RT PCR NEGATIVE NEGATIVE Final    Comment: (NOTE) SARS-CoV-2 target nucleic acids are NOT DETECTED.  The SARS-CoV-2 RNA is generally detectable in upper respiratory specimens during the acute phase of infection. The lowest concentration of SARS-CoV-2 viral copies this assay can detect is 138 copies/mL. A negative result does not preclude SARS-Cov-2 infection and should not be used as the sole basis for treatment or other patient management decisions. A negative result may occur with  improper specimen collection/handling, submission of specimen other than nasopharyngeal swab, presence of viral mutation(s) within the areas targeted by this assay, and inadequate number of viral copies(<138 copies/mL). A negative result must be combined with clinical observations, patient history, and epidemiological information. The expected result is Negative.  Fact Sheet for Patients:  bloggercourse.com  Fact Sheet for Healthcare Providers:  seriousbroker.it  This test is no t yet approved or cleared by the [place]  FDA and  has been authorized for detection and/or diagnosis of SARS-CoV-2 by FDA under an Emergency Use Authorization (EUA). This EUA will remain  in effect (meaning this test can be used) for the duration of the COVID-19 declaration under Section 564(b)(1) of the Act, 21 U.S.C.section 360bbb-3(b)(1), unless the authorization is terminated  or revoked sooner.       Influenza A by PCR NEGATIVE NEGATIVE Final   Influenza B by PCR NEGATIVE NEGATIVE Final    Comment: (NOTE) The Xpert Xpress SARS-CoV-2/FLU/RSV plus assay is intended as an aid in the diagnosis of influenza from Nasopharyngeal swab specimens and should not be used as a sole basis for treatment. Nasal washings and aspirates are unacceptable for Xpert Xpress SARS-CoV-2/FLU/RSV testing.  Fact Sheet for  Patients: bloggercourse.com  Fact Sheet for Healthcare Providers: seriousbroker.it  This test is not yet approved or cleared by the [place]  FDA and has been authorized for detection and/or diagnosis of SARS-CoV-2 by FDA under an Emergency Use Authorization (EUA). This EUA will remain in effect (meaning this test can be used) for the duration of the COVID-19 declaration under Section 564(b)(1) of the Act, 21 U.S.C. section 360bbb-3(b)(1), unless the authorization is terminated or revoked.     Resp Syncytial Virus by PCR NEGATIVE NEGATIVE Final    Comment: (NOTE) Fact Sheet for Patients: bloggercourse.com  Fact Sheet for Healthcare Providers: seriousbroker.it  This test is not yet approved or cleared by the [place]  FDA and has been authorized for  detection and/or diagnosis of SARS-CoV-2 by FDA under an Emergency Use Authorization (EUA). This EUA will remain in effect (meaning this test can be used) for the duration of the COVID-19 declaration under Section 564(b)(1) of the Act, 21 U.S.C. section 360bbb-3(b)(1), unless the authorization is terminated or revoked.  Performed at [facility], [address], [place], [place] [address]          Radiology Studies: No results found.       Scheduled Meds:  enoxaparin  (LOVENOX ) injection  30 mg Subcutaneous Q24H   guaiFENesin   600 mg Oral BID   influenza vac split trivalent PF  0.5 mL Intramuscular Tomorrow-1000   levETIRAcetam   750 mg Oral BID   pneumococcal 20-valent conjugate vaccine  0.5 mL Intramuscular Tomorrow-1000   traZODone   50 mg Oral QHS   umeclidinium bromide   1 puff Inhalation Daily   Continuous Infusions:  dextrose  5 % and 0.45 % NaCl 75 mL/hr at 04/10/24 1019      LOS: 7 days      Calvin KATHEE Robson, MD [facility]   If 7PM-7AM, please contact  night-coverage  04/10/2024, 1:42 PM

## 2024-04-10 NOTE — Progress Notes (Signed)
 PHARMACIST - PHYSICIAN COMMUNICATION  CONCERNING:  Enoxaparin  (Lovenox ) for DVT Prophylaxis    RECOMMENDATION: Patient was prescribed enoxaprin 40mg  q24 hours for VTE prophylaxis.   Filed Weights   04/03/24 0341 04/03/24 2236  Weight: 51.7 kg (114 lb) 53.4 kg (117 lb 11.6 oz)    Body mass index is 18.44 kg/m.  Estimated Creatinine Clearance: 29.7 mL/min (A) (by C-G formula based on SCr of 1.92 mg/dL (H)).  Patient is candidate for enoxaparin  30mg  every 24 hours based on CrCl <66ml/min or Weight <45kg  DESCRIPTION: Pharmacy has adjusted enoxaparin  dose per [facility] policy.  Patient is now receiving enoxaparin  30 mg every 24 hours    Lum VEAR Mania, PharmD Clinical Pharmacist  04/10/2024 7:56 AM

## 2024-04-10 NOTE — Progress Notes (Addendum)
 Physical Therapy Treatment Patient Details Name: Darin Hopkins MRN: [id number] DOB: [birth year] Today's Date: 04/10/2024   History of Present Illness presented to ER secondary to progressive weakness, hypoxia (noted with O2 tank empty); admitted for management of COPD exacerbation.    PT Comments  Pt received in bed for co-tx with OT due to occasional increased assist and pt low tolerance for activity at times. Pt required education on importance of OOB activity and participation in acute therapy services. Once explained and understood, pt was very cooperative and completed all desired tasks. Significant desaturation after utilizing BSC and sitting EOB for several minutes. Pt dropped to 79% on 3L O2 from previous resting rate of 93%. Cues for PLB and relaxation techniques, pt able to recover back to mid 90's with O2 briefly bumped to 5L. Pt will benefit from STR at d/c prior to returning home.    If plan is discharge home, recommend the following: A little help with walking and/or transfers;A little help with bathing/dressing/bathroom   Can travel by private vehicle     Yes  Equipment Recommendations  Rolling walker (2 wheels)    Recommendations for Other Services       Precautions / Restrictions Precautions Precautions: Fall Recall of Precautions/Restrictions: Impaired     Mobility  Bed Mobility Overal bed mobility: Modified Independent             General bed mobility comments: HOB raised    Transfers Overall transfer level: Needs assistance Equipment used: Rolling walker (2 wheels) Transfers: Sit to/from Stand, Bed to chair/wheelchair/BSC Sit to Stand: Contact guard assist   Step pivot transfers: Contact guard assist            Ambulation/Gait Ambulation/Gait assistance: Contact guard assist Gait Distance (Feet):  (3) Assistive device: Rolling walker (2 wheels) Gait Pattern/deviations: Step-to pattern, Staggering left, Staggering right, Trunk flexed,  Decreased step length - right, Decreased step length - left Gait velocity:  (decr)     General Gait Details:  (Pt fatigued after using BSC, limited gait tolerance. SpO2 dropped to 79% on 3L during minimal exertion)   Stairs             Wheelchair Mobility     Tilt Bed    Modified Rankin (Stroke Patients Only)       Balance Overall balance assessment: Needs assistance Sitting-balance support: No upper extremity supported, Feet supported Sitting balance-Leahy Scale: Good     Standing balance support: Bilateral upper extremity supported, During functional activity, Reliant on assistive device for balance Standing balance-Leahy Scale: Fair Standing balance comment:  (RW needed for safe gait, able to stand and reach out of BOS with single UE support)                            Communication Communication Communication: Impaired Factors Affecting Communication: Hearing impaired  Cognition Arousal: Alert Behavior During Therapy: WFL for tasks assessed/performed                           PT - Cognition Comments: Oriented to basic information, follows simple commands; mild processing delays; cooperative with session, but does require min encouragement from therapist for full participation Following commands: Impaired Following commands impaired: Only follows one step commands consistently, Follows multi-step commands with increased time    Cueing Cueing Techniques: Verbal cues, Tactile cues  Exercises      General Comments General comments (skin  integrity, edema, etc.):  (Pt required 2 minute rest break  with cues for PLB in order to return to 93%)      Pertinent Vitals/Pain Pain Assessment Pain Assessment: No/denies pain    Home Living                          Prior Function            PT Goals (current goals can now be found in the care plan section) Acute Rehab PT Goals Patient Stated Goal: to return home Progress towards  PT goals: Progressing toward goals    Frequency    Min 2X/week      PT Plan      Co-evaluation   Reason for Co-Treatment: To address functional/ADL transfers;For patient/therapist safety PT goals addressed during session: Mobility/safety with mobility OT goals addressed during session: ADL's and self-care      AM-PAC PT 6 Clicks Mobility   Outcome Measure  Help needed turning from your back to your side while in a flat bed without using bedrails?: None Help needed moving from lying on your back to sitting on the side of a flat bed without using bedrails?: None Help needed moving to and from a bed to a chair (including a wheelchair)?: None Help needed standing up from a chair using your arms (e.g., wheelchair or bedside chair)?: A Little Help needed to walk in hospital room?: A Little Help needed climbing 3-5 steps with a railing? : A Little 6 Click Score: 21    End of Session   Activity Tolerance: Patient tolerated treatment well Patient left: in bed;with call bell/phone within reach;with bed alarm set Nurse Communication: Mobility status PT Visit Diagnosis: Muscle weakness (generalized) (M62.81);Difficulty in walking, not elsewhere classified (R26.2)     Time: 8555-8490 PT Time Calculation (min) (ACUTE ONLY): 25 min  Charges:    $Therapeutic Activity: 8-22 mins PT General Charges $$ ACUTE PT VISIT: 1 Visit                    Darice Bohr, PTA  [name] 04/10/2024, 3:38 PM

## 2024-04-10 NOTE — Progress Notes (Signed)
 [facility] CENTER  PROCEDURAL EXPEDITER PROGRESS NOTE  Patient Name: Darin Hopkins  DOB:January 29, 1961 Date of Admission: 04/03/2024  Date of Assessment:04/10/24   -------------------------------------------------------------------------------------------------------------------  Going to SNF  -------------------------------------------------------------------------------------------------------------------  [facility], Ronal DELENA Bald Please contact us  directly via secure chat (search for [facility]) or by calling us  at [phone] [facility]).

## 2024-04-10 NOTE — Progress Notes (Signed)
 Occupational Therapy Treatment Patient Details Name: Darin Hopkins MRN: [id number] DOB: Dec 08, 1960 Today's Date: 04/10/2024   History of present illness presented to ER secondary to progressive weakness, hypoxia (noted with O2 tank empty); admitted for management of COPD exacerbation.   OT comments  Pt seen for OT tx, co-tx with PT for safety and due to pt's poor tolerance for multiple sessions. Pt agreeable with some encouragement. Pt mod indep with bed mobility, stood with CGA with RW, and step pivot to [facility]. Pt set up for grooming tasks. Pt then reported need for BM. VC for PLB throughout session. Pt required MIN A for thoroughness with standing pericare. Once returned to bed, SpO2 noted to desat to 79-80% on 3L. Bumped to 5L to support recovery. Left on 3L, MD, RN notified. Pt appreciative of session. Will continue to progress.       If plan is discharge home, recommend the following:  A little help with walking and/or transfers;A little help with bathing/dressing/bathroom;Supervision due to cognitive status;Assistance with cooking/housework;Assist for transportation;Help with stairs or ramp for entrance   Equipment Recommendations  None recommended by OT    Recommendations for Other Services      Precautions / Restrictions Precautions Precautions: Fall Recall of Precautions/Restrictions: Impaired Restrictions Weight Bearing Restrictions Per Provider Order: No       Mobility Bed Mobility Overal bed mobility: Modified Independent             General bed mobility comments: HOB raised    Transfers Overall transfer level: Needs assistance Equipment used: Rolling walker (2 wheels) Transfers: Sit to/from Stand, Bed to chair/wheelchair/BSC Sit to Stand: Contact guard assist     Step pivot transfers: Contact guard assist           Balance Overall balance assessment: Needs assistance Sitting-balance support: No upper extremity supported, Feet supported Sitting  balance-Leahy Scale: Good     Standing balance support: Single extremity supported, During functional activity Standing balance-Leahy Scale: Fair Standing balance comment: able to use LUE on RW, RUE to complete pericare                           ADL either performed or assessed with clinical judgement   ADL Overall ADL's : Needs assistance/impaired     Grooming: Sitting;Set up;Wash/dry face;Wash/dry Lawyer: Contact guard assist;BSC/3in1;Rolling walker (2 wheels);Stand-pivot Statistician Details (indicate cue type and reason): step pivot to [facility] with RW Toileting- Clothing Manipulation and Hygiene: Minimal assistance;Sit to/from stand Toileting - Clothing Manipulation Details (indicate cue type and reason): MIN A for thoroughness     Functional mobility during ADLs: Contact guard assist;Rolling walker (2 wheels)      Extremity/Trunk Assessment              Vision       Perception     Praxis     Communication Communication Communication: Impaired Factors Affecting Communication: Hearing impaired   Cognition Arousal: Alert Behavior During Therapy: WFL for tasks assessed/performed Cognition: Cognition impaired     Awareness: Online awareness impaired 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 RN, MD notified.    Pertinent Vitals/ Pain       Pain Assessment Pain Assessment: No/denies pain  Home  Living                                          Prior Functioning/Environment              Frequency  Min 2X/week        Progress Toward Goals  OT Goals(current goals can now be found in the care plan section)  Progress towards OT goals: Progressing toward goals  Acute Rehab OT Goals Patient Stated Goal: to go home OT Goal Formulation: With patient Time For Goal Achievement: 04/17/24 Potential to Achieve Goals: Good  Plan      Co-evaluation    PT/OT/SLP Co-Evaluation/Treatment: Yes Reason for Co-Treatment: To address functional/ADL transfers;For patient/therapist safety PT goals addressed during session: Mobility/safety with mobility OT goals addressed during session: ADL's and self-care      AM-PAC OT 6 Clicks Daily Activity     Outcome Measure   Help from another person eating meals?: None Help from another person taking care of personal grooming?: None Help from another person toileting, which includes using toliet, bedpan, or urinal?: A Little Help from another person bathing (including washing, rinsing, drying)?: A Little Help from another person to put on and taking off regular upper body clothing?: None Help from another person to put on and taking off regular lower body clothing?: A Little 6 Click Score: 21    End of Session Equipment Utilized During Treatment: Oxygen;Rolling walker (2 wheels)  OT Visit Diagnosis: Unsteadiness on feet (R26.81);Muscle weakness (generalized) (M62.81);History of falling (Z91.81);Other abnormalities of gait and mobility (R26.89)   Activity Tolerance Patient tolerated treatment well   Patient Left in bed;with call bell/phone within reach;with bed alarm set   Nurse Communication Mobility status;Other (comment) (O2, BM)        Time: 8555-8492 OT Time Calculation (min): 23 min  Charges: OT General Charges $OT Visit: 1 Visit OT Treatments $Self Care/Home Management : 8-22 mins  Warren SAUNDERS., MPH,  MS, OTR/L ascom [phone] 04/10/24, 3:25 PM

## 2024-04-10 NOTE — Plan of Care (Signed)
   Problem: Education: Goal: Knowledge of General Education information will improve Description Including pain rating scale, medication(s)/side effects and non-pharmacologic comfort measures Outcome: Progressing

## 2024-04-10 NOTE — TOC Progression Note (Addendum)
 Transition of Care [facility]) - Progression Note    Patient Details  Name: Darin Hopkins MRN: [id number] Date of Birth: 10/11/1960  [facility] of [name] CM/SW Contact  Alvaro Louder, [place] Phone Number: 04/10/2024, 12:24 PM  Clinical Narrative:   4:13 PM: PT and OT Notes in. Auth officially started at [facility].  3:16 PM: Facility is requesting updated PT and OT notes. They will start auth once those are in.   1:24 PM: LCSWA spoke to [employer] and confirmed that the facility is starting insurance auth.    Patient selected SNF [facility]. LCSWA called and left VM for admin coordinator Etta Ply [phone] to start insurance auth. Awaiting call back to confirm insurance shara has been started for patient.   TOC to follow for discharge  Expected Discharge Plan: Home/Self Care                 Expected Discharge Plan and Services                                               Social Drivers of Health (SDOH) Interventions SDOH Screenings   Food Insecurity: No Food Insecurity (04/03/2024)  Housing: Low Risk  (04/03/2024)  Transportation Needs: No Transportation Needs (04/03/2024)  Utilities: Not At Risk (04/03/2024)  Depression (PHQ2-9): Low Risk  (02/11/2022)  Tobacco Use: Medium Risk (04/03/2024)    Readmission Risk Interventions     No data to display

## 2024-04-10 NOTE — Progress Notes (Signed)
 Palliative Care Progress Note, Assessment & Plan   Patient Name: Darin Hopkins       Date: 04/10/2024 DOB: Jan 17, 1961  Age: 63 y.o. MRN#: [id number] Attending Physician: Jhonny Calvin NOVAK, MD Primary Care Physician: [name], NP Admit Date: 04/03/2024  Subjective: Patient is lying in bed in no apparent distress.  He is awake, alert, and able to acknowledge my presence.  No family or friends are present at bedside during my visit.  HPI: 63 y.o. male  with past medical history of COPD, HTN, left hernia, GERD, CHF, history of seizures, HLD, alcohol dependence, CKD stage III, and protein calorie malnutrition admitted on 04/03/2024 with weakness.   As per chart review, EMS reported that initial evaluation found patient's oxygen saturations to be in the 50s.  He is chronically on 2-3 L of nasal cannula but his oxygen tank was found empty.   Additionally, EMS reported significant bedbugs present in his home.   Patient was pan CT scan in the ED with several findings which included:   -Possible left lower lobe pneumonia on chest CT.   -Large left groin hernia without obstruction or incarceration. -Abnormal findings in the pancreas on CT scan were concerning for mass but not fully evaluated so radiology recommended abdominal MRI to further evaluate. -Also has mass on lower pole of right kidney which is consistent with renal cell carcinoma and can also be further evaluated with the MRI. -Additionally, multiple nodules were seen in the right lower lobe of his lungs which is recommended to have noncontrast chest CT follow-up in 3 to 6 months   She is being treated for left lower lobe pneumonia with Mucinex , DuoNeb, Solu-Medrol , ceftriaxone , and doxycycline .  Additionally, general surgery was consulted and  no urgent surgical need at this time.  Recommendation is for patient to follow-up outpatient if he wishes to pursue aggressive treatment of inguinal hernia.   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  Radiology report recommends tissue sampling. Ambulatory referral placed to [facility]. Wilhelmenia [facility] for outpatient follow up.    PMT was consulted to support patient and family with discussions on boundaries and goals of medical treatment.   Summary of counseling/coordination of care: Extensive chart review completed prior to meeting patient including labs, vital signs, imaging, progress notes, orders, and available advanced directive documents from current and previous encounters.   After reviewing the patient's chart and assessing the patient at bedside, I spoke with patient in regards to symptom management and goals of care.   Symptoms assessed.  Patient endorses mild discomfort in his stomach.  He endorses he had a bowel movement this morning.  He shares that his stomach often feels tight when he is about to have a bowel movement.  PE, no rebound tenderness noted.  No masses palpated.  Discussed significance of mobility, hydration, and bowel regimen in order to avoid constipation.  Patient denies that he is constipated but believes his stomach is sore from all of the activity I have been doing.  Reviewed that we will continue to monitor and that he should report any changes in his abdomen when  they occur.  No change to [facility] at this time.  I discussed next steps with patient.  He remains in agreement to go to skilled nursing facility for short-term rehab.  Reviewed that insurance authorization is pending.  Goals are clear.  Plan is set.  PMT will step back from daily visits as there are no acute palliative needs at this time.  Please reengage with PMT if goals change, at patient/family's request, or  patient's health deteriorates during hospitalization.  Physical Exam Vitals reviewed.  Constitutional:      Comments: thin  HENT:     Head:     Comments: Temporal wasting    Mouth/Throat:     Mouth: Mucous membranes are moist.  Eyes:     Pupils: Pupils are equal, round, and reactive to light.  Pulmonary:     Effort: Pulmonary effort is normal.  Abdominal:     Palpations: Abdomen is soft.  Skin:    General: Skin is warm and dry.  Neurological:     Mental Status: He is alert and oriented to person, place, and time.  Psychiatric:        Mood and Affect: Mood normal.        Behavior: Behavior normal.             25 minute visit includes: Detailed review of medical records (labs, imaging, vital signs), medically appropriate exam (mental status, respiratory, cardiac, skin), discussed with treatment team, counseling and educating patient, family and staff, documenting clinical information, medication management and coordination of care.  Lamarr L. Arvid, DNP, FNP-BC Palliative Medicine Team

## 2024-04-10 NOTE — Plan of Care (Signed)
   Problem: Clinical Measurements: Goal: Ability to maintain clinical measurements within normal limits will improve Outcome: Progressing

## 2024-04-11 DIAGNOSIS — N289 Disorder of kidney and ureter, unspecified: Secondary | ICD-10-CM

## 2024-04-11 DIAGNOSIS — J441 Chronic obstructive pulmonary disease with (acute) exacerbation: Secondary | ICD-10-CM | POA: Diagnosis not present

## 2024-04-11 DIAGNOSIS — E870 Hyperosmolality and hypernatremia: Secondary | ICD-10-CM

## 2024-04-11 DIAGNOSIS — R710 Precipitous drop in hematocrit: Secondary | ICD-10-CM

## 2024-04-11 DIAGNOSIS — J9611 Chronic respiratory failure with hypoxia: Secondary | ICD-10-CM

## 2024-04-11 DIAGNOSIS — N179 Acute kidney failure, unspecified: Secondary | ICD-10-CM

## 2024-04-11 DIAGNOSIS — N189 Chronic kidney disease, unspecified: Secondary | ICD-10-CM

## 2024-04-11 DIAGNOSIS — K8689 Other specified diseases of pancreas: Secondary | ICD-10-CM

## 2024-04-11 DIAGNOSIS — J9612 Chronic respiratory failure with hypercapnia: Secondary | ICD-10-CM

## 2024-04-11 DIAGNOSIS — E875 Hyperkalemia: Secondary | ICD-10-CM

## 2024-04-11 LAB — HEMOGLOBIN: Hemoglobin: 7.3 g/dL — ABNORMAL LOW (ref 13.0–17.0)

## 2024-04-11 LAB — BASIC METABOLIC PANEL WITH GFR
Anion gap: 4 — ABNORMAL LOW (ref 5–15)
BUN: 30 mg/dL — ABNORMAL HIGH (ref 8–23)
CO2: 33 mmol/L — ABNORMAL HIGH (ref 22–32)
Calcium: 8.5 mg/dL — ABNORMAL LOW (ref 8.9–10.3)
Chloride: 105 mmol/L (ref 98–111)
Creatinine, Ser: 1.72 mg/dL — ABNORMAL HIGH (ref 0.61–1.24)
GFR, Estimated: 44 mL/min — ABNORMAL LOW (ref >60)
Glucose, Bld: 103 mg/dL — ABNORMAL HIGH (ref 70–99)
Potassium: 5 mmol/L (ref 3.5–5.1)
Sodium: 141 mmol/L (ref 135–145)

## 2024-04-11 MED ORDER — TRAZODONE HCL 100 MG PO TABS
100.0000 mg | ORAL_TABLET | Freq: Every day | ORAL | Status: DC
  Administered 2024-04-11 – 2024-04-20 (×10): 100 mg via ORAL
  Filled 2024-04-11 (×10): qty 1

## 2024-04-11 MED ORDER — SODIUM ZIRCONIUM CYCLOSILICATE 10 G PO PACK
10.0000 g | PACK | Freq: Once | ORAL | Status: AC
  Administered 2024-04-11: 10 g via ORAL
  Filled 2024-04-11: qty 1

## 2024-04-11 NOTE — Hospital Course (Addendum)
 Hospital course / significant events:   HPI: 63 y.o. male with a PMH significant for COPD, HTN, left inguinal hernia, GERD, CHF, history of seizure, HLD, alcohol dependence, protein calorie malnutrition, CKD 3. He presented from home to the ED on 04/03/2024 with concerns for weakness x 2 days.  Per EMS report, patient's O2 saturations on their initial evaluation were in the 50s which appears to be due to the fact that his oxygen tank was empty and chronically is supposed to be on 2 to 3 L.  They also reported significant bedbugs in his home.  He was put on home oxygen with significant improvement.  11/25: to ED. Stable vital signs on 2 L nasal cannula.  It appears that patient was put on BiPAP at some point earlier this morning but there is no documented desaturation or reason for this and when hospitalist evaluated patient he was stable and comfortable so transitioned back to his home nasal cannula oxygen. Other:  Essentially pan CT scanned in the ED with several findings:  Possible left lower lobe pneumonia on chest CT, Large left groin hernia without obstruction or incarceration, Abnormal findings in the pancreas on CT scan were concerning for mass but not fully evaluated so radiology recommended abdominal MRI to further evaluate, mass on lower pole of right kidney, multiple lung nodules rec CT follow-up in 3 to 6 months.. He was tx with Mucinex , DuoNeb, Solu-Medrol , ceftriaxone , doxycycline . Admitted to hospitalist  MRI of the abdomen showed motion degraded exam, moderate dilatation of main pancreatic duct with and subtle soft tissue prominence suspicious for small pancreatic mass recommending EUS for further consideration, 2.6 cm complex subcapsular lesion of the right kidney considered intermediate to Bosniak category 17F lesion recommend continue follow-up by renal protocol abdomen CT with and 6 months. 12/3.  Received insurance authorization for rehab.  I ordered labs for today since potassium was high  yesterday.  Hemoglobin drifted down to 7.3, potassium 5.  Creatinine 1.72.  Will check hemoglobin again tomorrow with type and cross.  IV fluids stopped. 12/4.  Hemoglobin drifted down to 6.7.  1 unit of packed red blood cells given and hemoglobin up to 9.4.  Patient feels well after transfusion.  No shortness of breath.  Family refused going out to rehab. 12/5.  Unable to reach family today.  Will treat high potassium today. 12/6.  Hemoglobin down to 7.7.  Given IV iron .  Potassium 5.2. 12/7.  Tried to contact family about discharge.  The patient has no money because his wallet is missing.  I advised that we could have gotten medications to the [facility] pharmacy.  Patient would like his medications to go to the [place] clinic but family concerned that since he does not have an ID he will be able to get them. 12/8.  Patient feeling well.  Hemoglobin 7.9.  Give another dose of IV iron .  Creatinine 1.82 and potassium 4.7.  Lokelma  prescription went into [facility] pharmacy which was delivered.  Other prescription sent to [facility] pharmacy.  Patient able to pick up prescriptions at the [facility] pharmacy without an ID.  Patient discharged. 12/9.  Apparently hospital bed not delivered yesterday.  Spoke with TOC and sister did not schedule a delivery time for today. 12/10: pt stable for DC and WAS sent home w/ EMS but they brought him back when realized hospital bed had never acutally been delivered - see TOC notes  12/11: stable  12/12: stable.      Consultants:  Palliative  care  General surgery   Procedures/Surgeries: none      ASSESSMENT & PLAN:   Hyperkalemia Patient received Lokelma  during the hospital course.   Low potassium diet.   Lokelma  daily upon going home and this was prescribed through the [facility] pharmacy.   Anemia likely chronic inflammation Hemoglobin 6.7 --> 1 unit PRBC.   Status post IV iron  x 2 doses.    No signs of bleeding PPI Hold ASA Monitor CBC   COPD exacerbation   Chronic hypoxic resp fail on O2 at home but was out of this initially causing acute on chronic failure  Treated during hospital course Nebs as needed   Acute kidney injury superimposed on CKD Acute kidney injury on CKD stage IIIb. Creatinine peaked at 2.77.   Refer to nephrology as outpatient.  follow BMP.    Hypernatremia Sodium normalized with IV fluids.  Encourage po hydration  Monitor BMP   Pancreatic mass CA 19-9 only a few points higher than the normal range.   outpatient follow-up for endoscopic ultrasound. Of note, Dr Wilhelmenia will not be able to do the EUS outpatient, will work on referral [facility] or [facility] or [facility] or can reach out to Dr Burnette / Dr Rollin in [facility] - message sent to them and if they are unable to help will start process for outside referral.    Kidney lesion, native, right MRI showing 2.6 cm complex subcapsular lesion of the right kidney considered indeterminate  follow-up with renal protocol CT in 6 months.  With chronic kidney disease may have to repeat with an MRI.   Left inguinal hernia Seen by general surgery and not a surgical candidate currently.   Weakness We had rehab set up the other day but family refused and now they will take home with outpatient physical therapy.   Chronic diastolic CHF (congestive heart failure) No signs of congestive heart failure on this hospital course even with giving IV fluids and blood.   Can go back on Farxiga  as outpatient.   No ACE or ARB or spironolactone with acute kidney injury and hyperkalemia.   On low-dose bisoprolol .   Chronic respiratory failure with hypoxia and hypercapnia (HCC) On 3 L of oxygen.   Patient set up with portable tanks.  Already has a concentrator.   History of seizure On Keppra       underweight based on BMI: Body mass index is 18.44 kg/m.SABRA Significantly low or high BMI is associated with higher medical risk.  Underweight - under 18  overweight - 25 to 29 obese - 30 or more Class 1  obesity: BMI of 30.0 to 34 Class 2 obesity: BMI of 35.0 to 39 Class 3 obesity: BMI of 40.0 to 49 Super Morbid Obesity: BMI 50-59 Super-super Morbid Obesity: BMI 60+ Healthy nutrition and physical activity advised as adjunct to other disease management and risk reduction treatments    DVT prophylaxis: heparin  IV fluids: no continuous IV fluids  Nutrition: remal / low K diet Central lines / other devices: none  Code Status: FULL CODE ACP documentation reviewed: none on file in [facility]  TOC needs: see TOC notes  Medical barriers to dispo: none.

## 2024-04-11 NOTE — Assessment & Plan Note (Addendum)
 Patient received Lokelma  during the hospital course.  Today's potassium 5.2.  Will give Lokelma  bid.  Likely will need Lokelma  upon going home.  Low potassium diet

## 2024-04-11 NOTE — Progress Notes (Addendum)
 Progress Note   Patient: Darin Hopkins FMW:[id number] DOB: Jun 12, 1960 DOA: 04/03/2024     8 DOS: the patient was seen and examined on 04/11/2024   Brief hospital course: 63 y.o. male with a PMH significant for COPD, HTN, left inguinal hernia, GERD, CHF, history of seizure, HLD, alcohol dependence, protein calorie malnutrition, CKD 3.   They presented from home to the ED on 04/03/2024 with concerns for considerable weakness x 2 days.  Per EMS report, patient's O2 saturations on their initial evaluation were in the 50s which appears to be due to the fact that his oxygen tank was empty and chronically is supposed to be on 2 to 3 L.  They also reported significant bedbugs in his home.  He was put on home oxygen with significant improvement.   In the ED, it was found that they had stable vital signs on 2 L nasal cannula.  It appears that patient was put on BiPAP at some point earlier this morning but there is no documented desaturation or reason for this and when I evaluated patient he was stable and comfortable so transitioned back to his home nasal cannula oxygen.  Significant findings included: Essentially pan CT scanned in the ED with several findings:   -Possible left lower lobe pneumonia on chest CT.   -Large left groin hernia without obstruction or incarceration. -Abnormal findings in the pancreas on CT scan were concerning for mass but not fully evaluated so radiology recommended abdominal MRI to further evaluate. -Also has mass on lower pole of right kidney which is consistent with renal cell carcinoma and can also be further evaluated with the MRI. -Additionally, multiple nodules were seen in the right lower lobe of his lungs which is recommended to have noncontrast chest CT follow-up in 3 to 6 months.   They were initially treated with Mucinex , DuoNeb, Solu-Medrol , ceftriaxone , doxycycline .    Patient was admitted to medicine service for further workup and management of hypoxia as  outlined in detail below  12/3.  Received insurance authorization for rehab.  I ordered labs for today since potassium was high yesterday.  Hemoglobin drifted down to 7.3, potassium 5.  Creatinine 1.72.  Will check hemoglobin again tomorrow with type and cross.  IV fluids stopped.  Assessment and Plan: * Drop in hemoglobin Hemoglobin 7.3 today.  Discontinue IV fluids.  May end up needing transfusion.  COPD exacerbation (HCC) Treated  Acute kidney injury superimposed on CKD Acute kidney injury on CKD stage IIIb.  Today's creatinine 1.72 with a GFR 44.  Creatinine peaked at 2.77.  Hypernatremia Sodium normalized with IV fluids.  Discontinue fluids.  Hyperkalemia Will give Lokelma  this morning and again this evening  Pancreatic mass CA 19-9 only a few points higher than the normal range.  Recommend outpatient follow-up with Dr. Cy for endoscopic ultrasound.  Kidney lesion, native, right MRI showing 2.6 cm complex subcapsular lesion of the right kidney considered indeterminate recommend continued follow-up with renal protocol CT in 6 months.  Left inguinal hernia Seen by general surgery and not a surgical candidate currently.  Chronic respiratory failure with hypoxia and hypercapnia (HCC) On 3 L of oxygen  History of seizure On Keppra         Subjective: Patient states that his appetite is not too good.  Initially admitted with COPD exacerbation.  Physical Exam: Vitals:   04/10/24 1659 04/10/24 2028 04/11/24 0445 04/11/24 0743  BP: (!) 174/82 (!) 161/91 (!) 151/72 (!) 141/90  Pulse: 84 (!) 55 ROLLEN)  52 (!) 51  Resp: 16 16 17 19   Temp: 98.2 F (36.8 C) 98.6 F (37 C) 98.3 F (36.8 C) 97.9 F (36.6 C)  TempSrc:  Oral Oral   SpO2: 99% 100% 100% 100%  Weight:      Height:       Physical Exam HENT:     Head: Normocephalic.  Eyes:     General: Lids are normal.  Cardiovascular:     Rate and Rhythm: Normal rate and regular rhythm.     Heart sounds: Normal  heart sounds, S1 normal and S2 normal.  Pulmonary:     Breath sounds: Examination of the right-lower field reveals decreased breath sounds. Examination of the left-lower field reveals decreased breath sounds. Decreased breath sounds present. No wheezing, rhonchi or rales.  Abdominal:     Palpations: Abdomen is soft.     Tenderness: There is no abdominal tenderness.  Musculoskeletal:     Right lower leg: No swelling.     Left lower leg: No swelling.  Skin:    General: Skin is warm.     Findings: No rash.  Neurological:     Mental Status: He is alert.     Data Reviewed: Hemoglobin down to 7.3, creatinine 1.72, BUN 30, potassium 5.0  Family Communication: Spoke with niece at bedside and on the phone in the afternoon  Disposition: Status is: Inpatient Remains inpatient appropriate because: With hemoglobin dropping down to 7.3 and wanted make sure it is stable before going to rehab.  May end up needing a transfusion.  Planned Discharge Destination: Rehab    Time spent: 28 minutes  Author: Charlie Patterson, MD 04/11/2024 3:24 PM  For on call review [email or web address].

## 2024-04-11 NOTE — Assessment & Plan Note (Signed)
 Sodium normalized with IV fluids.

## 2024-04-11 NOTE — Assessment & Plan Note (Signed)
 Seen by general surgery and not a surgical candidate currently.

## 2024-04-11 NOTE — Assessment & Plan Note (Addendum)
 Acute kidney injury on CKD stage IIIb.  Today's creatinine 1.82 with a GFR 41.  Creatinine peaked at 2.77.  Refer to nephrology as outpatient.

## 2024-04-11 NOTE — Assessment & Plan Note (Addendum)
 On 3 L of oxygen.  Patient set up with portable tanks.  Already has a concentrator.

## 2024-04-11 NOTE — Assessment & Plan Note (Signed)
 CA 19-9 only a few points higher than the normal range.  Recommend outpatient follow-up with Dr. Cy for endoscopic ultrasound.

## 2024-04-11 NOTE — Assessment & Plan Note (Signed)
 Treated.

## 2024-04-11 NOTE — Assessment & Plan Note (Signed)
 On Keppra

## 2024-04-11 NOTE — Assessment & Plan Note (Addendum)
 Hemoglobin 6.7 the other day and given a unit of blood.  Status post IV iron  x 2 doses.  On Protonix  here and omeprazole  as outpatient.  Stop aspirin .  No signs of bleeding.  Did receive fluids during the hospital course.  Last hemoglobin 7.9.  Continue to watch hemoglobin as outpatient.  Refer for iron  infusions.

## 2024-04-11 NOTE — Assessment & Plan Note (Signed)
 MRI showing 2.6 cm complex subcapsular lesion of the right kidney considered indeterminate recommend continued follow-up with renal protocol CT in 6 months.  With chronic kidney disease may have to repeat with an MRI.

## 2024-04-11 NOTE — Progress Notes (Signed)
 Mobility Specialist - Progress Note   04/11/24 1420  Mobility  Activity Ambulated with assistance  Level of Assistance Contact guard assist, steadying assist  Assistive Device Front wheel walker  Distance Ambulated (ft) 20 ft  Activity Response Tolerated well  Mobility visit 1 Mobility  Mobility Specialist Start Time (ACUTE ONLY) 1403  Mobility Specialist Stop Time (ACUTE ONLY) 1418  Mobility Specialist Time Calculation (min) (ACUTE ONLY) 15 min   Pt supine upon entry, utilizing RA. Pt completed bed mob ModI, STS to RW and amb to/from the window within the room CGA--- VC's for proper body postioning within the RW. Pt returned to bed, left supine w/ NT present at bedside.  Darin Hopkins Mobility Specialist 04/11/24 2:33 PM

## 2024-04-12 ENCOUNTER — Telehealth: Payer: Self-pay | Admitting: Gastroenterology

## 2024-04-12 DIAGNOSIS — K4090 Unilateral inguinal hernia, without obstruction or gangrene, not specified as recurrent: Secondary | ICD-10-CM | POA: Diagnosis not present

## 2024-04-12 DIAGNOSIS — J441 Chronic obstructive pulmonary disease with (acute) exacerbation: Secondary | ICD-10-CM | POA: Diagnosis not present

## 2024-04-12 DIAGNOSIS — K8689 Other specified diseases of pancreas: Secondary | ICD-10-CM | POA: Diagnosis not present

## 2024-04-12 DIAGNOSIS — R710 Precipitous drop in hematocrit: Secondary | ICD-10-CM | POA: Diagnosis not present

## 2024-04-12 DIAGNOSIS — I5032 Chronic diastolic (congestive) heart failure: Secondary | ICD-10-CM

## 2024-04-12 LAB — BASIC METABOLIC PANEL WITH GFR
Anion gap: 2 — ABNORMAL LOW (ref 5–15)
BUN: 30 mg/dL — ABNORMAL HIGH (ref 8–23)
CO2: 34 mmol/L — ABNORMAL HIGH (ref 22–32)
Calcium: 8.3 mg/dL — ABNORMAL LOW (ref 8.9–10.3)
Chloride: 105 mmol/L (ref 98–111)
Creatinine, Ser: 1.73 mg/dL — ABNORMAL HIGH (ref 0.61–1.24)
GFR, Estimated: 44 mL/min — ABNORMAL LOW (ref >60)
Glucose, Bld: 82 mg/dL (ref 70–99)
Potassium: 4.9 mmol/L (ref 3.5–5.1)
Sodium: 142 mmol/L (ref 135–145)

## 2024-04-12 LAB — ABO/RH: ABO/RH(D): B POS

## 2024-04-12 LAB — CBC
HCT: 22.7 % — ABNORMAL LOW (ref 39.0–52.0)
Hemoglobin: 6.7 g/dL — ABNORMAL LOW (ref 13.0–17.0)
MCH: 27.8 pg (ref 26.0–34.0)
MCHC: 29.5 g/dL — ABNORMAL LOW (ref 30.0–36.0)
MCV: 94.2 fL (ref 80.0–100.0)
Platelets: 184 K/uL (ref 150–400)
RBC: 2.41 MIL/uL — ABNORMAL LOW (ref 4.22–5.81)
RDW: 13 % (ref 11.5–15.5)
WBC: 8.1 K/uL (ref 4.0–10.5)
nRBC: 0 % (ref 0.0–0.2)

## 2024-04-12 LAB — PREPARE RBC (CROSSMATCH)

## 2024-04-12 LAB — HEMOGLOBIN AND HEMATOCRIT, BLOOD
HCT: 31.3 % — ABNORMAL LOW (ref 39.0–52.0)
Hemoglobin: 9.4 g/dL — ABNORMAL LOW (ref 13.0–17.0)

## 2024-04-12 LAB — FERRITIN: Ferritin: 99 ng/mL (ref 24–336)

## 2024-04-12 MED ORDER — DAPAGLIFLOZIN PROPANEDIOL 10 MG PO TABS
10.0000 mg | ORAL_TABLET | Freq: Every day | ORAL | Status: DC
  Filled 2024-04-12: qty 1

## 2024-04-12 MED ORDER — SODIUM CHLORIDE 0.9% IV SOLUTION: Freq: Once | INTRAVENOUS | Status: AC

## 2024-04-12 MED ORDER — ACETAMINOPHEN 325 MG PO TABS
650.0000 mg | ORAL_TABLET | Freq: Once | ORAL | Status: AC
  Administered 2024-04-12: 650 mg via ORAL
  Filled 2024-04-12: qty 2

## 2024-04-12 MED ORDER — ACETAMINOPHEN 325 MG PO TABS: 650.0000 mg | ORAL_TABLET | Freq: Four times a day (QID) | ORAL | Status: AC | PRN

## 2024-04-12 NOTE — Assessment & Plan Note (Signed)
 No signs of congestive heart failure on this hospital course even with giving IV fluids and blood.  Can likely go back on Farxiga  as outpatient.  No ACE or ARB or spironolactone with acute kidney injury and hyperkalemia.  On low-dose bisoprolol .

## 2024-04-12 NOTE — Telephone Encounter (Signed)
 Good Afternoon Dr Wilhelmenia  We received a referral for patient for  Pancreatic mass ,Can you please review and advise on scheduling.   Thank you for all you do.

## 2024-04-12 NOTE — Plan of Care (Signed)

## 2024-04-12 NOTE — Plan of Care (Signed)
  Problem: Clinical Measurements: Goal: Respiratory complications will improve Outcome: Progressing Goal: Cardiovascular complication will be avoided Outcome: Progressing   Problem: Activity: Goal: Risk for activity intolerance will decrease Outcome: Not Progressing   

## 2024-04-12 NOTE — Discharge Summary (Signed)
 Physician Discharge Summary   Patient: Darin Hopkins MRN: [id number] DOB: 03-12-1961  Admit date:     04/03/2024  Discharge date: 04/12/24  Discharge Physician: Charlie Patterson   PCP: [name], NP   Recommendations at discharge:   Follow-up at team at rehab 1 day Recommend checking a CBC and BMP in 1 week Referral to Dr. Wilhelmenia gastroenterology for endoscopic ultrasound  Discharge Diagnoses: Principal Problem:   Drop in hemoglobin Active Problems:   COPD exacerbation (HCC)   Acute kidney injury superimposed on CKD   Hypernatremia   Hyperkalemia   Pancreatic mass   Kidney lesion, native, right   Left inguinal hernia   History of seizure   Chronic respiratory failure with hypoxia and hypercapnia (HCC)   Chronic diastolic CHF (congestive heart failure) (HCC)  Resolved Problems:   * No resolved hospital problems. *  Hospital Course: 63 y.o. male with a PMH significant for COPD, HTN, left inguinal hernia, GERD, CHF, history of seizure, HLD, alcohol dependence, protein calorie malnutrition, CKD 3.   They presented from home to the ED on 04/03/2024 with concerns for considerable weakness x 2 days.  Per EMS report, patient's O2 saturations on their initial evaluation were in the 50s which appears to be due to the fact that his oxygen tank was empty and chronically is supposed to be on 2 to 3 L.  They also reported significant bedbugs in his home.  He was put on home oxygen with significant improvement.   In the ED, it was found that they had stable vital signs on 2 L nasal cannula.  It appears that patient was put on BiPAP at some point earlier this morning but there is no documented desaturation or reason for this and when I evaluated patient he was stable and comfortable so transitioned back to his home nasal cannula oxygen.  Significant findings included: Essentially pan CT scanned in the ED with several findings:   -Possible left lower lobe pneumonia on chest CT.    -Large left groin hernia without obstruction or incarceration. -Abnormal findings in the pancreas on CT scan were concerning for mass but not fully evaluated so radiology recommended abdominal MRI to further evaluate. -Also has mass on lower pole of right kidney -Additionally, multiple nodules were seen in the right lower lobe of his lungs which is recommended to have noncontrast chest CT follow-up in 3 to 6 months.   They were initially treated with Mucinex , DuoNeb, Solu-Medrol , ceftriaxone , doxycycline .    Patient was admitted to medicine service for further workup and management of hypoxia as outlined in detail below  MRI of the abdomen showed motion degraded exam, moderate dilatation of main pancreatic duct with and subtle soft tissue prominence suspicious for small pancreatic mass recommending EUS for further consideration, 2.6 cm complex subcapsular lesion of the right kidney considered intermediate to Bosniak category 40F lesion recommend continue follow-up by renal protocol abdomen CT with and 6 months.  12/3.  Received insurance authorization for rehab.  I ordered labs for today since potassium was high yesterday.  Hemoglobin drifted down to 7.3, potassium 5.  Creatinine 1.72.  Will check hemoglobin again tomorrow with type and cross.  IV fluids stopped. 12/4.  Hemoglobin drifted down to 6.7.  1 unit of packed red blood cells given and hemoglobin up to 9.4.  Patient feels well after transfusion.  No shortness of breath.  Assessment and Plan: * Drop in hemoglobin Hemoglobin 6.7 today.  Discontinue IV fluids yesterday afternoon.  Most  likely dilutional from IV fluid hydration.  Patient given 1 unit of packed red blood cells today and hemoglobin up to 9.4.  COPD exacerbation (HCC) Treated during hospital course  Acute kidney injury superimposed on CKD Acute kidney injury on CKD stage IIIb.  Today's creatinine 1.73 with a GFR 44.  Creatinine peaked at 2.77.  Hypernatremia Sodium  normalized with IV fluids.   Hyperkalemia Patient received Lokelma  during the hospital course.  Last potassium 4.9.  Can check potassium again in 1 week.  Can give Lokelma  if high.  Pancreatic mass CA 19-9 only a few points higher than the normal range.  Recommend outpatient follow-up with Dr. Wilhelmenia for endoscopic ultrasound.  Kidney lesion, native, right MRI showing 2.6 cm complex subcapsular lesion of the right kidney considered indeterminate recommend continued follow-up with renal protocol CT in 6 months.  Left inguinal hernia Seen by general surgery and not a surgical candidate currently.  Chronic diastolic CHF (congestive heart failure) (HCC) No signs of congestive heart failure on this hospital course even with giving IV fluids and blood.  Can go back on Farxiga  as outpatient.  No ACE or ARB or spironolactone with acute kidney injury and hyperkalemia.  Holding on beta-blocker secondary to relative bradycardia.  Chronic respiratory failure with hypoxia and hypercapnia (HCC) On 3 L of oxygen  History of seizure On Keppra          Consultants: Physical therapy and Occupational Therapy, general surgery and palliative care Procedures performed: None Disposition: Rehabilitation facility Diet recommendation:  Cardiac diet DISCHARGE MEDICATION: Allergies as of 04/12/2024   No Known Allergies      Medication List     STOP taking these medications    amLODipine  10 MG tablet Commonly known as: NORVASC    aspirin  EC 81 MG tablet   bisoprolol  5 MG tablet Commonly known as: ZEBETA    furosemide  20 MG tablet Commonly known as: LASIX    hydrALAZINE  50 MG tablet Commonly known as: APRESOLINE    potassium chloride  10 MEQ tablet Commonly known as: KLOR-CON  M   thiamine  100 MG tablet       TAKE these medications    acetaminophen  325 MG tablet Commonly known as: TYLENOL  Take 2 tablets (650 mg total) by mouth every 6 (six) hours as needed for mild pain (pain  score 1-3) or fever (or Fever >/= 101).   atorvastatin  20 MG tablet Commonly known as: LIPITOR Take 20 mg by mouth every evening.   cholecalciferol 25 MCG (1000 UNIT) tablet Commonly known as: VITAMIN D3 Take 1,000 Units by mouth daily.   Farxiga  10 MG Tabs tablet Generic drug: dapagliflozin propanediol  Take 1 tablet (10 mg total) by mouth daily before breakfast.   FeroSul 325 (65 Fe) MG tablet Generic drug: ferrous sulfate Take 325 mg by mouth daily with breakfast.   folic acid  1 MG tablet Commonly known as: FOLVITE  Take 1 mg by mouth daily.   levETIRAcetam  750 MG tablet Commonly known as: KEPPRA  Take 1 tablet (750 mg total) by mouth 2 (two) times daily.   Multivitamin Adult Tabs Take 2 tablets by mouth daily.   omeprazole 20 MG capsule Commonly known as: PRILOSEC Take 20 mg by mouth daily before breakfast.   ProAir  HFA 108 (90 Base) MCG/ACT inhaler Generic drug: albuterol  Inhale 2 puffs into the lungs every 6 (six) hours as needed for shortness of breath or wheezing.   Stiolto Respimat  2.5-2.5 MCG/ACT Aers Generic drug: Tiotropium Bromide -Olodaterol Inhale 2 puffs into the lungs daily. Pt needs to make  an appointment to continue to receive refills please.   thiamine  100 MG tablet Commonly known as: VITAMIN B1 Take 100 mg by mouth daily.   traZODone  50 MG tablet Commonly known as: DESYREL  Take 1 tablet (50 mg total) by mouth at bedtime.        Contact information for follow-up providers     Mansouraty, Aloha Raddle., MD Follow up in 3 week(s).   Specialties: Gastroenterology, Internal Medicine Why: will need endoscopic ultrasound of pancreaus Contact information: [address] [place] [address] [phone]              Contact information for after-discharge care     Destination     [facility] .   Service: Skilled Nursing Contact information: [address] [place]  [address] [phone]                     Discharge Exam: Fredricka Weights   04/03/24 0341 04/03/24 2236  Weight: 51.7 kg 53.4 kg   Physical Exam HENT:     Head: Normocephalic.  Eyes:     General: Lids are normal.  Cardiovascular:     Rate and Rhythm: Normal rate and regular rhythm.     Heart sounds: Normal heart sounds, S1 normal and S2 normal.  Pulmonary:     Breath sounds: Examination of the right-lower field reveals decreased breath sounds. Examination of the left-lower field reveals decreased breath sounds. Decreased breath sounds present. No wheezing, rhonchi or rales.  Abdominal:     Palpations: Abdomen is soft.     Tenderness: There is no abdominal tenderness.  Musculoskeletal:     Right lower leg: No swelling.     Left lower leg: No swelling.  Skin:    General: Skin is warm.     Findings: No rash.  Neurological:     Mental Status: He is alert.      Condition at discharge: stable  The results of significant diagnostics from this hospitalization (including imaging, microbiology, ancillary and laboratory) are listed below for reference.   Imaging Studies: MR ABDOMEN W WO CONTRAST Result Date: 04/04/2024 CLINICAL DATA:  Pancreatic ductal dilatation and possible pancreatic mass. Indeterminate right renal lesion. EXAM: MRI ABDOMEN WITHOUT AND WITH CONTRAST TECHNIQUE: Multiplanar multisequence MR imaging of the abdomen was performed both before and after the administration of intravenous contrast. CONTRAST:  5mL GADAVIST  GADOBUTROL  1 MMOL/ML IV SOLN COMPARISON:  CT on 04/03/2024 FINDINGS: Lower chest: Left lower lobe airspace disease, suspicious for pneumonia. Hepatobiliary: Image degradation by motion artifact noted. No hepatic masses identified. Gallbladder is unremarkable. No evidence of biliary ductal dilatation. Pancreas: Image degradation by motion artifact noted. Moderate dilatation of the main pancreatic duct is seen in the body and tail. Stricture of the pancreatic duct is seen within the body, with  subtle soft tissue prominence measuring 1.9 x 1.1 cm, best visualized on image 43/19. This is suspicious for a small pancreatic mass. Spleen:  Within normal limits in size and appearance. Adrenals/Urinary Tract: Image degradation by motion artifact noted, particularly the dynamic postcontrast sequences. Tiny benign-appearing Bosniak category 1 and 2 renal cysts are seen bilaterally. A complex subcapsular lesion is seen in the lateral lower pole of the right kidney which shows heterogeneous T1 hyperintensity and T2 hypointensity, and measures 2.6 x 2.5 cm. Although limited, no definite contrast enhancement of this lesion is seen on subtraction imaging. This is likely a complex hemorrhagic cystic lesion, and considered a Bosniak category 2  F lesion. No evidence of hydronephrosis. Stomach/Bowel: Unremarkable. Vascular/Lymphatic: No pathologically enlarged lymph nodes identified. No acute vascular findings. Other:  None. Musculoskeletal:  No suspicious bone lesions identified. IMPRESSION: Motion degraded exam. Moderate dilatation of the main pancreatic duct, with stricture in the pancreatic body and subtle soft tissue prominence suspicious for a small pancreatic mass. Recommend GI consultation for consideration of EUS/FNA. 2.6 cm complex subcapsular lesion in the right kidney is considered an indeterminate Bosniak category 2 F lesion. Recommend continued follow-up by renal protocol abdomen CT without and with contrast in 6 months. Left lower lobe airspace disease, suspicious for pneumonia. Electronically Signed   By: Norleen DELENA Kil M.D.   On: 04/04/2024 08:46   MR 3D Recon At Scanner Result Date: 04/04/2024 CLINICAL DATA:  Pancreatic ductal dilatation and possible pancreatic mass. Indeterminate right renal lesion. EXAM: MRI ABDOMEN WITHOUT AND WITH CONTRAST TECHNIQUE: Multiplanar multisequence MR imaging of the abdomen was performed both before and after the administration of intravenous contrast. CONTRAST:  5mL  GADAVIST  GADOBUTROL  1 MMOL/ML IV SOLN COMPARISON:  CT on 04/03/2024 FINDINGS: Lower chest: Left lower lobe airspace disease, suspicious for pneumonia. Hepatobiliary: Image degradation by motion artifact noted. No hepatic masses identified. Gallbladder is unremarkable. No evidence of biliary ductal dilatation. Pancreas: Image degradation by motion artifact noted. Moderate dilatation of the main pancreatic duct is seen in the body and tail. Stricture of the pancreatic duct is seen within the body, with subtle soft tissue prominence measuring 1.9 x 1.1 cm, best visualized on image 43/19. This is suspicious for a small pancreatic mass. Spleen:  Within normal limits in size and appearance. Adrenals/Urinary Tract: Image degradation by motion artifact noted, particularly the dynamic postcontrast sequences. Tiny benign-appearing Bosniak category 1 and 2 renal cysts are seen bilaterally. A complex subcapsular lesion is seen in the lateral lower pole of the right kidney which shows heterogeneous T1 hyperintensity and T2 hypointensity, and measures 2.6 x 2.5 cm. Although limited, no definite contrast enhancement of this lesion is seen on subtraction imaging. This is likely a complex hemorrhagic cystic lesion, and considered a Bosniak category 2 F lesion. No evidence of hydronephrosis. Stomach/Bowel: Unremarkable. Vascular/Lymphatic: No pathologically enlarged lymph nodes identified. No acute vascular findings. Other:  None. Musculoskeletal:  No suspicious bone lesions identified. IMPRESSION: Motion degraded exam. Moderate dilatation of the main pancreatic duct, with stricture in the pancreatic body and subtle soft tissue prominence suspicious for a small pancreatic mass. Recommend GI consultation for consideration of EUS/FNA. 2.6 cm complex subcapsular lesion in the right kidney is considered an indeterminate Bosniak category 2 F lesion. Recommend continued follow-up by renal protocol abdomen CT without and with contrast in 6  months. Left lower lobe airspace disease, suspicious for pneumonia. Electronically Signed   By: Norleen DELENA Kil M.D.   On: 04/04/2024 08:46   CT HEAD WO CONTRAST ( ) Result Date: 04/03/2024 EXAM: CT HEAD AND CERVICAL SPINE 04/03/2024 04:32:45 AM TECHNIQUE: CT of the head and cervical spine was performed without the administration of intravenous contrast. Multiplanar reformatted images are provided for review. Automated exposure control, iterative reconstruction, and/or weight based adjustment of the mA/kV was utilized to reduce the radiation dose to as low as reasonably achievable. COMPARISON: Head CT 09/09/2020. No prior cervical spine imaging. CLINICAL HISTORY: Head trauma, abnormal mental status (Age 71-64y) FINDINGS: CT HEAD BRAIN AND VENTRICLES: No acute intracranial hemorrhage. No mass effect or midline shift. No abnormal extra-axial fluid collection. There is encephalomalacia again noted due to a chronic right MCA infarct, centered in  the right gangliocapsular area with extension into the posterior right frontal and anterior right parietal lobes and insula. Ex vacuo prominence of the right lateral ventricle is again noted as well. There is atrophy with atrophic ventriculomegaly and moderate to severe small vessel disease, which is advanced for age. There is a chronic lacunar infarct in the left basal ganglia. Asymmetric atrophy in the left cerebellar hemisphere. No cortical-based acute infarct. No hydrocephalus. There are patchy calcifications in both siphons. There are no hyperdense vessels. ORBITS: No acute abnormality. SINUSES AND MASTOIDS: There is chronic fluid in the right mastoid tip. There is increased left maxillary sinus membrane thickening with trace intracavitary fluid. The other paranasal sinuses and left mastoid air cells are clear. SOFT TISSUES AND SKULL: No acute skull fracture. No acute soft tissue abnormality. CT CERVICAL SPINE LIMITATIONS: Imaging of the cervical spine is limited due to  patient motion. BONES AND ALIGNMENT: There is a mild cervical kypholevoscoliosis, centered at C3-4. There is a 2 to 3 mm grade 1 retrolisthesis at C3-C4 and C5-C6, both believed to be related to degenerative disc disease. No traumatic malalignment is seen. No widening of the anterior atlantoaxial joint. No acute cervical fracture is seen through the motion artifacts. DEGENERATIVE CHANGES: There is disc collapse with a chronic appearance and mild endplate irregularities at C3-4 and C5-6, mild disc space loss at C4-C5 and moderate disc space loss at C6-C7. The disc spaces at C2-3, C7-T1 are normal in height. There is mild facet joint hypertrophy at the upper cervical levels, with mild left foraminal stenosis at C3-4 and C4-5. All other foramina are clear. There are anterior and posterior osteophytes from C3-4 through C6-7. There are posterior osteophytes encroaching on the ventral cord surface at C3-4, C4-5 and C5-6, with mild spinal stenosis at these levels. Other levels do not show significant encroachment on the thecal sac allowing for motion artifact. VASCULATURE: Both Proximal cervical ICAs are heavily calcified. SOFT TISSUES: There are stones in both palatine tonsils. No laryngeal mass is seen. No prevertebral soft tissue swelling. IMPRESSION: 1. No acute intracranial CT findings or depressed skull fractures. 2. Encephalomalacia due to a chronic right MCA infarct with ex vacuo prominence of the right lateral ventricle. 3. Atrophy with atrophic ventriculomegaly and moderate to severe small vessel disease, advanced for age. 4. Chronic lacunar infarct in the left basal ganglia. 5. Asymmetric atrophy in the left cerebellar hemisphere. 6. Mild cervical kypholevoscoliosis centered at C3-4. 7. 2 to 3 mm grade 1 retrolisthesis at C3-C4 and C5-C6, likely related to degenerative disc disease. 8. Disc collapse with a chronic appearance and mild endplate irregularities at C3-4 and C5-6, mild disc space loss at C4-C5, and  moderate disc space loss at C6-C7. Spondylosis. 9. Mild left foraminal stenosis at C3-4 and C4-5. All other foramina are clear. 10. Posterior osteophytes encroaching on the ventral cord surface at C3-4, C4-5, and C5-6, with mild spinal stenosis at these levels. Other levels do not show significant encroachment on the thecal sac, allowing for motion artifact. Electronically signed by: Francis Quam MD 04/03/2024 05:09 AM EST RP Workstation: [id number]   CT Cervical Spine Wo Contrast Result Date: 04/03/2024 EXAM: CT HEAD AND CERVICAL SPINE 04/03/2024 04:32:45 AM TECHNIQUE: CT of the head and cervical spine was performed without the administration of intravenous contrast. Multiplanar reformatted images are provided for review. Automated exposure control, iterative reconstruction, and/or weight based adjustment of the mA/kV was utilized to reduce the radiation dose to as low as reasonably achievable. COMPARISON: Head CT 09/09/2020.  No prior cervical spine imaging. CLINICAL HISTORY: Head trauma, abnormal mental status (Age 76-64y) FINDINGS: CT HEAD BRAIN AND VENTRICLES: No acute intracranial hemorrhage. No mass effect or midline shift. No abnormal extra-axial fluid collection. There is encephalomalacia again noted due to a chronic right MCA infarct, centered in the right gangliocapsular area with extension into the posterior right frontal and anterior right parietal lobes and insula. Ex vacuo prominence of the right lateral ventricle is again noted as well. There is atrophy with atrophic ventriculomegaly and moderate to severe small vessel disease, which is advanced for age. There is a chronic lacunar infarct in the left basal ganglia. Asymmetric atrophy in the left cerebellar hemisphere. No cortical-based acute infarct. No hydrocephalus. There are patchy calcifications in both siphons. There are no hyperdense vessels. ORBITS: No acute abnormality. SINUSES AND MASTOIDS: There is chronic fluid in the right mastoid  tip. There is increased left maxillary sinus membrane thickening with trace intracavitary fluid. The other paranasal sinuses and left mastoid air cells are clear. SOFT TISSUES AND SKULL: No acute skull fracture. No acute soft tissue abnormality. CT CERVICAL SPINE LIMITATIONS: Imaging of the cervical spine is limited due to patient motion. BONES AND ALIGNMENT: There is a mild cervical kypholevoscoliosis, centered at C3-4. There is a 2 to 3 mm grade 1 retrolisthesis at C3-C4 and C5-C6, both believed to be related to degenerative disc disease. No traumatic malalignment is seen. No widening of the anterior atlantoaxial joint. No acute cervical fracture is seen through the motion artifacts. DEGENERATIVE CHANGES: There is disc collapse with a chronic appearance and mild endplate irregularities at C3-4 and C5-6, mild disc space loss at C4-C5 and moderate disc space loss at C6-C7. The disc spaces at C2-3, C7-T1 are normal in height. There is mild facet joint hypertrophy at the upper cervical levels, with mild left foraminal stenosis at C3-4 and C4-5. All other foramina are clear. There are anterior and posterior osteophytes from C3-4 through C6-7. There are posterior osteophytes encroaching on the ventral cord surface at C3-4, C4-5 and C5-6, with mild spinal stenosis at these levels. Other levels do not show significant encroachment on the thecal sac allowing for motion artifact. VASCULATURE: Both Proximal cervical ICAs are heavily calcified. SOFT TISSUES: There are stones in both palatine tonsils. No laryngeal mass is seen. No prevertebral soft tissue swelling. IMPRESSION: 1. No acute intracranial CT findings or depressed skull fractures. 2. Encephalomalacia due to a chronic right MCA infarct with ex vacuo prominence of the right lateral ventricle. 3. Atrophy with atrophic ventriculomegaly and moderate to severe small vessel disease, advanced for age. 4. Chronic lacunar infarct in the left basal ganglia. 5. Asymmetric  atrophy in the left cerebellar hemisphere. 6. Mild cervical kypholevoscoliosis centered at C3-4. 7. 2 to 3 mm grade 1 retrolisthesis at C3-C4 and C5-C6, likely related to degenerative disc disease. 8. Disc collapse with a chronic appearance and mild endplate irregularities at C3-4 and C5-6, mild disc space loss at C4-C5, and moderate disc space loss at C6-C7. Spondylosis. 9. Mild left foraminal stenosis at C3-4 and C4-5. All other foramina are clear. 10. Posterior osteophytes encroaching on the ventral cord surface at C3-4, C4-5, and C5-6, with mild spinal stenosis at these levels. Other levels do not show significant encroachment on the thecal sac, allowing for motion artifact. Electronically signed by: Francis Quam MD 04/03/2024 05:09 AM EST RP Workstation: [id number]   CT CHEST ABDOMEN PELVIS WO CONTRAST Result Date: 04/03/2024 CLINICAL DATA:  Increasing weakness. EXAM: CT CHEST, ABDOMEN AND PELVIS  WITHOUT CONTRAST TECHNIQUE: Multidetector CT imaging of the chest, abdomen and pelvis was performed following the standard protocol without IV contrast. RADIATION DOSE REDUCTION: This exam was performed according to the departmental dose-optimization program which includes automated exposure control, adjustment of the mA and/or kV according to patient size and/or use of iterative reconstruction technique. COMPARISON:  Chest CT 08/20/2020 FINDINGS: CT CHEST FINDINGS Cardiovascular: Heart size upper normal. No substantial pericardial effusion. Coronary artery calcification is evident. Mild atherosclerotic calcification is noted in the wall of the thoracic aorta. Ascending thoracic aorta measures up to 4.2 cm diameter. Mediastinum/Nodes: No mediastinal lymphadenopathy. No evidence for gross hilar lymphadenopathy although assessment is limited by the lack of intravenous contrast on the current study. The esophagus has normal imaging features. Axillary lymphadenopathy noted bilaterally. Left axillary index lymph node  measures 17 mm short axis on 20/2. Lungs/Pleura: Centrilobular and paraseptal emphysema evident. 8 mm right middle lobe nodule on 134/4. 6 mm right lower lobe nodule on 132/4. Consolidative airspace disease in the left lower lobe. No substantial pleural effusion. Musculoskeletal: No worrisome lytic or sclerotic osseous abnormality. CT ABDOMEN PELVIS FINDINGS Hepatobiliary: No suspicious focal abnormality in the liver on this study without intravenous contrast. Gallbladder not well seen likely due to nondistention. No intra or extrahepatic biliary duct dilatation. Pancreas: There is marked diffuse dilatation of the main pancreatic duct measuring up to 7 mm diameter in the pancreatic tail region. Pancreatic duct becomes obscured near the junction of the body and tail (image 81/2) and again appears distended through the pancreatic head region. Imaging of the pancreas is limited by lack of intravenous contrast material. Spleen: No splenomegaly. No suspicious focal mass lesion. Adrenals/Urinary Tract: No adrenal nodule or mass. 2.5 x 2.6 cm heterogeneous exophytic lesion identified lower pole right kidney, potentially with some mineralization. Left kidney unremarkable. No hydroureter. The urinary bladder appears normal for the degree of distention. Stomach/Bowel: Diffuse gastric wall thickening evident, likely accentuated by underdistention. Duodenum is normally positioned as is the ligament of Treitz. No small bowel wall thickening. No small bowel dilatation. No gross colonic mass. No colonic wall thickening. Moderate stool volume evident. Vascular/Lymphatic: There is advanced atherosclerotic calcification of the abdominal aorta without aneurysm. There is no gastrohepatic or hepatoduodenal ligament lymphadenopathy. 12 mm short axis left para-aortic lymph node evident. Other mildly enlarged retroperitoneal lymph nodes are identified. 13 mm short axis right common iliac node on 106/2. Mild lymphadenopathy is identified in  the groin regions. Reproductive: Prostate gland is enlarged. Other: No substantial intraperitoneal free fluid. Musculoskeletal: Extremely large left groin hernia contains small bowel and colon. There is some congestion in the small bowel mesentery of the herniated segments with probable small volume fluid in the hernia sac. No features to suggest overt bowel obstruction due to the herniated bowel segments. No worrisome lytic or sclerotic osseous abnormality. IMPRESSION: 1. Consolidative airspace disease in the left lower lobe compatible pneumonia. Imaging follow-up recommended to ensure complete resolution. 2. Extremely large left groin hernia contains small bowel and colon. There is some congestion in the small bowel mesentery of the herniated segments with probable small volume fluid in the hernia sac. No features to suggest overt bowel obstruction due to the herniated bowel segments. Correlation for signs/symptoms of incarceration recommended. 3. Marked diffuse dilatation of the main pancreatic duct measuring up to 7 mm diameter in the pancreatic tail region. Pancreatic duct becomes obscured near the junction of the body and tail without a discernible mass lesion on noncontrast imaging. Main pancreatic duct is  also distended through the pancreatic head region. Imaging of the pancreas is limited by lack of intravenous contrast material. MRI of the abdomen with and without contrast recommended to further evaluate. 4. 2.5 x 2.6 cm heterogeneous exophytic lesion lower pole right kidney, potentially with some mineralization. Findings raise concern for renal cell carcinoma. This could also be further assessed at the time of MRI. 5. Axillary, retroperitoneal, and groin lymphadenopathy. Lymphoproliferative disorder not excluded. 6. 8 mm right middle lobe and 6 mm right lower lobe pulmonary nodules. Non-contrast chest CT at 3-6 months is recommended. If the nodules are stable at time of repeat CT, then future CT at 18-24  months (from today's scan) is considered optional for low-risk patients, but is recommended for high-risk patients. This recommendation follows the consensus statement: Guidelines for Management of Incidental Pulmonary Nodules Detected on CT Images: From the Fleischner Society 2017; Radiology 2017; 284:228-243. 7. 4.2 cm ascending thoracic aortic aneurysm. Recommend annual imaging followup by CTA or MRA. This recommendation follows 2010 ACCF/AHA/AATS/ACR/ASA/SCA/SCAI/SIR/STS/SVM Guidelines for the Diagnosis and Management of Patients with Thoracic Aortic Disease. Circulation. 2010; 121: Z733-z630. Aortic aneurysm NOS (ICD10-I71.9) 8. Aortic Atherosclerosis (ICD10-I70.0) and Emphysema (ICD10-J43.9). Electronically Signed   By: Camellia Candle M.D.   On: 04/03/2024 05:02    Microbiology: Results for orders placed or performed during the hospital encounter of 04/03/24  Resp panel by RT-PCR (RSV, Flu A&B, Covid) Anterior Nasal Swab     Status: None   Collection Time: 04/03/24  3:59 AM   Specimen: Anterior Nasal Swab  Result Value Ref Range Status   SARS Coronavirus 2 by RT PCR NEGATIVE NEGATIVE Final    Comment: (NOTE) SARS-CoV-2 target nucleic acids are NOT DETECTED.  The SARS-CoV-2 RNA is generally detectable in upper respiratory specimens during the acute phase of infection. The lowest concentration of SARS-CoV-2 viral copies this assay can detect is 138 copies/mL. A negative result does not preclude SARS-Cov-2 infection and should not be used as the sole basis for treatment or other patient management decisions. A negative result may occur with  improper specimen collection/handling, submission of specimen other than nasopharyngeal swab, presence of viral mutation(s) within the areas targeted by this assay, and inadequate number of viral copies(<138 copies/mL). A negative result must be combined with clinical observations, patient history, and epidemiological information. The expected result is  Negative.  Fact Sheet for Patients:  bloggercourse.com  Fact Sheet for Healthcare Providers:  seriousbroker.it  This test is no t yet approved or cleared by the [place]  FDA and  has been authorized for detection and/or diagnosis of SARS-CoV-2 by FDA under an Emergency Use Authorization (EUA). This EUA will remain  in effect (meaning this test can be used) for the duration of the COVID-19 declaration under Section 564(b)(1) of the Act, 21 U.S.C.section 360bbb-3(b)(1), unless the authorization is terminated  or revoked sooner.       Influenza A by PCR NEGATIVE NEGATIVE Final   Influenza B by PCR NEGATIVE NEGATIVE Final    Comment: (NOTE) The Xpert Xpress SARS-CoV-2/FLU/RSV plus assay is intended as an aid in the diagnosis of influenza from Nasopharyngeal swab specimens and should not be used as a sole basis for treatment. Nasal washings and aspirates are unacceptable for Xpert Xpress SARS-CoV-2/FLU/RSV testing.  Fact Sheet for Patients: bloggercourse.com  Fact Sheet for Healthcare Providers: seriousbroker.it  This test is not yet approved or cleared by the [place]  FDA and has been authorized for detection and/or diagnosis of SARS-CoV-2 by FDA under an Emergency  Use Authorization (EUA). This EUA will remain in effect (meaning this test can be used) for the duration of the COVID-19 declaration under Section 564(b)(1) of the Act, 21 U.S.C. section 360bbb-3(b)(1), unless the authorization is terminated or revoked.     Resp Syncytial Virus by PCR NEGATIVE NEGATIVE Final    Comment: (NOTE) Fact Sheet for Patients: bloggercourse.com  Fact Sheet for Healthcare Providers: seriousbroker.it  This test is not yet approved or cleared by the [place]  FDA and has been authorized for detection and/or diagnosis of  SARS-CoV-2 by FDA under an Emergency Use Authorization (EUA). This EUA will remain in effect (meaning this test can be used) for the duration of the COVID-19 declaration under Section 564(b)(1) of the Act, 21 U.S.C. section 360bbb-3(b)(1), unless the authorization is terminated or revoked.  Performed at [facility], [address] [place], [place] [address]     Labs: CBC: Recent Labs  Lab 04/07/24 1130 04/09/24 0927 04/10/24 0934 04/11/24 1328 04/12/24 0522 04/12/24 1328  WBC 11.7* 12.6* 10.9*  --  8.1  --   NEUTROABS 4.8 3.8 3.7  --   --   --   HGB 7.6* 7.5* 8.0* 7.3* 6.7* 9.4*  HCT 25.8* 25.8* 27.4*  --  22.7* 31.3*  MCV 93.5 94.2 93.8  --  94.2  --   PLT 231 236 228  --  184  --    Basic Metabolic Panel: Recent Labs  Lab 04/07/24 1130 04/09/24 0927 04/10/24 0425 04/10/24 0934 04/11/24 1328 04/12/24 0522  NA 148* 150*  --  147* 141 142  K 4.7 4.9  --  5.3* 5.0 4.9  CL 111 112*  --  108 105 105  CO2 33* 33*  --  33* 33* 34*  GLUCOSE 178* 84  --  193* 103* 82  BUN 35* 34*  --  35* 30* 30*  CREATININE 1.78* 1.89* 1.92* 1.95* 1.72* 1.73*  CALCIUM  9.1 9.0  --  9.2 8.5* 8.3*   Liver Function Tests: No results for input(s): AST, ALT, ALKPHOS, BILITOT, PROT, ALBUMIN in the last 168 hours. CBG: No results for input(s): GLUCAP in the last 168 hours.  Discharge time spent: greater than 30 minutes.  Signed: Charlie Patterson, MD [facility] 04/12/2024

## 2024-04-12 NOTE — TOC CM/SW Note (Cosign Needed Addendum)
 Transition of Care [facility]) CM/SW Note   Occupational Therapy * Physical Therapy * Speech Therapy  DATE 04/18/2024 PATIENT NAME Darin Hopkins  PATIENT MRN: [id number]   DIAGNOSIS/DIAGNOSIS CODE. R71.0  DATE OF DISCHARGE 04/13/2024  PRIMARY CARE PHYSICIAN Nat Mirza   PCP PHONE/FAX: [phone]   Dear Provider    I certify that I have examined this patient and that occupational/physical/speech therapy is necessary on an outpatient basis.    The patient has expressed interest in completing their recommended course of therapy at your location.  Once a formal order from the patient's primary care physician has been obtained, please contact him/her to schedule an appointment for evaluation at your earliest convenience.  [ x ]  Physical Therapy Evaluate and Treat  [ x ]  Occupational Therapy Evaluate and Treat  [  ]  Speech Therapy Evaluate and Treat  The patient's primary care physician (listed above) must furnish and be responsible for a formal order such that the recommended services may be furnished while under the primary physician's care, and that the plan of care will be established and reviewed every 30 days (or more often if condition necessitates).

## 2024-04-12 NOTE — TOC Progression Note (Signed)
 Transition of Care [facility]) - Progression Note    Patient Details  Name: Darin Hopkins MRN: [id number] Date of Birth: 08-11-1960  Transition of Care [facility]) CM/SW Contact  Eland Lamantia  [place], [place] Phone Number: 04/12/2024, 4:46 PM  Clinical Narrative:   LCSWA met patients sister at the bedside and she indicated that she does not want the patient to go to SNF in [facility] and would prefer that the patient goes home. LCSWA Completed and faxed out OP Rehab referral for patient to start once he discharges.   Sister also requested DME Hospital bed for when patients patient discharges  TOC to follow for discharge.    Expected Discharge Plan: Home/Self Care Barriers to Discharge: No Barriers Identified               Expected Discharge Plan and Services         Expected Discharge Date: 04/12/24                                     Social Drivers of Health (SDOH) Interventions SDOH Screenings   Food Insecurity: No Food Insecurity (04/03/2024)  Housing: Low Risk  (04/03/2024)  Transportation Needs: No Transportation Needs (04/03/2024)  Utilities: Not At Risk (04/03/2024)  Depression (PHQ2-9): Low Risk  (02/11/2022)  Tobacco Use: Medium Risk (04/03/2024)    Readmission Risk Interventions     No data to display

## 2024-04-12 NOTE — TOC Transition Note (Signed)
 Transition of Care [facility]) - Discharge Note   Patient Details  Name: Darin Hopkins MRN: [id number] Date of Birth: 04/10/1961  Transition of Care [facility]) CM/SW Contact:  Alvaro Louder, LCSW Phone Number: 04/12/2024, 2:56 PM   Clinical Narrative:   LCSWA received insurance approval for patient to admit to SNF [facility] LCSWA confirmed with MD that patient is stable for discharge. LCSWA notified the patient and they are in agreement with discharge. LCSWA confirmed bed is available at SNF. Transport arranged with [facility] for next available.  Number for report is [phone], RM: 114   TOC signing off  Final next level of care: Skilled Nursing Facility Barriers to Discharge: No Barriers Identified   Patient Goals and CMS Choice Patient states their goals for this hospitalization and ongoing recovery are:: Just go back home          Discharge Placement              Patient chooses bed at:  [facility] [place]) Patient to be transferred to facility by: [facility] Name of family member notified: [name] Patient and family notified of of transfer: 04/12/24  Discharge Plan and Services Additional resources added to the After Visit Summary for                                       Social Drivers of Health (SDOH) Interventions SDOH Screenings   Food Insecurity: No Food Insecurity (04/03/2024)  Housing: Low Risk  (04/03/2024)  Transportation Needs: No Transportation Needs (04/03/2024)  Utilities: Not At Risk (04/03/2024)  Depression (PHQ2-9): Low Risk  (02/11/2022)  Tobacco Use: Medium Risk (04/03/2024)     Readmission Risk Interventions     No data to display

## 2024-04-12 NOTE — Progress Notes (Signed)
 Notified that family does not want to go out to rehab in [place] anymore.  I canceled discharge.  TOC to set up outpatient rehab. Family also notified us  that he came in with clothes and a wallet and they are missing.  Notified appropriate staff in order to look into this.  Dr. Charlie Patterson

## 2024-04-13 DIAGNOSIS — Z87898 Personal history of other specified conditions: Secondary | ICD-10-CM

## 2024-04-13 DIAGNOSIS — N179 Acute kidney failure, unspecified: Secondary | ICD-10-CM | POA: Diagnosis not present

## 2024-04-13 DIAGNOSIS — R710 Precipitous drop in hematocrit: Secondary | ICD-10-CM | POA: Diagnosis not present

## 2024-04-13 DIAGNOSIS — E875 Hyperkalemia: Secondary | ICD-10-CM | POA: Diagnosis not present

## 2024-04-13 DIAGNOSIS — J441 Chronic obstructive pulmonary disease with (acute) exacerbation: Secondary | ICD-10-CM | POA: Diagnosis not present

## 2024-04-13 LAB — BASIC METABOLIC PANEL WITH GFR
Anion gap: 4 — ABNORMAL LOW (ref 5–15)
BUN: 30 mg/dL — ABNORMAL HIGH (ref 8–23)
CO2: 34 mmol/L — ABNORMAL HIGH (ref 22–32)
Calcium: 8.4 mg/dL — ABNORMAL LOW (ref 8.9–10.3)
Chloride: 104 mmol/L (ref 98–111)
Creatinine, Ser: 1.78 mg/dL — ABNORMAL HIGH (ref 0.61–1.24)
GFR, Estimated: 42 mL/min — ABNORMAL LOW (ref >60)
Glucose, Bld: 69 mg/dL — ABNORMAL LOW (ref 70–99)
Potassium: 5.2 mmol/L — ABNORMAL HIGH (ref 3.5–5.1)
Sodium: 141 mmol/L (ref 135–145)

## 2024-04-13 LAB — TYPE AND SCREEN
ABO/RH(D): B POS
Antibody Screen: NEGATIVE
Unit division: 0

## 2024-04-13 LAB — HEMOGLOBIN: Hemoglobin: 8.5 g/dL — ABNORMAL LOW (ref 13.0–17.0)

## 2024-04-13 LAB — BPAM RBC
Blood Product Expiration Date: 202512252359
ISSUE DATE / TIME: 202512040958
Unit Type and Rh: 7300

## 2024-04-13 MED ORDER — SODIUM ZIRCONIUM CYCLOSILICATE 10 G PO PACK
10.0000 g | PACK | Freq: Once | ORAL | Status: AC
  Administered 2024-04-13: 10 g via ORAL
  Filled 2024-04-13: qty 1

## 2024-04-13 MED ORDER — BISOPROLOL FUMARATE 5 MG PO TABS
2.5000 mg | ORAL_TABLET | Freq: Every day | ORAL | Status: DC
  Administered 2024-04-13 – 2024-04-20 (×8): 2.5 mg via ORAL
  Filled 2024-04-13 (×9): qty 0.5

## 2024-04-13 MED ORDER — BISOPROLOL FUMARATE 5 MG PO TABS: 5.0000 mg | ORAL_TABLET | Freq: Every day | ORAL | Status: DC

## 2024-04-13 NOTE — Plan of Care (Signed)
  Problem: Health Behavior/Discharge Planning: Goal: Ability to manage health-related needs will improve Outcome: Progressing   Problem: Clinical Measurements: Goal: Ability to maintain clinical measurements within normal limits will improve Outcome: Progressing Goal: Cardiovascular complication will be avoided Outcome: Progressing   Problem: Activity: Goal: Risk for activity intolerance will decrease Outcome: Progressing   

## 2024-04-13 NOTE — Progress Notes (Signed)
 Occupational Therapy Treatment Patient Details Name: Darin Hopkins MRN: [id number] DOB: [birth year] Today's Date: 04/13/2024   History of present illness presented to ER secondary to progressive weakness, hypoxia (noted with O2 tank empty); admitted for management of COPD exacerbation.   OT comments  Pt seen for OT tx. Pt just finishing breakfast upon arrival. Declines EOB or OOB despite encouragement but agreeable to bed level ADL session. Pt able to boost himself up in bed for optimal positioning. Bed placed in recliner position. 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 Pt edu in posture and positioning to minimize risk of pressure injuries, supports placed on L side to improve midline positioning. Pt set up for grooming tasks. Pt tends to leave LUE excluded from task making it difficult for him to apply toothpaste. Encouraged him to use L hand to stability the toothbrush while R hand applied the toothpaste to improve independence and minimize spillage. Pt verbalized understanding. Continues to benefit.       If plan is discharge home, recommend the following:  A little help with walking and/or transfers;A little help with bathing/dressing/bathroom;Supervision due to cognitive status;Assistance with cooking/housework;Assist for transportation;Help with stairs or ramp for entrance   Equipment Recommendations       Recommendations for Other Services      Precautions / Restrictions Precautions Precautions: Fall Recall of Precautions/Restrictions: Impaired Restrictions Weight Bearing Restrictions Per Provider Order: No       Mobility Bed Mobility Overal bed mobility: Modified Independent             General bed mobility comments: increased time/effort and cues to boost himself up    Transfers                   General transfer comment: declined despite encouragement     Balance Overall balance assessment: Needs assistance    Sitting balance-Leahy Scale: Fair Sitting balance - Comments: fair-, tendency to lean to L which attributes to old CVA Postural control: Left lateral lean                                 ADL either performed or assessed with clinical judgement   ADL Overall ADL's : Needs assistance/impaired     Grooming: Sitting;Oral care;Wash/dry face;Modified independent Grooming Details (indicate cue type and reason): VC to incorporate LUE as possible to stabilize toothbrush and improve ability to apply toothpaste.                                    Extremity/Trunk Assessment              Vision       Perception     Praxis     Communication     Cognition Arousal: Alert Behavior During Therapy: WFL for tasks assessed/performed                                          Cueing      Exercises Other Exercises Other Exercises: Pt edu in posture and positioning to minimize risk of pressure injuries, supports placed on L side to improve midline positioning            Pertinent Vitals/ Pain  Pain Assessment Pain Assessment: No/denies pain   Frequency  Min 2X/week        Progress Toward Goals  OT Goals(current goals can now be found in the care plan section)  Progress towards OT goals: Progressing toward goals  Acute Rehab OT Goals Patient Stated Goal: to go home OT Goal Formulation: With patient Time For Goal Achievement: 04/17/24 Potential to Achieve Goals: Good  Plan      Co-evaluation                 AM-PAC OT 6 Clicks Daily Activity     Outcome Measure   Help from another person eating meals?: None Help from another person taking care of personal grooming?: None Help from another person toileting, which includes using toliet, bedpan, or urinal?: A Little Help from another person bathing (including washing, rinsing, drying)?: A Little Help from another person to put on and taking off regular upper  body clothing?: None Help from another person to put on and taking off regular lower body clothing?: A Little 6 Click Score: 21    End of Session Equipment Utilized During Treatment: Oxygen  OT Visit Diagnosis: Unsteadiness on feet (R26.81);Muscle weakness (generalized) (M62.81);History of falling (Z91.81);Other abnormalities of gait and mobility (R26.89)   Activity Tolerance Patient tolerated treatment well   Patient Left in bed;with call bell/phone within reach;with bed alarm set   Nurse Communication          Time: 9097-9083 OT Time Calculation (min): 14 min  Charges: OT General Charges $OT Visit: 1 Visit OT Treatments $Self Care/Home Management : 8-22 mins  Warren SAUNDERS., MPH, MS, OTR/L ascom [phone] 04/13/24, 9:55 AM

## 2024-04-13 NOTE — Progress Notes (Signed)
 Progress Note   Patient: Darin Hopkins FMW:[id number] DOB: 24-Feb-1961 DOA: 04/03/2024     10 DOS: the patient was seen and examined on 04/13/2024   Brief hospital course: 63 y.o. male with a PMH significant for COPD, HTN, left inguinal hernia, GERD, CHF, history of seizure, HLD, alcohol dependence, protein calorie malnutrition, CKD 3.   They presented from home to the ED on 04/03/2024 with concerns for considerable weakness x 2 days.  Per EMS report, patient's O2 saturations on their initial evaluation were in the 50s which appears to be due to the fact that his oxygen tank was empty and chronically is supposed to be on 2 to 3 L.  They also reported significant bedbugs in his home.  He was put on home oxygen with significant improvement.   In the ED, it was found that they had stable vital signs on 2 L nasal cannula.  It appears that patient was put on BiPAP at some point earlier this morning but there is no documented desaturation or reason for this and when I evaluated patient he was stable and comfortable so transitioned back to his home nasal cannula oxygen.  Significant findings included: Essentially pan CT scanned in the ED with several findings:   -Possible left lower lobe pneumonia on chest CT.   -Large left groin hernia without obstruction or incarceration. -Abnormal findings in the pancreas on CT scan were concerning for mass but not fully evaluated so radiology recommended abdominal MRI to further evaluate. -Also has mass on lower pole of right kidney -Additionally, multiple nodules were seen in the right lower lobe of his lungs which is recommended to have noncontrast chest CT follow-up in 3 to 6 months.   They were initially treated with Mucinex , DuoNeb, Solu-Medrol , ceftriaxone , doxycycline .    Patient was admitted to medicine service for further workup and management of hypoxia as outlined in detail below  MRI of the abdomen showed motion degraded exam, moderate dilatation of  main pancreatic duct with and subtle soft tissue prominence suspicious for small pancreatic mass recommending EUS for further consideration, 2.6 cm complex subcapsular lesion of the right kidney considered intermediate to Bosniak category 68F lesion recommend continue follow-up by renal protocol abdomen CT with and 6 months.  12/3.  Received insurance authorization for rehab.  I ordered labs for today since potassium was high yesterday.  Hemoglobin drifted down to 7.3, potassium 5.  Creatinine 1.72.  Will check hemoglobin again tomorrow with type and cross.  IV fluids stopped. 12/4.  Hemoglobin drifted down to 6.7.  1 unit of packed red blood cells given and hemoglobin up to 9.4.  Patient feels well after transfusion.  No shortness of breath.  Family refused Hopkins out to rehab. 12/5.  Unable to reach family today.  Will treat high potassium today.  Assessment and Plan: * Hyperkalemia Patient received Lokelma  during the hospital course.  Today's potassium 5.2.  Will give Lokelma  today.  May end up needing Lokelma  3 times a week upon Hopkins home.  Drop in hemoglobin Hemoglobin 6.7 yesterday.  Discontinue IV fluids yesterday afternoon.  Most likely dilutional from IV fluid hydration.  Patient given 1 unit of packed red blood cells today and hemoglobin settled at 8.5.  COPD exacerbation (HCC) Treated during hospital course  Acute kidney injury superimposed on CKD Acute kidney injury on CKD stage IIIb.  Today's creatinine 1.78 with a GFR 42.  Creatinine peaked at 2.77.  Hypernatremia Sodium normalized with IV fluids.   Pancreatic mass  CA 19-9 only a few points higher than the normal range.  Recommend outpatient follow-up with Dr. Wilhelmenia for endoscopic ultrasound.  Kidney lesion, native, right MRI showing 2.6 cm complex subcapsular lesion of the right kidney considered indeterminate recommend continued follow-up with renal protocol CT in 6 months.  Left inguinal hernia Seen by general  surgery and not a surgical candidate currently.  Chronic diastolic CHF (congestive heart failure) (HCC) No signs of congestive heart failure on this hospital course even with giving IV fluids and blood.  Can go back on Farxiga  as outpatient.  No ACE or ARB or spironolactone with acute kidney injury and hyperkalemia.  Will restart low-dose bisoprolol .  Chronic respiratory failure with hypoxia and hypercapnia (HCC) On 3 L of oxygen  History of seizure On Keppra         Subjective: Patient feels okay.  Offers no complaints.  Family declined Hopkins out to rehab yesterday.  Unable to reach family today.  Physical Exam: Vitals:   04/12/24 1745 04/12/24 2030 04/13/24 0421 04/13/24 0856  BP: (!) 157/95 (!) 163/90 (!) 153/77 (!) 163/85  Pulse: 65 66 (!) 54 63  Resp: 16 15 19 17   Temp: 98.2 F (36.8 C) 98.2 F (36.8 C) 97.8 F (36.6 C)   TempSrc:      SpO2: 100% 100% 100% 99%  Weight:      Height:       Physical Exam HENT:     Head: Normocephalic.  Eyes:     General: Lids are normal.  Cardiovascular:     Rate and Rhythm: Normal rate and regular rhythm.     Heart sounds: Normal heart sounds, S1 normal and S2 normal.  Pulmonary:     Breath sounds: Examination of the right-lower field reveals decreased breath sounds. Examination of the left-lower field reveals decreased breath sounds. Decreased breath sounds present. No wheezing, rhonchi or rales.  Abdominal:     Palpations: Abdomen is soft.     Tenderness: There is no abdominal tenderness.  Musculoskeletal:     Right lower leg: No swelling.     Left lower leg: No swelling.  Skin:    General: Skin is warm.     Findings: No rash.  Neurological:     Mental Status: He is alert.     Data Reviewed: Creatinine 1.78, potassium 5.2, GFR 42, hemoglobin 8.5  Family Communication: Left message for sister and niece  Disposition: Status is: Inpatient Remains inpatient appropriate because: Family declined Hopkins out to rehab  yesterday.  Unable to reach family today.  Will have to go home with outpatient therapy.  Will treat potassium today  Planned Discharge Destination: Home    Time spent: 35 minutes  Author: Charlie Patterson, MD 04/13/2024 1:37 PM  For on call review [email or web address].

## 2024-04-13 NOTE — Plan of Care (Signed)

## 2024-04-13 NOTE — Progress Notes (Signed)
 Palliative Care Progress Note, Assessment & Plan   Patient Name: Darin Hopkins       Date: 04/13/2024 DOB: July 12, 1960  Age: 63 y.o. MRN#: [id number] Attending Physician: Josette Ade, MD Primary Care Physician: [name], NP Admit Date: 04/03/2024  Subjective: Patient is sitting up in bed in no apparent distress.  He acknowledges my presence and is able to give wishes known.  No family or friends present at bedside during my visit.  HPI: 63 y.o. male  with past medical history of COPD, HTN, left hernia, GERD, CHF, history of seizures, HLD, alcohol dependence, CKD stage III, and protein calorie malnutrition admitted on 04/03/2024 with weakness.   As per chart review, EMS reported that initial evaluation found patient's oxygen saturations to be in the 50s.  He is chronically on 2-3 L of nasal cannula but his oxygen tank was found empty.   Additionally, EMS reported significant bedbugs present in his home.   Patient was pan CT scan in the ED with several findings which included:   -Possible left lower lobe pneumonia on chest CT.   -Large left groin hernia without obstruction or incarceration. -Abnormal findings in the pancreas on CT scan were concerning for mass but not fully evaluated so radiology recommended abdominal MRI to further evaluate. -Also has mass on lower pole of right kidney which is consistent with renal cell carcinoma and can also be further evaluated with the MRI. -Additionally, multiple nodules were seen in the right lower lobe of his lungs which is recommended to have noncontrast chest CT follow-up in 3 to 6 months   He is being treated for left lower lobe pneumonia with Mucinex , DuoNeb, Solu-Medrol , ceftriaxone , and doxycycline .    Additionally, general surgery was  consulted and no urgent surgical need at this time.  Recommendation is for patient to follow-up outpatient if he wishes to pursue aggressive treatment of inguinal hernia.   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  Radiology report recommends tissue sampling. Ambulatory referral placed to Dr. Wilhelmenia in [facility] for outpatient follow up.    PMT was consulted to support patient and family with discussions on boundaries and goals of medical treatment.   Summary of counseling/coordination of care: Extensive chart review completed prior to meeting patient including labs, vital signs, imaging, progress notes, orders, and available advanced directive documents from current and previous encounters.   After reviewing the patient's chart and assessing the patient at bedside, I spoke with patient in regards to symptom management and goals of care.   Symptoms assessed.  Patient has no acute complaints at this time.  He denies headache, chest pain, stomach discomfort, N/V/D, dizziness, and other acute issues at this time.  No adjustment to [facility] needed.  I discussed next steps with patient.  He shares that he defers all decisions to his sister.  I advised him that plan remains for patient to discharge with home health PT/OT.  He shares that he is in agreement with this plan but that we need to verify everything with his sister.  After visiting with the patient, I spoke with patient's sister over the  phone.  Brief medical update given.  [name] shares she would be unable to visit patient if he moved to a skilled nursing facility in [place].  Therefore, she is hopeful patient can be discharged home with outpatient PT/OT to follow.  I shared that this is currently the plan.  She asked if patient could be discharged from the hospital on Monday.  Advised her that if patient is medically stable he will be discharged  at that time.  Discharge planning is at discretion of attending.  She shares understanding.  I discussed values and goals important to patient and patient's sister.  I shared that in the event of an emergency, patient has named his sister as his next of kin decision-maker.  She shares understanding and accepts this role.  Additionally, I discussed boundaries and goals of care as well as patient's CODE STATUS.  [name] endorses that patient would be accepting of all offered, available, and appropriate medical interventions to sustain his life.  She remains in agreement with full code and full scope of care.   Encouraged sister to consider DNR/DNI status understanding evidenced based poor outcomes in similar hospitalized patients, as the cause of the arrest is likely associated with chronic/terminal disease rather than a reversible acute cardio-pulmonary event.    Goals are clear.  Plan is set.  PMT will step back from daily visits.  Please reengage where appropriate, at patient/family's request, patient's health deteriorates during hospitalization.  Physical Exam Vitals reviewed.  Constitutional:      General: He is not in acute distress. HENT:     Head: Normocephalic.     Mouth/Throat:     Mouth: Mucous membranes are moist.  Eyes:     Pupils: Pupils are equal, round, and reactive to light.  Pulmonary:     Effort: Pulmonary effort is normal.  Abdominal:     Palpations: Abdomen is soft.  Skin:    General: Skin is warm.  Neurological:     Mental Status: He is alert and oriented to person, place, and time.  Psychiatric:        Mood and Affect: Mood normal.        Behavior: Behavior normal.             35 minute visit includes: Detailed review of medical records (labs, imaging, vital signs), medically appropriate exam (mental status, respiratory, cardiac, skin), discussed with treatment team, counseling and educating patient, family and staff, documenting clinical information,  medication management and coordination of care.  Lamarr L. Arvid, DNP, FNP-BC Palliative Medicine Team

## 2024-04-14 DIAGNOSIS — J441 Chronic obstructive pulmonary disease with (acute) exacerbation: Secondary | ICD-10-CM | POA: Diagnosis not present

## 2024-04-14 DIAGNOSIS — R531 Weakness: Principal | ICD-10-CM

## 2024-04-14 DIAGNOSIS — E875 Hyperkalemia: Secondary | ICD-10-CM | POA: Diagnosis not present

## 2024-04-14 DIAGNOSIS — N179 Acute kidney failure, unspecified: Secondary | ICD-10-CM | POA: Diagnosis not present

## 2024-04-14 LAB — BASIC METABOLIC PANEL WITH GFR
Anion gap: 2 — ABNORMAL LOW (ref 5–15)
BUN: 28 mg/dL — ABNORMAL HIGH (ref 8–23)
CO2: 34 mmol/L — ABNORMAL HIGH (ref 22–32)
Calcium: 8.2 mg/dL — ABNORMAL LOW (ref 8.9–10.3)
Chloride: 104 mmol/L (ref 98–111)
Creatinine, Ser: 1.71 mg/dL — ABNORMAL HIGH (ref 0.61–1.24)
GFR, Estimated: 44 mL/min — ABNORMAL LOW (ref >60)
Glucose, Bld: 73 mg/dL (ref 70–99)
Potassium: 5.2 mmol/L — ABNORMAL HIGH (ref 3.5–5.1)
Sodium: 139 mmol/L (ref 135–145)

## 2024-04-14 LAB — HEMOGLOBIN: Hemoglobin: 7.7 g/dL — ABNORMAL LOW (ref 13.0–17.0)

## 2024-04-14 MED ORDER — SODIUM ZIRCONIUM CYCLOSILICATE 10 G PO PACK
10.0000 g | PACK | Freq: Two times a day (BID) | ORAL | Status: DC
  Administered 2024-04-14 – 2024-04-16 (×5): 10 g via ORAL
  Filled 2024-04-14 (×5): qty 1

## 2024-04-14 MED ORDER — IRON SUCROSE 300 MG IVPB - SIMPLE MED
300.0000 mg | Freq: Once | Status: AC
  Administered 2024-04-14: 300 mg via INTRAVENOUS
  Filled 2024-04-14: qty 300

## 2024-04-14 MED ORDER — PANTOPRAZOLE SODIUM 40 MG PO TBEC
40.0000 mg | DELAYED_RELEASE_TABLET | Freq: Two times a day (BID) | ORAL | Status: DC
  Administered 2024-04-14 – 2024-04-21 (×15): 40 mg via ORAL
  Filled 2024-04-14 (×15): qty 1

## 2024-04-14 NOTE — TOC Progression Note (Addendum)
 Transition of Care [facility]) - Progression Note    Patient Details  Name: Darin Hopkins MRN: [id number] Date of Birth: May 22, 1960  Transition of Care [facility]) CM/SW Contact  Luane Rochon L Blu [place], [place] Phone Number: 04/14/2024, 1:54 PM  Clinical Narrative:     DME ordered through [employer] for home delivery. Portable tank will be provided at discharge.  Expected Discharge Plan: Home/Self Care Barriers to Discharge: No Barriers Identified               Expected Discharge Plan and Services         Expected Discharge Date: 04/12/24                                     Social Drivers of Health (SDOH) Interventions SDOH Screenings   Food Insecurity: No Food Insecurity (04/03/2024)  Housing: Low Risk  (04/03/2024)  Transportation Needs: No Transportation Needs (04/03/2024)  Utilities: Not At Risk (04/03/2024)  Depression (PHQ2-9): Low Risk  (02/11/2022)  Tobacco Use: Medium Risk (04/03/2024)    Readmission Risk Interventions     No data to display

## 2024-04-14 NOTE — Progress Notes (Signed)
    Durable Medical Equipment  (From admission, onward)           Start     Ordered   04/14/24 1328  For home use only DME oxygen  Once       Question Answer Comment  Length of Need Lifetime   Mode or (Route) Nasal cannula   Liters per Minute 3   Frequency Continuous (stationary and portable oxygen unit needed)   Oxygen conserving device Yes   Oxygen delivery system: Gas      04/14/24 1328   04/14/24 1327  For home use only DME Hospital bed  Once       Question Answer Comment  Length of Need Lifetime   The above medical condition requires: Patient requires the ability to reposition frequently   Head must be elevated greater than: 30 degrees   Bed type Semi-electric   Support Surface: Gel Overlay      04/14/24 1326

## 2024-04-14 NOTE — Progress Notes (Signed)
 1821 Home O2 delivered to bedside for pt d/c

## 2024-04-14 NOTE — Progress Notes (Signed)
 Progress Note   Patient: Darin Hopkins FMW:[id number] DOB: 1960/09/25 DOA: 04/03/2024     11 DOS: the patient was seen and examined on 04/14/2024   Brief hospital course: 63 y.o. male with a PMH significant for COPD, HTN, left inguinal hernia, GERD, CHF, history of seizure, HLD, alcohol dependence, protein calorie malnutrition, CKD 3.   They presented from home to the ED on 04/03/2024 with concerns for considerable weakness x 2 days.  Per EMS report, patient's O2 saturations on their initial evaluation were in the 50s which appears to be due to the fact that his oxygen tank was empty and chronically is supposed to be on 2 to 3 L.  They also reported significant bedbugs in his home.  He was put on home oxygen with significant improvement.   In the ED, it was found that they had stable vital signs on 2 L nasal cannula.  It appears that patient was put on BiPAP at some point earlier this morning but there is no documented desaturation or reason for this and when I evaluated patient he was stable and comfortable so transitioned back to his home nasal cannula oxygen.  Significant findings included: Essentially pan CT scanned in the ED with several findings:   -Possible left lower lobe pneumonia on chest CT.   -Large left groin hernia without obstruction or incarceration. -Abnormal findings in the pancreas on CT scan were concerning for mass but not fully evaluated so radiology recommended abdominal MRI to further evaluate. -Also has mass on lower pole of right kidney -Additionally, multiple nodules were seen in the right lower lobe of his lungs which is recommended to have noncontrast chest CT follow-up in 3 to 6 months.   They were initially treated with Mucinex , DuoNeb, Solu-Medrol , ceftriaxone , doxycycline .    Patient was admitted to medicine service for further workup and management of hypoxia as outlined in detail below  MRI of the abdomen showed motion degraded exam, moderate dilatation of  main pancreatic duct with and subtle soft tissue prominence suspicious for small pancreatic mass recommending EUS for further consideration, 2.6 cm complex subcapsular lesion of the right kidney considered intermediate to Bosniak category 71F lesion recommend continue follow-up by renal protocol abdomen CT with and 6 months.  12/3.  Received insurance authorization for rehab.  I ordered labs for today since potassium was high yesterday.  Hemoglobin drifted down to 7.3, potassium 5.  Creatinine 1.72.  Will check hemoglobin again tomorrow with type and cross.  IV fluids stopped. 12/4.  Hemoglobin drifted down to 6.7.  1 unit of packed red blood cells given and hemoglobin up to 9.4.  Patient feels well after transfusion.  No shortness of breath.  Family refused going out to rehab. 12/5.  Unable to reach family today.  Will treat high potassium today.  Assessment and Plan: * Hyperkalemia Patient received Lokelma  during the hospital course.  Today's potassium 5.2.  Will give Lokelma  bid.  Likely will need Lokelma  upon going home.  Low potassium diet  Drop in hemoglobin Hemoglobin 6.7 the other day and given a unit of blood.  Hemoglobin right after blood transfusion was 9.4.  Yesterday's hemoglobin was 8.5.  Today's hemoglobin 7.7.  No signs of bleeding.  Will give IV iron .  On Protonix .  Watch hemoglobin and other daily  COPD exacerbation (HCC) Treated during hospital course  Acute kidney injury superimposed on CKD Acute kidney injury on CKD stage IIIb.  Today's creatinine 1.71 with a GFR 44.  Creatinine peaked  at 2.77.  Hypernatremia Sodium normalized with IV fluids.   Pancreatic mass CA 19-9 only a few points higher than the normal range.  Recommend outpatient follow-up with Dr. Wilhelmenia for endoscopic ultrasound.  Kidney lesion, native, right MRI showing 2.6 cm complex subcapsular lesion of the right kidney considered indeterminate recommend continued follow-up with renal protocol CT in 6  months.  Left inguinal hernia Seen by general surgery and not a surgical candidate currently.  Chronic diastolic CHF (congestive heart failure) (HCC) No signs of congestive heart failure on this hospital course even with giving IV fluids and blood.  Can go back on Farxiga  as outpatient.  No ACE or ARB or spironolactone with acute kidney injury and hyperkalemia.  Will restart low-dose bisoprolol .  Chronic respiratory failure with hypoxia and hypercapnia (HCC) On 3 L of oxygen  History of seizure On Keppra         Subjective: Patient feels okay.  Offers no complaints.  Chronically wears oxygen.  No bleeding noticed.  Admitted 11 days ago with weakness COPD exacerbation  Physical Exam: Vitals:   04/13/24 1618 04/13/24 1955 04/14/24 0427 04/14/24 1128  BP: (!) 152/80 (!) 184/99 123/78 (!) 153/79  Pulse: (!) 59 67 (!) 53 (!) 53  Resp: 17 18 16 16   Temp: 98 F (36.7 C)  97.6 F (36.4 C) 98.4 F (36.9 C)  TempSrc:   Oral   SpO2: 100% 95% 100% 100%  Weight:      Height:       Physical Exam HENT:     Head: Normocephalic.  Eyes:     General: Lids are normal.  Cardiovascular:     Rate and Rhythm: Normal rate and regular rhythm.     Heart sounds: Normal heart sounds, S1 normal and S2 normal.  Pulmonary:     Breath sounds: Examination of the right-lower field reveals decreased breath sounds. Examination of the left-lower field reveals decreased breath sounds. Decreased breath sounds present. No wheezing, rhonchi or rales.  Abdominal:     Palpations: Abdomen is soft.     Tenderness: There is no abdominal tenderness.  Musculoskeletal:     Right lower leg: No swelling.     Left lower leg: No swelling.  Skin:    General: Skin is warm.     Findings: No rash.  Neurological:     Mental Status: He is alert.     Data Reviewed: Potassium 5.2, creatinine 1.71, GFR 44, hemoglobin 7.7  Family Communication: Spoke with niece on the phone  Disposition: Status is:  Inpatient Remains inpatient appropriate because: With hemoglobin drifting again I will give IV iron  and watch hemoglobin another day.  He states that he needs a hospital bed and portable tanks with the oxygen  Planned Discharge Destination: Home with Home Health    Time spent: 28 minutes  Author: Charlie Patterson, MD 04/14/2024 1:38 PM  For on call review [email or web address].

## 2024-04-15 ENCOUNTER — Other Ambulatory Visit: Payer: Self-pay

## 2024-04-15 DIAGNOSIS — N179 Acute kidney failure, unspecified: Secondary | ICD-10-CM | POA: Diagnosis not present

## 2024-04-15 DIAGNOSIS — R710 Precipitous drop in hematocrit: Secondary | ICD-10-CM | POA: Diagnosis not present

## 2024-04-15 DIAGNOSIS — E875 Hyperkalemia: Secondary | ICD-10-CM | POA: Diagnosis not present

## 2024-04-15 DIAGNOSIS — J441 Chronic obstructive pulmonary disease with (acute) exacerbation: Secondary | ICD-10-CM | POA: Diagnosis not present

## 2024-04-15 LAB — BASIC METABOLIC PANEL WITH GFR
Anion gap: 3 — ABNORMAL LOW (ref 5–15)
BUN: 28 mg/dL — ABNORMAL HIGH (ref 8–23)
CO2: 33 mmol/L — ABNORMAL HIGH (ref 22–32)
Calcium: 8.5 mg/dL — ABNORMAL LOW (ref 8.9–10.3)
Chloride: 104 mmol/L (ref 98–111)
Creatinine, Ser: 1.78 mg/dL — ABNORMAL HIGH (ref 0.61–1.24)
GFR, Estimated: 42 mL/min — ABNORMAL LOW (ref >60)
Glucose, Bld: 77 mg/dL (ref 70–99)
Potassium: 5.1 mmol/L (ref 3.5–5.1)
Sodium: 140 mmol/L (ref 135–145)

## 2024-04-15 LAB — TYPE AND SCREEN
ABO/RH(D): B POS
Antibody Screen: NEGATIVE

## 2024-04-15 LAB — HEMOGLOBIN: Hemoglobin: 8.1 g/dL — ABNORMAL LOW (ref 13.0–17.0)

## 2024-04-15 NOTE — Plan of Care (Signed)
  Problem: Clinical Measurements: Goal: Cardiovascular complication will be avoided Outcome: Progressing   Problem: Activity: Goal: Risk for activity intolerance will decrease Outcome: Progressing   Problem: Nutrition: Goal: Adequate nutrition will be maintained Outcome: Progressing   

## 2024-04-15 NOTE — Progress Notes (Signed)
 Progress Note   Patient: Darin Hopkins FMW:[id number] DOB: January 03, 1961 DOA: 04/03/2024     12 DOS: the patient was seen and examined on 04/15/2024   Brief hospital course: 63 y.o. male with a PMH significant for COPD, HTN, left inguinal hernia, GERD, CHF, history of seizure, HLD, alcohol dependence, protein calorie malnutrition, CKD 3.   They presented from home to the ED on 04/03/2024 with concerns for considerable weakness x 2 days.  Per EMS report, patient's O2 saturations on their initial evaluation were in the 50s which appears to be due to the fact that his oxygen tank was empty and chronically is supposed to be on 2 to 3 L.  They also reported significant bedbugs in his home.  He was put on home oxygen with significant improvement.   In the ED, it was found that they had stable vital signs on 2 L nasal cannula.  It appears that patient was put on BiPAP at some point earlier this morning but there is no documented desaturation or reason for this and when I evaluated patient he was stable and comfortable so transitioned back to his home nasal cannula oxygen.  Significant findings included: Essentially pan CT scanned in the ED with several findings:   -Possible left lower lobe pneumonia on chest CT.   -Large left groin hernia without obstruction or incarceration. -Abnormal findings in the pancreas on CT scan were concerning for mass but not fully evaluated so radiology recommended abdominal MRI to further evaluate. -Also has mass on lower pole of right kidney -Additionally, multiple nodules were seen in the right lower lobe of his lungs which is recommended to have noncontrast chest CT follow-up in 3 to 6 months.   They were initially treated with Mucinex , DuoNeb, Solu-Medrol , ceftriaxone , doxycycline .    Patient was admitted to medicine service for further workup and management of hypoxia as outlined in detail below  MRI of the abdomen showed motion degraded exam, moderate dilatation of  main pancreatic duct with and subtle soft tissue prominence suspicious for small pancreatic mass recommending EUS for further consideration, 2.6 cm complex subcapsular lesion of the right kidney considered intermediate to Bosniak category 63F lesion recommend continue follow-up by renal protocol abdomen CT with and 6 months.  12/3.  Received insurance authorization for rehab.  I ordered labs for today since potassium was high yesterday.  Hemoglobin drifted down to 7.3, potassium 5.  Creatinine 1.72.  Will check hemoglobin again tomorrow with type and cross.  IV fluids stopped. 12/4.  Hemoglobin drifted down to 6.7.  1 unit of packed red blood cells given and hemoglobin up to 9.4.  Patient feels well after transfusion.  No shortness of breath.  Family refused going out to rehab. 12/5.  Unable to reach family today.  Will treat high potassium today. 12/6.  Hemoglobin down to 7.7.  Given IV iron .  Potassium 5.2. 12/7.  Tried to contact family about discharge.  The patient has no money because his wallet is missing.  I advised that we could have gotten medications to the [facility] pharmacy.  Patient would like his medications to go to the [place] clinic but family concerned that since he does not have an ID he will be able to get them.  Assessment and Plan: * Hyperkalemia Patient received Lokelma  during the hospital course.  Today's potassium 5.1.  Low potassium diet.  Will need Lokelma  upon going home and likely will do that through the [facility] pharmacy  Drop in hemoglobin Hemoglobin 6.7  the other day and given a unit of blood.  Hemoglobin right after blood transfusion was 9.4.  Hemoglobin on 12 6 was 7.7.  Today's hemoglobin 8.1.  No signs of bleeding.  Status post IV iron .  On Protonix .  COPD exacerbation (HCC) Treated during hospital course  Acute kidney injury superimposed on CKD Acute kidney injury on CKD stage IIIb.  Today's creatinine 1.78 with a GFR 42.  Creatinine peaked at 2.77.  Patient will need  outpatient nephrology.  Hypernatremia Sodium normalized with IV fluids.   Pancreatic mass CA 19-9 only a few points higher than the normal range.  Recommend outpatient follow-up with Dr. Wilhelmenia for endoscopic ultrasound.  Kidney lesion, native, right MRI showing 2.6 cm complex subcapsular lesion of the right kidney considered indeterminate recommend continued follow-up with renal protocol CT in 6 months.  Left inguinal hernia Seen by general surgery and not a surgical candidate currently.  Weakness We had rehab set up the other day but family refused and now that we will have to think to take patient home and do outpatient therapy.  Chronic diastolic CHF (congestive heart failure) (HCC) No signs of congestive heart failure on this hospital course even with giving IV fluids and blood.  Can likely go back on Farxiga  as outpatient.  No ACE or ARB or spironolactone with acute kidney injury and hyperkalemia.  On low-dose bisoprolol .  Chronic respiratory failure with hypoxia and hypercapnia (HCC) On 3 L of oxygen  History of seizure On Keppra         Subjective: Patient feels okay.  Offers no complaints.  Would like to get his medications through the [place] clinic if possible since they are free.  He is concerned about not being able to get his medications at this Clinic secondary to not having an ID  Physical Exam: Vitals:   04/14/24 1603 04/14/24 2054 04/15/24 0441 04/15/24 0820  BP: (!) 159/81 (!) 163/82 123/73 133/77  Pulse: 62 (!) 58 (!) 51 (!) 54  Resp: 17 16 16 17   Temp: 98.6 F (37 C) (!) 97.5 F (36.4 C) 98.5 F (36.9 C) 97.6 F (36.4 C)  TempSrc: Oral     SpO2: 98% 100% 100% 98%  Weight:      Height:       Physical Exam HENT:     Head: Normocephalic.  Eyes:     General: Lids are normal.  Cardiovascular:     Rate and Rhythm: Normal rate and regular rhythm.     Heart sounds: Normal heart sounds, S1 normal and S2 normal.  Pulmonary:     Breath sounds:  Examination of the right-lower field reveals decreased breath sounds. Examination of the left-lower field reveals decreased breath sounds. Decreased breath sounds present. No wheezing, rhonchi or rales.  Abdominal:     Palpations: Abdomen is soft.     Tenderness: There is no abdominal tenderness.  Musculoskeletal:     Right lower leg: No swelling.     Left lower leg: No swelling.  Skin:    General: Skin is warm.     Findings: No rash.  Neurological:     Mental Status: He is alert.     Data Reviewed: Potassium 5.1, creatinine 0.78, GFR 42, hemoglobin 8.1  Family Communication: Spoke with niece on the phone 3 times.  Unable to contact sister.  They did not get back to me about getting meds through the [facility] pharmacy today.  Disposition: Status is: Inpatient Remains inpatient appropriate because:  Was planning  on discharging today but unable to contact family before the [facility] pharmacy closed today.  Patient would like to get his [facility] but they are closed today.  I offered to get medications through the [facility] pharmacy but patient does not have his wallet and is concerned about cost.  I spoke with the [facility] pharmacy and they can get his medications.  He is concerned that the [place] clinic will not give his medications secondary to not having an ID.  Will need to contact the [place] [facility] pharmacy to see if he can get medications over there without having an ID.  Apparently sister requested the hospital bed to be delivered on Monday.    Planned Discharge Destination: Home with Home Health    Time spent: 45 minutes Case discussed with [facility] pharmacy staff, [facility] and nursing staff  Author: Charlie Patterson, MD 04/15/2024 5:18 PM  For on call review [email or web address].

## 2024-04-15 NOTE — Plan of Care (Signed)
   Problem: Activity: Goal: Risk for activity intolerance will decrease Outcome: Progressing   Problem: Nutrition: Goal: Adequate nutrition will be maintained Outcome: Progressing   Problem: Coping: Goal: Level of anxiety will decrease Outcome: Progressing

## 2024-04-15 NOTE — Telephone Encounter (Addendum)
 The soonest I can accommodate this patient unfortunately is 12/29. Referring provider needs to be aware of this and if they feel comfortable with this plan, then they will need to refer elsewhere (if in [place] then [place] or to a [facility] center) otherwise I am happy to be available but I have nothing sooner than that particular time. If they are amenable, then upper EUS [facility] should be scheduled. Thanks. GM

## 2024-04-15 NOTE — Assessment & Plan Note (Signed)
 We had rehab set up the other day but family refused and now they will take home with outpatient physical therapy.

## 2024-04-16 ENCOUNTER — Other Ambulatory Visit: Payer: Self-pay

## 2024-04-16 LAB — BASIC METABOLIC PANEL WITH GFR
Anion gap: 4 — ABNORMAL LOW (ref 5–15)
BUN: 27 mg/dL — ABNORMAL HIGH (ref 8–23)
CO2: 33 mmol/L — ABNORMAL HIGH (ref 22–32)
Calcium: 8.5 mg/dL — ABNORMAL LOW (ref 8.9–10.3)
Chloride: 103 mmol/L (ref 98–111)
Creatinine, Ser: 1.82 mg/dL — ABNORMAL HIGH (ref 0.61–1.24)
GFR, Estimated: 41 mL/min — ABNORMAL LOW (ref >60)
Glucose, Bld: 72 mg/dL (ref 70–99)
Potassium: 4.7 mmol/L (ref 3.5–5.1)
Sodium: 140 mmol/L (ref 135–145)

## 2024-04-16 LAB — HEMOGLOBIN: Hemoglobin: 7.9 g/dL — ABNORMAL LOW (ref 13.0–17.0)

## 2024-04-16 MED ORDER — IRON SUCROSE 300 MG IVPB - SIMPLE MED
300.0000 mg | Freq: Once | Status: AC
  Administered 2024-04-16: 300 mg via INTRAVENOUS
  Filled 2024-04-16: qty 300

## 2024-04-16 MED ORDER — OMEPRAZOLE 20 MG PO CPDR: 20.0000 mg | DELAYED_RELEASE_CAPSULE | Freq: Every day | ORAL | 0 refills | Status: AC

## 2024-04-16 MED ORDER — LEVETIRACETAM 750 MG PO TABS: 750.0000 mg | ORAL_TABLET | Freq: Two times a day (BID) | ORAL | 0 refills | Status: AC

## 2024-04-16 MED ORDER — MULTIVITAMIN ADULT PO TABS: 2.0000 | ORAL_TABLET | Freq: Every day | ORAL | 0 refills | Status: AC

## 2024-04-16 MED ORDER — ATORVASTATIN CALCIUM 20 MG PO TABS: 20.0000 mg | ORAL_TABLET | Freq: Every evening | ORAL | 0 refills | Status: AC

## 2024-04-16 MED ORDER — THIAMINE HCL 100 MG PO TABS: 100.0000 mg | ORAL_TABLET | Freq: Every day | ORAL | 0 refills | Status: AC

## 2024-04-16 MED ORDER — SODIUM ZIRCONIUM CYCLOSILICATE 10 G PO PACK
10.0000 g | PACK | Freq: Every day | ORAL | Status: DC
  Administered 2024-04-17 – 2024-04-21 (×5): 10 g via ORAL
  Filled 2024-04-16 (×5): qty 1

## 2024-04-16 MED ORDER — BISOPROLOL FUMARATE 2.5 MG PO TABS: 2.5000 mg | ORAL_TABLET | Freq: Every day | ORAL | 0 refills | Status: DC

## 2024-04-16 MED ORDER — SODIUM ZIRCONIUM CYCLOSILICATE 10 G PO PACK
10.0000 g | PACK | Freq: Every day | ORAL | 0 refills | Status: DC
  Filled 2024-04-16: qty 30, 30d supply, fill #0

## 2024-04-16 MED ORDER — TRAZODONE HCL 50 MG PO TABS: 50.0000 mg | ORAL_TABLET | Freq: Every day | ORAL | 0 refills | Status: AC

## 2024-04-16 MED ORDER — FARXIGA 10 MG PO TABS: 10.0000 mg | ORAL_TABLET | Freq: Every day | ORAL | 0 refills | Status: DC

## 2024-04-16 MED ORDER — VITAMIN D3 25 MCG (1000 UNIT) PO TABS: 1000.0000 [IU] | ORAL_TABLET | Freq: Every day | ORAL | 0 refills | Status: AC

## 2024-04-16 MED ORDER — PROAIR HFA 108 (90 BASE) MCG/ACT IN AERS: 2.0000 | INHALATION_SPRAY | Freq: Four times a day (QID) | RESPIRATORY_TRACT | 0 refills | Status: AC | PRN

## 2024-04-16 MED ORDER — FEROSUL 325 (65 FE) MG PO TABS: 325.0000 mg | ORAL_TABLET | Freq: Every day | ORAL | 0 refills | Status: AC

## 2024-04-16 MED ORDER — STIOLTO RESPIMAT 2.5-2.5 MCG/ACT IN AERS: 2.0000 | INHALATION_SPRAY | Freq: Every day | RESPIRATORY_TRACT | 0 refills | Status: AC

## 2024-04-16 MED ORDER — FOLIC ACID 1 MG PO TABS: 1.0000 mg | ORAL_TABLET | Freq: Every day | ORAL | 0 refills | Status: AC

## 2024-04-16 NOTE — Discharge Summary (Signed)
 Physician Discharge Summary   Patient: Darin Hopkins MRN: [id number] DOB: 05/11/1960  Admit date:     04/03/2024  Discharge date: 04/16/24  Discharge Physician: Charlie Patterson   PCP: [name], NP   Recommendations at discharge:   Follow-up PCP 5 days Referral for IV iron  infusions Referral to Dr. Wilhelmenia for endoscopic ultrasound for pancreatic lesion Referral to nephrology  Discharge Diagnoses: Principal Problem:   Hyperkalemia Active Problems:   Drop in hemoglobin   COPD exacerbation (HCC)   Acute kidney injury superimposed on CKD   Hypernatremia   Pancreatic mass   Kidney lesion, native, right   Left inguinal hernia   History of seizure   Chronic respiratory failure with hypoxia and hypercapnia (HCC)   Chronic diastolic CHF (congestive heart failure) (HCC)   Weakness    Hospital Course: 63 y.o. male with a PMH significant for COPD, HTN, left inguinal hernia, GERD, CHF, history of seizure, HLD, alcohol dependence, protein calorie malnutrition, CKD 3.   They presented from home to the ED on 04/03/2024 with concerns for considerable weakness x 2 days.  Per EMS report, patient's O2 saturations on their initial evaluation were in the 50s which appears to be due to the fact that his oxygen tank was empty and chronically is supposed to be on 2 to 3 L.  They also reported significant bedbugs in his home.  He was put on home oxygen with significant improvement.   In the ED, it was found that they had stable vital signs on 2 L nasal cannula.  It appears that patient was put on BiPAP at some point earlier this morning but there is no documented desaturation or reason for this and when I evaluated patient he was stable and comfortable so transitioned back to his home nasal cannula oxygen.  Significant findings included: Essentially pan CT scanned in the ED with several findings:   -Possible left lower lobe pneumonia on chest CT.   -Large left groin hernia without  obstruction or incarceration. -Abnormal findings in the pancreas on CT scan were concerning for mass but not fully evaluated so radiology recommended abdominal MRI to further evaluate. -Also has mass on lower pole of right kidney -Additionally, multiple nodules were seen in the right lower lobe of his lungs which is recommended to have noncontrast chest CT follow-up in 3 to 6 months.   They were initially treated with Mucinex , DuoNeb, Solu-Medrol , ceftriaxone , doxycycline .    Patient was admitted to medicine service for further workup and management of hypoxia as outlined in detail below  MRI of the abdomen showed motion degraded exam, moderate dilatation of main pancreatic duct with and subtle soft tissue prominence suspicious for small pancreatic mass recommending EUS for further consideration, 2.6 cm complex subcapsular lesion of the right kidney considered intermediate to Bosniak category 75F lesion recommend continue follow-up by renal protocol abdomen CT with and 6 months.  12/3.  Received insurance authorization for rehab.  I ordered labs for today since potassium was high yesterday.  Hemoglobin drifted down to 7.3, potassium 5.  Creatinine 1.72.  Will check hemoglobin again tomorrow with type and cross.  IV fluids stopped. 12/4.  Hemoglobin drifted down to 6.7.  1 unit of packed red blood cells given and hemoglobin up to 9.4.  Patient feels well after transfusion.  No shortness of breath.  Family refused going out to rehab. 12/5.  Unable to reach family today.  Will treat high potassium today. 12/6.  Hemoglobin down to 7.7.  Given IV iron .  Potassium 5.2. 12/7.  Tried to contact family about discharge.  The patient has no money because his wallet is missing.  I advised that we could have gotten medications to the [facility] pharmacy.  Patient would like his medications to go to the [place] [facility] but family concerned that since he does not have an ID he will be able to get them. 12/8.  Patient feeling  well.  Hemoglobin 7.9.  Give another dose of IV iron .  Creatinine 1.82 and potassium 4.7.  Lokelma  prescription went into [facility] pharmacy which was delivered.  Other prescription sent to [facility] clinic pharmacy.  Patient able to pick up prescriptions at the [facility] clinic pharmacy without an ID.  Assessment and Plan: * Hyperkalemia Patient received Lokelma  during the hospital course.  Today's potassium 4.7.  Low potassium diet.  Will need Lokelma  upon going home and this was prescribed through the [facility] pharmacy.  Drop in hemoglobin Hemoglobin 6.7 the other day and given a unit of blood.  Status post IV iron  x 2 doses.  On Protonix  here and omeprazole  as outpatient.  Stop aspirin .  No signs of bleeding.  Did receive fluids during the hospital course.  Hemoglobin upon discharge 7.9.  Continue to watch hemoglobin as outpatient.  COPD exacerbation (HCC) Treated during hospital course  Acute kidney injury superimposed on CKD Acute kidney injury on CKD stage IIIb.  Today's creatinine 1.82 with a GFR 41.  Creatinine peaked at 2.77.  Refer to nephrology as outpatient.  Hypernatremia Sodium normalized with IV fluids.   Pancreatic mass CA 19-9 only a few points higher than the normal range.  Recommend outpatient follow-up with Dr. Wilhelmenia for endoscopic ultrasound.  Kidney lesion, native, right MRI showing 2.6 cm complex subcapsular lesion of the right kidney considered indeterminate recommend continued follow-up with renal protocol CT in 6 months.  With chronic kidney disease may have to repeat with an MRI.  Left inguinal hernia Seen by general surgery and not a surgical candidate currently.  Weakness We had rehab set up the other day but family refused and now they will take home with outpatient physical therapy.  Chronic diastolic CHF (congestive heart failure) (HCC) No signs of congestive heart failure on this hospital course even with giving IV fluids and blood.  Can likely go back on Farxiga   as outpatient.  No ACE or ARB or spironolactone with acute kidney injury and hyperkalemia.  On low-dose bisoprolol .  Chronic respiratory failure with hypoxia and hypercapnia (HCC) On 3 L of oxygen.  Patient set up with portable tanks.  Already has a concentrator.  History of seizure On Keppra          Consultants: Palliative care, general surgery Procedures performed: None Disposition: Initial plan was to go out to rehab but family refused the rehab facility.  Patient will have to go home and do outpatient physical therapy. Diet recommendation:  Renal diet DISCHARGE MEDICATION: Allergies as of 04/16/2024   No Known Allergies      Medication List     STOP taking these medications    amLODipine  10 MG tablet Commonly known as: NORVASC    aspirin  EC 81 MG tablet   furosemide  20 MG tablet Commonly known as: LASIX    hydrALAZINE  50 MG tablet Commonly known as: APRESOLINE    potassium chloride  10 MEQ tablet Commonly known as: KLOR-CON  M   thiamine  100 MG tablet       TAKE these medications    acetaminophen  325 MG tablet Commonly known  as: TYLENOL  Take 2 tablets (650 mg total) by mouth every 6 (six) hours as needed for mild pain (pain score 1-3) or fever (or Fever >/= 101).   atorvastatin  20 MG tablet Commonly known as: LIPITOR Take 1 tablet (20 mg total) by mouth every evening.   Bisoprolol  Fumarate 2.5 MG Tabs Take 2.5 mg by mouth at bedtime. What changed:  medication strength how much to take how to take this when to take this additional instructions   cholecalciferol 25 MCG (1000 UNIT) tablet Commonly known as: VITAMIN D3 Take 1 tablet (1,000 Units total) by mouth daily.   Farxiga  10 MG Tabs tablet Generic drug: dapagliflozin  propanediol Take 1 tablet (10 mg total) by mouth daily before breakfast.   FeroSul 325 (65 Fe) MG tablet Generic drug: ferrous sulfate Take 1 tablet (325 mg total) by mouth daily with breakfast.   folic acid  1 MG  tablet Commonly known as: FOLVITE  Take 1 tablet (1 mg total) by mouth daily.   levETIRAcetam  750 MG tablet Commonly known as: KEPPRA  Take 1 tablet (750 mg total) by mouth 2 (two) times daily.   Lokelma  10 g Pack packet Generic drug: sodium zirconium cyclosilicate  Take 10 g by mouth daily.   Multivitamin Adult Tabs Take 2 tablets by mouth daily.   omeprazole  20 MG capsule Commonly known as: PRILOSEC Take 1 capsule (20 mg total) by mouth daily before breakfast.   ProAir  HFA 108 (90 Base) MCG/ACT inhaler Generic drug: albuterol  Inhale 2 puffs into the lungs every 6 (six) hours as needed for shortness of breath or wheezing.   Stiolto Respimat  2.5-2.5 MCG/ACT Aers Generic drug: Tiotropium Bromide -Olodaterol Inhale 2 puffs into the lungs daily. Pt needs to make an appointment to continue to receive refills please.   thiamine  100 MG tablet Commonly known as: VITAMIN B1 Take 1 tablet (100 mg total) by mouth daily.   traZODone  50 MG tablet Commonly known as: DESYREL  Take 1 tablet (50 mg total) by mouth at bedtime.               Durable Medical Equipment  (From admission, onward)           Start     Ordered   04/14/24 1328  For home use only DME oxygen  Once       Question Answer Comment  Length of Need Lifetime   Mode or (Route) Nasal cannula   Liters per Minute 3   Frequency Continuous (stationary and portable oxygen unit needed)   Oxygen conserving device Yes   Oxygen delivery system: Gas      04/14/24 1328   04/14/24 1327  For home use only DME Hospital bed  Once       Question Answer Comment  Length of Need Lifetime   The above medical condition requires: Patient requires the ability to reposition frequently   Head must be elevated greater than: 30 degrees   Bed type Semi-electric   Support Surface: Gel Overlay      04/14/24 1326            Contact information for follow-up providers     Mansouraty, Aloha Raddle., MD Follow up in 3 week(s).    Specialties: Gastroenterology, Internal Medicine Why: will need endoscopic ultrasound of pancreaus Contact information: [address] [place] [address] [phone]         [name], NP Follow up in 5 day(s).   Specialty: Nurse Practitioner Contact information: [address] [place] [address]  [phone]         Douglas Rule, MD Follow up in 2 week(s).   Specialty: Nephrology Why: CKD after hospital referral Contact information: [address] [place] [address] [phone]              Contact information for after-discharge care     Destination     [facility] .   Service: Skilled Nursing Contact information: [address] [place]  [address] [phone]                    Discharge Exam: Fredricka Weights   04/03/24 0341 04/03/24 2236  Weight: 51.7 kg 53.4 kg   Physical Exam HENT:     Head: Normocephalic.  Eyes:     General: Lids are normal.  Cardiovascular:     Rate and Rhythm: Normal rate and regular rhythm.     Heart sounds: Normal heart sounds, S1 normal and S2 normal.  Pulmonary:     Breath sounds: Examination of the right-lower field reveals decreased breath sounds. Examination of the left-lower field reveals decreased breath sounds. Decreased breath sounds present. No wheezing, rhonchi or rales.  Abdominal:     Palpations: Abdomen is soft.     Tenderness: There is no abdominal tenderness.  Musculoskeletal:     Right lower leg: No swelling.     Left lower leg: No swelling.  Skin:    General: Skin is warm.     Findings: No rash.  Neurological:     Mental Status: He is alert.      Condition at discharge: fair  The results of significant diagnostics from this hospitalization (including imaging, microbiology, ancillary and laboratory) are listed below for reference.   Imaging Studies: MR ABDOMEN W WO CONTRAST Result Date: 04/04/2024 CLINICAL  DATA:  Pancreatic ductal dilatation and possible pancreatic mass. Indeterminate right renal lesion. EXAM: MRI ABDOMEN WITHOUT AND WITH CONTRAST TECHNIQUE: Multiplanar multisequence MR imaging of the abdomen was performed both before and after the administration of intravenous contrast. CONTRAST:  5mL GADAVIST  GADOBUTROL  1 MMOL/ML IV SOLN COMPARISON:  CT on 04/03/2024 FINDINGS: Lower chest: Left lower lobe airspace disease, suspicious for pneumonia. Hepatobiliary: Image degradation by motion artifact noted. No hepatic masses identified. Gallbladder is unremarkable. No evidence of biliary ductal dilatation. Pancreas: Image degradation by motion artifact noted. Moderate dilatation of the main pancreatic duct is seen in the body and tail. Stricture of the pancreatic duct is seen within the body, with subtle soft tissue prominence measuring 1.9 x 1.1 cm, best visualized on image 43/19. This is suspicious for a small pancreatic mass. Spleen:  Within normal limits in size and appearance. Adrenals/Urinary Tract: Image degradation by motion artifact noted, particularly the dynamic postcontrast sequences. Tiny benign-appearing Bosniak category 1 and 2 renal cysts are seen bilaterally. A complex subcapsular lesion is seen in the lateral lower pole of the right kidney which shows heterogeneous T1 hyperintensity and T2 hypointensity, and measures 2.6 x 2.5 cm. Although limited, no definite contrast enhancement of this lesion is seen on subtraction imaging. This is likely a complex hemorrhagic cystic lesion, and considered a Bosniak category 2 F lesion. No evidence of hydronephrosis. Stomach/Bowel: Unremarkable. Vascular/Lymphatic: No pathologically enlarged lymph nodes identified. No acute vascular findings. Other:  None. Musculoskeletal:  No suspicious bone lesions identified. IMPRESSION: Motion degraded exam. Moderate dilatation of the main pancreatic duct, with stricture in the pancreatic body and subtle soft tissue  prominence suspicious for a  small pancreatic mass. Recommend GI consultation for consideration of EUS/FNA. 2.6 cm complex subcapsular lesion in the right kidney is considered an indeterminate Bosniak category 2 F lesion. Recommend continued follow-up by renal protocol abdomen CT without and with contrast in 6 months. Left lower lobe airspace disease, suspicious for pneumonia. Electronically Signed   By: Norleen DELENA Kil M.D.   On: 04/04/2024 08:46   MR 3D Recon At Scanner Result Date: 04/04/2024 CLINICAL DATA:  Pancreatic ductal dilatation and possible pancreatic mass. Indeterminate right renal lesion. EXAM: MRI ABDOMEN WITHOUT AND WITH CONTRAST TECHNIQUE: Multiplanar multisequence MR imaging of the abdomen was performed both before and after the administration of intravenous contrast. CONTRAST:  5mL GADAVIST  GADOBUTROL  1 MMOL/ML IV SOLN COMPARISON:  CT on 04/03/2024 FINDINGS: Lower chest: Left lower lobe airspace disease, suspicious for pneumonia. Hepatobiliary: Image degradation by motion artifact noted. No hepatic masses identified. Gallbladder is unremarkable. No evidence of biliary ductal dilatation. Pancreas: Image degradation by motion artifact noted. Moderate dilatation of the main pancreatic duct is seen in the body and tail. Stricture of the pancreatic duct is seen within the body, with subtle soft tissue prominence measuring 1.9 x 1.1 cm, best visualized on image 43/19. This is suspicious for a small pancreatic mass. Spleen:  Within normal limits in size and appearance. Adrenals/Urinary Tract: Image degradation by motion artifact noted, particularly the dynamic postcontrast sequences. Tiny benign-appearing Bosniak category 1 and 2 renal cysts are seen bilaterally. A complex subcapsular lesion is seen in the lateral lower pole of the right kidney which shows heterogeneous T1 hyperintensity and T2 hypointensity, and measures 2.6 x 2.5 cm. Although limited, no definite contrast enhancement of this lesion is  seen on subtraction imaging. This is likely a complex hemorrhagic cystic lesion, and considered a Bosniak category 2 F lesion. No evidence of hydronephrosis. Stomach/Bowel: Unremarkable. Vascular/Lymphatic: No pathologically enlarged lymph nodes identified. No acute vascular findings. Other:  None. Musculoskeletal:  No suspicious bone lesions identified. IMPRESSION: Motion degraded exam. Moderate dilatation of the main pancreatic duct, with stricture in the pancreatic body and subtle soft tissue prominence suspicious for a small pancreatic mass. Recommend GI consultation for consideration of EUS/FNA. 2.6 cm complex subcapsular lesion in the right kidney is considered an indeterminate Bosniak category 2 F lesion. Recommend continued follow-up by renal protocol abdomen CT without and with contrast in 6 months. Left lower lobe airspace disease, suspicious for pneumonia. Electronically Signed   By: Norleen DELENA Kil M.D.   On: 04/04/2024 08:46   CT HEAD WO CONTRAST ( ) Result Date: 04/03/2024 EXAM: CT HEAD AND CERVICAL SPINE 04/03/2024 04:32:45 AM TECHNIQUE: CT of the head and cervical spine was performed without the administration of intravenous contrast. Multiplanar reformatted images are provided for review. Automated exposure control, iterative reconstruction, and/or weight based adjustment of the mA/kV was utilized to reduce the radiation dose to as low as reasonably achievable. COMPARISON: Head CT 09/09/2020. No prior cervical spine imaging. CLINICAL HISTORY: Head trauma, abnormal mental status (Age 24-64y) FINDINGS: CT HEAD BRAIN AND VENTRICLES: No acute intracranial hemorrhage. No mass effect or midline shift. No abnormal extra-axial fluid collection. There is encephalomalacia again noted due to a chronic right MCA infarct, centered in the right gangliocapsular area with extension into the posterior right frontal and anterior right parietal lobes and insula. Ex vacuo prominence of the right lateral ventricle is  again noted as well. There is atrophy with atrophic ventriculomegaly and moderate to severe small vessel disease, which is advanced for age. There is a chronic lacunar  infarct in the left basal ganglia. Asymmetric atrophy in the left cerebellar hemisphere. No cortical-based acute infarct. No hydrocephalus. There are patchy calcifications in both siphons. There are no hyperdense vessels. ORBITS: No acute abnormality. SINUSES AND MASTOIDS: There is chronic fluid in the right mastoid tip. There is increased left maxillary sinus membrane thickening with trace intracavitary fluid. The other paranasal sinuses and left mastoid air cells are clear. SOFT TISSUES AND SKULL: No acute skull fracture. No acute soft tissue abnormality. CT CERVICAL SPINE LIMITATIONS: Imaging of the cervical spine is limited due to patient motion. BONES AND ALIGNMENT: There is a mild cervical kypholevoscoliosis, centered at C3-4. There is a 2 to 3 mm grade 1 retrolisthesis at C3-C4 and C5-C6, both believed to be related to degenerative disc disease. No traumatic malalignment is seen. No widening of the anterior atlantoaxial joint. No acute cervical fracture is seen through the motion artifacts. DEGENERATIVE CHANGES: There is disc collapse with a chronic appearance and mild endplate irregularities at C3-4 and C5-6, mild disc space loss at C4-C5 and moderate disc space loss at C6-C7. The disc spaces at C2-3, C7-T1 are normal in height. There is mild facet joint hypertrophy at the upper cervical levels, with mild left foraminal stenosis at C3-4 and C4-5. All other foramina are clear. There are anterior and posterior osteophytes from C3-4 through C6-7. There are posterior osteophytes encroaching on the ventral cord surface at C3-4, C4-5 and C5-6, with mild spinal stenosis at these levels. Other levels do not show significant encroachment on the thecal sac allowing for motion artifact. VASCULATURE: Both Proximal cervical ICAs are heavily calcified. SOFT  TISSUES: There are stones in both palatine tonsils. No laryngeal mass is seen. No prevertebral soft tissue swelling. IMPRESSION: 1. No acute intracranial CT findings or depressed skull fractures. 2. Encephalomalacia due to a chronic right MCA infarct with ex vacuo prominence of the right lateral ventricle. 3. Atrophy with atrophic ventriculomegaly and moderate to severe small vessel disease, advanced for age. 4. Chronic lacunar infarct in the left basal ganglia. 5. Asymmetric atrophy in the left cerebellar hemisphere. 6. Mild cervical kypholevoscoliosis centered at C3-4. 7. 2 to 3 mm grade 1 retrolisthesis at C3-C4 and C5-C6, likely related to degenerative disc disease. 8. Disc collapse with a chronic appearance and mild endplate irregularities at C3-4 and C5-6, mild disc space loss at C4-C5, and moderate disc space loss at C6-C7. Spondylosis. 9. Mild left foraminal stenosis at C3-4 and C4-5. All other foramina are clear. 10. Posterior osteophytes encroaching on the ventral cord surface at C3-4, C4-5, and C5-6, with mild spinal stenosis at these levels. Other levels do not show significant encroachment on the thecal sac, allowing for motion artifact. Electronically signed by: Francis Quam MD 04/03/2024 05:09 AM EST RP Workstation: [id number]   CT Cervical Spine Wo Contrast Result Date: 04/03/2024 EXAM: CT HEAD AND CERVICAL SPINE 04/03/2024 04:32:45 AM TECHNIQUE: CT of the head and cervical spine was performed without the administration of intravenous contrast. Multiplanar reformatted images are provided for review. Automated exposure control, iterative reconstruction, and/or weight based adjustment of the mA/kV was utilized to reduce the radiation dose to as low as reasonably achievable. COMPARISON: Head CT 09/09/2020. No prior cervical spine imaging. CLINICAL HISTORY: Head trauma, abnormal mental status (Age 67-64y) FINDINGS: CT HEAD BRAIN AND VENTRICLES: No acute intracranial hemorrhage. No mass effect or  midline shift. No abnormal extra-axial fluid collection. There is encephalomalacia again noted due to a chronic right MCA infarct, centered in the right gangliocapsular area with  extension into the posterior right frontal and anterior right parietal lobes and insula. Ex vacuo prominence of the right lateral ventricle is again noted as well. There is atrophy with atrophic ventriculomegaly and moderate to severe small vessel disease, which is advanced for age. There is a chronic lacunar infarct in the left basal ganglia. Asymmetric atrophy in the left cerebellar hemisphere. No cortical-based acute infarct. No hydrocephalus. There are patchy calcifications in both siphons. There are no hyperdense vessels. ORBITS: No acute abnormality. SINUSES AND MASTOIDS: There is chronic fluid in the right mastoid tip. There is increased left maxillary sinus membrane thickening with trace intracavitary fluid. The other paranasal sinuses and left mastoid air cells are clear. SOFT TISSUES AND SKULL: No acute skull fracture. No acute soft tissue abnormality. CT CERVICAL SPINE LIMITATIONS: Imaging of the cervical spine is limited due to patient motion. BONES AND ALIGNMENT: There is a mild cervical kypholevoscoliosis, centered at C3-4. There is a 2 to 3 mm grade 1 retrolisthesis at C3-C4 and C5-C6, both believed to be related to degenerative disc disease. No traumatic malalignment is seen. No widening of the anterior atlantoaxial joint. No acute cervical fracture is seen through the motion artifacts. DEGENERATIVE CHANGES: There is disc collapse with a chronic appearance and mild endplate irregularities at C3-4 and C5-6, mild disc space loss at C4-C5 and moderate disc space loss at C6-C7. The disc spaces at C2-3, C7-T1 are normal in height. There is mild facet joint hypertrophy at the upper cervical levels, with mild left foraminal stenosis at C3-4 and C4-5. All other foramina are clear. There are anterior and posterior osteophytes from  C3-4 through C6-7. There are posterior osteophytes encroaching on the ventral cord surface at C3-4, C4-5 and C5-6, with mild spinal stenosis at these levels. Other levels do not show significant encroachment on the thecal sac allowing for motion artifact. VASCULATURE: Both Proximal cervical ICAs are heavily calcified. SOFT TISSUES: There are stones in both palatine tonsils. No laryngeal mass is seen. No prevertebral soft tissue swelling. IMPRESSION: 1. No acute intracranial CT findings or depressed skull fractures. 2. Encephalomalacia due to a chronic right MCA infarct with ex vacuo prominence of the right lateral ventricle. 3. Atrophy with atrophic ventriculomegaly and moderate to severe small vessel disease, advanced for age. 4. Chronic lacunar infarct in the left basal ganglia. 5. Asymmetric atrophy in the left cerebellar hemisphere. 6. Mild cervical kypholevoscoliosis centered at C3-4. 7. 2 to 3 mm grade 1 retrolisthesis at C3-C4 and C5-C6, likely related to degenerative disc disease. 8. Disc collapse with a chronic appearance and mild endplate irregularities at C3-4 and C5-6, mild disc space loss at C4-C5, and moderate disc space loss at C6-C7. Spondylosis. 9. Mild left foraminal stenosis at C3-4 and C4-5. All other foramina are clear. 10. Posterior osteophytes encroaching on the ventral cord surface at C3-4, C4-5, and C5-6, with mild spinal stenosis at these levels. Other levels do not show significant encroachment on the thecal sac, allowing for motion artifact. Electronically signed by: Francis Quam MD 04/03/2024 05:09 AM EST RP Workstation: [id number]   CT CHEST ABDOMEN PELVIS WO CONTRAST Result Date: 04/03/2024 CLINICAL DATA:  Increasing weakness. EXAM: CT CHEST, ABDOMEN AND PELVIS WITHOUT CONTRAST TECHNIQUE: Multidetector CT imaging of the chest, abdomen and pelvis was performed following the standard protocol without IV contrast. RADIATION DOSE REDUCTION: This exam was performed according to the  departmental dose-optimization program which includes automated exposure control, adjustment of the mA and/or kV according to patient size and/or use of iterative reconstruction  technique. COMPARISON:  Chest CT 08/20/2020 FINDINGS: CT CHEST FINDINGS Cardiovascular: Heart size upper normal. No substantial pericardial effusion. Coronary artery calcification is evident. Mild atherosclerotic calcification is noted in the wall of the thoracic aorta. Ascending thoracic aorta measures up to 4.2 cm diameter. Mediastinum/Nodes: No mediastinal lymphadenopathy. No evidence for gross hilar lymphadenopathy although assessment is limited by the lack of intravenous contrast on the current study. The esophagus has normal imaging features. Axillary lymphadenopathy noted bilaterally. Left axillary index lymph node measures 17 mm short axis on 20/2. Lungs/Pleura: Centrilobular and paraseptal emphysema evident. 8 mm right middle lobe nodule on 134/4. 6 mm right lower lobe nodule on 132/4. Consolidative airspace disease in the left lower lobe. No substantial pleural effusion. Musculoskeletal: No worrisome lytic or sclerotic osseous abnormality. CT ABDOMEN PELVIS FINDINGS Hepatobiliary: No suspicious focal abnormality in the liver on this study without intravenous contrast. Gallbladder not well seen likely due to nondistention. No intra or extrahepatic biliary duct dilatation. Pancreas: There is marked diffuse dilatation of the main pancreatic duct measuring up to 7 mm diameter in the pancreatic tail region. Pancreatic duct becomes obscured near the junction of the body and tail (image 81/2) and again appears distended through the pancreatic head region. Imaging of the pancreas is limited by lack of intravenous contrast material. Spleen: No splenomegaly. No suspicious focal mass lesion. Adrenals/Urinary Tract: No adrenal nodule or mass. 2.5 x 2.6 cm heterogeneous exophytic lesion identified lower pole right kidney, potentially with some  mineralization. Left kidney unremarkable. No hydroureter. The urinary bladder appears normal for the degree of distention. Stomach/Bowel: Diffuse gastric wall thickening evident, likely accentuated by underdistention. Duodenum is normally positioned as is the ligament of Treitz. No small bowel wall thickening. No small bowel dilatation. No gross colonic mass. No colonic wall thickening. Moderate stool volume evident. Vascular/Lymphatic: There is advanced atherosclerotic calcification of the abdominal aorta without aneurysm. There is no gastrohepatic or hepatoduodenal ligament lymphadenopathy. 12 mm short axis left para-aortic lymph node evident. Other mildly enlarged retroperitoneal lymph nodes are identified. 13 mm short axis right common iliac node on 106/2. Mild lymphadenopathy is identified in the groin regions. Reproductive: Prostate gland is enlarged. Other: No substantial intraperitoneal free fluid. Musculoskeletal: Extremely large left groin hernia contains small bowel and colon. There is some congestion in the small bowel mesentery of the herniated segments with probable small volume fluid in the hernia sac. No features to suggest overt bowel obstruction due to the herniated bowel segments. No worrisome lytic or sclerotic osseous abnormality. IMPRESSION: 1. Consolidative airspace disease in the left lower lobe compatible pneumonia. Imaging follow-up recommended to ensure complete resolution. 2. Extremely large left groin hernia contains small bowel and colon. There is some congestion in the small bowel mesentery of the herniated segments with probable small volume fluid in the hernia sac. No features to suggest overt bowel obstruction due to the herniated bowel segments. Correlation for signs/symptoms of incarceration recommended. 3. Marked diffuse dilatation of the main pancreatic duct measuring up to 7 mm diameter in the pancreatic tail region. Pancreatic duct becomes obscured near the junction of the  body and tail without a discernible mass lesion on noncontrast imaging. Main pancreatic duct is also distended through the pancreatic head region. Imaging of the pancreas is limited by lack of intravenous contrast material. MRI of the abdomen with and without contrast recommended to further evaluate. 4. 2.5 x 2.6 cm heterogeneous exophytic lesion lower pole right kidney, potentially with some mineralization. Findings raise concern for renal cell carcinoma.  This could also be further assessed at the time of MRI. 5. Axillary, retroperitoneal, and groin lymphadenopathy. Lymphoproliferative disorder not excluded. 6. 8 mm right middle lobe and 6 mm right lower lobe pulmonary nodules. Non-contrast chest CT at 3-6 months is recommended. If the nodules are stable at time of repeat CT, then future CT at 18-24 months (from today's scan) is considered optional for low-risk patients, but is recommended for high-risk patients. This recommendation follows the consensus statement: Guidelines for Management of Incidental Pulmonary Nodules Detected on CT Images: From the Fleischner Society 2017; Radiology 2017; 284:228-243. 7. 4.2 cm ascending thoracic aortic aneurysm. Recommend annual imaging followup by CTA or MRA. This recommendation follows 2010 ACCF/AHA/AATS/ACR/ASA/SCA/SCAI/SIR/STS/SVM Guidelines for the Diagnosis and Management of Patients with Thoracic Aortic Disease. Circulation. 2010; 121: Z733-z630. Aortic aneurysm NOS (ICD10-I71.9) 8. Aortic Atherosclerosis (ICD10-I70.0) and Emphysema (ICD10-J43.9). Electronically Signed   By: Camellia Candle M.D.   On: 04/03/2024 05:02    Microbiology: Results for orders placed or performed during the hospital encounter of 04/03/24  Resp panel by RT-PCR (RSV, Flu A&B, Covid) Anterior Nasal Swab     Status: None   Collection Time: 04/03/24  3:59 AM   Specimen: Anterior Nasal Swab  Result Value Ref Range Status   SARS Coronavirus 2 by RT PCR NEGATIVE NEGATIVE Final    Comment:  (NOTE) SARS-CoV-2 target nucleic acids are NOT DETECTED.  The SARS-CoV-2 RNA is generally detectable in upper respiratory specimens during the acute phase of infection. The lowest concentration of SARS-CoV-2 viral copies this assay can detect is 138 copies/mL. A negative result does not preclude SARS-Cov-2 infection and should not be used as the sole basis for treatment or other patient management decisions. A negative result may occur with  improper specimen collection/handling, submission of specimen other than nasopharyngeal swab, presence of viral mutation(s) within the areas targeted by this assay, and inadequate number of viral copies(<138 copies/mL). A negative result must be combined with clinical observations, patient history, and epidemiological information. The expected result is Negative.  Fact Sheet for Patients:  bloggercourse.com  Fact Sheet for Healthcare Providers:  seriousbroker.it  This test is no t yet approved or cleared by the [place]  FDA and  has been authorized for detection and/or diagnosis of SARS-CoV-2 by FDA under an Emergency Use Authorization (EUA). This EUA will remain  in effect (meaning this test can be used) for the duration of the COVID-19 declaration under Section 564(b)(1) of the Act, 21 U.S.C.section 360bbb-3(b)(1), unless the authorization is terminated  or revoked sooner.       Influenza A by PCR NEGATIVE NEGATIVE Final   Influenza B by PCR NEGATIVE NEGATIVE Final    Comment: (NOTE) The Xpert Xpress SARS-CoV-2/FLU/RSV plus assay is intended as an aid in the diagnosis of influenza from Nasopharyngeal swab specimens and should not be used as a sole basis for treatment. Nasal washings and aspirates are unacceptable for Xpert Xpress SARS-CoV-2/FLU/RSV testing.  Fact Sheet for Patients: bloggercourse.com  Fact Sheet for Healthcare  Providers: seriousbroker.it  This test is not yet approved or cleared by the [place]  FDA and has been authorized for detection and/or diagnosis of SARS-CoV-2 by FDA under an Emergency Use Authorization (EUA). This EUA will remain in effect (meaning this test can be used) for the duration of the COVID-19 declaration under Section 564(b)(1) of the Act, 21 U.S.C. section 360bbb-3(b)(1), unless the authorization is terminated or revoked.     Resp Syncytial Virus by PCR NEGATIVE NEGATIVE Final  Comment: (NOTE) Fact Sheet for Patients: bloggercourse.com  Fact Sheet for Healthcare Providers: seriousbroker.it  This test is not yet approved or cleared by the [place]  FDA and has been authorized for detection and/or diagnosis of SARS-CoV-2 by FDA under an Emergency Use Authorization (EUA). This EUA will remain in effect (meaning this test can be used) for the duration of the COVID-19 declaration under Section 564(b)(1) of the Act, 21 U.S.C. section 360bbb-3(b)(1), unless the authorization is terminated or revoked.  Performed at [facility], [address] [place], [place] [address]     Labs: CBC: Recent Labs  Lab 04/10/24 [phone] 04/11/24 1328 04/12/24 0522 04/12/24 1328 04/13/24 0549 04/14/24 0449 04/15/24 0721 04/16/24 0507  WBC 10.9*  --  8.1  --   --   --   --   --   NEUTROABS 3.7  --   --   --   --   --   --   --   HGB 8.0*   < > 6.7* 9.4* 8.5* 7.7* 8.1* 7.9*  HCT 27.4*  --  22.7* 31.3*  --   --   --   --   MCV 93.8  --  94.2  --   --   --   --   --   PLT 228  --  184  --   --   --   --   --    < > = values in this interval not displayed.   Basic Metabolic Panel: Recent Labs  Lab 04/12/24 0522 04/13/24 0549 04/14/24 0449 04/15/24 0721 04/16/24 0507  NA 142 141 139 140 140  K 4.9 5.2* 5.2* 5.1 4.7  CL 105 104 104 104 103  CO2 34* 34* 34* 33* 33*  GLUCOSE 82 69* 73  77 72  BUN 30* 30* 28* 28* 27*  CREATININE 1.73* 1.78* 1.71* 1.78* 1.82*  CALCIUM  8.3* 8.4* 8.2* 8.5* 8.5*   Liver Function Tests: No results for input(s): AST, ALT, ALKPHOS, BILITOT, PROT, ALBUMIN in the last 168 hours. CBG: No results for input(s): GLUCAP in the last 168 hours.  Discharge time spent: greater than 30 minutes.  Signed: Charlie Patterson, MD [facility] 04/16/2024

## 2024-04-16 NOTE — Progress Notes (Signed)
 Occupational Therapy Treatment Patient Details Name: Darin Hopkins MRN: [id number] DOB: [birth year] Today's Date: 04/16/2024   History of present illness presented to ER secondary to progressive weakness, hypoxia (noted with O2 tank empty); admitted for management of COPD exacerbation.   OT comments  Pt seen for OT tx. Pt agreeable to EOB grooming tasks. Denied complaints. Pt required LUE support on EOB to maintain fair- static sitting balance with intermittent VC for repositioning to improve balance/stability. Pt required set up to brush teeth, OT held basin for pt to spit into and provided water cup. He required LUE on EOB to maintain balance throughout. MIN A for thoroughness with washing his face and to apply lotion. Pt returned to bed, VC for PLB to support recovery. RN notified of 2 abrasions behind his ears where the nasal canula rests. Pt continues to benefit from skilled OT services.       If plan is discharge home, recommend the following:  A little help with walking and/or transfers;A little help with bathing/dressing/bathroom;Supervision due to cognitive status;Assistance with cooking/housework;Assist for transportation;Help with stairs or ramp for entrance   Equipment Recommendations  None recommended by OT    Recommendations for Other Services      Precautions / Restrictions Precautions Precautions: Fall Recall of Precautions/Restrictions: Impaired Restrictions Weight Bearing Restrictions Per Provider Order: No       Mobility Bed Mobility Overal bed mobility: Modified Independent             General bed mobility comments: increased time/effort and cues to boost himself up    Transfers Overall transfer level: Needs assistance   Transfers: Bed to chair/wheelchair/BSC            Lateral/Scoot Transfers: Supervision       Balance Overall balance assessment: Needs assistance Sitting-balance support: Single extremity supported, Feet supported, Feet  unsupported Sitting balance-Leahy Scale: Fair Sitting balance - Comments: fair-, 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 He required LUE on EOB to maintain balance throughout. MIN A for thoroughness with washing his face and to apply lotion.                                    Extremity/Trunk Assessment              Occupational Psychologist Communication: Impaired Factors Affecting Communication: Hearing impaired   Cognition Arousal: Alert Behavior During Therapy: WFL for tasks assessed/performed Cognition: No family/caregiver present to determine baseline           Executive functioning impairment (select all impairments): Problem solving                   Following commands: Impaired Following commands impaired: Follows multi-step commands with increased time      Cueing   Cueing Techniques:  Verbal cues, Tactile cues  Exercises      Shoulder Instructions       General Comments RN notified of 2 spots behind his ears that appeared to be irritated 2/2 nasal canula    Pertinent Vitals/ Pain       Pain Assessment Pain Assessment: No/denies pain  Home Living                                          Prior Functioning/Environment              Frequency  Min 2X/week        Progress Toward Goals  OT Goals(current goals can now be found in the care plan section)  Progress towards OT goals: Progressing toward goals  Acute Rehab OT  Goals Patient Stated Goal: go home OT Goal Formulation: With patient Time For Goal Achievement: 04/17/24 Potential to Achieve Goals: Good  Plan      Co-evaluation                 AM-PAC OT 6 Clicks Daily Activity     Outcome Measure   Help from another person eating meals?: None Help from another person taking care of personal grooming?: A Little Help from another person toileting, which includes using toliet, bedpan, or urinal?: A Little Help from another person bathing (including washing, rinsing, drying)?: A Little Help from another person to put on and taking off regular upper body clothing?: None Help from another person to put on and taking off regular lower body clothing?: A Little 6 Click Score: 20    End of Session Equipment Utilized During Treatment: Oxygen  OT Visit Diagnosis: Unsteadiness on feet (R26.81);Muscle weakness (generalized) (M62.81);History of falling (Z91.81);Other abnormalities of gait and mobility (R26.89)   Activity Tolerance Patient tolerated treatment well   Patient Left in bed;with call bell/phone within reach;with bed alarm set;with nursing/sitter in room   Nurse Communication Other (comment) (ears)        Time: 9075-9064 OT Time Calculation (min): 11 min  Charges: OT General Charges $OT Visit: 1 Visit OT Treatments $Self Care/Home Management : 8-22 mins  Warren SAUNDERS., MPH, MS, OTR/L ascom [phone] 04/16/24, 9:49 AM

## 2024-04-16 NOTE — Plan of Care (Signed)

## 2024-04-16 NOTE — TOC Progression Note (Signed)
 Transition of Care [facility]) - Progression Note    Patient Details  Name: Darin Hopkins MRN: [id number] Date of Birth: 1961/03/31  [facility] CM/SW Contact  Alvaro Louder, [place] Phone Number: 04/16/2024, 4:20 PM  Clinical Narrative:   [facility] awaiting hospital bed DME to be delivered to the patients home. Once delivered, LCSWA will contact EMS for transport home.   TOC to follow for discharge.    Expected Discharge Plan: Home/Self Care Barriers to Discharge: No Barriers Identified               Expected Discharge Plan and Services         Expected Discharge Date: 04/16/24                                     Social Drivers of Health (SDOH) Interventions SDOH Screenings   Food Insecurity: No Food Insecurity (04/03/2024)  Housing: Low Risk  (04/03/2024)  Transportation Needs: No Transportation Needs (04/03/2024)  Utilities: Not At Risk (04/03/2024)  Depression (PHQ2-9): Low Risk  (02/11/2022)  Tobacco Use: Medium Risk (04/03/2024)    Readmission Risk Interventions     No data to display

## 2024-04-16 NOTE — TOC CM/SW Note (Signed)
 Transition of Care (TOC) CM/SW Note   This patient requires the head of the bed to be elevated more than 30 degrees most of the time due to Hyperkalemia, History of seizure and COPD exacerbation (HCC). Plus the need for frequent changes in the body position or the need for an immediate change of body position.

## 2024-04-17 ENCOUNTER — Telehealth (HOSPITAL_COMMUNITY): Payer: Self-pay | Admitting: Internal Medicine

## 2024-04-17 ENCOUNTER — Telehealth (HOSPITAL_COMMUNITY): Payer: Self-pay

## 2024-04-17 DIAGNOSIS — D509 Iron deficiency anemia, unspecified: Secondary | ICD-10-CM | POA: Insufficient documentation

## 2024-04-17 LAB — CREATININE, SERUM
Creatinine, Ser: 1.78 mg/dL — ABNORMAL HIGH (ref 0.61–1.24)
GFR, Estimated: 42 mL/min — ABNORMAL LOW (ref >60)

## 2024-04-17 NOTE — Telephone Encounter (Addendum)
 Auth Submission: NO AUTH NEEDED Site of care: Site of care: [facility] Payer: [employer] Medication & CPT/J Code(s) submitted: Feraheme (ferumoxytol) [id number] Diagnosis Code: D50.9 Route of submission (phone, fax, portal):  Phone # Fax # Auth type: Buy/Bill HB Units/visits requested: 510mg  x 1 dose Reference number:  Approval from: 04/17/24 to 07/16/24

## 2024-04-17 NOTE — Plan of Care (Signed)
  Problem: Education: Goal: Knowledge of General Education information will improve Description: Including pain rating scale, medication(s)/side effects and non-pharmacologic comfort measures Outcome: Progressing   Problem: Clinical Measurements: Goal: Ability to maintain clinical measurements within normal limits will improve Outcome: Progressing Goal: Respiratory complications will improve Outcome: Progressing Goal: Cardiovascular complication will be avoided Outcome: Progressing   Problem: Elimination: Goal: Will not experience complications related to bowel motility Outcome: Progressing   Problem: Education: Goal: Knowledge of disease or condition will improve Outcome: Progressing

## 2024-04-17 NOTE — Progress Notes (Signed)
 Progress Note   Patient: Darin Hopkins FMW:[id number] DOB: 06/30/1960 DOA: 04/03/2024     14 DOS: the patient was seen and examined on 04/17/2024   Brief hospital course: 63 y.o. male with a PMH significant for COPD, HTN, left inguinal hernia, GERD, CHF, history of seizure, HLD, alcohol dependence, protein calorie malnutrition, CKD 3.   They presented from home to the ED on 04/03/2024 with concerns for considerable weakness x 2 days.  Per EMS report, patient's O2 saturations on their initial evaluation were in the 50s which appears to be due to the fact that his oxygen tank was empty and chronically is supposed to be on 2 to 3 L.  They also reported significant bedbugs in his home.  He was put on home oxygen with significant improvement.   In the ED, it was found that they had stable vital signs on 2 L nasal cannula.  It appears that patient was put on BiPAP at some point earlier this morning but there is no documented desaturation or reason for this and when I evaluated patient he was stable and comfortable so transitioned back to his home nasal cannula oxygen.  Significant findings included: Essentially pan CT scanned in the ED with several findings:   -Possible left lower lobe pneumonia on chest CT.   -Large left groin hernia without obstruction or incarceration. -Abnormal findings in the pancreas on CT scan were concerning for mass but not fully evaluated so radiology recommended abdominal MRI to further evaluate. -Also has mass on lower pole of right kidney -Additionally, multiple nodules were seen in the right lower lobe of his lungs which is recommended to have noncontrast chest CT follow-up in 3 to 6 months.   They were initially treated with Mucinex , DuoNeb, Solu-Medrol , ceftriaxone , doxycycline .    Patient was admitted to medicine service for further workup and management of hypoxia as outlined in detail below  MRI of the abdomen showed motion degraded exam, moderate dilatation of  main pancreatic duct with and subtle soft tissue prominence suspicious for small pancreatic mass recommending EUS for further consideration, 2.6 cm complex subcapsular lesion of the right kidney considered intermediate to Bosniak category 66F lesion recommend continue follow-up by renal protocol abdomen CT with and 6 months.  12/3.  Received insurance authorization for rehab.  I ordered labs for today since potassium was high yesterday.  Hemoglobin drifted down to 7.3, potassium 5.  Creatinine 1.72.  Will check hemoglobin again tomorrow with type and cross.  IV fluids stopped. 12/4.  Hemoglobin drifted down to 6.7.  1 unit of packed red blood cells given and hemoglobin up to 9.4.  Patient feels well after transfusion.  No shortness of breath.  Family refused going out to rehab. 12/5.  Unable to reach family today.  Will treat high potassium today. 12/6.  Hemoglobin down to 7.7.  Given IV iron .  Potassium 5.2. 12/7.  Tried to contact family about discharge.  The patient has no money because his wallet is missing.  I advised that we could have gotten medications to the [facility] pharmacy.  Patient would like his medications to go to the [place] clinic but family concerned that since he does not have an ID he will be able to get them. 12/8.  Patient feeling well.  Hemoglobin 7.9.  Give another dose of IV iron .  Creatinine 1.82 and potassium 4.7.  Lokelma  prescription went into [facility] pharmacy which was delivered.  Other prescription sent to [facility] pharmacy.  Patient able to  pick up prescriptions at the [facility] pharmacy without an ID.  Patient discharged. 12/9.  Apparently hospital bed not delivered yesterday.  Spoke with TOC and sister did not schedule a delivery time for today.  Assessment and Plan: * Hyperkalemia Patient received Lokelma  during the hospital course.  Last potassium 4.7.  Low potassium diet.  Will need Lokelma  daily upon going home and this was prescribed through the [facility] pharmacy.  Drop  in hemoglobin Hemoglobin 6.7 the other day and given a unit of blood.  Status post IV iron  x 2 doses.  On Protonix  here and omeprazole  as outpatient.  Stop aspirin .  No signs of bleeding.  Did receive fluids during the hospital course.  Last hemoglobin 7.9.  Continue to watch hemoglobin as outpatient.  Refer for iron  infusions.  COPD exacerbation (HCC) Treated during hospital course  Acute kidney injury superimposed on CKD Acute kidney injury on CKD stage IIIb.  Today's creatinine 1.78 with a GFR 42.  Creatinine peaked at 2.77.  Refer to nephrology as outpatient.  Hypernatremia Sodium normalized with IV fluids.   Pancreatic mass CA 19-9 only a few points higher than the normal range.  Recommend outpatient follow-up with Dr. Wilhelmenia for endoscopic ultrasound.  Kidney lesion, native, right MRI showing 2.6 cm complex subcapsular lesion of the right kidney considered indeterminate recommend continued follow-up with renal protocol CT in 6 months.  With chronic kidney disease may have to repeat with an MRI.  Left inguinal hernia Seen by general surgery and not a surgical candidate currently.  Weakness We had rehab set up the other day but family refused and now they will take home with outpatient physical therapy.  Chronic diastolic CHF (congestive heart failure) (HCC) No signs of congestive heart failure on this hospital course even with giving IV fluids and blood.  Can likely go back on Farxiga  as outpatient.  No ACE or ARB or spironolactone with acute kidney injury and hyperkalemia.  On low-dose bisoprolol .  Chronic respiratory failure with hypoxia and hypercapnia (HCC) On 3 L of oxygen.  Patient set up with portable tanks.  Already has a concentrator.  History of seizure On Keppra         Subjective: Patient feels okay.  Offers no complaints.  Admitted 14 days ago with COPD exacerbation  Physical Exam: Vitals:   04/16/24 1553 04/16/24 2028 04/17/24 0411 04/17/24 0844  BP:  (!) 152/82 (!) 157/75 (!) 152/78 (!) 149/82  Pulse: 63 62 63 (!) 57  Resp: 17 20 18 18   Temp: 97.8 F (36.6 C) 98.4 F (36.9 C) 98.3 F (36.8 C) 99.3 F (37.4 C)  TempSrc:   Oral   SpO2: 99% 97% 98% 100%  Weight:      Height:       Physical Exam HENT:     Head: Normocephalic.  Eyes:     General: Lids are normal.  Cardiovascular:     Rate and Rhythm: Normal rate and regular rhythm.     Heart sounds: Normal heart sounds, S1 normal and S2 normal.  Pulmonary:     Breath sounds: Examination of the right-lower field reveals decreased breath sounds. Examination of the left-lower field reveals decreased breath sounds. Decreased breath sounds present. No wheezing, rhonchi or rales.  Abdominal:     Palpations: Abdomen is soft.     Tenderness: There is no abdominal tenderness.  Musculoskeletal:     Right lower leg: No swelling.     Left lower leg: No swelling.  Skin:  General: Skin is warm.     Findings: No rash.  Neurological:     Mental Status: He is alert.     Data Reviewed: Last creatinine 1.78 with a GFR of 42 last potassium 4.7 last hemoglobin 7.9  Family Communication: TOC to contact family about considering rehab again versus getting the bed delivered and getting patient home with outpatient therapy.  Disposition: Status is: Inpatient Patient discharged yesterday but hospital bed not delivered.  TOC to speak with family about rehab and/or getting the bed delivered today and getting home  Planned Discharge Destination: Home    Time spent: 25 minutes Case discussed with [facility] and nursing staff  Author: Charlie Patterson, MD 04/17/2024 1:41 PM  For on call review [email or web address].

## 2024-04-17 NOTE — Telephone Encounter (Signed)
 Patient referred to infusion pharmacy team for ambulatory infusion of IV iron .  Insurance - [place] Medicaid Prepaid Plan  Site of care - Site of care: [facility] Dx code - D50.9  IV Iron  Therapy - Feraheme 510 mg IV x 1. Patient received Venofer  300 mg IV x 1  Infusion appointments - Scheduling team will schedule patient as soon as possible.    Bexton Haak D. Hue Steveson, PharmD

## 2024-04-17 NOTE — TOC Progression Note (Signed)
 Transition of Care [facility]) - Progression Note    Patient Details  Name: Darin Hopkins MRN: [id number] Date of Birth: Nov 16, 1960  Transition of Care [facility]) CM/SW Contact  Alvaro Louder, [place] Phone Number: 04/17/2024, 4:23 PM  Clinical Narrative:   LCSWA spoke with Darin Shuck on the phone. She indicated that she wants her home treated for bed bugs before the hospital bed is delivered. Shuck indicated that she was informed that TOC will help her find a program that helps pay for bed bug treatment.   LCSWA educated Shuck that [facility] does not provide compensation or have any programs that help pay for Treatment. Shuck indicated that she is going to call her daughter Florencia to talk about steps for bed bug treatment. LCSWA stated that the hospital bed is the last step to getting the patient home since he is medically ready and we cannot keep him at the hospital.   TOC to follow for discharge.       Expected Discharge Plan: Home/Self Care Barriers to Discharge: No Barriers Identified               Expected Discharge Plan and Services         Expected Discharge Date: 04/16/24                                     Social Drivers of Health (SDOH) Interventions SDOH Screenings   Food Insecurity: No Food Insecurity (04/03/2024)  Housing: Low Risk  (04/03/2024)  Transportation Needs: No Transportation Needs (04/03/2024)  Utilities: Not At Risk (04/03/2024)  Depression (PHQ2-9): Low Risk  (02/11/2022)  Tobacco Use: Medium Risk (04/03/2024)    Readmission Risk Interventions     No data to display

## 2024-04-17 NOTE — Progress Notes (Signed)
 PT Cancellation Note  Patient Details Name: Darin Hopkins MRN: [id number] DOB: 1960-06-21   Cancelled Treatment:     PT session attempted x 2, pt eating and then visiting with APS. Will re-attempt next available date/time per POC.   [name] 04/17/2024, 3:51 PM

## 2024-04-18 LAB — BASIC METABOLIC PANEL WITH GFR
Anion gap: 3 — ABNORMAL LOW (ref 5–15)
BUN: 30 mg/dL — ABNORMAL HIGH (ref 8–23)
CO2: 34 mmol/L — ABNORMAL HIGH (ref 22–32)
Calcium: 8.6 mg/dL — ABNORMAL LOW (ref 8.9–10.3)
Chloride: 106 mmol/L (ref 98–111)
Creatinine, Ser: 1.92 mg/dL — ABNORMAL HIGH (ref 0.61–1.24)
GFR, Estimated: 39 mL/min — ABNORMAL LOW (ref >60)
Glucose, Bld: 80 mg/dL (ref 70–99)
Potassium: 4.6 mmol/L (ref 3.5–5.1)
Sodium: 142 mmol/L (ref 135–145)

## 2024-04-18 MED ORDER — CLONIDINE HCL 0.1 MG PO TABS
0.1000 mg | ORAL_TABLET | Freq: Once | ORAL | Status: AC
  Administered 2024-04-18: 0.1 mg via ORAL
  Filled 2024-04-18: qty 1

## 2024-04-18 NOTE — Progress Notes (Signed)
 Patient was given gift cards by charge nurse prior to leaving with EMS. Patient requested gift cards be placed in his patient belonging bags, exchange witnessed Jesse Quibol.

## 2024-04-18 NOTE — Progress Notes (Signed)
 Mobility Specialist - Progress Note   04/18/24 1442  Mobility  Activity Ambulated with assistance  Level of Assistance Contact guard assist, steadying assist  Assistive Device Front wheel walker  Distance Ambulated (ft) 20 ft  Activity Response Tolerated well  Mobility visit 1 Mobility  Mobility Specialist Start Time (ACUTE ONLY) 1423  Mobility Specialist Stop Time (ACUTE ONLY) 1436  Mobility Specialist Time Calculation (min) (ACUTE ONLY) 13 min   Pt supine upon entry, utilizing [place]. Pt completed bed mob ModI, required CGA to stand and amb to/from the window--- dyspnea noted upon return to bed, O2 >90%. Pt left semi fowler with alarm set and needs within reach.  America Silvan Mobility Specialist 04/18/24 2:46 PM

## 2024-04-18 NOTE — Consult Note (Addendum)
 WOC Nurse Consult Note: Consult requested for bilat buttocks and sacrum wounds. Pt is very thin with protruding sacrum bone. Stage 2 pressure injury to this location, .2X.2X.1cm. Red moist macerated skin surrounding bilat buttocks and sacrum with patchy areas of partial thickness skin loss; appearance is consistent with moisture associated skin damage.   Pressure Injury POA: No Dressing procedure/placement/frequency: Topical treatment orders provided for bedside nurses to perform as follows to protect from further injury: Foam dressing to bilat buttocks, change Q 3 days or PRN soiling.  Please re-consult if further assistance is needed.  Thank-you,  Stephane Fought MSN, RN, CWOCN, CWCN-AP, CNS Contact Mon-Fri 0700-1500: [phone]

## 2024-04-18 NOTE — Discharge Summary (Signed)
 Physician Discharge Summary   Patient: Darin Hopkins MRN: [id number]  DOB: [birth year]   Admit:     Date of Admission: 04/03/2024 Admitted from: home   Discharge: Date of discharge: 04/18/24 Disposition: Home health Condition at discharge: good  CODE STATUS: FULL CODE     Discharge Physician: Laneta Blunt, DO [facility]     PCP: [name], NP  Recommendations for Outpatient Follow-up:  Follow up with PCP [name], NP in 1-2 weeks Please obtain labs/tests: CBC, BMP in 1 week Ensure follow up for IV iron  infusions, GI workup  PCP AND OTHER OUTPATIENT PROVIDERS: SEE BELOW FOR SPECIFIC DISCHARGE INSTRUCTIONS PRINTED FOR PATIENT IN ADDITION TO GENERIC AVS PATIENT INFO    Discharge Instructions     Amb Referral to Intravenous Iron  Therapy   Complete by: As directed    You have been referred to [facility] Infusion team for IV Iron  Infusions. The infusion pharmacy team will reach out to you with appointment information.    Primary Diagnosis Code for IV Iron : D50.9 - Iron  deficiency Anemia   Secondary diagnosis code for IV iron : N18.x - CKD   Ambulatory referral to Gastroenterology   Complete by: As directed    Small pancreatic mass noted on imaging.  Ambulatory referral placed for outpatient GI evaluation and consideration for endoscopic ultrasound   What is the reason for referral?: Other Comment - Pancreatic mass   Ambulatory referral to Physical Therapy   Complete by: As directed          Discharge Diagnoses: Principal Problem:   Hyperkalemia Active Problems:   Drop in hemoglobin   COPD exacerbation (HCC)   Acute kidney injury superimposed on CKD   Hypernatremia   Pancreatic mass   Kidney lesion, native, right   Left inguinal hernia   History of seizure   Chronic respiratory failure with hypoxia and hypercapnia (HCC)   Chronic diastolic CHF (congestive heart failure) (HCC)   Weakness       Hospital Course:  63 y.o.  male with a PMH significant for COPD, HTN, left inguinal hernia, GERD, CHF, history of seizure, HLD, alcohol dependence, protein calorie malnutrition, CKD 3.   They presented from home to the ED on 04/03/2024 with concerns for considerable weakness x 2 days.  Per EMS report, patient's O2 saturations on their initial evaluation were in the 50s which appears to be due to the fact that his oxygen tank was empty and chronically is supposed to be on 2 to 3 L.  They also reported significant bedbugs in his home.  He was put on home oxygen with significant improvement.   In the ED, it was found that they had stable vital signs on 2 L nasal cannula.  It appears that patient was put on BiPAP at some point earlier this morning but there is no documented desaturation or reason for this and when I evaluated patient he was stable and comfortable so transitioned back to his home nasal cannula oxygen.  Significant findings included: Essentially pan CT scanned in the ED with several findings:   -Possible left lower lobe pneumonia on chest CT.   -Large left groin hernia without obstruction or incarceration. -Abnormal findings in the pancreas on CT scan were concerning for mass but not fully evaluated so radiology recommended abdominal MRI to further evaluate. -Also has mass on lower pole of right kidney -Additionally, multiple nodules were seen in the right lower lobe of his lungs which is recommended to  have noncontrast chest CT follow-up in 3 to 6 months.   They were initially treated with Mucinex , DuoNeb, Solu-Medrol , ceftriaxone , doxycycline .    Patient was admitted to medicine service for further workup and management of hypoxia as outlined in detail below  MRI of the abdomen showed motion degraded exam, moderate dilatation of main pancreatic duct with and subtle soft tissue prominence suspicious for small pancreatic mass recommending EUS for further consideration, 2.6 cm complex subcapsular lesion of the right  kidney considered intermediate to Bosniak category 20F lesion recommend continue follow-up by renal protocol abdomen CT with and 6 months.  12/3.  Received insurance authorization for rehab.  I ordered labs for today since potassium was high yesterday.  Hemoglobin drifted down to 7.3, potassium 5.  Creatinine 1.72.  Will check hemoglobin again tomorrow with type and cross.  IV fluids stopped. 12/4.  Hemoglobin drifted down to 6.7.  1 unit of packed red blood cells given and hemoglobin up to 9.4.  Patient feels well after transfusion.  No shortness of breath.  Family refused going out to rehab. 12/5.  Unable to reach family today.  Will treat high potassium today. 12/6.  Hemoglobin down to 7.7.  Given IV iron .  Potassium 5.2. 12/7.  Tried to contact family about discharge.  The patient has no money because his wallet is missing.  I advised that we could have gotten medications to the [facility] pharmacy.  Patient would like his medications to go to the [place] clinic but family concerned that since he does not have an ID he will be able to get them. 12/8.  Patient feeling well.  Hemoglobin 7.9.  Give another dose of IV iron .  Creatinine 1.82 and potassium 4.7.  Lokelma  prescription went into [facility] pharmacy which was delivered.  Other prescription sent to [facility] clinic pharmacy.  Patient able to pick up prescriptions at the [facility] clinic pharmacy without an ID.  Patient discharged. 12/9.  Apparently hospital bed not delivered yesterday.  Spoke with TOC and sister did not schedule a delivery time for today. 12/10: hospital bed to be delivered today       Assessment / Plan:   Hyperkalemia Patient received Lokelma  during the hospital course.  Last potassium 4.6.  Low potassium diet.  Will need Lokelma  daily upon going home and this was prescribed through the [facility] pharmacy.   Drop in hemoglobin Hemoglobin 6.7 the other day and given a unit of blood.  Status post IV iron  x 2 doses.  On Protonix  here and omeprazole   as outpatient.  Stop aspirin .  No signs of bleeding.  Did receive fluids during the hospital course.  Last hemoglobin 7.9.  Continue to watch hemoglobin as outpatient.  Refer for iron  infusions.   COPD exacerbation  Treated during hospital course   Acute kidney injury superimposed on CKD Acute kidney injury on CKD stage IIIb.  Today's creatinine 1.78 with a GFR 42.  Creatinine peaked at 2.77.  Refer to nephrology as outpatient. [place] follow BMP.    Hypernatremia Sodium normalized with IV fluids. Encourage po hydration    Pancreatic mass CA 19-9 only a few points higher than the normal range.  Recommend outpatient follow-up with Dr. Wilhelmenia for endoscopic ultrasound.   Kidney lesion, native, right MRI showing 2.6 cm complex subcapsular lesion of the right kidney considered indeterminate recommend continued follow-up with renal protocol CT in 6 months.  With chronic kidney disease may have to repeat with an MRI.   Left inguinal hernia Seen by  general surgery and not a surgical candidate currently.   Weakness We had rehab set up the other day but family refused and now they will take home with outpatient physical therapy.   Chronic diastolic CHF (congestive heart failure) No signs of congestive heart failure on this hospital course even with giving IV fluids and blood.  Can go back on Farxiga  as outpatient.  No ACE or ARB or spironolactone with acute kidney injury and hyperkalemia.  On low-dose bisoprolol .   Chronic respiratory failure with hypoxia and hypercapnia (HCC) On 3 L of oxygen.  Patient set up with portable tanks.  Already has a concentrator.   History of seizure On Keppra          Discharge Instructions  Allergies as of 04/18/2024   No Known Allergies      Medication List     STOP taking these medications    amLODipine  10 MG tablet Commonly known as: NORVASC    aspirin  EC 81 MG tablet   furosemide  20 MG tablet Commonly known as: LASIX    hydrALAZINE  50  MG tablet Commonly known as: APRESOLINE    potassium chloride  10 MEQ tablet Commonly known as: KLOR-CON  M       TAKE these medications    acetaminophen  325 MG tablet Commonly known as: TYLENOL  Take 2 tablets (650 mg total) by mouth every 6 (six) hours as needed for mild pain (pain score 1-3) or fever (or Fever >/= 101).   atorvastatin  20 MG tablet Commonly known as: LIPITOR Take 1 tablet (20 mg total) by mouth every evening.   Bisoprolol  Fumarate 2.5 MG Tabs Take 2.5 mg by mouth at bedtime. What changed:  medication strength how much to take how to take this when to take this additional instructions   cholecalciferol 25 MCG (1000 UNIT) tablet Commonly known as: VITAMIN D3 Take 1 tablet (1,000 Units total) by mouth daily.   Farxiga  10 MG Tabs tablet Generic drug: dapagliflozin  propanediol Take 1 tablet (10 mg total) by mouth daily before breakfast.   FeroSul 325 (65 Fe) MG tablet Generic drug: ferrous sulfate Take 1 tablet (325 mg total) by mouth daily with breakfast.   folic acid  1 MG tablet Commonly known as: FOLVITE  Take 1 tablet (1 mg total) by mouth daily.   levETIRAcetam  750 MG tablet Commonly known as: KEPPRA  Take 1 tablet (750 mg total) by mouth 2 (two) times daily.   Lokelma  10 g Pack packet Generic drug: sodium zirconium cyclosilicate  Take 10 g by mouth daily.   Multivitamin Adult Tabs Take 2 tablets by mouth daily.   omeprazole  20 MG capsule Commonly known as: PRILOSEC Take 1 capsule (20 mg total) by mouth daily before breakfast.   ProAir  HFA 108 (90 Base) MCG/ACT inhaler Generic drug: albuterol  Inhale 2 puffs into the lungs every 6 (six) hours as needed for shortness of breath or wheezing.   Stiolto Respimat  2.5-2.5 MCG/ACT Aers Generic drug: Tiotropium Bromide -Olodaterol Inhale 2 puffs into the lungs daily. Pt needs to make an appointment to continue to receive refills please.   thiamine  100 MG tablet Commonly known as: VITAMIN B1 Take 1  tablet (100 mg total) by mouth daily.   traZODone  50 MG tablet Commonly known as: DESYREL  Take 1 tablet (50 mg total) by mouth at bedtime.               Durable Medical Equipment  (From admission, onward)           Start     Ordered  04/14/24 1328  For home use only DME oxygen  Once       Question Answer Comment  Length of Need Lifetime   Mode or (Route) Nasal cannula   Liters per Minute 3   Frequency Continuous (stationary and portable oxygen unit needed)   Oxygen conserving device Yes   Oxygen delivery system: Gas      04/14/24 1328   04/14/24 1327  For home use only DME Hospital bed  Once       Question Answer Comment  Length of Need Lifetime   The above medical condition requires: Patient requires the ability to reposition frequently   Head must be elevated greater than: 30 degrees   Bed type Semi-electric   Support Surface: Gel Overlay      04/14/24 1326             Contact information for follow-up providers     Mansouraty, Aloha Raddle., MD Follow up in 3 week(s).   Specialties: Gastroenterology, Internal Medicine Why: will need endoscopic ultrasound of pancreaus Contact information: [address] [place] [address] [phone]         [name], NP Follow up in 5 day(s).   Specialty: Nurse Practitioner Contact information: [address] [place] [address] [phone]         Douglas Rule, MD Follow up in 2 week(s).   Specialty: Nephrology Why: CKD after hospital referral Contact information: [address] [place] [address] [phone]              Contact information for after-discharge care     Destination     [facility] .   Service: Skilled Nursing Contact information: [address] [place]  [address] [phone]                     No Known Allergies   Subjective: pt has no concerns or complaints today. Tolerating  diet, no N/V, no CP/SOB   Discharge Exam: BP (!) 155/89 (BP Location: Right Arm)   Pulse (!) 59   Temp 97.6 F (36.4 C)   Resp 16   Ht 5' 7 (1.702 m)   Wt 53.4 kg   SpO2 99%   BMI 18.44 kg/m  General: Pt is alert, awake, not in acute distress Cardiovascular: RRR, S1/S2 +, no rubs, no gallops Respiratory: CTA bilaterally, no wheezing, no rhonchi Abdominal: Soft, NT, ND, bowel sounds + Extremities: no edema, no cyanosis     The results of significant diagnostics from this hospitalization (including imaging, microbiology, ancillary and laboratory) are listed below for reference.     Microbiology: No results found for this or any previous visit (from the past 240 hours).   Labs: BNP (last 3 results) Recent Labs    02/01/24 1035  BNP 196.8*   Basic Metabolic Panel: Recent Labs  Lab 04/13/24 0549 04/14/24 0449 04/15/24 0721 04/16/24 0507 04/17/24 0501 04/18/24 0759  NA 141 139 140 140  --  142  K 5.2* 5.2* 5.1 4.7  --  4.6  CL 104 104 104 103  --  106  CO2 34* 34* 33* 33*  --  34*  GLUCOSE 69* 73 77 72  --  80  BUN 30* 28* 28* 27*  --  30*  CREATININE 1.78* 1.71* 1.78* 1.82* 1.78* 1.92*  CALCIUM  8.4* 8.2* 8.5* 8.5*  --  8.6*   Liver Function Tests: No results for input(s): AST,  ALT, ALKPHOS, BILITOT, PROT, ALBUMIN in the last 168 hours. No results for input(s): LIPASE, AMYLASE in the last 168 hours. No results for input(s): AMMONIA in the last 168 hours. CBC: Recent Labs  Lab 04/12/24 0522 04/12/24 1328 04/13/24 0549 04/14/24 0449 04/15/24 0721 04/16/24 0507  WBC 8.1  --   --   --   --   --   HGB 6.7* 9.4* 8.5* 7.7* 8.1* 7.9*  HCT 22.7* 31.3*  --   --   --   --   MCV 94.2  --   --   --   --   --   PLT 184  --   --   --   --   --    Cardiac Enzymes: No results for input(s): CKTOTAL, CKMB, CKMBINDEX, TROPONINI in the last 168 hours. BNP: Invalid input(s): POCBNP CBG: No results for input(s): GLUCAP in the last 168  hours. D-Dimer No results for input(s): DDIMER in the last 72 hours. Hgb A1c No results for input(s): HGBA1C in the last 72 hours. Lipid Profile No results for input(s): CHOL, HDL, LDLCALC, TRIG, CHOLHDL, LDLDIRECT in the last 72 hours. Thyroid  function studies No results for input(s): TSH, T4TOTAL, T3FREE, THYROIDAB in the last 72 hours.  Invalid input(s): FREET3 Anemia work up No results for input(s): VITAMINB12, FOLATE, FERRITIN, TIBC, IRON , RETICCTPCT in the last 72 hours. Urinalysis    Component Value Date/Time   COLORURINE YELLOW (A) 04/03/2024 0814   APPEARANCEUR HAZY (A) 04/03/2024 0814   APPEARANCEUR Clear 11/02/2013 0205   LABSPEC 1.013 04/03/2024 0814   LABSPEC 1.005 11/02/2013 0205   PHURINE 5.0 04/03/2024 0814   GLUCOSEU 50 (A) 04/03/2024 0814   GLUCOSEU Negative 11/02/2013 0205   HGBUR NEGATIVE 04/03/2024 0814   BILIRUBINUR NEGATIVE 04/03/2024 0814   BILIRUBINUR Negative 11/02/2013 0205   KETONESUR NEGATIVE 04/03/2024 0814   PROTEINUR 100 (A) 04/03/2024 0814   NITRITE NEGATIVE 04/03/2024 0814   LEUKOCYTESUR NEGATIVE 04/03/2024 0814   LEUKOCYTESUR Negative 11/02/2013 0205   Sepsis Labs Recent Labs  Lab 04/12/24 0522  WBC 8.1   Microbiology No results found for this or any previous visit (from the past 240 hours). Imaging MR ABDOMEN W WO CONTRAST Result Date: 04/04/2024 CLINICAL DATA:  Pancreatic ductal dilatation and possible pancreatic mass. Indeterminate right renal lesion. EXAM: MRI ABDOMEN WITHOUT AND WITH CONTRAST TECHNIQUE: Multiplanar multisequence MR imaging of the abdomen was performed both before and after the administration of intravenous contrast. CONTRAST:  5mL GADAVIST  GADOBUTROL  1 MMOL/ML IV SOLN COMPARISON:  CT on 04/03/2024 FINDINGS: Lower chest: Left lower lobe airspace disease, suspicious for pneumonia. Hepatobiliary: Image degradation by motion artifact noted. No hepatic masses identified. Gallbladder  is unremarkable. No evidence of biliary ductal dilatation. Pancreas: Image degradation by motion artifact noted. Moderate dilatation of the main pancreatic duct is seen in the body and tail. Stricture of the pancreatic duct is seen within the body, with subtle soft tissue prominence measuring 1.9 x 1.1 cm, best visualized on image 43/19. This is suspicious for a small pancreatic mass. Spleen:  Within normal limits in size and appearance. Adrenals/Urinary Tract: Image degradation by motion artifact noted, particularly the dynamic postcontrast sequences. Tiny benign-appearing Bosniak category 1 and 2 renal cysts are seen bilaterally. A complex subcapsular lesion is seen in the lateral lower pole of the right kidney which shows heterogeneous T1 hyperintensity and T2 hypointensity, and measures 2.6 x 2.5 cm. Although limited, no definite contrast enhancement of this lesion is seen on subtraction imaging.  This is likely a complex hemorrhagic cystic lesion, and considered a Bosniak category 2 F lesion. No evidence of hydronephrosis. Stomach/Bowel: Unremarkable. Vascular/Lymphatic: No pathologically enlarged lymph nodes identified. No acute vascular findings. Other:  None. Musculoskeletal:  No suspicious bone lesions identified. IMPRESSION: Motion degraded exam. Moderate dilatation of the main pancreatic duct, with stricture in the pancreatic body and subtle soft tissue prominence suspicious for a small pancreatic mass. Recommend GI consultation for consideration of EUS/FNA. 2.6 cm complex subcapsular lesion in the right kidney is considered an indeterminate Bosniak category 2 F lesion. Recommend continued follow-up by renal protocol abdomen CT without and with contrast in 6 months. Left lower lobe airspace disease, suspicious for pneumonia. Electronically Signed   By: Norleen DELENA Kil M.D.   On: 04/04/2024 08:46   MR 3D Recon At Scanner Result Date: 04/04/2024 CLINICAL DATA:  Pancreatic ductal dilatation and possible  pancreatic mass. Indeterminate right renal lesion. EXAM: MRI ABDOMEN WITHOUT AND WITH CONTRAST TECHNIQUE: Multiplanar multisequence MR imaging of the abdomen was performed both before and after the administration of intravenous contrast. CONTRAST:  5mL GADAVIST  GADOBUTROL  1 MMOL/ML IV SOLN COMPARISON:  CT on 04/03/2024 FINDINGS: Lower chest: Left lower lobe airspace disease, suspicious for pneumonia. Hepatobiliary: Image degradation by motion artifact noted. No hepatic masses identified. Gallbladder is unremarkable. No evidence of biliary ductal dilatation. Pancreas: Image degradation by motion artifact noted. Moderate dilatation of the main pancreatic duct is seen in the body and tail. Stricture of the pancreatic duct is seen within the body, with subtle soft tissue prominence measuring 1.9 x 1.1 cm, best visualized on image 43/19. This is suspicious for a small pancreatic mass. Spleen:  Within normal limits in size and appearance. Adrenals/Urinary Tract: Image degradation by motion artifact noted, particularly the dynamic postcontrast sequences. Tiny benign-appearing Bosniak category 1 and 2 renal cysts are seen bilaterally. A complex subcapsular lesion is seen in the lateral lower pole of the right kidney which shows heterogeneous T1 hyperintensity and T2 hypointensity, and measures 2.6 x 2.5 cm. Although limited, no definite contrast enhancement of this lesion is seen on subtraction imaging. This is likely a complex hemorrhagic cystic lesion, and considered a Bosniak category 2 F lesion. No evidence of hydronephrosis. Stomach/Bowel: Unremarkable. Vascular/Lymphatic: No pathologically enlarged lymph nodes identified. No acute vascular findings. Other:  None. Musculoskeletal:  No suspicious bone lesions identified. IMPRESSION: Motion degraded exam. Moderate dilatation of the main pancreatic duct, with stricture in the pancreatic body and subtle soft tissue prominence suspicious for a small pancreatic mass.  Recommend GI consultation for consideration of EUS/FNA. 2.6 cm complex subcapsular lesion in the right kidney is considered an indeterminate Bosniak category 2 F lesion. Recommend continued follow-up by renal protocol abdomen CT without and with contrast in 6 months. Left lower lobe airspace disease, suspicious for pneumonia. Electronically Signed   By: Norleen DELENA Kil M.D.   On: 04/04/2024 08:46   CT HEAD WO CONTRAST ( ) Result Date: 04/03/2024 EXAM: CT HEAD AND CERVICAL SPINE 04/03/2024 04:32:45 AM TECHNIQUE: CT of the head and cervical spine was performed without the administration of intravenous contrast. Multiplanar reformatted images are provided for review. Automated exposure control, iterative reconstruction, and/or weight based adjustment of the mA/kV was utilized to reduce the radiation dose to as low as reasonably achievable. COMPARISON: Head CT 09/09/2020. No prior cervical spine imaging. CLINICAL HISTORY: Head trauma, abnormal mental status (Age 80-64y) FINDINGS: CT HEAD BRAIN AND VENTRICLES: No acute intracranial hemorrhage. No mass effect or midline shift. No abnormal extra-axial fluid collection.  There is encephalomalacia again noted due to a chronic right MCA infarct, centered in the right gangliocapsular area with extension into the posterior right frontal and anterior right parietal lobes and insula. Ex vacuo prominence of the right lateral ventricle is again noted as well. There is atrophy with atrophic ventriculomegaly and moderate to severe small vessel disease, which is advanced for age. There is a chronic lacunar infarct in the left basal ganglia. Asymmetric atrophy in the left cerebellar hemisphere. No cortical-based acute infarct. No hydrocephalus. There are patchy calcifications in both siphons. There are no hyperdense vessels. ORBITS: No acute abnormality. SINUSES AND MASTOIDS: There is chronic fluid in the right mastoid tip. There is increased left maxillary sinus membrane thickening  with trace intracavitary fluid. The other paranasal sinuses and left mastoid air cells are clear. SOFT TISSUES AND SKULL: No acute skull fracture. No acute soft tissue abnormality. CT CERVICAL SPINE LIMITATIONS: Imaging of the cervical spine is limited due to patient motion. BONES AND ALIGNMENT: There is a mild cervical kypholevoscoliosis, centered at C3-4. There is a 2 to 3 mm grade 1 retrolisthesis at C3-C4 and C5-C6, both believed to be related to degenerative disc disease. No traumatic malalignment is seen. No widening of the anterior atlantoaxial joint. No acute cervical fracture is seen through the motion artifacts. DEGENERATIVE CHANGES: There is disc collapse with a chronic appearance and mild endplate irregularities at C3-4 and C5-6, mild disc space loss at C4-C5 and moderate disc space loss at C6-C7. The disc spaces at C2-3, C7-T1 are normal in height. There is mild facet joint hypertrophy at the upper cervical levels, with mild left foraminal stenosis at C3-4 and C4-5. All other foramina are clear. There are anterior and posterior osteophytes from C3-4 through C6-7. There are posterior osteophytes encroaching on the ventral cord surface at C3-4, C4-5 and C5-6, with mild spinal stenosis at these levels. Other levels do not show significant encroachment on the thecal sac allowing for motion artifact. VASCULATURE: Both Proximal cervical ICAs are heavily calcified. SOFT TISSUES: There are stones in both palatine tonsils. No laryngeal mass is seen. No prevertebral soft tissue swelling. IMPRESSION: 1. No acute intracranial CT findings or depressed skull fractures. 2. Encephalomalacia due to a chronic right MCA infarct with ex vacuo prominence of the right lateral ventricle. 3. Atrophy with atrophic ventriculomegaly and moderate to severe small vessel disease, advanced for age. 4. Chronic lacunar infarct in the left basal ganglia. 5. Asymmetric atrophy in the left cerebellar hemisphere. 6. Mild cervical  kypholevoscoliosis centered at C3-4. 7. 2 to 3 mm grade 1 retrolisthesis at C3-C4 and C5-C6, likely related to degenerative disc disease. 8. Disc collapse with a chronic appearance and mild endplate irregularities at C3-4 and C5-6, mild disc space loss at C4-C5, and moderate disc space loss at C6-C7. Spondylosis. 9. Mild left foraminal stenosis at C3-4 and C4-5. All other foramina are clear. 10. Posterior osteophytes encroaching on the ventral cord surface at C3-4, C4-5, and C5-6, with mild spinal stenosis at these levels. Other levels do not show significant encroachment on the thecal sac, allowing for motion artifact. Electronically signed by: Francis Quam MD 04/03/2024 05:09 AM EST RP Workstation: [id number]   CT Cervical Spine Wo Contrast Result Date: 04/03/2024 EXAM: CT HEAD AND CERVICAL SPINE 04/03/2024 04:32:45 AM TECHNIQUE: CT of the head and cervical spine was performed without the administration of intravenous contrast. Multiplanar reformatted images are provided for review. Automated exposure control, iterative reconstruction, and/or weight based adjustment of the mA/kV was utilized to  reduce the radiation dose to as low as reasonably achievable. COMPARISON: Head CT 09/09/2020. No prior cervical spine imaging. CLINICAL HISTORY: Head trauma, abnormal mental status (Age 55-64y) FINDINGS: CT HEAD BRAIN AND VENTRICLES: No acute intracranial hemorrhage. No mass effect or midline shift. No abnormal extra-axial fluid collection. There is encephalomalacia again noted due to a chronic right MCA infarct, centered in the right gangliocapsular area with extension into the posterior right frontal and anterior right parietal lobes and insula. Ex vacuo prominence of the right lateral ventricle is again noted as well. There is atrophy with atrophic ventriculomegaly and moderate to severe small vessel disease, which is advanced for age. There is a chronic lacunar infarct in the left basal ganglia. Asymmetric atrophy  in the left cerebellar hemisphere. No cortical-based acute infarct. No hydrocephalus. There are patchy calcifications in both siphons. There are no hyperdense vessels. ORBITS: No acute abnormality. SINUSES AND MASTOIDS: There is chronic fluid in the right mastoid tip. There is increased left maxillary sinus membrane thickening with trace intracavitary fluid. The other paranasal sinuses and left mastoid air cells are clear. SOFT TISSUES AND SKULL: No acute skull fracture. No acute soft tissue abnormality. CT CERVICAL SPINE LIMITATIONS: Imaging of the cervical spine is limited due to patient motion. BONES AND ALIGNMENT: There is a mild cervical kypholevoscoliosis, centered at C3-4. There is a 2 to 3 mm grade 1 retrolisthesis at C3-C4 and C5-C6, both believed to be related to degenerative disc disease. No traumatic malalignment is seen. No widening of the anterior atlantoaxial joint. No acute cervical fracture is seen through the motion artifacts. DEGENERATIVE CHANGES: There is disc collapse with a chronic appearance and mild endplate irregularities at C3-4 and C5-6, mild disc space loss at C4-C5 and moderate disc space loss at C6-C7. The disc spaces at C2-3, C7-T1 are normal in height. There is mild facet joint hypertrophy at the upper cervical levels, with mild left foraminal stenosis at C3-4 and C4-5. All other foramina are clear. There are anterior and posterior osteophytes from C3-4 through C6-7. There are posterior osteophytes encroaching on the ventral cord surface at C3-4, C4-5 and C5-6, with mild spinal stenosis at these levels. Other levels do not show significant encroachment on the thecal sac allowing for motion artifact. VASCULATURE: Both Proximal cervical ICAs are heavily calcified. SOFT TISSUES: There are stones in both palatine tonsils. No laryngeal mass is seen. No prevertebral soft tissue swelling. IMPRESSION: 1. No acute intracranial CT findings or depressed skull fractures. 2. Encephalomalacia due  to a chronic right MCA infarct with ex vacuo prominence of the right lateral ventricle. 3. Atrophy with atrophic ventriculomegaly and moderate to severe small vessel disease, advanced for age. 4. Chronic lacunar infarct in the left basal ganglia. 5. Asymmetric atrophy in the left cerebellar hemisphere. 6. Mild cervical kypholevoscoliosis centered at C3-4. 7. 2 to 3 mm grade 1 retrolisthesis at C3-C4 and C5-C6, likely related to degenerative disc disease. 8. Disc collapse with a chronic appearance and mild endplate irregularities at C3-4 and C5-6, mild disc space loss at C4-C5, and moderate disc space loss at C6-C7. Spondylosis. 9. Mild left foraminal stenosis at C3-4 and C4-5. All other foramina are clear. 10. Posterior osteophytes encroaching on the ventral cord surface at C3-4, C4-5, and C5-6, with mild spinal stenosis at these levels. Other levels do not show significant encroachment on the thecal sac, allowing for motion artifact. Electronically signed by: Francis Quam MD 04/03/2024 05:09 AM EST RP Workstation: [id number]   CT CHEST ABDOMEN PELVIS WO CONTRAST  Result Date: 04/03/2024 CLINICAL DATA:  Increasing weakness. EXAM: CT CHEST, ABDOMEN AND PELVIS WITHOUT CONTRAST TECHNIQUE: Multidetector CT imaging of the chest, abdomen and pelvis was performed following the standard protocol without IV contrast. RADIATION DOSE REDUCTION: This exam was performed according to the departmental dose-optimization program which includes automated exposure control, adjustment of the mA and/or kV according to patient size and/or use of iterative reconstruction technique. COMPARISON:  Chest CT 08/20/2020 FINDINGS: CT CHEST FINDINGS Cardiovascular: Heart size upper normal. No substantial pericardial effusion. Coronary artery calcification is evident. Mild atherosclerotic calcification is noted in the wall of the thoracic aorta. Ascending thoracic aorta measures up to 4.2 cm diameter. Mediastinum/Nodes: No mediastinal  lymphadenopathy. No evidence for gross hilar lymphadenopathy although assessment is limited by the lack of intravenous contrast on the current study. The esophagus has normal imaging features. Axillary lymphadenopathy noted bilaterally. Left axillary index lymph node measures 17 mm short axis on 20/2. Lungs/Pleura: Centrilobular and paraseptal emphysema evident. 8 mm right middle lobe nodule on 134/4. 6 mm right lower lobe nodule on 132/4. Consolidative airspace disease in the left lower lobe. No substantial pleural effusion. Musculoskeletal: No worrisome lytic or sclerotic osseous abnormality. CT ABDOMEN PELVIS FINDINGS Hepatobiliary: No suspicious focal abnormality in the liver on this study without intravenous contrast. Gallbladder not well seen likely due to nondistention. No intra or extrahepatic biliary duct dilatation. Pancreas: There is marked diffuse dilatation of the main pancreatic duct measuring up to 7 mm diameter in the pancreatic tail region. Pancreatic duct becomes obscured near the junction of the body and tail (image 81/2) and again appears distended through the pancreatic head region. Imaging of the pancreas is limited by lack of intravenous contrast material. Spleen: No splenomegaly. No suspicious focal mass lesion. Adrenals/Urinary Tract: No adrenal nodule or mass. 2.5 x 2.6 cm heterogeneous exophytic lesion identified lower pole right kidney, potentially with some mineralization. Left kidney unremarkable. No hydroureter. The urinary bladder appears normal for the degree of distention. Stomach/Bowel: Diffuse gastric wall thickening evident, likely accentuated by underdistention. Duodenum is normally positioned as is the ligament of Treitz. No small bowel wall thickening. No small bowel dilatation. No gross colonic mass. No colonic wall thickening. Moderate stool volume evident. Vascular/Lymphatic: There is advanced atherosclerotic calcification of the abdominal aorta without aneurysm. There is  no gastrohepatic or hepatoduodenal ligament lymphadenopathy. 12 mm short axis left para-aortic lymph node evident. Other mildly enlarged retroperitoneal lymph nodes are identified. 13 mm short axis right common iliac node on 106/2. Mild lymphadenopathy is identified in the groin regions. Reproductive: Prostate gland is enlarged. Other: No substantial intraperitoneal free fluid. Musculoskeletal: Extremely large left groin hernia contains small bowel and colon. There is some congestion in the small bowel mesentery of the herniated segments with probable small volume fluid in the hernia sac. No features to suggest overt bowel obstruction due to the herniated bowel segments. No worrisome lytic or sclerotic osseous abnormality. IMPRESSION: 1. Consolidative airspace disease in the left lower lobe compatible pneumonia. Imaging follow-up recommended to ensure complete resolution. 2. Extremely large left groin hernia contains small bowel and colon. There is some congestion in the small bowel mesentery of the herniated segments with probable small volume fluid in the hernia sac. No features to suggest overt bowel obstruction due to the herniated bowel segments. Correlation for signs/symptoms of incarceration recommended. 3. Marked diffuse dilatation of the main pancreatic duct measuring up to 7 mm diameter in the pancreatic tail region. Pancreatic duct becomes obscured near the junction of the body  and tail without a discernible mass lesion on noncontrast imaging. Main pancreatic duct is also distended through the pancreatic head region. Imaging of the pancreas is limited by lack of intravenous contrast material. MRI of the abdomen with and without contrast recommended to further evaluate. 4. 2.5 x 2.6 cm heterogeneous exophytic lesion lower pole right kidney, potentially with some mineralization. Findings raise concern for renal cell carcinoma. This could also be further assessed at the time of MRI. 5. Axillary,  retroperitoneal, and groin lymphadenopathy. Lymphoproliferative disorder not excluded. 6. 8 mm right middle lobe and 6 mm right lower lobe pulmonary nodules. Non-contrast chest CT at 3-6 months is recommended. If the nodules are stable at time of repeat CT, then future CT at 18-24 months (from today's scan) is considered optional for low-risk patients, but is recommended for high-risk patients. This recommendation follows the consensus statement: Guidelines for Management of Incidental Pulmonary Nodules Detected on CT Images: From the Fleischner Society 2017; Radiology 2017; 284:228-243. 7. 4.2 cm ascending thoracic aortic aneurysm. Recommend annual imaging followup by CTA or MRA. This recommendation follows 2010 ACCF/AHA/AATS/ACR/ASA/SCA/SCAI/SIR/STS/SVM Guidelines for the Diagnosis and Management of Patients with Thoracic Aortic Disease. Circulation. 2010; 121: Z733-z630. Aortic aneurysm NOS (ICD10-I71.9) 8. Aortic Atherosclerosis (ICD10-I70.0) and Emphysema (ICD10-J43.9). Electronically Signed   By: Camellia Candle M.D.   On: 04/03/2024 05:02      Time coordinating discharge: over 30 minutes  SIGNED:  Benisha Hadaway DO [facility]

## 2024-04-18 NOTE — TOC Progression Note (Signed)
 Transition of Care [facility]) - Progression Note    Patient Details  Name: Darin Hopkins MRN: [id number] Date of Birth: 03/06/1961  [facility] CM/SW Contact  Buell Parcel L [place]ma [place], [place] Phone Number: 04/18/2024, 11:30 AM  Clinical Narrative:     Follow-up received from [facility] regarding hospital bed. CSW was advised that the sister requested that the bed be delivered to the home on Monday. Discharge delayed until hospital bed is delivered.   Expected Discharge Plan: Home/Self Care Barriers to Discharge: No Barriers Identified               Expected Discharge Plan and Services         Expected Discharge Date: 04/16/24                                     Social Drivers of Health (SDOH) Interventions SDOH Screenings   Food Insecurity: No Food Insecurity (04/03/2024)  Housing: Low Risk  (04/03/2024)  Transportation Needs: No Transportation Needs (04/03/2024)  Utilities: Not At Risk (04/03/2024)  Depression (PHQ2-9): Low Risk  (02/11/2022)  Tobacco Use: Medium Risk (04/03/2024)    Readmission Risk Interventions     No data to display

## 2024-04-18 NOTE — Progress Notes (Signed)
 Patient came back from home because it was unsafe.  He came back  with lokelma , and inhaler( stored on patient specific bin). He did not bring the gift card he was sent home with . He said that he gave it to [facility].

## 2024-04-18 NOTE — TOC Transition Note (Signed)
 Transition of Care [facility]) - Discharge Note   Patient Details  Name: Darin Hopkins MRN: [id number] Date of Birth: [birth year]  [facility] CM/SW Contact:  Alvaro Louder, LCSW Phone Number: 04/18/2024, 2:25 PM   Clinical Narrative:   LCSWA confirmed with MD that patient is stable for discharge. LCSWA met with patient at bedside to confirm and they are in agreement with discharge. LCSWA discussed with patient that his insurance did not cover HH. LCSWA pivoted to OP rehab. LCSWA faxed out OP rehab documentation to start services. Transport arranged with [facility] for next available.  Per [facility] coordinator the patients niece requested that DME Hospital bed be delivered on 12/17 due to the treatment of bed bugs in the home. LCSWA reached out to [facility] DME coordinator to set up equipment delivery.  TOC signing off  Final next level of care: Skilled Nursing Facility Barriers to Discharge: No Barriers Identified   Patient Goals and CMS Choice Patient states their goals for this hospitalization and ongoing recovery are:: Just go back home          Discharge Placement              Patient chooses bed at:  [facility]) Patient to be transferred to facility by: [facility] Name of family member notified: [name] Patient and family notified of of transfer: 04/12/24  Discharge Plan and Services Additional resources added to the After Visit Summary for                                       Social Drivers of Health (SDOH) Interventions SDOH Screenings   Food Insecurity: No Food Insecurity (04/03/2024)  Housing: Low Risk  (04/03/2024)  Transportation Needs: No Transportation Needs (04/03/2024)  Utilities: Not At Risk (04/03/2024)  Depression (PHQ2-9): Low Risk  (02/11/2022)  Tobacco Use: Medium Risk (04/03/2024)     Readmission Risk Interventions     No data to display

## 2024-04-18 NOTE — Progress Notes (Signed)
 [facility] arrived to pick up patient. Upon vitals check, patient's systolic BP was in the 180s x3. [facility] staff not comfortable transporting patient. Dr. Marsa notified who ordered 1x dose of clonidine  which was given by this RN. Dr. Marsa advised rechecking BP in an hour and discharging patient if BP decreased. This RN also was holding the gift cards that are to be sent home with patient. As of shift change, this RN handed off gift cards to charge RN Artemus and updated her on situation.

## 2024-04-18 NOTE — Plan of Care (Signed)

## 2024-04-19 MED ORDER — HEPARIN SODIUM (PORCINE) 5000 UNIT/ML IJ SOLN
5000.0000 [IU] | Freq: Three times a day (TID) | INTRAMUSCULAR | Status: DC
  Administered 2024-04-19 – 2024-04-21 (×7): 5000 [IU] via SUBCUTANEOUS
  Filled 2024-04-19 (×7): qty 1

## 2024-04-19 NOTE — Telephone Encounter (Signed)
 [facility]. Discussed with the patient's hospitalist team today that the previously available date is no longer available. I will not have access for EUS capability this year or early January. They will work with PCP and with the hospital to see what referral could be placed to [facility] around the area or to another provider who performs EUS.  This referral can be cancelled.  Aloha Finner, MD [facility] Office # [phone]

## 2024-04-19 NOTE — Plan of Care (Signed)

## 2024-04-19 NOTE — Telephone Encounter (Signed)
 Spoke with Darin Hopkins with referring providers office. States referring provider is currently out of town. She will have provider Laneta alexander contact our office to further advise on previous note.   States patient was discharged from the hospital yesterday.   Waiting for further response.

## 2024-04-19 NOTE — Progress Notes (Signed)
 Physical Therapy Treatment Patient Details Name: Darin Hopkins MRN: [id number] DOB: 05-Jan-1961 Today's Date: 04/19/2024   History of Present Illness presented to ER secondary to progressive weakness, hypoxia (noted with O2 tank empty); admitted for management of COPD exacerbation.    PT Comments  Pt received in bed, very pleasant and cooperative to work with PT. Session focused on short distance gait training in room to maintain current LOF. Overall good balance with RW, no LOB, however does desat quickly down to 70% while on 5L O2 (O2 increased for exertion). Once pt recovered while resting in bed, O2 sats stable at 94% on 3L O2. MD and nursing notified. Will continue per POC.    If plan is discharge home, recommend the following: A little help with walking and/or transfers;A little help with bathing/dressing/bathroom   Can travel by private vehicle     Yes  Equipment Recommendations  Rolling walker (2 wheels)    Recommendations for Other Services       Precautions / Restrictions Precautions Precautions: Fall Recall of Precautions/Restrictions: Impaired Restrictions Weight Bearing Restrictions Per Provider Order: No     Mobility  Bed Mobility Overal bed mobility: Modified Independent             General bed mobility comments: increased time/effort and cues to boost himself up    Transfers Overall transfer level: Needs assistance Equipment used: Rolling walker (2 wheels) Transfers: Sit to/from Stand Sit to Stand: Contact guard assist           General transfer comment:  (Pt able to stand fon first attempt)    Ambulation/Gait Ambulation/Gait assistance: Contact guard assist Gait Distance (Feet): 30 Feet Assistive device: Rolling walker (2 wheels) Gait Pattern/deviations: Step-to pattern, Staggering left, Staggering right, Trunk flexed, Decreased step length - right, Decreased step length - left Gait velocity: decr     General Gait Details:  (Pt required  5L O2 to ambulate 41ft, SpO2 dropped to 70% requiring several minutes to return to 94%. Pt left on home dose of 3L O2 and stable.)   Stairs             Wheelchair Mobility     Tilt Bed    Modified Rankin (Stroke Patients Only)       Balance Overall balance assessment: Needs assistance Sitting-balance support: Single extremity supported, Feet supported, Feet unsupported Sitting balance-Leahy Scale: Fair     Standing balance support: Bilateral upper extremity supported, During functional activity, Reliant on assistive device for balance Standing balance-Leahy Scale: Fair Standing balance comment:  (Reliant on RW for gait)                            Communication Communication Communication: No apparent difficulties  Cognition                                 Following commands: Impaired Following commands impaired: Follows multi-step commands with increased time    Cueing Cueing Techniques: Verbal cues, Tactile cues  Exercises      General Comments        Pertinent Vitals/Pain Pain Assessment Pain Assessment: No/denies pain    Home Living                          Prior Function            PT Goals (  current goals can now be found in the care plan section) Acute Rehab PT Goals Patient Stated Goal: to return home    Frequency    Min 2X/week      PT Plan      Co-evaluation              AM-PAC PT 6 Clicks Mobility   Outcome Measure  Help needed turning from your back to your side while in a flat bed without using bedrails?: None Help needed moving from lying on your back to sitting on the side of a flat bed without using bedrails?: None Help needed moving to and from a bed to a chair (including a wheelchair)?: None Help needed standing up from a chair using your arms (e.g., wheelchair or bedside chair)?: A Little Help needed to walk in hospital room?: A Little Help needed climbing 3-5 steps with a  railing? : A Little 6 Click Score: 21    End of Session Equipment Utilized During Treatment: Gait belt Activity Tolerance: Patient tolerated treatment well Patient left: in bed;with call bell/phone within reach;with bed alarm set Nurse Communication: Mobility status PT Visit Diagnosis: Muscle weakness (generalized) (M62.81);Difficulty in walking, not elsewhere classified (R26.2)     Time: 1401-1420 PT Time Calculation (min) (ACUTE ONLY): 19 min  Charges:    $Therapeutic Activity: 8-22 mins PT General Charges $$ ACUTE PT VISIT: 1 Visit                    Darice Bohr, PTA  [name] 04/19/2024, 2:58 PM

## 2024-04-19 NOTE — Progress Notes (Signed)
 PROGRESS NOTE    Darin Hopkins   FMW:[id number] DOB: Nov 24, 1960  DOA: 04/03/2024 Date of Service: 04/19/2024 which is hospital day 16  PCP: [name], NP    Hospital course / significant events:   HPI: 63 y.o. male with a PMH significant for COPD, HTN, left inguinal hernia, GERD, CHF, history of seizure, HLD, alcohol dependence, protein calorie malnutrition, CKD 3. He presented from home to the ED on 04/03/2024 with concerns for weakness x 2 days.  Per EMS report, patient's O2 saturations on their initial evaluation were in the 50s which appears to be due to the fact that his oxygen tank was empty and chronically is supposed to be on 2 to 3 L.  They also reported significant bedbugs in his home.  He was put on home oxygen with significant improvement.  11/25: to ED. Stable vital signs on 2 L nasal cannula.  It appears that patient was put on BiPAP at some point earlier this morning but there is no documented desaturation or reason for this and when hospitalist evaluated patient he was stable and comfortable so transitioned back to his home nasal cannula oxygen. Other:  Essentially pan CT scanned in the ED with several findings:  Possible left lower lobe pneumonia on chest CT, Large left groin hernia without obstruction or incarceration, Abnormal findings in the pancreas on CT scan were concerning for mass but not fully evaluated so radiology recommended abdominal MRI to further evaluate, mass on lower pole of right kidney, multiple lung nodules rec CT follow-up in 3 to 6 months.. He was tx with Mucinex , DuoNeb, Solu-Medrol , ceftriaxone , doxycycline . Admitted to hospitalist  MRI of the abdomen showed motion degraded exam, moderate dilatation of main pancreatic duct with and subtle soft tissue prominence suspicious for small pancreatic mass recommending EUS for further consideration, 2.6 cm complex subcapsular lesion of the right kidney considered intermediate to Bosniak category 40F lesion  recommend continue follow-up by renal protocol abdomen CT with and 6 months. 12/3.  Received insurance authorization for rehab.  I ordered labs for today since potassium was high yesterday.  Hemoglobin drifted down to 7.3, potassium 5.  Creatinine 1.72.  Will check hemoglobin again tomorrow with type and cross.  IV fluids stopped. 12/4.  Hemoglobin drifted down to 6.7.  1 unit of packed red blood cells given and hemoglobin up to 9.4.  Patient feels well after transfusion.  No shortness of breath.  Family refused going out to rehab. 12/5.  Unable to reach family today.  Will treat high potassium today. 12/6.  Hemoglobin down to 7.7.  Given IV iron .  Potassium 5.2. 12/7.  Tried to contact family about discharge.  The patient has no money because his wallet is missing.  I advised that we could have gotten medications to the [facility] pharmacy.  Patient would like his medications to go to the [place] but family concerned that since he does not have an ID he will be able to get them. 12/8.  Patient feeling well.  Hemoglobin 7.9.  Give another dose of IV iron .  Creatinine 1.82 and potassium 4.7.  Lokelma  prescription went into [facility] pharmacy which was delivered.  Other prescription sent to [facility] clinic pharmacy.  Patient able to pick up prescriptions at the [facility] clinic pharmacy without an ID.  Patient discharged. 12/9.  Apparently hospital bed not delivered yesterday.  Spoke with TOC and sister did not schedule a delivery time for today. 12/10: pt stable for DC and WAS sent home w/  EMS but they brought him back when realized hospital bed had never acutally been delivered - see TOC notes  12/11: stable      Consultants:  Palliative care  General surgery   Procedures/Surgeries: mpme      ASSESSMENT & PLAN:   Hyperkalemia Patient received Lokelma  during the hospital course.   Low potassium diet.   Lokelma  daily upon going home and this was prescribed through the [facility] pharmacy.   Anemia likely  chronic inflammation Hemoglobin 6.7 --> 1 unit PRBC.   Status post IV iron  x 2 doses.    No signs of bleeding PPI Hold ASA Monitor CBC   COPD exacerbation  Chronic hypoxic resp fail on O2 at home but was out of this initially causing acute on chronic failure  Treated during hospital course Nebs as needed   Acute kidney injury superimposed on CKD Acute kidney injury on CKD stage IIIb. Creatinine peaked at 2.77.   Refer to nephrology as outpatient.  follow BMP.    Hypernatremia Sodium normalized with IV fluids.  Encourage po hydration  Monitor BMP   Pancreatic mass CA 19-9 only a few points higher than the normal range.   outpatient follow-up with Dr. Wilhelmenia for endoscopic ultrasound.   Kidney lesion, native, right MRI showing 2.6 cm complex subcapsular lesion of the right kidney considered indeterminate  follow-up with renal protocol CT in 6 months.  With chronic kidney disease may have to repeat with an MRI.   Left inguinal hernia Seen by general surgery and not a surgical candidate currently.   Weakness We had rehab set up the other day but family refused and now they will take home with outpatient physical therapy.   Chronic diastolic CHF (congestive heart failure) No signs of congestive heart failure on this hospital course even with giving IV fluids and blood.   Can go back on Farxiga  as outpatient.   No ACE or ARB or spironolactone with acute kidney injury and hyperkalemia.   On low-dose bisoprolol .   Chronic respiratory failure with hypoxia and hypercapnia (HCC) On 3 L of oxygen.   Patient set up with portable tanks.  Already has a concentrator.   History of seizure On Keppra       underweight based on BMI: Body mass index is 18.44 kg/m.SABRA Significantly low or high BMI is associated with higher medical risk.  Underweight - under 18  overweight - 25 to 29 obese - 30 or more Class 1 obesity: BMI of 30.0 to 34 Class 2 obesity: BMI of 35.0 to 39 Class  3 obesity: BMI of 40.0 to 49 Super Morbid Obesity: BMI 50-59 Super-super Morbid Obesity: BMI 60+ Healthy nutrition and physical activity advised as adjunct to other disease management and risk reduction treatments    DVT prophylaxis: heparin  IV fluids: no continuous IV fluids  Nutrition: remal / low K diet Central lines / other devices: none  Code Status: FULL CODE ACP documentation reviewed: none on file in [facility]  TOC needs: see TOC notes  Medical barriers to dispo: none.            Subjective / Brief ROS:  Patient reports no concerns at this time Denies CP/SOB.  Pain controlled.  Denies new weakness.  Tolerating diet.  Reports no concerns w/ urination/defecation.   Family Communication: none at bedside - see TOC notes, no medical updates today     Objective Findings:  Vitals:   04/18/24 2020 04/18/24 2250 04/19/24 0413 04/19/24 0758  BP: (!) 158/87 ROLLEN)  135/95 125/75 125/69  Pulse: 62 68 (!) 50 (!) 52  Resp: 17  17 19   Temp: 98.9 F (37.2 C) 98.6 F (37 C) 98.5 F (36.9 C) 98.7 F (37.1 C)  TempSrc:  Oral    SpO2: 98% 97% 100% 97%  Weight:      Height:        Intake/Output Summary (Last 24 hours) at 04/19/2024 1408 Last data filed at 04/19/2024 1100 Gross per 24 hour  Intake 240 ml  Output 850 ml  Net -610 ml   Filed Weights   04/03/24 0341 04/03/24 2236  Weight: 51.7 kg 53.4 kg    Examination:  Physical Exam Constitutional:      General: He is not in acute distress. Cardiovascular:     Rate and Rhythm: Normal rate and regular rhythm.  Pulmonary:     Effort: Pulmonary effort is normal.     Breath sounds: Normal breath sounds.  Musculoskeletal:     Right lower leg: No edema.     Left lower leg: No edema.  Neurological:     Mental Status: He is alert. Mental status is at baseline.  Psychiatric:        Mood and Affect: Mood normal.        Behavior: Behavior normal.          Scheduled Medications:   bisoprolol   2.5 mg Oral  QHS   guaiFENesin   600 mg Oral BID   heparin  injection (subcutaneous)  5,000 Units Subcutaneous Q8H   influenza vac split trivalent PF  0.5 mL Intramuscular Tomorrow-1000   levETIRAcetam   750 mg Oral BID   pantoprazole   40 mg Oral BID   pneumococcal 20-valent conjugate vaccine  0.5 mL Intramuscular Tomorrow-1000   sodium zirconium cyclosilicate   10 g Oral Daily   traZODone   100 mg Oral QHS   umeclidinium bromide   1 puff Inhalation Daily    Continuous Infusions:   PRN Medications:  acetaminophen  **OR** acetaminophen , albuterol , HYDROcodone -acetaminophen , ipratropium-albuterol , ondansetron  **OR** ondansetron  (ZOFRAN ) IV  Antimicrobials from admission:  Anti-infectives (From admission, onward)    Start     Dose/Rate Route Frequency Ordered Stop   04/05/24 0600  cefTRIAXone  (ROCEPHIN ) 2 g in sodium chloride  0.9 % 100 mL IVPB        2 g 200 mL/hr over 30 Minutes Intravenous Every 24 hours 04/04/24 1030 04/10/24 0355   04/04/24 0600  cefTRIAXone  (ROCEPHIN ) 1 g in sodium chloride  0.9 % 100 mL IVPB  Status:  Discontinued        1 g 200 mL/hr over 30 Minutes Intravenous Every 24 hours 04/03/24 1129 04/04/24 1030   04/03/24 0600  doxycycline  (VIBRAMYCIN ) 100 mg in sodium chloride  0.9 % 250 mL IVPB        100 mg 125 mL/hr over 120 Minutes Intravenous  Once 04/03/24 0513 04/03/24 0819   04/03/24 0515  cefTRIAXone  (ROCEPHIN ) 2 g in sodium chloride  0.9 % 100 mL IVPB        2 g 200 mL/hr over 30 Minutes Intravenous  Once 04/03/24 0513 04/03/24 0600           Data Reviewed:  I have personally reviewed the following...  CBC: Recent Labs  Lab 04/13/24 0549 04/14/24 0449 04/15/24 0721 04/16/24 0507  HGB 8.5* 7.7* 8.1* 7.9*   Basic Metabolic Panel: Recent Labs  Lab 04/13/24 0549 04/14/24 0449 04/15/24 0721 04/16/24 0507 04/17/24 0501 04/18/24 0759  NA 141 139 140 140  --  142  K 5.2* 5.2*  5.1 4.7  --  4.6  CL 104 104 104 103  --  106  CO2 34* 34* 33* 33*  --  34*   GLUCOSE 69* 73 77 72  --  80  BUN 30* 28* 28* 27*  --  30*  CREATININE 1.78* 1.71* 1.78* 1.82* 1.78* 1.92*  CALCIUM  8.4* 8.2* 8.5* 8.5*  --  8.6*   GFR: Estimated Creatinine Clearance: 29.7 mL/min (A) (by C-G formula based on SCr of 1.92 mg/dL (H)). Liver Function Tests: No results for input(s): AST, ALT, ALKPHOS, BILITOT, PROT, ALBUMIN in the last 168 hours. No results for input(s): LIPASE, AMYLASE in the last 168 hours. No results for input(s): AMMONIA in the last 168 hours. Coagulation Profile: No results for input(s): INR, PROTIME in the last 168 hours. Cardiac Enzymes: No results for input(s): CKTOTAL, CKMB, CKMBINDEX, TROPONINI in the last 168 hours. BNP (last 3 results) Recent Labs    04/03/24 0359  PROBNP 3,058.0*   HbA1C: No results for input(s): HGBA1C in the last 72 hours. CBG: No results for input(s): GLUCAP in the last 168 hours. Lipid Profile: No results for input(s): CHOL, HDL, LDLCALC, TRIG, CHOLHDL, LDLDIRECT in the last 72 hours. Thyroid  Function Tests: No results for input(s): TSH, T4TOTAL, FREET4, T3FREE, THYROIDAB in the last 72 hours. Anemia Panel: No results for input(s): VITAMINB12, FOLATE, FERRITIN, TIBC, IRON , RETICCTPCT in the last 72 hours. Most Recent Urinalysis On File:     Component Value Date/Time   COLORURINE YELLOW (A) 04/03/2024 0814   APPEARANCEUR HAZY (A) 04/03/2024 0814   APPEARANCEUR Clear 11/02/2013 0205   LABSPEC 1.013 04/03/2024 0814   LABSPEC 1.005 11/02/2013 0205   PHURINE 5.0 04/03/2024 0814   GLUCOSEU 50 (A) 04/03/2024 0814   GLUCOSEU Negative 11/02/2013 0205   HGBUR NEGATIVE 04/03/2024 0814   BILIRUBINUR NEGATIVE 04/03/2024 0814   BILIRUBINUR Negative 11/02/2013 0205   KETONESUR NEGATIVE 04/03/2024 0814   PROTEINUR 100 (A) 04/03/2024 0814   NITRITE NEGATIVE 04/03/2024 0814   LEUKOCYTESUR NEGATIVE 04/03/2024 0814   LEUKOCYTESUR Negative 11/02/2013 0205    Sepsis Labs: @LABRCNTIP (procalcitonin:4,lacticidven:4) Microbiology: No results found for this or any previous visit (from the past 240 hours).    Radiology Studies last 3 days: No results found.     Time spent: 50 min     Renold Kozar, DO [facility] 04/19/2024, 2:08 PM    Dictation software may have been used to generate the above note. Typos may occur and escape review in typed/dictated notes. Please contact Dr Marsa directly for clarity if needed.  Staff may message me via secure chat in Epic  but this may not receive an immediate response,  please page me for urgent matters!  If 7PM-7AM, please contact night coverage [email or web address]

## 2024-04-19 NOTE — Evaluation (Signed)
 Occupational Therapy Re-Evaluation Patient Details Name: Darin Hopkins MRN: [id number] DOB: [birth year] Today's Date: 04/19/2024   History of Present Illness   presented to ER secondary to progressive weakness, hypoxia (noted with O2 tank empty); admitted for management of COPD exacerbation.     Clinical Impressions Pt seen for OT re-evaluation and tx. Pt declined OOB for ADL 2/2 recently up with PT but agreeable to EOB ADL session. Denies complaints. Pt demo'd mod indep with bed mobility, cue to Darin up on the side of the bed to maximize static sitting balance. Pt declined to attempt standing at sink. Cues to hold toothbrush with L hand while applying toothpaste with R hand. Switches brush to R hand, uses LUE propped on bed to support sitting balance. With cues did hold the spit basin wiht L hand to spit into. Set up for washing face sitting. Pt tolerated well, however, did require VC for PLB afterwards to support breath recovery. Pt continues to benefit from skilled OT services. Goals reviewed and updated to reflect progress.      If plan is discharge home, recommend the following:   A little help with walking and/or transfers;A little help with bathing/dressing/bathroom;Supervision due to cognitive status;Assistance with cooking/housework;Assist for transportation;Help with stairs or ramp for entrance     Functional Status Assessment   Patient has had a recent decline in their functional status and demonstrates the ability to make significant improvements in function in a reasonable and predictable amount of time.     Equipment Recommendations   None recommended by OT     Recommendations for Other Services         Precautions/Restrictions   Precautions Precautions: Fall Recall of Precautions/Restrictions: Impaired Restrictions Weight Bearing Restrictions Per Provider Order: No     Mobility Bed Mobility Overal bed mobility: Modified Independent                   Transfers Overall transfer level: Needs assistance Equipment used: None Transfers: Sit to/from Stand Sit to Stand: Contact guard assist           General transfer comment: CGA to stand x2 wihtout UE support, CGA to take 2 lateral steps EOB      Balance Overall balance assessment: Needs assistance Sitting-balance support: Single extremity supported, Feet supported, Feet unsupported, No upper extremity supported Sitting balance-Leahy Scale: Fair Sitting balance - Comments: fair-, tendency to lean to L which 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 Cues to hold toothbrush with L hand while applying toothpaste with R hand. Switches brush to R hand, uses LUE propped on bed to support sitting balance. With cues did hold the spit basin wiht L hand to spit into. Set up for washing face sitting.  Vision         Perception         Praxis         Pertinent Vitals/Pain Pain Assessment Pain Assessment: No/denies pain     Extremity/Trunk Assessment Upper Extremity Assessment Upper Extremity Assessment: Generalized weakness   Lower Extremity Assessment Lower Extremity Assessment: Generalized weakness       Communication Communication Communication: No apparent difficulties   Cognition Arousal: Alert Behavior During Therapy: WFL for tasks assessed/performed Cognition: No  family/caregiver present to determine baseline             OT - Cognition Comments: Pt alert and oriented, demo's some delay in processing and problem solving                 Following commands: Impaired Following commands impaired: Follows multi-step commands with increased time     Cueing  General Comments   Cueing Techniques: Verbal cues;Tactile cues      Exercises Other Exercises Other Exercises: Pt edu PLB to support breath recovery   Shoulder Instructions      Home Living Family/patient expects to be discharged to:: Private residence Living Arrangements: Other relatives Available Help at Discharge: Family Type of Home: Mobile home Home Access: Stairs to enter Entrance Stairs-Number of Steps: 1   Home Layout: One level     Bathroom Shower/Tub: Sponge bathes at baseline   Bathroom Toilet: Standard Bathroom Accessibility: No   Home Equipment: Agricultural Consultant (2 wheels);Wheelchair - manual;BSC/3in1          Prior Functioning/Environment Prior Level of Function : Needs assist;Patient poor historian/Family not available       Physical Assist : ADLs (physical);Mobility (physical) Mobility (physical): Transfers;Gait ADLs (physical): Grooming;Bathing;Dressing;Toileting;IADLs Mobility Comments: uses walker at baseline ADLs Comments: sedentary, minimal particpiation with ADLs    OT Problem List: Decreased strength;Decreased activity tolerance;Decreased safety awareness;Cardiopulmonary status limiting activity;Impaired balance (sitting and/or standing)   OT Treatment/Interventions: Self-care/ADL training;Therapeutic exercise;Energy conservation;DME and/or AE instruction;Therapeutic activities;Patient/family education;Balance training      OT Goals(Current goals can be found in the care plan section)   Acute Rehab OT Goals Patient Stated Goal: go home, get better OT Goal Formulation: With patient Time For Goal Achievement: 05/03/24 Potential to  Achieve Goals: Good ADL Goals Pt Will Perform Grooming: with modified independence;sitting;standing;with set-up Pt Will Transfer to Toilet: with modified independence;ambulating;regular height toilet;bedside commode (LRAD) Additional ADL Goal #1: Pt will independently use learned ECS including PLB and activity pacing during ADL adn mobility tasks to maximize breath recovery, 3/3 opportunities.   OT Frequency:  Min 2X/week    Co-evaluation              AM-PAC OT 6 Clicks Daily Activity     Outcome Measure Help from another person eating meals?: None Help from another person taking care of personal grooming?: A Little Help from another person toileting, which includes using toliet, bedpan, or urinal?: A Little Help from another person bathing (including washing, rinsing, drying)?: A Little Help from another person to put on and taking off regular upper body clothing?: None Help from another person to put on and taking off regular lower body clothing?: A Little 6 Click Score: 20   End of Session Equipment Utilized During Treatment: Oxygen  Activity Tolerance: Patient tolerated treatment well Patient left: in bed;with call bell/phone within reach;with bed alarm set  OT Visit Diagnosis: Unsteadiness on feet (R26.81);Muscle weakness (generalized) (M62.81);History of falling (Z91.81);Other abnormalities of gait and mobility (R26.89)  Time: 8488-8470 OT Time Calculation (min): 18 min Charges:  OT General Charges $OT Visit: 1 Visit OT Evaluation $OT Re-eval: 1 Re-eval OT Treatments $Self Care/Home Management : 8-22 mins  Warren SAUNDERS., MPH, MS, OTR/L ascom [phone] 04/19/2024, 4:05 PM

## 2024-04-19 NOTE — TOC Progression Note (Signed)
 Transition of Care [facility]) - Progression Note    Patient Details  Name: Darin Hopkins MRN: [id number] Date of Birth: August 03, 1960  [facility] CM/SW Contact  Mandel Seiden  [place], [place] Phone Number: 04/19/2024, 4:21 PM  Clinical Narrative:   LCSWA confirmed with [employer] [facility] that hospital bed will be delivered tomorrow around 4-5 at the families request. LCSWA also confirmed with patients sister Ronal to schedule transportation for 6:00 PM tomorrow.   Sister Ronal indicated that the house will be treated for bed bugs tomorrow morning and will be ready in the evening. LCSWA to set up OP rehab at DC.   TOC to follow for discharge    Expected Discharge Plan: Home/Self Care Barriers to Discharge: No Barriers Identified               Expected Discharge Plan and Services         Expected Discharge Date: 04/18/24                                     Social Drivers of Health (SDOH) Interventions SDOH Screenings   Food Insecurity: No Food Insecurity (04/03/2024)  Housing: Low Risk (04/03/2024)  Transportation Needs: No Transportation Needs (04/03/2024)  Utilities: Not At Risk (04/03/2024)  Depression (PHQ2-9): Low Risk (02/11/2022)  Tobacco Use: Medium Risk (04/03/2024)    Readmission Risk Interventions     No data to display

## 2024-04-20 ENCOUNTER — Ambulatory Visit: Admitting: Family

## 2024-04-20 LAB — CBC
HCT: 26 % — ABNORMAL LOW (ref 39.0–52.0)
Hemoglobin: 7.7 g/dL — ABNORMAL LOW (ref 13.0–17.0)
MCH: 28.3 pg (ref 26.0–34.0)
MCHC: 29.6 g/dL — ABNORMAL LOW (ref 30.0–36.0)
MCV: 95.6 fL (ref 80.0–100.0)
Platelets: 143 K/uL — ABNORMAL LOW (ref 150–400)
RBC: 2.72 MIL/uL — ABNORMAL LOW (ref 4.22–5.81)
RDW: 13.7 % (ref 11.5–15.5)
WBC: 8 K/uL (ref 4.0–10.5)
nRBC: 0 % (ref 0.0–0.2)

## 2024-04-20 LAB — BASIC METABOLIC PANEL WITH GFR
Anion gap: 3 — ABNORMAL LOW (ref 5–15)
BUN: 28 mg/dL — ABNORMAL HIGH (ref 8–23)
CO2: 32 mmol/L (ref 22–32)
Calcium: 8.9 mg/dL (ref 8.9–10.3)
Chloride: 104 mmol/L (ref 98–111)
Creatinine, Ser: 1.86 mg/dL — ABNORMAL HIGH (ref 0.61–1.24)
GFR, Estimated: 40 mL/min — ABNORMAL LOW (ref >60)
Glucose, Bld: 79 mg/dL (ref 70–99)
Potassium: 4.6 mmol/L (ref 3.5–5.1)
Sodium: 140 mmol/L (ref 135–145)

## 2024-04-20 NOTE — Plan of Care (Signed)
   Problem: Education: Goal: Knowledge of General Education information will improve Description Including pain rating scale, medication(s)/side effects and non-pharmacologic comfort measures Outcome: Progressing

## 2024-04-20 NOTE — Progress Notes (Signed)
 PROGRESS NOTE    Darin Hopkins   FMW:[id number] DOB: 05-20-1960  DOA: 04/03/2024 Date of Service: 04/20/2024 which is hospital day 17  PCP: [name], NP    Hospital course / significant events:   HPI: 63 y.o. male with a PMH significant for COPD, HTN, left inguinal hernia, GERD, CHF, history of seizure, HLD, alcohol dependence, protein calorie malnutrition, CKD 3. He presented from home to the ED on 04/03/2024 with concerns for weakness x 2 days.  Per EMS report, patient's O2 saturations on their initial evaluation were in the 50s which appears to be due to the fact that his oxygen tank was empty and chronically is supposed to be on 2 to 3 L.  They also reported significant bedbugs in his home.  He was put on home oxygen with significant improvement.  11/25: to ED. Stable vital signs on 2 L nasal cannula.  It appears that patient was put on BiPAP at some point earlier this morning but there is no documented desaturation or reason for this and when hospitalist evaluated patient he was stable and comfortable so transitioned back to his home nasal cannula oxygen. Other:  Essentially pan CT scanned in the ED with several findings:  Possible left lower lobe pneumonia on chest CT, Large left groin hernia without obstruction or incarceration, Abnormal findings in the pancreas on CT scan were concerning for mass but not fully evaluated so radiology recommended abdominal MRI to further evaluate, mass on lower pole of right kidney, multiple lung nodules rec CT follow-up in 3 to 6 months.. He was tx with Mucinex , DuoNeb, Solu-Medrol , ceftriaxone , doxycycline . Admitted to hospitalist  MRI of the abdomen showed motion degraded exam, moderate dilatation of main pancreatic duct with and subtle soft tissue prominence suspicious for small pancreatic mass recommending EUS for further consideration, 2.6 cm complex subcapsular lesion of the right kidney considered intermediate to Bosniak category 73F lesion  recommend continue follow-up by renal protocol abdomen CT with and 6 months. 12/3.  Received insurance authorization for rehab.  I ordered labs for today since potassium was high yesterday.  Hemoglobin drifted down to 7.3, potassium 5.  Creatinine 1.72.  Will check hemoglobin again tomorrow with type and cross.  IV fluids stopped. 12/4.  Hemoglobin drifted down to 6.7.  1 unit of packed red blood cells given and hemoglobin up to 9.4.  Patient feels well after transfusion.  No shortness of breath.  Family refused going out to rehab. 12/5.  Unable to reach family today.  Will treat high potassium today. 12/6.  Hemoglobin down to 7.7.  Given IV iron .  Potassium 5.2. 12/7.  Tried to contact family about discharge.  The patient has no money because his wallet is missing.  I advised that we could have gotten medications to the [facility] pharmacy.  Patient would like his medications to go to the [place] [facility] but family concerned that since he does not have an ID he will be able to get them. 12/8.  Patient feeling well.  Hemoglobin 7.9.  Give another dose of IV iron .  Creatinine 1.82 and potassium 4.7.  Lokelma  prescription went into [facility] pharmacy which was delivered.  Other prescription sent to [facility] clinic pharmacy.  Patient able to pick up prescriptions at the [facility] clinic pharmacy without an ID.  Patient discharged. 12/9.  Apparently hospital bed not delivered yesterday.  Spoke with TOC and sister did not schedule a delivery time for today. 12/10: pt stable for DC and WAS sent home w/  EMS but they brought him back when realized hospital bed had never acutally been delivered - see TOC notes  12/11: stable  12/12: stable.      Consultants:  Palliative care  General surgery   Procedures/Surgeries: none      ASSESSMENT & PLAN:   Hyperkalemia Patient received Lokelma  during the hospital course.   Low potassium diet.   Lokelma  daily upon going home and this was prescribed through the [facility] pharmacy.    Anemia likely chronic inflammation Hemoglobin 6.7 --> 1 unit PRBC.   Status post IV iron  x 2 doses.    No signs of bleeding PPI Hold ASA Monitor CBC   COPD exacerbation  Chronic hypoxic resp fail on O2 at home but was out of this initially causing acute on chronic failure  Treated during hospital course Nebs as needed   Acute kidney injury superimposed on CKD Acute kidney injury on CKD stage IIIb. Creatinine peaked at 2.77.   Refer to nephrology as outpatient.  follow BMP.    Hypernatremia Sodium normalized with IV fluids.  Encourage po hydration  Monitor BMP   Pancreatic mass CA 19-9 only a few points higher than the normal range.   outpatient follow-up for endoscopic ultrasound. Of note, Dr Wilhelmenia will not be able to do the EUS outpatient, will work on referral [facility] or [facility] or [facility] or can reach out to Dr Burnette / Dr Rollin in [facility] - message sent to them and if they are unable to help will start process for outside referral.    Kidney lesion, native, right MRI showing 2.6 cm complex subcapsular lesion of the right kidney considered indeterminate  follow-up with renal protocol CT in 6 months.  With chronic kidney disease may have to repeat with an MRI.   Left inguinal hernia Seen by general surgery and not a surgical candidate currently.   Weakness We had rehab set up the other day but family refused and now they will take home with outpatient physical therapy.   Chronic diastolic CHF (congestive heart failure) No signs of congestive heart failure on this hospital course even with giving IV fluids and blood.   Can go back on Farxiga  as outpatient.   No ACE or ARB or spironolactone with acute kidney injury and hyperkalemia.   On low-dose bisoprolol .   Chronic respiratory failure with hypoxia and hypercapnia (HCC) On 3 L of oxygen.   Patient set up with portable tanks.  Already has a concentrator.   History of seizure On Keppra       underweight based on BMI:  Body mass index is 18.44 kg/m.SABRA Significantly low or high BMI is associated with higher medical risk.  Underweight - under 18  overweight - 25 to 29 obese - 30 or more Class 1 obesity: BMI of 30.0 to 34 Class 2 obesity: BMI of 35.0 to 39 Class 3 obesity: BMI of 40.0 to 49 Super Morbid Obesity: BMI 50-59 Super-super Morbid Obesity: BMI 60+ Healthy nutrition and physical activity advised as adjunct to other disease management and risk reduction treatments    DVT prophylaxis: heparin  IV fluids: no continuous IV fluids  Nutrition: remal / low K diet Central lines / other devices: none  Code Status: FULL CODE ACP documentation reviewed: none on file in [facility]  TOC needs: see TOC notes  Medical barriers to dispo: none.            Subjective / Brief ROS:  Patient reports no concerns at this time Denies CP/SOB.  Pain controlled.    Family Communication: none at bedside - see TOC notes, no medical updates today     Objective Findings:  Vitals:   04/19/24 1440 04/19/24 1955 04/20/24 0503 04/20/24 0834  BP: (!) 161/88 (!) 166/90 (!) 152/87 (!) 149/85  Pulse: 65 62 (!) 58 (!) 53  Resp: 19 17 17 18   Temp: 98.5 F (36.9 C) 98.9 F (37.2 C) 98.8 F (37.1 C) 98.6 F (37 C)  TempSrc:      SpO2: 100% 98% 100% (!) 87%  Weight:      Height:        Intake/Output Summary (Last 24 hours) at 04/20/2024 1459 Last data filed at 04/20/2024 0900 Gross per 24 hour  Intake 120 ml  Output 400 ml  Net -280 ml   Filed Weights   04/03/24 0341 04/03/24 2236  Weight: 51.7 kg 53.4 kg    Examination:  Physical Exam Constitutional:      General: He is not in acute distress. Cardiovascular:     Rate and Rhythm: Normal rate and regular rhythm.  Pulmonary:     Effort: Pulmonary effort is normal.     Breath sounds: Normal breath sounds.  Musculoskeletal:     Right lower leg: No edema.     Left lower leg: No edema.  Neurological:     Mental Status: He is alert. Mental status  is at baseline.  Psychiatric:        Mood and Affect: Mood normal.        Behavior: Behavior normal.          Scheduled Medications:   bisoprolol   2.5 mg Oral QHS   guaiFENesin   600 mg Oral BID   heparin  injection (subcutaneous)  5,000 Units Subcutaneous Q8H   influenza vac split trivalent PF  0.5 mL Intramuscular Tomorrow-1000   levETIRAcetam   750 mg Oral BID   pantoprazole   40 mg Oral BID   pneumococcal 20-valent conjugate vaccine  0.5 mL Intramuscular Tomorrow-1000   sodium zirconium cyclosilicate   10 g Oral Daily   traZODone   100 mg Oral QHS   umeclidinium bromide   1 puff Inhalation Daily    Continuous Infusions:   PRN Medications:  acetaminophen  **OR** acetaminophen , albuterol , HYDROcodone -acetaminophen , ipratropium-albuterol , ondansetron  **OR** ondansetron  (ZOFRAN ) IV  Antimicrobials from admission:  Anti-infectives (From admission, onward)    Start     Dose/Rate Route Frequency Ordered Stop   04/05/24 0600  cefTRIAXone  (ROCEPHIN ) 2 g in sodium chloride  0.9 % 100 mL IVPB        2 g 200 mL/hr over 30 Minutes Intravenous Every 24 hours 04/04/24 1030 04/10/24 0355   04/04/24 0600  cefTRIAXone  (ROCEPHIN ) 1 g in sodium chloride  0.9 % 100 mL IVPB  Status:  Discontinued        1 g 200 mL/hr over 30 Minutes Intravenous Every 24 hours 04/03/24 1129 04/04/24 1030   04/03/24 0600  doxycycline  (VIBRAMYCIN ) 100 mg in sodium chloride  0.9 % 250 mL IVPB        100 mg 125 mL/hr over 120 Minutes Intravenous  Once 04/03/24 0513 04/03/24 0819   04/03/24 0515  cefTRIAXone  (ROCEPHIN ) 2 g in sodium chloride  0.9 % 100 mL IVPB        2 g 200 mL/hr over 30 Minutes Intravenous  Once 04/03/24 0513 04/03/24 0600           Data Reviewed:  I have personally reviewed the following...  CBC: Recent Labs  Lab 04/14/24 0449 04/15/24 0721 04/16/24  0507 04/20/24 0620  WBC  --   --   --  8.0  HGB 7.7* 8.1* 7.9* 7.7*  HCT  --   --   --  26.0*  MCV  --   --   --  95.6  PLT  --   --    --  143*   Basic Metabolic Panel: Recent Labs  Lab 04/14/24 0449 04/15/24 0721 04/16/24 0507 04/17/24 0501 04/18/24 0759 04/20/24 0620  NA 139 140 140  --  142 140  K 5.2* 5.1 4.7  --  4.6 4.6  CL 104 104 103  --  106 104  CO2 34* 33* 33*  --  34* 32  GLUCOSE 73 77 72  --  80 79  BUN 28* 28* 27*  --  30* 28*  CREATININE 1.71* 1.78* 1.82* 1.78* 1.92* 1.86*  CALCIUM  8.2* 8.5* 8.5*  --  8.6* 8.9   GFR: Estimated Creatinine Clearance: 30.7 mL/min (A) (by C-G formula based on SCr of 1.86 mg/dL (H)). Liver Function Tests: No results for input(s): AST, ALT, ALKPHOS, BILITOT, PROT, ALBUMIN in the last 168 hours. No results for input(s): LIPASE, AMYLASE in the last 168 hours. No results for input(s): AMMONIA in the last 168 hours. Coagulation Profile: No results for input(s): INR, PROTIME in the last 168 hours. Cardiac Enzymes: No results for input(s): CKTOTAL, CKMB, CKMBINDEX, TROPONINI in the last 168 hours. BNP (last 3 results) Recent Labs    04/03/24 0359  PROBNP 3,058.0*   HbA1C: No results for input(s): HGBA1C in the last 72 hours. CBG: No results for input(s): GLUCAP in the last 168 hours. Lipid Profile: No results for input(s): CHOL, HDL, LDLCALC, TRIG, CHOLHDL, LDLDIRECT in the last 72 hours. Thyroid  Function Tests: No results for input(s): TSH, T4TOTAL, FREET4, T3FREE, THYROIDAB in the last 72 hours. Anemia Panel: No results for input(s): VITAMINB12, FOLATE, FERRITIN, TIBC, IRON , RETICCTPCT in the last 72 hours. Most Recent Urinalysis On File:     Component Value Date/Time   COLORURINE YELLOW (A) 04/03/2024 0814   APPEARANCEUR HAZY (A) 04/03/2024 0814   APPEARANCEUR Clear 11/02/2013 0205   LABSPEC 1.013 04/03/2024 0814   LABSPEC 1.005 11/02/2013 0205   PHURINE 5.0 04/03/2024 0814   GLUCOSEU 50 (A) 04/03/2024 0814   GLUCOSEU Negative 11/02/2013 0205   HGBUR NEGATIVE 04/03/2024 0814    BILIRUBINUR NEGATIVE 04/03/2024 0814   BILIRUBINUR Negative 11/02/2013 0205   KETONESUR NEGATIVE 04/03/2024 0814   PROTEINUR 100 (A) 04/03/2024 0814   NITRITE NEGATIVE 04/03/2024 0814   LEUKOCYTESUR NEGATIVE 04/03/2024 0814   LEUKOCYTESUR Negative 11/02/2013 0205   Sepsis Labs: @LABRCNTIP (procalcitonin:4,lacticidven:4) Microbiology: No results found for this or any previous visit (from the past 240 hours).    Radiology Studies last 3 days: No results found.     Time spent: 50 min     Altus Zaino, DO [facility] 04/20/2024, 2:59 PM    Dictation software may have been used to generate the above note. Typos may occur and escape review in typed/dictated notes. Please contact Dr Marsa directly for clarity if needed.  Staff may message me via secure chat in Epic  but this may not receive an immediate response,  please page me for urgent matters!  If 7PM-7AM, please contact night coverage [email or web address]

## 2024-04-20 NOTE — Plan of Care (Signed)

## 2024-04-20 NOTE — Progress Notes (Signed)
 Mobility Specialist - Progress Note   04/20/24 1443  Mobility  Activity Ambulated with assistance;Dangled on edge of bed;Stood at bedside  Level of Assistance Contact guard assist, steadying assist  Assistive Device Front wheel walker  Distance Ambulated (ft) 10 ft  Activity Response Tolerated well  Mobility visit 1 Mobility  Mobility Specialist Start Time (ACUTE ONLY) 1416  Mobility Specialist Stop Time (ACUTE ONLY) 1437  Mobility Specialist Time Calculation (min) (ACUTE ONLY) 21 min   Pt semi fowler upon entry, utilizing [place]. Pt completed bed mob ModI, requiring Mod-MinA to don  clothing--- stood at bedside for ~2 mins. Pt amb around the bed and returned to fowler position with alarm set and needs within reach. RN notified.  America Silvan Mobility Specialist 04/20/2024 2:46 PM

## 2024-04-20 NOTE — TOC Transition Note (Signed)
 Transition of Care [facility]) - Discharge Note   Patient Details  Name: Darin Hopkins MRN: [id number] Date of Birth: 20-Jun-1960  Transition of Care [facility]) CM/SW Contact:  Alvaro Louder, LCSW Phone Number: 04/20/2024, 1:32 PM   Clinical Narrative:   LCSWA spoke to [facility] and she indicated that her house has been treated for bed bugs at 10:00 AM today. Due to treatment today [facility] said that they will not be able to deliver the bed until Monday per their protocols with bed bugs.   LCSWA reached out to [employer] for delivery of the bed and they indicated that they can deliver the bed today at 6:00 PM. LCSWA confirmed bed delivery with [facility]. She was agreeable. Transport arranged with [facility] for next available.  TOC signing off    Final next level of care: Skilled Nursing Facility Barriers to Discharge: No Barriers Identified   Patient Goals and CMS Choice Patient states their goals for this hospitalization and ongoing recovery are:: Just go back home          Discharge Placement              Patient chooses bed at:  [facility]) Patient to be transferred to facility by: [facility] Name of family member notified: [name] Patient and family notified of of transfer: 04/12/24  Discharge Plan and Services Additional resources added to the After Visit Summary for                                       Social Drivers of Health (SDOH) Interventions SDOH Screenings   Food Insecurity: No Food Insecurity (04/03/2024)  Housing: Low Risk (04/03/2024)  Transportation Needs: No Transportation Needs (04/03/2024)  Utilities: Not At Risk (04/03/2024)  Depression (PHQ2-9): Low Risk (02/11/2022)  Tobacco Use: Medium Risk (04/03/2024)     Readmission Risk Interventions     No data to display

## 2024-04-21 DIAGNOSIS — E875 Hyperkalemia: Secondary | ICD-10-CM | POA: Diagnosis not present

## 2024-04-21 MED ORDER — HYDRALAZINE HCL 10 MG PO TABS
10.0000 mg | ORAL_TABLET | Freq: Three times a day (TID) | ORAL | Status: DC
  Filled 2024-04-21: qty 1

## 2024-04-21 NOTE — TOC Transition Note (Signed)
 Transition of Care [facility]) - Discharge Note   Patient Details  Name: Darin Hopkins MRN: [id number] Date of Birth: 05/21/1960  Transition of Care [facility]) CM/SW Contact:  Rylan Bernard L Zohra Clavel, LCSW Phone Number: 04/21/2024, 8:44 AM   Clinical Narrative:     [facility] contacted [phone]) to scheduled transportation home for patient. CSW requested wheelchair transportation for patient. Reference number# [id number]. Pick up will be anywhere between now and 11:45am.     Final next level of care: Skilled Nursing Facility Barriers to Discharge: No Barriers Identified   Patient Goals and CMS Choice Patient states their goals for this hospitalization and ongoing recovery are:: Just go back home          Discharge Placement              Patient chooses bed at:  [facility]) Patient to be transferred to facility by: [facility] Name of family member notified: [name] Patient and family notified of of transfer: 04/20/24  Discharge Plan and Services Additional resources added to the After Visit Summary for                  DME Arranged: Hospital bed DME Agency: [employer] Date DME Agency Contacted: 04/20/24   Representative spoke with at DME Agency: [place]            Social Drivers of Health (SDOH) Interventions SDOH Screenings   Food Insecurity: No Food Insecurity (04/03/2024)  Housing: Low Risk (04/03/2024)  Transportation Needs: No Transportation Needs (04/03/2024)  Utilities: Not At Risk (04/03/2024)  Depression (PHQ2-9): Low Risk (02/11/2022)  Tobacco Use: Medium Risk (04/03/2024)     Readmission Risk Interventions     No data to display

## 2024-04-21 NOTE — Plan of Care (Signed)
  Problem: Education: Goal: Knowledge of General Education information will improve Description: Including pain rating scale, medication(s)/side effects and non-pharmacologic comfort measures Outcome: Progressing   Problem: Health Behavior/Discharge Planning: Goal: Ability to manage health-related needs will improve Outcome: Progressing   Problem: Clinical Measurements: Goal: Respiratory complications will improve Outcome: Progressing   

## 2024-04-21 NOTE — Discharge Summary (Signed)
 Physician Discharge Summary   Patient: Darin Hopkins MRN: [id number]  DOB: [birth year]   Admit:     Date of Admission: 04/03/2024 Admitted from: home   Discharge: Date of discharge: 04/21/2024 Disposition: Home health Condition at discharge: good  CODE STATUS: FULL     Discharge Physician: Laneta Blunt, DO [facility]     PCP: [name], NP  Recommendations for Outpatient Follow-up:  Follow up with PCP [name], NP in 1-2 weeks Follow w GI as noted below    Discharge Instructions     Amb Referral to Intravenous Iron  Therapy   Complete by: As directed    You have been referred to [facility] Infusion team for IV Iron  Infusions. The infusion pharmacy team will reach out to you with appointment information.    Primary Diagnosis Code for IV Iron : D50.9 - Iron  deficiency Anemia   Secondary diagnosis code for IV iron : N18.x - CKD   Ambulatory referral to Gastroenterology   Complete by: As directed    What is the reason for referral?: Other Comment - EUS   Ambulatory referral to Physical Therapy   Complete by: As directed          Discharge Diagnoses: Principal Problem:   Hyperkalemia Active Problems:   Drop in hemoglobin   COPD exacerbation (HCC)   Acute kidney injury superimposed on CKD   Hypernatremia   Pancreatic mass   Kidney lesion, native, right   Left inguinal hernia   History of seizure   Chronic respiratory failure with hypoxia and hypercapnia (HCC)   Chronic diastolic CHF (congestive heart failure) (HCC)   Weakness      Hospital course / significant events:   HPI: 63 y.o. male with a PMH significant for COPD, HTN, left inguinal hernia, GERD, CHF, history of seizure, HLD, alcohol dependence, protein calorie malnutrition, CKD 3. He presented from home to the ED on 04/03/2024 with concerns for weakness x 2 days.  Per EMS report, patient's O2 saturations on their initial evaluation were in the 50s which appears to be  due to the fact that his oxygen tank was empty and chronically is supposed to be on 2 to 3 L.  They also reported significant bedbugs in his home.  He was put on home oxygen with significant improvement.  11/25: to ED. Stable vital signs on 2 L nasal cannula.  It appears that patient was put on BiPAP at some point earlier this morning but there is no documented desaturation or reason for this and when hospitalist evaluated patient he was stable and comfortable so transitioned back to his home nasal cannula oxygen. Other:  Essentially pan CT scanned in the ED with several findings:  Possible left lower lobe pneumonia on chest CT, Large left groin hernia without obstruction or incarceration, Abnormal findings in the pancreas on CT scan were concerning for mass but not fully evaluated so radiology recommended abdominal MRI to further evaluate, mass on lower pole of right kidney, multiple lung nodules rec CT follow-up in 3 to 6 months.. He was tx with Mucinex , DuoNeb, Solu-Medrol , ceftriaxone , doxycycline . Admitted to hospitalist  MRI of the abdomen showed motion degraded exam, moderate dilatation of main pancreatic duct with and subtle soft tissue prominence suspicious for small pancreatic mass recommending EUS for further consideration, 2.6 cm complex subcapsular lesion of the right kidney considered intermediate to Bosniak category 72F lesion recommend continue follow-up by renal protocol abdomen CT with and 6 months. 12/3.  Received insurance  authorization for rehab.  I ordered labs for today since potassium was high yesterday.  Hemoglobin drifted down to 7.3, potassium 5.  Creatinine 1.72.  Will check hemoglobin again tomorrow with type and cross.  IV fluids stopped. 12/4.  Hemoglobin drifted down to 6.7.  1 unit of packed red blood cells given and hemoglobin up to 9.4.  Patient feels well after transfusion.  No shortness of breath.  Family refused going out to rehab. 12/5.  Unable to reach family today.  Will  treat high potassium today. 12/6.  Hemoglobin down to 7.7.  Given IV iron .  Potassium 5.2. 12/7.  Tried to contact family about discharge.  The patient has no money because his wallet is missing.  I advised that we could have gotten medications to the [facility] pharmacy.  Patient would like his medications to go to the [place] clinic but family concerned that since he does not have an ID he will be able to get them. 12/8.  Patient feeling well.  Hemoglobin 7.9.  Give another dose of IV iron .  Creatinine 1.82 and potassium 4.7.  Lokelma  prescription went into [facility] pharmacy which was delivered.  Other prescription sent to [facility] clinic pharmacy.  Patient able to pick up prescriptions at the [facility] clinic pharmacy without an ID.  Patient discharged. 12/9.  Apparently hospital bed not delivered yesterday.  Spoke with TOC and sister did not schedule a delivery time for today. 12/10: pt stable for DC and WAS sent home w/ EMS but they brought him back when realized hospital bed had never acutally been delivered - see TOC notes  12/11: stable  12/12: stable. TOC confirms dc for tm 12/13: stable. TOC confirmd dc for today      Consultants:  Palliative care  General surgery   Procedures/Surgeries: none      ASSESSMENT & PLAN:   Hyperkalemia Patient received Lokelma  during the hospital course.   Low potassium diet.   Lokelma  daily upon going home and this was prescribed through the [facility] pharmacy.   Anemia likely chronic inflammation Hemoglobin 6.7 --> 1 unit PRBC.   Status post IV iron  x 2 doses.    No signs of bleeding PPI Hold ASA Monitor CBC   COPD exacerbation  Chronic hypoxic resp fail on O2 at home but was out of this initially causing acute on chronic failure  Treated during hospital course Nebs as needed   Acute kidney injury superimposed on CKD Acute kidney injury on CKD stage IIIb. Creatinine peaked at 2.77.   Refer to nephrology as outpatient.  follow BMP outpatient     Hypernatremia Sodium normalized with IV fluids.  Encourage po hydration  Monitor BMP outpatient    Pancreatic mass CA 19-9 only a few points higher than the normal range.   outpatient follow-up for endoscopic ultrasound. Of note, Dr Wilhelmenia will not be able to do the EUS outpatient, confirmed w/ Dr Rollin in [facility] pt can be seen there see info in dc summary  / referral order placed  message sent to them and if they are unable to help will start process for outside referral.    Kidney lesion, native, right MRI showing 2.6 cm complex subcapsular lesion of the right kidney considered indeterminate  follow-up with renal protocol CT in 6 months.  With chronic kidney disease may have to repeat with an MRI.   Left inguinal hernia Seen by general surgery and not a surgical candidate currently.   Weakness We had rehab set up the  other day but family refused and now they will take home with outpatient physical therapy.   Chronic diastolic CHF (congestive heart failure) Essential HTN No signs of congestive heart failure on this hospital course even with giving IV fluids and blood.   Can go back on Farxiga  as outpatient.   No ACE or ARB or spironolactone with acute kidney injury and hyperkalemia.   On low-dose bisoprolol  Holding lasix  Consider restart  hydralazine  lower dose but BP has been labile, will hold for now    Chronic respiratory failure with hypoxia and hypercapnia (HCC) On 3 L of oxygen.   Patient set up with portable tanks.  Already has a concentrator.   History of seizure On Keppra       underweight based on BMI: Body mass index is 18.44 kg/m.SABRA Significantly low or high BMI is associated with higher medical risk.  Underweight - under 18  overweight - 25 to 29 obese - 30 or more Class 1 obesity: BMI of 30.0 to 34 Class 2 obesity: BMI of 35.0 to 39 Class 3 obesity: BMI of 40.0 to 49 Super Morbid Obesity: BMI 50-59 Super-super Morbid Obesity: BMI 60+ Healthy nutrition  and physical activity advised as adjunct to other disease management and risk reduction treatments       Discharge Instructions  Allergies as of 04/21/2024   No Known Allergies      Medication List     STOP taking these medications    amLODipine  10 MG tablet Commonly known as: NORVASC    aspirin  EC 81 MG tablet   furosemide  20 MG tablet Commonly known as: LASIX    hydrALAZINE  50 MG tablet Commonly known as: APRESOLINE    potassium chloride  10 MEQ tablet Commonly known as: KLOR-CON  M       TAKE these medications    acetaminophen  325 MG tablet Commonly known as: TYLENOL  Take 2 tablets (650 mg total) by mouth every 6 (six) hours as needed for mild pain (pain score 1-3) or fever (or Fever >/= 101).   atorvastatin  20 MG tablet Commonly known as: LIPITOR Take 1 tablet (20 mg total) by mouth every evening.   Bisoprolol  Fumarate 2.5 MG Tabs Take 2.5 mg by mouth at bedtime. What changed:  medication strength how much to take how to take this when to take this additional instructions   cholecalciferol 25 MCG (1000 UNIT) tablet Commonly known as: VITAMIN D3 Take 1 tablet (1,000 Units total) by mouth daily.   Farxiga  10 MG Tabs tablet Generic drug: dapagliflozin  propanediol Take 1 tablet (10 mg total) by mouth daily before breakfast.   FeroSul 325 (65 Fe) MG tablet Generic drug: ferrous sulfate Take 1 tablet (325 mg total) by mouth daily with breakfast.   folic acid  1 MG tablet Commonly known as: FOLVITE  Take 1 tablet (1 mg total) by mouth daily.   levETIRAcetam  750 MG tablet Commonly known as: KEPPRA  Take 1 tablet (750 mg total) by mouth 2 (two) times daily.   Lokelma  10 g Pack packet Generic drug: sodium zirconium cyclosilicate  Take 10 g by mouth daily.   Multivitamin Adult Tabs Take 2 tablets by mouth daily.   omeprazole  20 MG capsule Commonly known as: PRILOSEC Take 1 capsule (20 mg total) by mouth daily before breakfast.   ProAir  HFA 108 (90  Base) MCG/ACT inhaler Generic drug: albuterol  Inhale 2 puffs into the lungs every 6 (six) hours as needed for shortness of breath or wheezing.   Stiolto Respimat  2.5-2.5 MCG/ACT Aers Generic drug: Tiotropium Bromide -Olodaterol Inhale  2 puffs into the lungs daily. Pt needs to make an appointment to continue to receive refills please.   thiamine  100 MG tablet Commonly known as: VITAMIN B1 Take 1 tablet (100 mg total) by mouth daily.   traZODone  50 MG tablet Commonly known as: DESYREL  Take 1 tablet (50 mg total) by mouth at bedtime.               Durable Medical Equipment  (From admission, onward)           Start     Ordered   04/14/24 1328  For home use only DME oxygen  Once       Question Answer Comment  Length of Need Lifetime   Mode or (Route) Nasal cannula   Liters per Minute 3   Frequency Continuous (stationary and portable oxygen unit needed)   Oxygen conserving device Yes   Oxygen delivery system: Gas      04/14/24 1328   04/14/24 1327  For home use only DME Hospital bed  Once       Question Answer Comment  Length of Need Lifetime   The above medical condition requires: Patient requires the ability to reposition frequently   Head must be elevated greater than: 30 degrees   Bed type Semi-electric   Support Surface: Gel Overlay      04/14/24 1326             Contact information for follow-up providers     [name], NP Follow up in 5 day(s).   Specialty: Nurse Practitioner Contact information: [address] [place] [address] [phone]         Douglas Rule, MD Follow up in 2 week(s).   Specialty: Nephrology Why: CKD after hospital referral Contact information: [address] [employer] [address] [place] [address] [phone]         [facility]. Go on 04/25/2024.   Specialty: Cardiology Why: Hosptial Follow-Up 04/25/2024 @ 9:00 AM Please bring all medications to  follow-up appointment [facility] [address] Floor Free Valet Parking at the door Contact information: [address] [place]  [address] [phone]        Rollin Dover, MD. Call.   Specialty: Gastroenterology Why: referral has been sent to follow up on ultrasound procedure - fmaily/patient please call this office to confirm appointment Contact information: [address] [place] [address] [phone]              Contact information for after-discharge care     Destination     [facility] .   Service: Skilled Nursing Contact information: [address] [place]  [address] [phone]                     Allergies[1]   Subjective: pt reports feeling well this morning, no concerns, no CP/SOB, tolerating diet    Discharge Exam: BP 129/72   Pulse (!) 50   Temp 98.4 F (36.9 C)   Resp 19   Ht 5' 7 (1.702 m)   Wt 53.4 kg   SpO2 100%   BMI 18.44 kg/m  General: Pt is alert, awake, not in acute distress Cardiovascular: RRR, S1/S2 +, no rubs, no gallops Respiratory: CTA bilaterally, no wheezing, no rhonchi Abdominal: Soft, NT, ND, bowel sounds + Extremities: no edema, no cyanosis     The results of significant diagnostics from this  hospitalization (including imaging, microbiology, ancillary and laboratory) are listed below for reference.     Microbiology: No results found for this or any previous visit (from the past 240 hours).   Labs: BNP (last 3 results) Recent Labs    02/01/24 1035  BNP 196.8*   Basic Metabolic Panel: Recent Labs  Lab 04/15/24 0721 04/16/24 0507 04/17/24 0501 04/18/24 0759 04/20/24 0620  NA 140 140  --  142 140  K 5.1 4.7  --  4.6 4.6  CL 104 103  --  106 104  CO2 33* 33*  --  34* 32  GLUCOSE 77 72  --  80 79  BUN 28* 27*  --  30* 28*  CREATININE 1.78* 1.82* 1.78* 1.92* 1.86*  CALCIUM  8.5* 8.5*  --   8.6* 8.9   Liver Function Tests: No results for input(s): AST, ALT, ALKPHOS, BILITOT, PROT, ALBUMIN in the last 168 hours. No results for input(s): LIPASE, AMYLASE in the last 168 hours. No results for input(s): AMMONIA in the last 168 hours. CBC: Recent Labs  Lab 04/15/24 0721 04/16/24 0507 04/20/24 0620  WBC  --   --  8.0  HGB 8.1* 7.9* 7.7*  HCT  --   --  26.0*  MCV  --   --  95.6  PLT  --   --  143*   Cardiac Enzymes: No results for input(s): CKTOTAL, CKMB, CKMBINDEX, TROPONINI in the last 168 hours. BNP: Invalid input(s): POCBNP CBG: No results for input(s): GLUCAP in the last 168 hours. D-Dimer No results for input(s): DDIMER in the last 72 hours. Hgb A1c No results for input(s): HGBA1C in the last 72 hours. Lipid Profile No results for input(s): CHOL, HDL, LDLCALC, TRIG, CHOLHDL, LDLDIRECT in the last 72 hours. Thyroid  function studies No results for input(s): TSH, T4TOTAL, T3FREE, THYROIDAB in the last 72 hours.  Invalid input(s): FREET3 Anemia work up No results for input(s): VITAMINB12, FOLATE, FERRITIN, TIBC, IRON , RETICCTPCT in the last 72 hours. Urinalysis    Component Value Date/Time   COLORURINE YELLOW (A) 04/03/2024 0814   APPEARANCEUR HAZY (A) 04/03/2024 0814   APPEARANCEUR Clear 11/02/2013 0205   LABSPEC 1.013 04/03/2024 0814   LABSPEC 1.005 11/02/2013 0205   PHURINE 5.0 04/03/2024 0814   GLUCOSEU 50 (A) 04/03/2024 0814   GLUCOSEU Negative 11/02/2013 0205   HGBUR NEGATIVE 04/03/2024 0814   BILIRUBINUR NEGATIVE 04/03/2024 0814   BILIRUBINUR Negative 11/02/2013 0205   KETONESUR NEGATIVE 04/03/2024 0814   PROTEINUR 100 (A) 04/03/2024 0814   NITRITE NEGATIVE 04/03/2024 0814   LEUKOCYTESUR NEGATIVE 04/03/2024 0814   LEUKOCYTESUR Negative 11/02/2013 0205   Sepsis Labs Recent Labs  Lab 04/20/24 0620  WBC 8.0   Microbiology No results found for this or any previous visit (from  the past 240 hours). Imaging MR ABDOMEN W WO CONTRAST Result Date: 04/04/2024 CLINICAL DATA:  Pancreatic ductal dilatation and possible pancreatic mass. Indeterminate right renal lesion. EXAM: MRI ABDOMEN WITHOUT AND WITH CONTRAST TECHNIQUE: Multiplanar multisequence MR imaging of the abdomen was performed both before and after the administration of intravenous contrast. CONTRAST:  5mL GADAVIST  GADOBUTROL  1 MMOL/ML IV SOLN COMPARISON:  CT on 04/03/2024 FINDINGS: Lower chest: Left lower lobe airspace disease, suspicious for pneumonia. Hepatobiliary: Image degradation by motion artifact noted. No hepatic masses identified. Gallbladder is unremarkable. No evidence of biliary ductal dilatation. Pancreas: Image degradation by motion artifact noted. Moderate dilatation of the main pancreatic duct is seen in the body and tail. Stricture of the pancreatic duct is  seen within the body, with subtle soft tissue prominence measuring 1.9 x 1.1 cm, best visualized on image 43/19. This is suspicious for a small pancreatic mass. Spleen:  Within normal limits in size and appearance. Adrenals/Urinary Tract: Image degradation by motion artifact noted, particularly the dynamic postcontrast sequences. Tiny benign-appearing Bosniak category 1 and 2 renal cysts are seen bilaterally. A complex subcapsular lesion is seen in the lateral lower pole of the right kidney which shows heterogeneous T1 hyperintensity and T2 hypointensity, and measures 2.6 x 2.5 cm. Although limited, no definite contrast enhancement of this lesion is seen on subtraction imaging. This is likely a complex hemorrhagic cystic lesion, and considered a Bosniak category 2 F lesion. No evidence of hydronephrosis. Stomach/Bowel: Unremarkable. Vascular/Lymphatic: No pathologically enlarged lymph nodes identified. No acute vascular findings. Other:  None. Musculoskeletal:  No suspicious bone lesions identified. IMPRESSION: Motion degraded exam. Moderate dilatation of the  main pancreatic duct, with stricture in the pancreatic body and subtle soft tissue prominence suspicious for a small pancreatic mass. Recommend GI consultation for consideration of EUS/FNA. 2.6 cm complex subcapsular lesion in the right kidney is considered an indeterminate Bosniak category 2 F lesion. Recommend continued follow-up by renal protocol abdomen CT without and with contrast in 6 months. Left lower lobe airspace disease, suspicious for pneumonia. Electronically Signed   By: Norleen DELENA Kil M.D.   On: 04/04/2024 08:46   MR 3D Recon At Scanner Result Date: 04/04/2024 CLINICAL DATA:  Pancreatic ductal dilatation and possible pancreatic mass. Indeterminate right renal lesion. EXAM: MRI ABDOMEN WITHOUT AND WITH CONTRAST TECHNIQUE: Multiplanar multisequence MR imaging of the abdomen was performed both before and after the administration of intravenous contrast. CONTRAST:  5mL GADAVIST  GADOBUTROL  1 MMOL/ML IV SOLN COMPARISON:  CT on 04/03/2024 FINDINGS: Lower chest: Left lower lobe airspace disease, suspicious for pneumonia. Hepatobiliary: Image degradation by motion artifact noted. No hepatic masses identified. Gallbladder is unremarkable. No evidence of biliary ductal dilatation. Pancreas: Image degradation by motion artifact noted. Moderate dilatation of the main pancreatic duct is seen in the body and tail. Stricture of the pancreatic duct is seen within the body, with subtle soft tissue prominence measuring 1.9 x 1.1 cm, best visualized on image 43/19. This is suspicious for a small pancreatic mass. Spleen:  Within normal limits in size and appearance. Adrenals/Urinary Tract: Image degradation by motion artifact noted, particularly the dynamic postcontrast sequences. Tiny benign-appearing Bosniak category 1 and 2 renal cysts are seen bilaterally. A complex subcapsular lesion is seen in the lateral lower pole of the right kidney which shows heterogeneous T1 hyperintensity and T2 hypointensity, and measures  2.6 x 2.5 cm. Although limited, no definite contrast enhancement of this lesion is seen on subtraction imaging. This is likely a complex hemorrhagic cystic lesion, and considered a Bosniak category 2 F lesion. No evidence of hydronephrosis. Stomach/Bowel: Unremarkable. Vascular/Lymphatic: No pathologically enlarged lymph nodes identified. No acute vascular findings. Other:  None. Musculoskeletal:  No suspicious bone lesions identified. IMPRESSION: Motion degraded exam. Moderate dilatation of the main pancreatic duct, with stricture in the pancreatic body and subtle soft tissue prominence suspicious for a small pancreatic mass. Recommend GI consultation for consideration of EUS/FNA. 2.6 cm complex subcapsular lesion in the right kidney is considered an indeterminate Bosniak category 2 F lesion. Recommend continued follow-up by renal protocol abdomen CT without and with contrast in 6 months. Left lower lobe airspace disease, suspicious for pneumonia. Electronically Signed   By: Norleen DELENA Kil M.D.   On: 04/04/2024 08:46  CT HEAD WO CONTRAST ( ) Result Date: 04/03/2024 EXAM: CT HEAD AND CERVICAL SPINE 04/03/2024 04:32:45 AM TECHNIQUE: CT of the head and cervical spine was performed without the administration of intravenous contrast. Multiplanar reformatted images are provided for review. Automated exposure control, iterative reconstruction, and/or weight based adjustment of the mA/kV was utilized to reduce the radiation dose to as low as reasonably achievable. COMPARISON: Head CT 09/09/2020. No prior cervical spine imaging. CLINICAL HISTORY: Head trauma, abnormal mental status (Age 18-64y) FINDINGS: CT HEAD BRAIN AND VENTRICLES: No acute intracranial hemorrhage. No mass effect or midline shift. No abnormal extra-axial fluid collection. There is encephalomalacia again noted due to a chronic right MCA infarct, centered in the right gangliocapsular area with extension into the posterior right frontal and anterior  right parietal lobes and insula. Ex vacuo prominence of the right lateral ventricle is again noted as well. There is atrophy with atrophic ventriculomegaly and moderate to severe small vessel disease, which is advanced for age. There is a chronic lacunar infarct in the left basal ganglia. Asymmetric atrophy in the left cerebellar hemisphere. No cortical-based acute infarct. No hydrocephalus. There are patchy calcifications in both siphons. There are no hyperdense vessels. ORBITS: No acute abnormality. SINUSES AND MASTOIDS: There is chronic fluid in the right mastoid tip. There is increased left maxillary sinus membrane thickening with trace intracavitary fluid. The other paranasal sinuses and left mastoid air cells are clear. SOFT TISSUES AND SKULL: No acute skull fracture. No acute soft tissue abnormality. CT CERVICAL SPINE LIMITATIONS: Imaging of the cervical spine is limited due to patient motion. BONES AND ALIGNMENT: There is a mild cervical kypholevoscoliosis, centered at C3-4. There is a 2 to 3 mm grade 1 retrolisthesis at C3-C4 and C5-C6, both believed to be related to degenerative disc disease. No traumatic malalignment is seen. No widening of the anterior atlantoaxial joint. No acute cervical fracture is seen through the motion artifacts. DEGENERATIVE CHANGES: There is disc collapse with a chronic appearance and mild endplate irregularities at C3-4 and C5-6, mild disc space loss at C4-C5 and moderate disc space loss at C6-C7. The disc spaces at C2-3, C7-T1 are normal in height. There is mild facet joint hypertrophy at the upper cervical levels, with mild left foraminal stenosis at C3-4 and C4-5. All other foramina are clear. There are anterior and posterior osteophytes from C3-4 through C6-7. There are posterior osteophytes encroaching on the ventral cord surface at C3-4, C4-5 and C5-6, with mild spinal stenosis at these levels. Other levels do not show significant encroachment on the thecal sac allowing  for motion artifact. VASCULATURE: Both Proximal cervical ICAs are heavily calcified. SOFT TISSUES: There are stones in both palatine tonsils. No laryngeal mass is seen. No prevertebral soft tissue swelling. IMPRESSION: 1. No acute intracranial CT findings or depressed skull fractures. 2. Encephalomalacia due to a chronic right MCA infarct with ex vacuo prominence of the right lateral ventricle. 3. Atrophy with atrophic ventriculomegaly and moderate to severe small vessel disease, advanced for age. 4. Chronic lacunar infarct in the left basal ganglia. 5. Asymmetric atrophy in the left cerebellar hemisphere. 6. Mild cervical kypholevoscoliosis centered at C3-4. 7. 2 to 3 mm grade 1 retrolisthesis at C3-C4 and C5-C6, likely related to degenerative disc disease. 8. Disc collapse with a chronic appearance and mild endplate irregularities at C3-4 and C5-6, mild disc space loss at C4-C5, and moderate disc space loss at C6-C7. Spondylosis. 9. Mild left foraminal stenosis at C3-4 and C4-5. All other foramina are clear. 10. Posterior  osteophytes encroaching on the ventral cord surface at C3-4, C4-5, and C5-6, with mild spinal stenosis at these levels. Other levels do not show significant encroachment on the thecal sac, allowing for motion artifact. Electronically signed by: Francis Quam MD 04/03/2024 05:09 AM EST RP Workstation: [id number]   CT Cervical Spine Wo Contrast Result Date: 04/03/2024 EXAM: CT HEAD AND CERVICAL SPINE 04/03/2024 04:32:45 AM TECHNIQUE: CT of the head and cervical spine was performed without the administration of intravenous contrast. Multiplanar reformatted images are provided for review. Automated exposure control, iterative reconstruction, and/or weight based adjustment of the mA/kV was utilized to reduce the radiation dose to as low as reasonably achievable. COMPARISON: Head CT 09/09/2020. No prior cervical spine imaging. CLINICAL HISTORY: Head trauma, abnormal mental status (Age 63-64y)  FINDINGS: CT HEAD BRAIN AND VENTRICLES: No acute intracranial hemorrhage. No mass effect or midline shift. No abnormal extra-axial fluid collection. There is encephalomalacia again noted due to a chronic right MCA infarct, centered in the right gangliocapsular area with extension into the posterior right frontal and anterior right parietal lobes and insula. Ex vacuo prominence of the right lateral ventricle is again noted as well. There is atrophy with atrophic ventriculomegaly and moderate to severe small vessel disease, which is advanced for age. There is a chronic lacunar infarct in the left basal ganglia. Asymmetric atrophy in the left cerebellar hemisphere. No cortical-based acute infarct. No hydrocephalus. There are patchy calcifications in both siphons. There are no hyperdense vessels. ORBITS: No acute abnormality. SINUSES AND MASTOIDS: There is chronic fluid in the right mastoid tip. There is increased left maxillary sinus membrane thickening with trace intracavitary fluid. The other paranasal sinuses and left mastoid air cells are clear. SOFT TISSUES AND SKULL: No acute skull fracture. No acute soft tissue abnormality. CT CERVICAL SPINE LIMITATIONS: Imaging of the cervical spine is limited due to patient motion. BONES AND ALIGNMENT: There is a mild cervical kypholevoscoliosis, centered at C3-4. There is a 2 to 3 mm grade 1 retrolisthesis at C3-C4 and C5-C6, both believed to be related to degenerative disc disease. No traumatic malalignment is seen. No widening of the anterior atlantoaxial joint. No acute cervical fracture is seen through the motion artifacts. DEGENERATIVE CHANGES: There is disc collapse with a chronic appearance and mild endplate irregularities at C3-4 and C5-6, mild disc space loss at C4-C5 and moderate disc space loss at C6-C7. The disc spaces at C2-3, C7-T1 are normal in height. There is mild facet joint hypertrophy at the upper cervical levels, with mild left foraminal stenosis at C3-4  and C4-5. All other foramina are clear. There are anterior and posterior osteophytes from C3-4 through C6-7. There are posterior osteophytes encroaching on the ventral cord surface at C3-4, C4-5 and C5-6, with mild spinal stenosis at these levels. Other levels do not show significant encroachment on the thecal sac allowing for motion artifact. VASCULATURE: Both Proximal cervical ICAs are heavily calcified. SOFT TISSUES: There are stones in both palatine tonsils. No laryngeal mass is seen. No prevertebral soft tissue swelling. IMPRESSION: 1. No acute intracranial CT findings or depressed skull fractures. 2. Encephalomalacia due to a chronic right MCA infarct with ex vacuo prominence of the right lateral ventricle. 3. Atrophy with atrophic ventriculomegaly and moderate to severe small vessel disease, advanced for age. 4. Chronic lacunar infarct in the left basal ganglia. 5. Asymmetric atrophy in the left cerebellar hemisphere. 6. Mild cervical kypholevoscoliosis centered at C3-4. 7. 2 to 3 mm grade 1 retrolisthesis at C3-C4 and C5-C6, likely related  to degenerative disc disease. 8. Disc collapse with a chronic appearance and mild endplate irregularities at C3-4 and C5-6, mild disc space loss at C4-C5, and moderate disc space loss at C6-C7. Spondylosis. 9. Mild left foraminal stenosis at C3-4 and C4-5. All other foramina are clear. 10. Posterior osteophytes encroaching on the ventral cord surface at C3-4, C4-5, and C5-6, with mild spinal stenosis at these levels. Other levels do not show significant encroachment on the thecal sac, allowing for motion artifact. Electronically signed by: Francis Quam MD 04/03/2024 05:09 AM EST RP Workstation: [id number]   CT CHEST ABDOMEN PELVIS WO CONTRAST Result Date: 04/03/2024 CLINICAL DATA:  Increasing weakness. EXAM: CT CHEST, ABDOMEN AND PELVIS WITHOUT CONTRAST TECHNIQUE: Multidetector CT imaging of the chest, abdomen and pelvis was performed following the standard protocol  without IV contrast. RADIATION DOSE REDUCTION: This exam was performed according to the departmental dose-optimization program which includes automated exposure control, adjustment of the mA and/or kV according to patient size and/or use of iterative reconstruction technique. COMPARISON:  Chest CT 08/20/2020 FINDINGS: CT CHEST FINDINGS Cardiovascular: Heart size upper normal. No substantial pericardial effusion. Coronary artery calcification is evident. Mild atherosclerotic calcification is noted in the wall of the thoracic aorta. Ascending thoracic aorta measures up to 4.2 cm diameter. Mediastinum/Nodes: No mediastinal lymphadenopathy. No evidence for gross hilar lymphadenopathy although assessment is limited by the lack of intravenous contrast on the current study. The esophagus has normal imaging features. Axillary lymphadenopathy noted bilaterally. Left axillary index lymph node measures 17 mm short axis on 20/2. Lungs/Pleura: Centrilobular and paraseptal emphysema evident. 8 mm right middle lobe nodule on 134/4. 6 mm right lower lobe nodule on 132/4. Consolidative airspace disease in the left lower lobe. No substantial pleural effusion. Musculoskeletal: No worrisome lytic or sclerotic osseous abnormality. CT ABDOMEN PELVIS FINDINGS Hepatobiliary: No suspicious focal abnormality in the liver on this study without intravenous contrast. Gallbladder not well seen likely due to nondistention. No intra or extrahepatic biliary duct dilatation. Pancreas: There is marked diffuse dilatation of the main pancreatic duct measuring up to 7 mm diameter in the pancreatic tail region. Pancreatic duct becomes obscured near the junction of the body and tail (image 81/2) and again appears distended through the pancreatic head region. Imaging of the pancreas is limited by lack of intravenous contrast material. Spleen: No splenomegaly. No suspicious focal mass lesion. Adrenals/Urinary Tract: No adrenal nodule or mass. 2.5 x 2.6 cm  heterogeneous exophytic lesion identified lower pole right kidney, potentially with some mineralization. Left kidney unremarkable. No hydroureter. The urinary bladder appears normal for the degree of distention. Stomach/Bowel: Diffuse gastric wall thickening evident, likely accentuated by underdistention. Duodenum is normally positioned as is the ligament of Treitz. No small bowel wall thickening. No small bowel dilatation. No gross colonic mass. No colonic wall thickening. Moderate stool volume evident. Vascular/Lymphatic: There is advanced atherosclerotic calcification of the abdominal aorta without aneurysm. There is no gastrohepatic or hepatoduodenal ligament lymphadenopathy. 12 mm short axis left para-aortic lymph node evident. Other mildly enlarged retroperitoneal lymph nodes are identified. 13 mm short axis right common iliac node on 106/2. Mild lymphadenopathy is identified in the groin regions. Reproductive: Prostate gland is enlarged. Other: No substantial intraperitoneal free fluid. Musculoskeletal: Extremely large left groin hernia contains small bowel and colon. There is some congestion in the small bowel mesentery of the herniated segments with probable small volume fluid in the hernia sac. No features to suggest overt bowel obstruction due to the herniated bowel segments. No worrisome lytic or  sclerotic osseous abnormality. IMPRESSION: 1. Consolidative airspace disease in the left lower lobe compatible pneumonia. Imaging follow-up recommended to ensure complete resolution. 2. Extremely large left groin hernia contains small bowel and colon. There is some congestion in the small bowel mesentery of the herniated segments with probable small volume fluid in the hernia sac. No features to suggest overt bowel obstruction due to the herniated bowel segments. Correlation for signs/symptoms of incarceration recommended. 3. Marked diffuse dilatation of the main pancreatic duct measuring up to 7 mm diameter in  the pancreatic tail region. Pancreatic duct becomes obscured near the junction of the body and tail without a discernible mass lesion on noncontrast imaging. Main pancreatic duct is also distended through the pancreatic head region. Imaging of the pancreas is limited by lack of intravenous contrast material. MRI of the abdomen with and without contrast recommended to further evaluate. 4. 2.5 x 2.6 cm heterogeneous exophytic lesion lower pole right kidney, potentially with some mineralization. Findings raise concern for renal cell carcinoma. This could also be further assessed at the time of MRI. 5. Axillary, retroperitoneal, and groin lymphadenopathy. Lymphoproliferative disorder not excluded. 6. 8 mm right middle lobe and 6 mm right lower lobe pulmonary nodules. Non-contrast chest CT at 3-6 months is recommended. If the nodules are stable at time of repeat CT, then future CT at 18-24 months (from today's scan) is considered optional for low-risk patients, but is recommended for high-risk patients. This recommendation follows the consensus statement: Guidelines for Management of Incidental Pulmonary Nodules Detected on CT Images: From the Fleischner Society 2017; Radiology 2017; 284:228-243. 7. 4.2 cm ascending thoracic aortic aneurysm. Recommend annual imaging followup by CTA or MRA. This recommendation follows 2010 ACCF/AHA/AATS/ACR/ASA/SCA/SCAI/SIR/STS/SVM Guidelines for the Diagnosis and Management of Patients with Thoracic Aortic Disease. Circulation. 2010; 121: [id number]. Aortic aneurysm NOS (ICD10-I71.9) 8. Aortic Atherosclerosis (ICD10-I70.0) and Emphysema (ICD10-J43.9). Electronically Signed   By: Camellia Candle M.D.   On: 04/03/2024 05:02      Time coordinating discharge: over 30 minutes  SIGNED:  Jadynn Epping DO [facility]       [1] No Known Allergies

## 2024-04-21 NOTE — Plan of Care (Signed)
°  Problem: Education: Goal: Knowledge of General Education information will improve Description: Including pain rating scale, medication(s)/side effects and non-pharmacologic comfort measures Outcome: Adequate for Discharge   Problem: Health Behavior/Discharge Planning: Goal: Ability to manage health-related needs will improve Outcome: Adequate for Discharge   Problem: Clinical Measurements: Goal: Ability to maintain clinical measurements within normal limits will improve Outcome: Adequate for Discharge Goal: Will remain free from infection Outcome: Adequate for Discharge Goal: Diagnostic test results will improve Outcome: Adequate for Discharge Goal: Respiratory complications will improve Outcome: Adequate for Discharge Goal: Cardiovascular complication will be avoided Outcome: Adequate for Discharge   Problem: Activity: Goal: Risk for activity intolerance will decrease Outcome: Adequate for Discharge   Problem: Nutrition: Goal: Adequate nutrition will be maintained Outcome: Adequate for Discharge   Problem: Coping: Goal: Level of anxiety will decrease Outcome: Adequate for Discharge   Problem: Pain Managment: Goal: General experience of comfort will improve and/or be controlled Outcome: Adequate for Discharge   Problem: Pain Managment: Goal: General experience of comfort will improve and/or be controlled Outcome: Adequate for Discharge   Problem: Elimination: Goal: Will not experience complications related to bowel motility Outcome: Adequate for Discharge Goal: Will not experience complications related to urinary retention Outcome: Adequate for Discharge   Problem: Coping: Goal: Level of anxiety will decrease Outcome: Adequate for Discharge   Problem: Safety: Goal: Ability to remain free from injury will improve Outcome: Adequate for Discharge

## 2024-04-24 ENCOUNTER — Telehealth: Payer: Self-pay | Admitting: Family

## 2024-04-24 NOTE — Progress Notes (Deleted)
 Advanced [facility] Failure Clinic Note   PCP: [facility] (last seen ~ 10/24) Cardiologist: None      Chief Complaint: shortness of breath   HPI:  Darin Hopkins is a 63 y/o male with a history of HTN, COPD, seizures, previous tobacco/ alcohol use and chronic heart failure.   Has not been admitted or been in the ED in the last 6 months.   Echo 08/18/20: EF of 60-65% along with mild LVH and moderate AS.  He presents today, with his sister, for a HF follow-up visit with a chief complaint of shortness of breath. Has associated fatigue and occasional palpitations along with this. Denies chest pain, palpitations, abdominal distention, pedal edema, dizziness or difficulty sleeping. Continues to wear his oxygen at 4L around the clock.   Meds are bubblepacked through his pharmacy. Has missed multiple appointment as they are currently having issues with transportation.   ROS: All systems negative except as listed in HPI, PMH and Problem list   Past Medical History:  Diagnosis Date   CHF (congestive heart failure) (HCC)    COPD (chronic obstructive pulmonary disease) (HCC)    Hypertension    Seizures (HCC)     Current Outpatient Medications  Medication Sig Dispense Refill   acetaminophen  (TYLENOL ) 325 MG tablet Take 2 tablets (650 mg total) by mouth every 6 (six) hours as needed for mild pain (pain score 1-3) or fever (or Fever >/= 101).     atorvastatin  (LIPITOR) 20 MG tablet Take 1 tablet (20 mg total) by mouth every evening. 30 tablet 0   bisoprolol  2.5 MG TABS Take 2.5 mg by mouth at bedtime. 30 tablet 0   cholecalciferol (VITAMIN D3) 25 MCG (1000 UNIT) tablet Take 1 tablet (1,000 Units total) by mouth daily. 30 tablet 0   FARXIGA  10 MG TABS tablet Take 1 tablet (10 mg total) by mouth daily before breakfast. 30 tablet 0   FEROSUL 325 (65 Fe) MG tablet Take 1 tablet (325 mg total) by mouth daily with breakfast. 30 tablet 0   folic acid  (FOLVITE ) 1 MG tablet Take 1 tablet (1 mg total) by  mouth daily. 30 tablet 0   levETIRAcetam  (KEPPRA ) 750 MG tablet Take 1 tablet (750 mg total) by mouth 2 (two) times daily. 60 tablet 0   Multiple Vitamin (MULTIVITAMIN ADULT) TABS Take 2 tablets by mouth daily. 60 tablet 0   omeprazole  (PRILOSEC) 20 MG capsule Take 1 capsule (20 mg total) by mouth daily before breakfast. 30 capsule 0   PROAIR  HFA 108 (90 Base) MCG/ACT inhaler Inhale 2 puffs into the lungs every 6 (six) hours as needed for shortness of breath or wheezing. 18 g 0   sodium zirconium cyclosilicate  (LOKELMA ) 10 g PACK packet Take 10 g by mouth daily. 30 packet 0   thiamine  (VITAMIN B1) 100 MG tablet Take 1 tablet (100 mg total) by mouth daily. 30 tablet 0   Tiotropium Bromide -Olodaterol (STIOLTO RESPIMAT ) 2.5-2.5 MCG/ACT AERS Inhale 2 puffs into the lungs daily. Pt needs to make an appointment to continue to receive refills please. 1 each 0   traZODone  (DESYREL ) 50 MG tablet Take 1 tablet (50 mg total) by mouth at bedtime. 30 tablet 0   No current facility-administered medications for this visit.    No Known Allergies    Social History   Socioeconomic History   Marital status: Divorced    Spouse name: Not on file   Number of children: Not on file   Years of education: Not  on file   Highest education level: Not on file  Occupational History   Not on file  Tobacco Use   Smoking status: Former    Current packs/day: 0.00    Average packs/day: 2.0 packs/day    Types: Cigarettes    Quit date: 2021    Years since quitting: 4.9   Smokeless tobacco: Never  Vaping Use   Vaping status: Never Used  Substance and Sexual Activity   Alcohol use: Yes    Alcohol/week: 3.0 standard drinks of alcohol    Types: 3 Cans of beer per week    Comment: daily   Drug use: Not Currently   Sexual activity: Not on file  Other Topics Concern   Not on file  Social History Narrative   Not on file   Social Drivers of Health   Tobacco Use: Medium Risk (04/03/2024)   Patient History     Smoking Tobacco Use: Former    Smokeless Tobacco Use: Never    Passive Exposure: Not on Actuary Strain: Not on file  Food Insecurity: No Food Insecurity (04/03/2024)   Epic    Worried About Programme Researcher, Broadcasting/film/video in the Last Year: Never true    Ran Out of Food in the Last Year: Never true  Transportation Needs: No Transportation Needs (04/03/2024)   Epic    Lack of Transportation (Medical): No    Lack of Transportation (Non-Medical): No  Physical Activity: Not on file  Stress: Not on file  Social Connections: Not on file  Intimate Partner Violence: Not At Risk (04/03/2024)   Epic    Fear of Current or Ex-Partner: No    Emotionally Abused: No    Physically Abused: No    Sexually Abused: No  Depression (PHQ2-9): Low Risk (02/11/2022)   Depression (PHQ2-9)    PHQ-2 Score: 0  Alcohol Screen: Not on file  Housing: Low Risk (04/03/2024)   Epic    Unable to Pay for Housing in the Last Year: No    Number of Times Moved in the Last Year: 0    Homeless in the Last Year: No  Utilities: Not At Risk (04/03/2024)   Epic    Threatened with loss of utilities: No  Health Literacy: Not on file     No family history on file.  There were no vitals filed for this visit.  Wt Readings from Last 3 Encounters:  04/03/24 117 lb 11.6 oz (53.4 kg)  02/01/24 120 lb (54.4 kg)  09/20/23 126 lb (57.2 kg)   Lab Results  Component Value Date   CREATININE 1.86 (H) 04/20/2024   CREATININE 1.92 (H) 04/18/2024   CREATININE 1.78 (H) 04/17/2024    PHYSICAL EXAM:  General: Well appearing, thin. No resp difficulty HEENT: normal Neck: supple, no JVD Cor: Regular rhythm, rate. No rubs, gallops or murmurs Lungs: clear Abdomen: soft, nontender, nondistended. Extremities: no cyanosis, clubbing, rash, edema Neuro: alert & oriented X 3. Moves all 4 extremities w/o difficulty. Affect pleasant   ECG: not done   ASSESSMENT & PLAN:  1: NICM with preserved ejection fraction - - suspect  due to COPD/ previous alcohol use - NYHA class II - euvolemic today - weighing daily; reminded to call for an overnight weight gain of > 2 pounds or a weekly weight gain of > 5 pounds - weight down 3 pounds from last visit here 2.5 months ago - Echo 08/18/20: EF of 60-65% along with mild LVH and moderate AS. - NS  for last echo; have r/s this for 11/10/23 - continue bisoprolol  5mg  BID - continue furosemide  40mg  daily/ potassium 10meq daily - begin farxiga  10mg  daily and stop doxazosin   - BP may not tolerate MRA - they can't stay for labs; BMET order faxed to PCP office - social work consult placed to assist with transportation - rarely adding salt and his sister has been reading food labels for sodium content - BNP 11/25/20 was 575.8  2: HTN- - BP 130/85 - continue amlodipine  10mg  daily - continue hydralazine  25mg  TID; would like to get off hydralazine  to ease pill burden - follows with [facility]  - BMP 07/07/23 reviewed: sodium 147, potassium 5.0, creatinine 2.21 and GFR 33  3: COPD- - wearing oxygen at 3.5-4L around the clock - saw pulmonology [place]) 05/23 - remains a non-smoker  4: Hyperlipidemia- - continue atorvastatin  20mg  daily - LDL 07/07/23 was 84   Return in 2 months, sooner if needed.   [name], [place] 09/20/23

## 2024-04-24 NOTE — Telephone Encounter (Signed)
 Called to confirm/remind patient of their appointment at the [facility] on 04/25/24.   Appointment:   [] Confirmed  [x] Left mess   [] No answer/No voice mail  [] VM Full/unable to leave message  [] Phone not in service  Patient reminded to bring all medications and/or complete list.  Confirmed patient has transportation. Gave directions, instructed to utilize valet parking.

## 2024-04-25 ENCOUNTER — Telehealth: Payer: Self-pay | Admitting: Family

## 2024-04-25 ENCOUNTER — Ambulatory Visit: Admitting: Family

## 2024-04-25 NOTE — Telephone Encounter (Signed)
 Patient did not show for his Heart Failure Clinic appointment on 04/25/24.

## 2024-05-21 ENCOUNTER — Emergency Department

## 2024-05-21 ENCOUNTER — Encounter: Payer: Self-pay | Admitting: Emergency Medicine

## 2024-05-21 ENCOUNTER — Inpatient Hospital Stay
Admission: EM | Admit: 2024-05-21 | Discharge: 2024-05-26 | DRG: 189 | Disposition: A | Discharge status: Skilled Nursing Facility | Attending: Obstetrics and Gynecology | Admitting: Obstetrics and Gynecology

## 2024-05-21 ENCOUNTER — Other Ambulatory Visit: Payer: Self-pay

## 2024-05-21 DIAGNOSIS — J9611 Chronic respiratory failure with hypoxia: Secondary | ICD-10-CM | POA: Diagnosis present

## 2024-05-21 DIAGNOSIS — R262 Difficulty in walking, not elsewhere classified: Secondary | ICD-10-CM | POA: Diagnosis present

## 2024-05-21 DIAGNOSIS — Z681 Body mass index (BMI) 19 or less, adult: Secondary | ICD-10-CM

## 2024-05-21 DIAGNOSIS — E43 Unspecified severe protein-calorie malnutrition: Secondary | ICD-10-CM | POA: Diagnosis present

## 2024-05-21 DIAGNOSIS — K219 Gastro-esophageal reflux disease without esophagitis: Secondary | ICD-10-CM | POA: Diagnosis present

## 2024-05-21 DIAGNOSIS — D696 Thrombocytopenia, unspecified: Secondary | ICD-10-CM | POA: Diagnosis present

## 2024-05-21 DIAGNOSIS — E871 Hypo-osmolality and hyponatremia: Secondary | ICD-10-CM | POA: Diagnosis present

## 2024-05-21 DIAGNOSIS — D631 Anemia in chronic kidney disease: Secondary | ICD-10-CM | POA: Diagnosis present

## 2024-05-21 DIAGNOSIS — Z87891 Personal history of nicotine dependence: Secondary | ICD-10-CM

## 2024-05-21 DIAGNOSIS — R627 Adult failure to thrive: Secondary | ICD-10-CM | POA: Diagnosis present

## 2024-05-21 DIAGNOSIS — Z79899 Other long term (current) drug therapy: Secondary | ICD-10-CM

## 2024-05-21 DIAGNOSIS — M6281 Muscle weakness (generalized): Secondary | ICD-10-CM | POA: Diagnosis present

## 2024-05-21 DIAGNOSIS — R64 Cachexia: Secondary | ICD-10-CM | POA: Diagnosis present

## 2024-05-21 DIAGNOSIS — I10 Essential (primary) hypertension: Secondary | ICD-10-CM | POA: Diagnosis present

## 2024-05-21 DIAGNOSIS — Z87440 Personal history of urinary (tract) infections: Secondary | ICD-10-CM

## 2024-05-21 DIAGNOSIS — E860 Dehydration: Secondary | ICD-10-CM | POA: Diagnosis present

## 2024-05-21 DIAGNOSIS — J449 Chronic obstructive pulmonary disease, unspecified: Secondary | ICD-10-CM | POA: Diagnosis present

## 2024-05-21 DIAGNOSIS — G40909 Epilepsy, unspecified, not intractable, without status epilepticus: Secondary | ICD-10-CM | POA: Diagnosis present

## 2024-05-21 DIAGNOSIS — K8689 Other specified diseases of pancreas: Secondary | ICD-10-CM | POA: Diagnosis present

## 2024-05-21 DIAGNOSIS — R5381 Other malaise: Secondary | ICD-10-CM | POA: Diagnosis present

## 2024-05-21 DIAGNOSIS — I5032 Chronic diastolic (congestive) heart failure: Secondary | ICD-10-CM | POA: Diagnosis present

## 2024-05-21 DIAGNOSIS — N179 Acute kidney failure, unspecified: Secondary | ICD-10-CM | POA: Diagnosis present

## 2024-05-21 DIAGNOSIS — E870 Hyperosmolality and hypernatremia: Secondary | ICD-10-CM | POA: Diagnosis present

## 2024-05-21 DIAGNOSIS — I130 Hypertensive heart and chronic kidney disease with heart failure and stage 1 through stage 4 chronic kidney disease, or unspecified chronic kidney disease: Secondary | ICD-10-CM | POA: Diagnosis present

## 2024-05-21 DIAGNOSIS — J9621 Acute and chronic respiratory failure with hypoxia: Principal | ICD-10-CM | POA: Diagnosis present

## 2024-05-21 DIAGNOSIS — E785 Hyperlipidemia, unspecified: Secondary | ICD-10-CM | POA: Diagnosis present

## 2024-05-21 DIAGNOSIS — R531 Weakness: Principal | ICD-10-CM

## 2024-05-21 DIAGNOSIS — J9622 Acute and chronic respiratory failure with hypercapnia: Secondary | ICD-10-CM | POA: Diagnosis present

## 2024-05-21 DIAGNOSIS — R7989 Other specified abnormal findings of blood chemistry: Secondary | ICD-10-CM

## 2024-05-21 DIAGNOSIS — N1832 Chronic kidney disease, stage 3b: Secondary | ICD-10-CM | POA: Diagnosis present

## 2024-05-21 DIAGNOSIS — E861 Hypovolemia: Secondary | ICD-10-CM | POA: Diagnosis present

## 2024-05-21 LAB — URINALYSIS, ROUTINE W REFLEX MICROSCOPIC
Bacteria, UA: NONE SEEN
Bilirubin Urine: NEGATIVE
Glucose, UA: 150 mg/dL — AB
Ketones, ur: NEGATIVE mg/dL
Leukocytes,Ua: NEGATIVE
Nitrite: NEGATIVE
Protein, ur: 100 mg/dL — AB
Specific Gravity, Urine: 1.015 (ref 1.005–1.030)
pH: 5 (ref 5.0–8.0)

## 2024-05-21 LAB — HEPATIC FUNCTION PANEL
ALT: 15 U/L (ref 0–44)
AST: 28 U/L (ref 15–41)
Albumin: 4.1 g/dL (ref 3.5–5.0)
Alkaline Phosphatase: 55 U/L (ref 38–126)
Bilirubin, Direct: 0.2 mg/dL (ref 0.0–0.2)
Indirect Bilirubin: 0.3 mg/dL (ref 0.3–0.9)
Total Bilirubin: 0.5 mg/dL (ref 0.0–1.2)
Total Protein: 7.7 g/dL (ref 6.5–8.1)

## 2024-05-21 LAB — CBC WITH DIFFERENTIAL/PLATELET
Abs Immature Granulocytes: 0.01 K/uL (ref 0.00–0.07)
Basophils Absolute: 0 K/uL (ref 0.0–0.1)
Basophils Relative: 0 %
Eosinophils Absolute: 0 K/uL (ref 0.0–0.5)
Eosinophils Relative: 1 %
HCT: 32.8 % — ABNORMAL LOW (ref 39.0–52.0)
Hemoglobin: 9.7 g/dL — ABNORMAL LOW (ref 13.0–17.0)
Immature Granulocytes: 0 %
Lymphocytes Relative: 34 %
Lymphs Abs: 2.8 K/uL (ref 0.7–4.0)
MCH: 28.6 pg (ref 26.0–34.0)
MCHC: 29.6 g/dL — ABNORMAL LOW (ref 30.0–36.0)
MCV: 96.8 fL (ref 80.0–100.0)
Monocytes Absolute: 0.2 K/uL (ref 0.1–1.0)
Monocytes Relative: 3 %
Neutro Abs: 5.2 K/uL (ref 1.7–7.7)
Neutrophils Relative %: 62 %
Platelets: 115 K/uL — ABNORMAL LOW (ref 150–400)
RBC: 3.39 MIL/uL — ABNORMAL LOW (ref 4.22–5.81)
RDW: 14.3 % (ref 11.5–15.5)
WBC: 8.3 K/uL (ref 4.0–10.5)
nRBC: 0 % (ref 0.0–0.2)

## 2024-05-21 LAB — AMMONIA: Ammonia: 23 umol/L (ref 9–35)

## 2024-05-21 LAB — BASIC METABOLIC PANEL WITH GFR
Anion gap: 11 (ref 5–15)
BUN: 29 mg/dL — ABNORMAL HIGH (ref 8–23)
CO2: 32 mmol/L (ref 22–32)
Calcium: 9.5 mg/dL (ref 8.9–10.3)
Chloride: 104 mmol/L (ref 98–111)
Creatinine, Ser: 2.14 mg/dL — ABNORMAL HIGH (ref 0.61–1.24)
GFR, Estimated: 34 mL/min — ABNORMAL LOW
Glucose, Bld: 86 mg/dL (ref 70–99)
Potassium: 3.9 mmol/L (ref 3.5–5.1)
Sodium: 148 mmol/L — ABNORMAL HIGH (ref 135–145)

## 2024-05-21 LAB — BLOOD GAS, VENOUS
Acid-Base Excess: 8.9 mmol/L — ABNORMAL HIGH (ref 0.0–2.0)
Bicarbonate: 39 mmol/L — ABNORMAL HIGH (ref 20.0–28.0)
O2 Saturation: 63.1 %
Patient temperature: 37
pCO2, Ven: 83 mmHg (ref 44–60)
pH, Ven: 7.28 (ref 7.25–7.43)
pO2, Ven: 38 mmHg (ref 32–45)

## 2024-05-21 LAB — TROPONIN T, HIGH SENSITIVITY
Troponin T High Sensitivity: 200 ng/L (ref 0–19)
Troponin T High Sensitivity: 188 ng/L (ref 0–19)

## 2024-05-21 MED ORDER — ONDANSETRON HCL 4 MG/2ML IJ SOLN: 4.0000 mg | Freq: Four times a day (QID) | INTRAMUSCULAR | Status: DC | PRN

## 2024-05-21 MED ORDER — ACETAMINOPHEN 650 MG RE SUPP: 650.0000 mg | Freq: Four times a day (QID) | RECTAL | Status: DC | PRN

## 2024-05-21 MED ORDER — ACETAMINOPHEN 325 MG PO TABS: 650.0000 mg | ORAL_TABLET | Freq: Four times a day (QID) | ORAL | Status: DC | PRN

## 2024-05-21 MED ORDER — SENNOSIDES-DOCUSATE SODIUM 8.6-50 MG PO TABS: 1.0000 | ORAL_TABLET | Freq: Every evening | ORAL | Status: DC | PRN

## 2024-05-21 MED ORDER — SODIUM CHLORIDE 0.9 % IV BOLUS
500.0000 mL | Freq: Once | INTRAVENOUS | Status: AC
  Administered 2024-05-21: 500 mL via INTRAVENOUS

## 2024-05-21 MED ORDER — HEPARIN SODIUM (PORCINE) 5000 UNIT/ML IJ SOLN
5000.0000 [IU] | Freq: Three times a day (TID) | INTRAMUSCULAR | Status: DC
  Administered 2024-05-21 – 2024-05-26 (×14): 5000 [IU] via SUBCUTANEOUS
  Filled 2024-05-21 (×14): qty 1

## 2024-05-21 MED ORDER — ONDANSETRON HCL 4 MG PO TABS: 4.0000 mg | ORAL_TABLET | Freq: Four times a day (QID) | ORAL | Status: DC | PRN

## 2024-05-21 MED ORDER — SODIUM CHLORIDE 0.9% FLUSH
3.0000 mL | Freq: Two times a day (BID) | INTRAVENOUS | Status: DC
  Administered 2024-05-21 – 2024-05-26 (×10): 3 mL via INTRAVENOUS

## 2024-05-21 NOTE — ED Notes (Signed)
 BiPAP resumed. Admitting MD in rorom.

## 2024-05-21 NOTE — H&P (Signed)
 " History and Physical    Darin Hopkins FMW:[id number] DOB: 08/31/1960 DOA: 05/21/2024  DOS: the patient was seen and examined on 05/21/2024  PCP: [name], NP   Patient coming from: Home  I have personally briefly reviewed patient's old medical records in [facility] and CareEverywhere  HPI:   Darin Hopkins is a 64 y.o. year old male with medical history of hypertension, hyperlipidemia, chronic respiratory failure with hypoxia and hypercapnia secondary to COPD, CHF presenting to the ED with generalized weakness. On arrival to the ED patient was noted to be HDS stable.  Lab work and imaging obtained.  CBC without leukocytosis, mild anemia improved from baseline, thrombocytopenia that is mild at 117K.  BMP with mild hypernatremia at 148, slight creatinine elevation with creatinine at 2.14 consistent with CKD 3B.  Hepatic function checked and normal.  Troponin checked and elevated but downtrending.  Ammonia checked and normal.  VBG showed 7.2 8/83/38.  UA without signs of infection.  Chest x-ray showing bibasilar opacities concerning for atelectasis versus pneumonia.  Patient's family at bedside stating it is difficult to care for the patient and he is having difficulty with ambulation, given need for continued care, [facility] contacted for admission.  Review of Systems: As mentioned in the history of present illness. All other systems reviewed and are negative.   Past Medical History:  Diagnosis Date   CHF (congestive heart failure) (HCC)    COPD (chronic obstructive pulmonary disease) (HCC)    Hypertension    Seizures (HCC)     History reviewed. No pertinent surgical history.   Allergies[1]  History reviewed. No pertinent family history.  Prior to Admission medications  Medication Sig Start Date End Date Taking? Authorizing Provider  acetaminophen  (TYLENOL ) 325 MG tablet Take 2 tablets (650 mg total) by mouth every 6 (six) hours as needed for mild pain (pain score 1-3) or  fever (or Fever >/= 101). 04/12/24  Yes Wieting, Richard, MD  atorvastatin  (LIPITOR) 20 MG tablet Take 1 tablet (20 mg total) by mouth every evening. 04/16/24  Yes Wieting, Richard, MD  bisoprolol  2.5 MG TABS Take 2.5 mg by mouth at bedtime. 04/16/24  Yes Wieting, Richard, MD  cholecalciferol (VITAMIN D3) 25 MCG (1000 UNIT) tablet Take 1 tablet (1,000 Units total) by mouth daily. 04/16/24  Yes Josette Ade, MD  FARXIGA  10 MG TABS tablet Take 1 tablet (10 mg total) by mouth daily before breakfast. 04/16/24  Yes Wieting, Richard, MD  FEROSUL 325 (65 Fe) MG tablet Take 1 tablet (325 mg total) by mouth daily with breakfast. 04/16/24  Yes Wieting, Richard, MD  folic acid  (FOLVITE ) 1 MG tablet Take 1 tablet (1 mg total) by mouth daily. 04/16/24  Yes Wieting, Richard, MD  levETIRAcetam  (KEPPRA ) 750 MG tablet Take 1 tablet (750 mg total) by mouth 2 (two) times daily. 04/16/24  Yes Wieting, Richard, MD  Multiple Vitamin (MULTIVITAMIN ADULT) TABS Take 2 tablets by mouth daily. 04/16/24  Yes Josette Ade, MD  omeprazole  (PRILOSEC) 20 MG capsule Take 1 capsule (20 mg total) by mouth daily before breakfast. 04/16/24  Yes Josette Ade, MD  PROAIR  HFA 108 (90 Base) MCG/ACT inhaler Inhale 2 puffs into the lungs every 6 (six) hours as needed for shortness of breath or wheezing. 04/16/24  Yes Wieting, Richard, MD  sodium zirconium cyclosilicate  (LOKELMA ) 10 g PACK packet Take 10 g by mouth daily. 04/16/24  Yes Wieting, Richard, MD  thiamine  (VITAMIN B1) 100 MG tablet Take 1 tablet (100 mg  total) by mouth daily. 04/16/24  Yes Wieting, Richard, MD  Tiotropium Bromide -Olodaterol (STIOLTO RESPIMAT ) 2.5-2.5 MCG/ACT AERS Inhale 2 puffs into the lungs daily. Pt needs to make an appointment to continue to receive refills please. 04/16/24  Yes Wieting, Richard, MD  traZODone  (DESYREL ) 50 MG tablet Take 1 tablet (50 mg total) by mouth at bedtime. 04/16/24  Yes Wieting, Richard, MD    Social History:  reports that he quit smoking  about 5 years ago. His smoking use included cigarettes. He smoked an average of 2 packs per day. He has never used smokeless tobacco. He reports current alcohol use of about 3.0 standard drinks of alcohol per week. He reports that he does not currently use drugs.    Physical Exam: Vitals:   05/21/24 1916 05/21/24 1919 05/21/24 1930 05/21/24 2000  BP:   (!) 185/101 (!) 184/91  Pulse:  (!) 59 60 (!) 53  Resp:  15 15 13   Temp:      TempSrc:      SpO2: 100% 100% 99% 98%  Weight:      Height:        Gen: NAD HENT: NCAT CV: normal heart sounds Lung: CTAB Abd: No TTP, normal bowel sounds MSK: No asymmetry, good bulk and tone Neuro: alert and oriented   Labs on Admission: I have personally reviewed following labs and imaging studies  CBC: Recent Labs  Lab 05/21/24 1259  WBC 8.3  NEUTROABS 5.2  HGB 9.7*  HCT 32.8*  MCV 96.8  PLT 115*   Basic Metabolic Panel: Recent Labs  Lab 05/21/24 1259  NA 148*  K 3.9  CL 104  CO2 32  GLUCOSE 86  BUN 29*  CREATININE 2.14*  CALCIUM  9.5   GFR: Estimated Creatinine Clearance: 27 mL/min (A) (by C-G formula based on SCr of 2.14 mg/dL (H)). Liver Function Tests: Recent Labs  Lab 05/21/24 1259  AST 28  ALT 15  ALKPHOS 55  BILITOT 0.5  PROT 7.7  ALBUMIN 4.1   No results for input(s): LIPASE, AMYLASE in the last 168 hours. Recent Labs  Lab 05/21/24 1607  AMMONIA 23   Coagulation Profile: No results for input(s): INR, PROTIME in the last 168 hours. Cardiac Enzymes: No results for input(s): CKTOTAL, CKMB, CKMBINDEX, TROPONINI, TROPONINIHS in the last 168 hours. BNP (last 3 results) Recent Labs    02/01/24 1035  BNP 196.8*   HbA1C: No results for input(s): HGBA1C in the last 72 hours. CBG: No results for input(s): GLUCAP in the last 168 hours. Lipid Profile: No results for input(s): CHOL, HDL, LDLCALC, TRIG, CHOLHDL, LDLDIRECT in the last 72 hours. Thyroid  Function Tests: No  results for input(s): TSH, T4TOTAL, FREET4, T3FREE, THYROIDAB in the last 72 hours. Anemia Panel: No results for input(s): VITAMINB12, FOLATE, FERRITIN, TIBC, IRON , RETICCTPCT in the last 72 hours. Urine analysis:    Component Value Date/Time   COLORURINE YELLOW (A) 05/21/2024 1607   APPEARANCEUR HAZY (A) 05/21/2024 1607   APPEARANCEUR Clear 11/02/2013 0205   LABSPEC 1.015 05/21/2024 1607   LABSPEC 1.005 11/02/2013 0205   PHURINE 5.0 05/21/2024 1607   GLUCOSEU 150 (A) 05/21/2024 1607   GLUCOSEU Negative 11/02/2013 0205   HGBUR SMALL (A) 05/21/2024 1607   BILIRUBINUR NEGATIVE 05/21/2024 1607   BILIRUBINUR Negative 11/02/2013 0205   KETONESUR NEGATIVE 05/21/2024 1607   PROTEINUR 100 (A) 05/21/2024 1607   NITRITE NEGATIVE 05/21/2024 1607   LEUKOCYTESUR NEGATIVE 05/21/2024 1607   LEUKOCYTESUR Negative 11/02/2013 0205    Radiological Exams  on Admission: I have personally reviewed images DG Chest Portable 1 View Result Date: 05/21/2024 CLINICAL DATA:  Weakness EXAM: PORTABLE CHEST 1 VIEW COMPARISON:  02/01/2024 FINDINGS: Two frontal views of the chest demonstrates stable enlargement of the cardiac silhouette. There are bibasilar ground-glass opacities, right greater than left, which could reflect infection or aspiration. Trace left pleural effusion. No pneumothorax. No acute bony abnormalities. IMPRESSION: 1. Bibasilar ground-glass airspace disease, which may reflect bibasilar pneumonia or aspiration. 2. Trace left pleural effusion. Electronically Signed   By: Ozell Daring M.D.   On: 05/21/2024 16:43    EKG: My personal interpretation of EKG shows: Normal sinus rhythm without any acute ST changes fascicular block present.    Assessment/Plan Principal Problem:   Failure to thrive in adult Active Problems:   COPD (chronic obstructive pulmonary disease) (HCC)   Pancreatic mass   Essential hypertension   Hyperlipidemia   GERD (gastroesophageal reflux disease)    Chronic respiratory failure with hypoxia and hypercapnia (HCC)   Chronic diastolic CHF (congestive heart failure) (HCC)     VTE prophylaxis:  SQ Heparin   Diet: N.p.o. Code Status:  Full Code Telemetry:  Admission status: Observation, Progressive Patient is from: Home Anticipated d/c is to: Home Anticipated d/c is in: 1-2 days   Family Communication: Updated at bedside  Consults called: None   Severity of Illness: The appropriate patient status for this patient is OBSERVATION. Observation status is judged to be reasonable and necessary in order to provide the required intensity of service to ensure the patient's safety. The patient's presenting symptoms, physical exam findings, and initial radiographic and laboratory data in the context of their medical condition is felt to place them at decreased risk for further clinical deterioration. Furthermore, it is anticipated that the patient will be medically stable for discharge from the hospital within 2 midnights of admission.    Morene Bathe, MD [facility] DEL. [facility]     [1] No Known Allergies  "

## 2024-05-21 NOTE — ED Provider Notes (Signed)
 "  [facility] Provider Note    Event Date/Time   First MD Initiated Contact with Patient 05/21/24 1459     (approximate)   History   Weakness   HPI  Darin Hopkins is a 64 y.o. male  COPD, HTN, left inguinal hernia, GERD, CHF, history of seizure, HLD, alcohol dependence, protein calorie malnutrition, CKD who comes in with concerns for weakness.  I reviewed a note from 04/03/2024 where patient had issues with his oxygen tank and he supposed to be on 2 to 3 L chronically.  He was treated for possible pneumonia.  MRI of the abdomen showed concern for possible pancreatic mass he was given 1 L of units further low RBCs.  It looks that there was a delay with discharge secondary to issues with money and making sure that he had everything in the home.  He was discharged home but EMS brought him back and realized a hospital bed had never actually been delivered.  And then he was eventually discharged on 12/13.  Today they noticed some increasing weakness over the past 1 to 2 weeks.  Where they tried to get in the car and he slid onto the ground due to weakness per EMS.  Patient denies hitting his head.  He reports just feeling more weak today than normal denies any cough, shortness of breath, vision changes, sensation changes, weakness on one side, slurred speech or any other concerns.  He states that he does not drink alcohol.  He he reports that today when he was going to go out with his family he just felt really weak and he went slowly to the ground but he did not hit his head he denies any headaches.  Patient has also been seen back in September for increased weakness and at that time was treated for UTI.  3:49 PM called patient's family member and they do report that patient's legs just gave out when they tried to get to the car.  They report over the past 2 weeks he just not been really want to get up out of bed and that when they tried to get out of the bed today to  go to the doctor's his legs just gave out but he he never had any trauma to his heads.  He is otherwise back in his normal self it was just this weakness that they noted.     Physical Exam   Triage Vital Signs: ED Triage Vitals  Encounter Vitals Group     BP 05/21/24 1258 (!) 170/84     Girls Systolic BP Percentile --      Girls Diastolic BP Percentile --      Boys Systolic BP Percentile --      Boys Diastolic BP Percentile --      Pulse Rate 05/21/24 1258 71     Resp 05/21/24 1258 16     Temp 05/21/24 1258 98.5 F (36.9 C)     Temp Source 05/21/24 1258 Oral     SpO2 05/21/24 1248 98 %     Weight 05/21/24 1258 119 lb 0.8 oz (54 kg)     Height 05/21/24 1258 5' 7 (1.702 m)     Head Circumference --      Peak Flow --      Pain Score 05/21/24 1258 0     Pain Loc --      Pain Education --      Exclude from Growth Chart --  Most recent vital signs: Vitals:   05/21/24 1248 05/21/24 1258  BP:  (!) 170/84  Pulse:  71  Resp:  16  Temp:  98.5 F (36.9 C)  SpO2: 98% 97%     General: Awake, no distress. Elderly male, poor dentition.  CV:  Good peripheral perfusion.  Resp:  Normal effort. On baseline o2  Abd:  No distention.  Soft and nontender Other:  Cranials 2-12 are intact.  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  ED Results / Procedures / Treatments   Labs (all labs ordered are listed, but only abnormal results are displayed) Labs Reviewed  CBC WITH DIFFERENTIAL/PLATELET - Abnormal; Notable for the following components:      Result Value   RBC 3.39 (*)    Hemoglobin 9.7 (*)    HCT 32.8 (*)    MCHC 29.6 (*)    Platelets 115 (*)    All other components within normal limits  BASIC METABOLIC PANEL WITH GFR - Abnormal; Notable for the following components:   Sodium 148 (*)    BUN 29 (*)    Creatinine, Ser 2.14 (*)    GFR, Estimated 34 (*)    All other components within normal limits  URINALYSIS, ROUTINE W REFLEX  MICROSCOPIC  HEPATIC FUNCTION PANEL  AMMONIA  CBG MONITORING, ED  TROPONIN T, HIGH SENSITIVITY  TROPONIN T, HIGH SENSITIVITY     EKG  My interpretation of EKG:  Normal sinus rhythm 65 without any ST elevation or T wave inversions, normal intervals  RADIOLOGY I have reviewed the xray personally and interpreted possible pneumonia   PROCEDURES:  Critical Care performed: No  .1-3 Lead EKG Interpretation  Performed by: Ernest Ronal BRAVO, MD Authorized by: Ernest Ronal BRAVO, MD     Interpretation: normal     ECG rate:  70   ECG rate assessment: normal     Rhythm: sinus rhythm     Ectopy: none     Conduction: normal      MEDICATIONS ORDERED IN ED: Medications  sodium chloride  0.9 % bolus 500 mL (0 mLs Intravenous Stopped 05/21/24 1703)     IMPRESSION / MDM / ASSESSMENT AND PLAN / ED COURSE  I reviewed the triage vital signs and the nursing notes.   Patient's presentation is most consistent with acute presentation with potential threat to life or bodily function.  Patient comes in with generalized weakness with a near fall today but no head trauma nonfocal examination cranial nerves are intact.  He denies any headaches has had CT imaging of the brain in the last 2 months without any evidence of intracranial mass.  Patient's been seen multiple times for weakness in the past.  Will get workup to evaluate for Electra abnormalities, UTI, liver dysfunction, hypercapnia given history of COPD.  Will get chest x-ray to ensure no pneumonia.  BMP shows increasing creatinine up to 2.14 from a baseline of 1.8.  Will give some IV fluids  CBC shows stable hemoglobin up from his recent admission  Patient has new troponin elevation.  He denies any active chest pain reports chronic shortness of breath but this is higher from his baseline.  His VBG does show some hypercapnia but looks like chronic as his bicarb is also elevated so he is compensated.  Discussed with family and they report that  he is too weak to return home this has been an acute change more so over the past day.  They  do not feel comfortable with him returning at this point and feel like when he was admitted the hospital last time and recommended SNF admit he would benefit from some rehab.  Patient's chest x-ray concerning for some aspiration.  He denies any cough at this time.  Will continue to monitor may need to swallow studies inpatient.  Given the concern for acutely weakness with AKI, new elevated troponin will discussed with the hospitalist for admission.   FINAL CLINICAL IMPRESSION(S) / ED DIAGNOSES   Final diagnoses:  Weakness  AKI (acute kidney injury)  Elevated troponin     Rx / DC Orders   ED Discharge Orders     None        Note:  This document was prepared using Dragon voice recognition software and may include unintentional dictation errors.   Ernest Ronal BRAVO, MD 05/21/24 1814  "

## 2024-05-21 NOTE — ED Triage Notes (Signed)
 Pt arrives via EMS from home with reports of weakness for 2 weeks. Family was attempting to get pt in car and slid pt to ground due to weakness and states he needs to be sent to rehab. Pt on 4L [place] at home. Bedbugs found on pt.

## 2024-05-21 NOTE — ED Notes (Signed)
Lab called to stick pt.

## 2024-05-21 NOTE — ED Notes (Signed)
 Respiratory in room with BiPAP. Respiratory therapist reports air leak alarm will continuously alarm due to pts beard. CCMD called to initiate cardiac monitoring.

## 2024-05-21 NOTE — ED Notes (Signed)
 BiPAP removed per pt request - placed on chronic 4L [place]. Pt also requesting diet order. Attending messaged.

## 2024-05-21 NOTE — H&P (Incomplete)
 " History and Physical    Darin Hopkins FMW:[id number] DOB: 02-Apr-1961 DOA: 05/21/2024  DOS: the patient was seen and examined on 05/21/2024  PCP: [name], NP   Patient coming from: Home  I have personally briefly reviewed patient's old medical records in [facility] and CareEverywhere  HPI:   Darin Hopkins is a 64 y.o. year old male with medical history of hypertension, hyperlipidemia, chronic respiratory failure with hypoxia and hypercapnia secondary to COPD, CHF presenting to the ED with generalized weakness.  Patient reports weakness has been present over the last week but it has acutely worsened today.  He was able to ambulate but has not been able to ambulate today.  He reports when he stands up his legs give out.  He denies any URI symptoms, coughing, fevers or chills.  On arrival to the ED patient was noted to be HDS stable.  Lab work and imaging obtained.  CBC without leukocytosis, mild anemia improved from baseline, thrombocytopenia that is mild at 117K.  BMP with mild hypernatremia at 148, slight creatinine elevation with creatinine at 2.14 consistent with CKD 3B.  Hepatic function checked and normal.  Troponin checked and elevated but downtrending.  Ammonia checked and normal.  VBG showed 7.2 8/83/38.  UA without signs of infection.  Chest x-ray showing bibasilar opacities concerning for atelectasis versus pneumonia.  Patient's family at bedside stating it is difficult to care for the patient and he is having difficulty with ambulation, given need for continued care, [facility] contacted for admission.  Review of Systems: As mentioned in the history of present illness. All other systems reviewed and are negative.   Past Medical History:  Diagnosis Date   CHF (congestive heart failure) (HCC)    COPD (chronic obstructive pulmonary disease) (HCC)    Hypertension    Seizures (HCC)     History reviewed. No pertinent surgical history.   Allergies[1]  History reviewed.  No pertinent family history.  Prior to Admission medications  Medication Sig Start Date End Date Taking? Authorizing Provider  acetaminophen  (TYLENOL ) 325 MG tablet Take 2 tablets (650 mg total) by mouth every 6 (six) hours as needed for mild pain (pain score 1-3) or fever (or Fever >/= 101). 04/12/24  Yes Wieting, Richard, MD  atorvastatin  (LIPITOR) 20 MG tablet Take 1 tablet (20 mg total) by mouth every evening. 04/16/24  Yes Wieting, Richard, MD  bisoprolol  2.5 MG TABS Take 2.5 mg by mouth at bedtime. 04/16/24  Yes Wieting, Richard, MD  cholecalciferol (VITAMIN D3) 25 MCG (1000 UNIT) tablet Take 1 tablet (1,000 Units total) by mouth daily. 04/16/24  Yes Josette Ade, MD  FARXIGA  10 MG TABS tablet Take 1 tablet (10 mg total) by mouth daily before breakfast. 04/16/24  Yes Wieting, Richard, MD  FEROSUL 325 (65 Fe) MG tablet Take 1 tablet (325 mg total) by mouth daily with breakfast. 04/16/24  Yes Wieting, Richard, MD  folic acid  (FOLVITE ) 1 MG tablet Take 1 tablet (1 mg total) by mouth daily. 04/16/24  Yes Wieting, Richard, MD  levETIRAcetam  (KEPPRA ) 750 MG tablet Take 1 tablet (750 mg total) by mouth 2 (two) times daily. 04/16/24  Yes Wieting, Richard, MD  Multiple Vitamin (MULTIVITAMIN ADULT) TABS Take 2 tablets by mouth daily. 04/16/24  Yes Josette Ade, MD  omeprazole  (PRILOSEC) 20 MG capsule Take 1 capsule (20 mg total) by mouth daily before breakfast. 04/16/24  Yes Josette Ade, MD  PROAIR  HFA 108 (90 Base) MCG/ACT inhaler Inhale 2 puffs into  the lungs every 6 (six) hours as needed for shortness of breath or wheezing. 04/16/24  Yes Wieting, Richard, MD  sodium zirconium cyclosilicate  (LOKELMA ) 10 g PACK packet Take 10 g by mouth daily. 04/16/24  Yes Wieting, Richard, MD  thiamine  (VITAMIN B1) 100 MG tablet Take 1 tablet (100 mg total) by mouth daily. 04/16/24  Yes Wieting, Richard, MD  Tiotropium Bromide -Olodaterol (STIOLTO RESPIMAT ) 2.5-2.5 MCG/ACT AERS Inhale 2 puffs into the lungs daily. Pt  needs to make an appointment to continue to receive refills please. 04/16/24  Yes Wieting, Richard, MD  traZODone  (DESYREL ) 50 MG tablet Take 1 tablet (50 mg total) by mouth at bedtime. 04/16/24  Yes Wieting, Richard, MD    Social History:  reports that he quit smoking about 5 years ago. His smoking use included cigarettes. He smoked an average of 2 packs per day. He has never used smokeless tobacco. He reports current alcohol use of about 3.0 standard drinks of alcohol per week. He reports that he does not currently use drugs.    Physical Exam: Vitals:   05/21/24 1916 05/21/24 1919 05/21/24 1930 05/21/24 2000  BP:   (!) 185/101 (!) 184/91  Pulse:  (!) 59 60 (!) 53  Resp:  15 15 13   Temp:      TempSrc:      SpO2: 100% 100% 99% 98%  Weight:      Height:        Gen: NAD HENT: BiPAP in place CV: normal heart sounds Lung: CTAB Abd: No TTP, normal bowel sounds MSK: No asymmetry, good bulk and tone Neuro: alert and oriented x 4, CN II-XII intact, good strength and sensation in all extremities.  No focal deficits.   Labs on Admission: I have personally reviewed following labs and imaging studies  CBC: Recent Labs  Lab 05/21/24 1259  WBC 8.3  NEUTROABS 5.2  HGB 9.7*  HCT 32.8*  MCV 96.8  PLT 115*   Basic Metabolic Panel: Recent Labs  Lab 05/21/24 1259  NA 148*  K 3.9  CL 104  CO2 32  GLUCOSE 86  BUN 29*  CREATININE 2.14*  CALCIUM  9.5   GFR: Estimated Creatinine Clearance: 27 mL/min (A) (by C-G formula based on SCr of 2.14 mg/dL (H)). Liver Function Tests: Recent Labs  Lab 05/21/24 1259  AST 28  ALT 15  ALKPHOS 55  BILITOT 0.5  PROT 7.7  ALBUMIN 4.1   No results for input(s): LIPASE, AMYLASE in the last 168 hours. Recent Labs  Lab 05/21/24 1607  AMMONIA 23   Coagulation Profile: No results for input(s): INR, PROTIME in the last 168 hours. Cardiac Enzymes: No results for input(s): CKTOTAL, CKMB, CKMBINDEX, TROPONINI, TROPONINIHS in  the last 168 hours. BNP (last 3 results) Recent Labs    02/01/24 1035  BNP 196.8*   HbA1C: No results for input(s): HGBA1C in the last 72 hours. CBG: No results for input(s): GLUCAP in the last 168 hours. Lipid Profile: No results for input(s): CHOL, HDL, LDLCALC, TRIG, CHOLHDL, LDLDIRECT in the last 72 hours. Thyroid  Function Tests: No results for input(s): TSH, T4TOTAL, FREET4, T3FREE, THYROIDAB in the last 72 hours. Anemia Panel: No results for input(s): VITAMINB12, FOLATE, FERRITIN, TIBC, IRON , RETICCTPCT in the last 72 hours. Urine analysis:    Component Value Date/Time   COLORURINE YELLOW (A) 05/21/2024 1607   APPEARANCEUR HAZY (A) 05/21/2024 1607   APPEARANCEUR Clear 11/02/2013 0205   LABSPEC 1.015 05/21/2024 1607   LABSPEC 1.005 11/02/2013 0205   PHURINE  5.0 05/21/2024 1607   GLUCOSEU 150 (A) 05/21/2024 1607   GLUCOSEU Negative 11/02/2013 0205   HGBUR SMALL (A) 05/21/2024 1607   BILIRUBINUR NEGATIVE 05/21/2024 1607   BILIRUBINUR Negative 11/02/2013 0205   KETONESUR NEGATIVE 05/21/2024 1607   PROTEINUR 100 (A) 05/21/2024 1607   NITRITE NEGATIVE 05/21/2024 1607   LEUKOCYTESUR NEGATIVE 05/21/2024 1607   LEUKOCYTESUR Negative 11/02/2013 0205    Radiological Exams on Admission: I have personally reviewed images DG Chest Portable 1 View Result Date: 05/21/2024 CLINICAL DATA:  Weakness EXAM: PORTABLE CHEST 1 VIEW COMPARISON:  02/01/2024 FINDINGS: Two frontal views of the chest demonstrates stable enlargement of the cardiac silhouette. There are bibasilar ground-glass opacities, right greater than left, which could reflect infection or aspiration. Trace left pleural effusion. No pneumothorax. No acute bony abnormalities. IMPRESSION: 1. Bibasilar ground-glass airspace disease, which may reflect bibasilar pneumonia or aspiration. 2. Trace left pleural effusion. Electronically Signed   By: Ozell Daring M.D.   On: 05/21/2024 16:43    EKG:  My personal interpretation of EKG shows: Normal sinus rhythm without any acute ST changes fascicular block present.    Assessment/Plan Principal Problem:   Failure to thrive in adult Active Problems:   COPD (chronic obstructive pulmonary disease) (HCC)   Pancreatic mass   Essential hypertension   Hyperlipidemia   GERD (gastroesophageal reflux disease)   Chronic respiratory failure with hypoxia and hypercapnia (HCC)   Chronic diastolic CHF (congestive heart failure) (HCC)   Patient with prolonged recent hospitalization for weakness who presented again for weakness and family stating they are unable to take care of him.  No exact etiology of his weakness is apparent as he has 5 out of 5 strength in all extremities.  Deconditioning could be playing a role but patient states this is more of an acute weakness.  Dehydration could be playing a role as well.  Family state that is not the case and he has been N/V over the last 2 weeks and mostly bedbound.  Patient may need PT and OT that has been ordered.  Family is requesting SNF placement as they are unable to take care of the patient at home.  Will get CK level.  Patient does have chronic hypercapnia which can indirectly lead to weakness or his BiPAP to reverse that (see below).  COPD: Does not appear to be in exacerbation.  Will place on nebulizers.  Hypernatermia: Will give lactated Ringer's and trend sodium.  Once patient has been rehydrated can give dextrose  to correct free water deficit.  CHF: Appears hypervolemic on exam.  Lasix  held during last admission.  Hyperlipidemia: Continue home medicine  Hypertension: Amlodipine  and hydralazine  stopped last admission.  Will continue to monitor and restart hydralazine  if needed  Normocytic anemia: Hemoglobin is improved from baseline.  Continue to monitor.  Thrombocytopenia: Chronic and stable.  IPF and smear ordered.   VTE prophylaxis:  SQ Heparin   Diet: N.p.o. Code Status:  Full  Code Telemetry:  Admission status: Observation, Progressive Patient is from: Home Anticipated d/c is to: Home Anticipated d/c is in: 1-2 days   Family Communication: Updated at bedside  Consults called: None   Severity of Illness: The appropriate patient status for this patient is OBSERVATION. Observation status is judged to be reasonable and necessary in order to provide the required intensity of service to ensure the patient's safety. The patient's presenting symptoms, physical exam findings, and initial radiographic and laboratory data in the context of their medical condition is felt to place them  at decreased risk for further clinical deterioration. Furthermore, it is anticipated that the patient will be medically stable for discharge from the hospital within 2 midnights of admission.    Morene Bathe, MD [facility] DEL. [facility]       [1] No Known Allergies "

## 2024-05-21 NOTE — ED Notes (Signed)
RT notified of Bi-Pap order.

## 2024-05-21 NOTE — ED Notes (Signed)
 Patient asking for a snack; per EDP, Fun[place], advised to hold off w/ food until she spoke w/ Hospitalist.  Patient made aware.

## 2024-05-21 NOTE — ED Notes (Signed)
 EDP, Funke at bedside.

## 2024-05-22 DIAGNOSIS — Z87440 Personal history of urinary (tract) infections: Secondary | ICD-10-CM | POA: Diagnosis not present

## 2024-05-22 DIAGNOSIS — I5032 Chronic diastolic (congestive) heart failure: Secondary | ICD-10-CM

## 2024-05-22 DIAGNOSIS — E860 Dehydration: Secondary | ICD-10-CM | POA: Diagnosis present

## 2024-05-22 DIAGNOSIS — K8689 Other specified diseases of pancreas: Secondary | ICD-10-CM

## 2024-05-22 DIAGNOSIS — I130 Hypertensive heart and chronic kidney disease with heart failure and stage 1 through stage 4 chronic kidney disease, or unspecified chronic kidney disease: Secondary | ICD-10-CM | POA: Diagnosis present

## 2024-05-22 DIAGNOSIS — Z87891 Personal history of nicotine dependence: Secondary | ICD-10-CM | POA: Diagnosis not present

## 2024-05-22 DIAGNOSIS — N1832 Chronic kidney disease, stage 3b: Secondary | ICD-10-CM | POA: Diagnosis present

## 2024-05-22 DIAGNOSIS — G40909 Epilepsy, unspecified, not intractable, without status epilepticus: Secondary | ICD-10-CM | POA: Diagnosis present

## 2024-05-22 DIAGNOSIS — Z681 Body mass index (BMI) 19 or less, adult: Secondary | ICD-10-CM | POA: Diagnosis not present

## 2024-05-22 DIAGNOSIS — J9611 Chronic respiratory failure with hypoxia: Secondary | ICD-10-CM | POA: Diagnosis not present

## 2024-05-22 DIAGNOSIS — E43 Unspecified severe protein-calorie malnutrition: Secondary | ICD-10-CM | POA: Diagnosis present

## 2024-05-22 DIAGNOSIS — J9612 Chronic respiratory failure with hypercapnia: Secondary | ICD-10-CM | POA: Diagnosis not present

## 2024-05-22 DIAGNOSIS — R64 Cachexia: Secondary | ICD-10-CM | POA: Diagnosis present

## 2024-05-22 DIAGNOSIS — J9621 Acute and chronic respiratory failure with hypoxia: Secondary | ICD-10-CM | POA: Diagnosis present

## 2024-05-22 DIAGNOSIS — J449 Chronic obstructive pulmonary disease, unspecified: Secondary | ICD-10-CM

## 2024-05-22 DIAGNOSIS — E785 Hyperlipidemia, unspecified: Secondary | ICD-10-CM | POA: Diagnosis present

## 2024-05-22 DIAGNOSIS — R262 Difficulty in walking, not elsewhere classified: Secondary | ICD-10-CM | POA: Diagnosis present

## 2024-05-22 DIAGNOSIS — M6281 Muscle weakness (generalized): Secondary | ICD-10-CM | POA: Diagnosis present

## 2024-05-22 DIAGNOSIS — R5381 Other malaise: Secondary | ICD-10-CM | POA: Diagnosis not present

## 2024-05-22 DIAGNOSIS — R627 Adult failure to thrive: Secondary | ICD-10-CM | POA: Diagnosis present

## 2024-05-22 DIAGNOSIS — E871 Hypo-osmolality and hyponatremia: Secondary | ICD-10-CM | POA: Diagnosis present

## 2024-05-22 DIAGNOSIS — K219 Gastro-esophageal reflux disease without esophagitis: Secondary | ICD-10-CM | POA: Diagnosis present

## 2024-05-22 DIAGNOSIS — J9622 Acute and chronic respiratory failure with hypercapnia: Secondary | ICD-10-CM | POA: Diagnosis present

## 2024-05-22 DIAGNOSIS — E870 Hyperosmolality and hypernatremia: Secondary | ICD-10-CM | POA: Diagnosis present

## 2024-05-22 DIAGNOSIS — D631 Anemia in chronic kidney disease: Secondary | ICD-10-CM | POA: Diagnosis present

## 2024-05-22 DIAGNOSIS — D696 Thrombocytopenia, unspecified: Secondary | ICD-10-CM | POA: Diagnosis present

## 2024-05-22 DIAGNOSIS — N179 Acute kidney failure, unspecified: Secondary | ICD-10-CM | POA: Diagnosis present

## 2024-05-22 DIAGNOSIS — R531 Weakness: Secondary | ICD-10-CM | POA: Diagnosis present

## 2024-05-22 DIAGNOSIS — E861 Hypovolemia: Secondary | ICD-10-CM | POA: Diagnosis present

## 2024-05-22 LAB — BASIC METABOLIC PANEL WITH GFR
Anion gap: 6 (ref 5–15)
BUN: 32 mg/dL — ABNORMAL HIGH (ref 8–23)
CO2: 33 mmol/L — ABNORMAL HIGH (ref 22–32)
Calcium: 8.3 mg/dL — ABNORMAL LOW (ref 8.9–10.3)
Chloride: 105 mmol/L (ref 98–111)
Creatinine, Ser: 1.99 mg/dL — ABNORMAL HIGH (ref 0.61–1.24)
GFR, Estimated: 37 mL/min — ABNORMAL LOW
Glucose, Bld: 95 mg/dL (ref 70–99)
Potassium: 3.5 mmol/L (ref 3.5–5.1)
Sodium: 144 mmol/L (ref 135–145)

## 2024-05-22 LAB — TSH: TSH: 1.55 u[IU]/mL (ref 0.350–4.500)

## 2024-05-22 LAB — CBC
HCT: 27 % — ABNORMAL LOW (ref 39.0–52.0)
Hemoglobin: 8.1 g/dL — ABNORMAL LOW (ref 13.0–17.0)
MCH: 28.8 pg (ref 26.0–34.0)
MCHC: 30 g/dL (ref 30.0–36.0)
MCV: 96.1 fL (ref 80.0–100.0)
Platelets: 115 K/uL — ABNORMAL LOW (ref 150–400)
RBC: 2.81 MIL/uL — ABNORMAL LOW (ref 4.22–5.81)
RDW: 14 % (ref 11.5–15.5)
WBC: 6.8 K/uL (ref 4.0–10.5)
nRBC: 0 % (ref 0.0–0.2)

## 2024-05-22 LAB — CK: Total CK: 153 U/L (ref 49–397)

## 2024-05-22 LAB — MAGNESIUM
Magnesium: 1.8 mg/dL (ref 1.7–2.4)
Magnesium: 1.6 mg/dL — ABNORMAL LOW (ref 1.7–2.4)

## 2024-05-22 LAB — TECHNOLOGIST SMEAR REVIEW: Plt Morphology: NORMAL

## 2024-05-22 LAB — IMMATURE PLATELET FRACTION: Immature Platelet Fraction: 8.3 % (ref 1.2–8.6)

## 2024-05-22 LAB — CBG MONITORING, ED: Glucose-Capillary: 70 mg/dL (ref 70–99)

## 2024-05-22 MED ORDER — FOLIC ACID 1 MG PO TABS
1.0000 mg | ORAL_TABLET | Freq: Every day | ORAL | Status: DC
  Administered 2024-05-22 – 2024-05-26 (×5): 1 mg via ORAL
  Filled 2024-05-22 (×6): qty 1

## 2024-05-22 MED ORDER — IPRATROPIUM-ALBUTEROL 0.5-2.5 (3) MG/3ML IN SOLN
3.0000 mL | Freq: Four times a day (QID) | RESPIRATORY_TRACT | Status: DC | PRN
  Administered 2024-05-25: 3 mL via RESPIRATORY_TRACT
  Filled 2024-05-22: qty 3

## 2024-05-22 MED ORDER — MAGNESIUM SULFATE 2 GM/50ML IV SOLN
2.0000 g | Freq: Once | INTRAVENOUS | Status: AC
  Administered 2024-05-22: 2 g via INTRAVENOUS
  Filled 2024-05-22: qty 50

## 2024-05-22 MED ORDER — LACTATED RINGERS IV SOLN: INTRAVENOUS | Status: AC

## 2024-05-22 MED ORDER — FERROUS SULFATE 325 (65 FE) MG PO TABS
325.0000 mg | ORAL_TABLET | Freq: Every day | ORAL | Status: DC
  Administered 2024-05-22 – 2024-05-26 (×5): 325 mg via ORAL
  Filled 2024-05-22 (×6): qty 1

## 2024-05-22 MED ORDER — PANTOPRAZOLE SODIUM 40 MG PO TBEC
40.0000 mg | DELAYED_RELEASE_TABLET | Freq: Every day | ORAL | Status: DC
  Administered 2024-05-22 – 2024-05-26 (×5): 40 mg via ORAL
  Filled 2024-05-22 (×6): qty 1

## 2024-05-22 MED ORDER — LEVETIRACETAM 750 MG PO TABS
750.0000 mg | ORAL_TABLET | Freq: Two times a day (BID) | ORAL | Status: DC
  Administered 2024-05-22 – 2024-05-26 (×9): 750 mg via ORAL
  Filled 2024-05-22 (×10): qty 1

## 2024-05-22 MED ORDER — DAPAGLIFLOZIN PROPANEDIOL 10 MG PO TABS
10.0000 mg | ORAL_TABLET | Freq: Every day | ORAL | Status: DC
  Administered 2024-05-23 – 2024-05-26 (×4): 10 mg via ORAL
  Filled 2024-05-22 (×4): qty 1

## 2024-05-22 MED ORDER — TRAZODONE HCL 50 MG PO TABS
50.0000 mg | ORAL_TABLET | Freq: Every day | ORAL | Status: DC
  Administered 2024-05-22 – 2024-05-25 (×4): 50 mg via ORAL
  Filled 2024-05-22 (×4): qty 1

## 2024-05-22 MED ORDER — ATORVASTATIN CALCIUM 20 MG PO TABS
20.0000 mg | ORAL_TABLET | Freq: Every evening | ORAL | Status: DC
  Administered 2024-05-23 – 2024-05-25 (×3): 20 mg via ORAL
  Filled 2024-05-22 (×3): qty 1

## 2024-05-22 MED ORDER — BISOPROLOL FUMARATE 5 MG PO TABS
2.5000 mg | ORAL_TABLET | Freq: Every day | ORAL | Status: DC
  Administered 2024-05-23 – 2024-05-25 (×3): 2.5 mg via ORAL
  Filled 2024-05-22 (×4): qty 0.5

## 2024-05-22 NOTE — ED Notes (Signed)
 Pt non compliant with BiPAP. Pt back on 3L [place].

## 2024-05-22 NOTE — ED Notes (Signed)
 Family at bedside, pt denies any further needs at this time, sister states that she would prefer the pt go to rehab prior to discharge to get his strength up before going home

## 2024-05-22 NOTE — Evaluation (Signed)
 Physical Therapy Evaluation Patient Details Name: Darin Hopkins MRN: [id number] DOB: 01-24-1961 Today's Date: 05/22/2024  History of Present Illness  Pt is a 64 y/o M admitted on 05/21/24 after presenting with c/o generalized weakness. Pt is being treated for failure to thrive. PMH: HTN, HLD, chronic respiratory failure with hypoxia & hypercapnia 2/2 COPD, CHF, seizures  Clinical Impression  Pt seen for PT evaluation with pt agreeable, family present but stepping out upon PT arrival. Prior to admission pt was living with sister & brother in law in mobile home, ambulating with RW. On this date, pt is able to come to sitting EOB for ~3 minutes with cuing to correct posterior lean at times. Pt politely declined standing despite encouragement, did note feeling winded after sitting EOB but SpO2 96% or > on 3L/min. Recommend ongoing PT services to progress mobility & reduce fall risk as able.        If plan is discharge home, recommend the following: A lot of help with walking and/or transfers;A lot of help with bathing/dressing/bathroom;Assistance with cooking/housework;Assist for transportation;Help with stairs or ramp for entrance   Can travel by private vehicle   No    Equipment Recommendations None recommended by PT (defer to next venue)  Recommendations for Other Services       Functional Status Assessment Patient has had a recent decline in their functional status and demonstrates the ability to make significant improvements in function in a reasonable and predictable amount of time.     Precautions / Restrictions Precautions Precautions: Fall Restrictions Weight Bearing Restrictions Per Provider Order: No      Mobility  Bed Mobility Overal bed mobility: Needs Assistance Bed Mobility: Supine to Sit, Sit to Supine     Supine to sit: Supervision, HOB elevated Sit to supine: Supervision, HOB elevated        Transfers Overall transfer level:  (pt declines)                       Ambulation/Gait                  Stairs            Wheelchair Mobility     Tilt Bed    Modified Rankin (Stroke Patients Only)       Balance Overall balance assessment: Needs assistance Sitting-balance support: Feet unsupported, Bilateral upper extremity supported Sitting balance-Leahy Scale: Good Sitting balance - Comments: posterior lean, cuing multiple times to correct but no overt LOB                                     Pertinent Vitals/Pain Pain Assessment Pain Assessment: No/denies pain    Home Living Family/patient expects to be discharged to:: Private residence Living Arrangements: Other relatives Available Help at Discharge: Family;Available PRN/intermittently Type of Home: Mobile home Home Access: Stairs to enter   Entrance Stairs-Number of Steps: 1   Home Layout: One level Home Equipment: [employer] (2 wheels);Wheelchair - manual;BSC/3in1      Prior Function Prior Level of Function : Needs assist;Patient poor historian/Family not available             Mobility Comments: uses walker at baseline ADLs Comments: sedentary, minimal particpiation with ADLs     Extremity/Trunk Assessment   Upper Extremity Assessment Upper Extremity Assessment: Generalized weakness    Lower Extremity Assessment Lower Extremity Assessment: Generalized weakness  Cervical / Trunk Assessment Cervical / Trunk Assessment: Kyphotic (temple hallowing)  Communication   Communication Communication: Impaired Factors Affecting Communication: Hearing impaired    Cognition Arousal: Alert Behavior During Therapy: WFL for tasks assessed/performed   PT - Cognitive impairments: Safety/Judgement                         Following commands: Intact       Cueing Cueing Techniques: Verbal cues     General Comments General comments (skin integrity, edema, etc.): Pt on 3L/min throughout session, reports feeling  winded after sitting EOB with SPO2 as low as 96%, cuing re: pursed lip breathing.    Exercises     Assessment/Plan    PT Assessment Patient needs continued PT services  PT Problem List Decreased strength;Cardiopulmonary status limiting activity;Decreased range of motion;Decreased activity tolerance;Decreased balance;Decreased mobility;Decreased knowledge of precautions;Decreased safety awareness;Decreased knowledge of use of DME       PT Treatment Interventions Therapeutic exercise;DME instruction;Gait training;Balance training;Stair training;Neuromuscular re-education;Therapeutic activities;Patient/family education;Cognitive remediation;Functional mobility training    PT Goals (Current goals can be found in the Care Plan section)  Acute Rehab PT Goals Patient Stated Goal: get better PT Goal Formulation: With patient Time For Goal Achievement: 06/05/24 Potential to Achieve Goals: Fair    Frequency Min 2X/week     Co-evaluation               AM-PAC PT 6 Clicks Mobility  Outcome Measure Help needed turning from your back to your side while in a flat bed without using bedrails?: None Help needed moving from lying on your back to sitting on the side of a flat bed without using bedrails?: A Little Help needed moving to and from a bed to a chair (including a wheelchair)?: A Little Help needed standing up from a chair using your arms (e.g., wheelchair or bedside chair)?: A Little Help needed to walk in hospital room?: A Lot Help needed climbing 3-5 steps with a railing? : Total 6 Click Score: 16    End of Session   Activity Tolerance: Patient limited by fatigue Patient left: in bed;with call bell/phone within reach;with bed alarm set Nurse Communication: Mobility status PT Visit Diagnosis: Other abnormalities of gait and mobility (R26.89);Difficulty in walking, not elsewhere classified (R26.2);Muscle weakness (generalized) (M62.81)    Time: 8875-8865 PT Time  Calculation (min) (ACUTE ONLY): 10 min   Charges:   PT Evaluation $PT Eval Low Complexity: 1 Low   PT General Charges $$ ACUTE PT VISIT: 1 Visit         Richerd Pinal, PT, DPT 05/22/2024, 12:38 PM   Richerd CHRISTELLA Pinal 05/22/2024, 12:37 PM

## 2024-05-22 NOTE — ED Notes (Signed)
 Pt given food in ED

## 2024-05-22 NOTE — ED Notes (Signed)
 CBG-70

## 2024-05-22 NOTE — Hospital Course (Signed)
 64 y.o. year old male with medical history of hypertension, hyperlipidemia, chronic respiratory failure with hypoxia and hypercapnia secondary to COPD, CHF presenting to the ED with generalized weakness.     Assessment and Plan:   Acute on chronic hypoxic hypercapnic respiratory failure - VBG on presentation 7.28/83/38.  Patient does not have CPAP at home or is not compliant reportedly.  Responded well to BiPAP although again noncompliant and refusing.  Will lower O2 sat goal to 88-92.   COPD - Does not appear to be in acute exacerbation.  No active wheezing appreciated.  Patient states he is not short of breath.  Continue nebulizers.   Acute kidney injury on CKD 3B - Last known creatinine baseline around 1.7.  Creatinine 2.14 on presentation.  Likely volume depletion.  Showing mild improvement after IV fluid hydration.   Mild hyponatremia - Likely secondary to volume depletion.  Resolved after IV fluid hydration.   Chronic HFrEF - Hypovolemic on presentation.   Hypertension - Hydralazine  as needed.  No antihypertensives on board.   Pancreatic mass -Patient was to have outpatient evaluation for this. He had an appointment with Dr. Wilhelmenia that appears to have been canceled. CA 19 9 was measured last admission and was slightly above upper limit of normal.  GI consulted and will heed their recommendations.   Physical debilitation and muscle weakness/severe protein calorie malnutrition - Patient very cachectic, low p.o. intake, possible failure to thrive.  Will order PT/OT to eval.

## 2024-05-22 NOTE — Evaluation (Signed)
 Occupational Therapy Evaluation Patient Details Name: Darin Hopkins MRN: [id number] DOB: 03-16-1961 Today's Date: 05/22/2024   History of Present Illness   Pt is a 64 y.o. year old male presenting to the ED with generalized weakness, inability to ambulate/legs giving out. Current MD assessment: failure to thrive in adult. PMH of hypertension, hyperlipidemia, chronic respiratory failure with hypoxia and hypercapnia secondary to COPD, CHF     Clinical Impressions Pt was seen for OT evaluation this date. PTA, pt resides at home with family support for most tasks, he is typically ambulatory with a RW. Family is reporting they are no longer able to care for him. Pt presents with deficits in strength, balance, activity tolerance/cardiopulmonary status limiting their ability to perform ADL management at baseline level. Pt currently requires supervision for bed mobility and Min A for transfers and mobility to take a few steps to toilet and back using RW with frequent cues for technique and RW management assist. Noted with forward flexed posture and cues for RW proximity. Total A for clothing management. Pt stable sp02 on 3L at rest, desat to 60% with activity requiring 9L, PLB and increased time to recover to 90s with ability to wean back to 3L at 100% sp02 by end of session.  Pt would benefit from skilled OT services to address noted impairments and functional limitations to maximize safety and independence while minimizing future risk of falls, injury, and readmission. Do anticipate the need for follow up OT services upon acute hospital DC.      If plan is discharge home, recommend the following:   A lot of help with bathing/dressing/bathroom;A little help with walking and/or transfers;A lot of help with walking and/or transfers;Help with stairs or ramp for entrance     Functional Status Assessment   Patient has had a recent decline in their functional status and demonstrates the ability to make  significant improvements in function in a reasonable and predictable amount of time.     Equipment Recommendations   Other (comment) (defer to next  venue)     Recommendations for Other Services         Precautions/Restrictions   Precautions Precautions: Fall Recall of Precautions/Restrictions: Intact Restrictions Weight Bearing Restrictions Per Provider Order: No     Mobility Bed Mobility Overal bed mobility: Needs Assistance Bed Mobility: Supine to Sit, Sit to Supine     Supine to sit: Supervision Sit to supine: Supervision        Transfers Overall transfer level: Needs assistance Equipment used: Rolling walker (2 wheels) Transfers: Sit to/from Stand Sit to Stand: Min assist           General transfer comment: frequent cueing for technique/safety and assist with RW management, kyphotic posture noted and fatigues with minimal activity; constant hands on assist to maintain balance      Balance Overall balance assessment: Needs assistance Sitting-balance support: Feet supported Sitting balance-Leahy Scale: Good     Standing balance support: During functional activity, Reliant on assistive device for balance, Bilateral upper extremity supported Standing balance-Leahy Scale: Poor Standing balance comment: RW +Min A                           ADL either performed or assessed with clinical judgement   ADL Overall ADL's : Needs assistance/impaired                     Lower Body Dressing: Total assistance;Bed level Lower  Body Dressing Details (indicate cue type and reason): donn new brief Toilet Transfer: Minimal assistance;Moderate assistance;Rolling walker (2 wheels);Regular Toilet;Ambulation Toilet Transfer Details (indicate cue type and reason): step turn from ED gurney to commode Toileting- Clothing Manipulation and Hygiene: Maximal assistance;Sit to/from stand Toileting - Clothing Manipulation Details (indicate cue type and  reason): manage brief     Functional mobility during ADLs: Minimal assistance;Rolling walker (2 wheels);Moderate assistance       Vision         Perception         Praxis         Pertinent Vitals/Pain Pain Assessment Pain Assessment: No/denies pain     Extremity/Trunk Assessment Upper Extremity Assessment Upper Extremity Assessment: Generalized weakness   Lower Extremity Assessment Lower Extremity Assessment: Generalized weakness   Cervical / Trunk Assessment Cervical / Trunk Assessment: Kyphotic   Communication Communication Communication: Impaired Factors Affecting Communication: Hearing impaired   Cognition Arousal: Alert Behavior During Therapy: WFL for tasks assessed/performed                                 Following commands: Intact       Cueing  General Comments      stable 02 on 3L at rest, desat to 60% requiring 9L and PLB to improve to 100% with weaning back to 3L-nurse and MD made aware; pt admitted to mild DOE, but still able to talk   Exercises Other Exercises Other Exercises: Edu on role of OT in acute setting.   Shoulder Instructions      Home Living Family/patient expects to be discharged to:: Private residence Living Arrangements: Other relatives Available Help at Discharge: Family Type of Home: Mobile home Home Access: Stairs to enter Entrance Stairs-Number of Steps: 1   Home Layout: One level     Bathroom Shower/Tub: Sponge bathes at baseline   Bathroom Toilet: Standard Bathroom Accessibility: No   Home Equipment: Agricultural Consultant (2 wheels);Wheelchair - manual;BSC/3in1          Prior Functioning/Environment Prior Level of Function : Needs assist;Patient poor historian/Family not available             Mobility Comments: uses walker at baseline ADLs Comments: sedentary, minimal particpiation with ADLs    OT Problem List: Decreased strength;Decreased activity tolerance;Impaired balance (sitting  and/or standing)   OT Treatment/Interventions: Therapeutic exercise;Therapeutic activities;Self-care/ADL training;Energy conservation;DME and/or AE instruction;Patient/family education;Balance training      OT Goals(Current goals can be found in the care plan section)   Acute Rehab OT Goals Patient Stated Goal: get stronger OT Goal Formulation: With patient Time For Goal Achievement: 06/05/24 Potential to Achieve Goals: Good ADL Goals Pt Will Perform Grooming: with set-up;standing Pt Will Perform Lower Body Dressing: with min assist;sitting/lateral leans;sit to/from stand Pt Will Transfer to Toilet: with supervision;with contact guard assist;ambulating   OT Frequency:  Min 2X/week    Co-evaluation              AM-PAC OT 6 Clicks Daily Activity     Outcome Measure Help from another person eating meals?: A Little Help from another person taking care of personal grooming?: A Little Help from another person toileting, which includes using toliet, bedpan, or urinal?: A Lot Help from another person bathing (including washing, rinsing, drying)?: A Lot Help from another person to put on and taking off regular upper body clothing?: A Little Help from another person to put on and  taking off regular lower body clothing?: A Lot 6 Click Score: 15   End of Session Equipment Utilized During Treatment: Gait belt;Rolling walker (2 wheels);Oxygen Nurse Communication: Mobility status  Activity Tolerance: Patient tolerated treatment well Patient left: in bed;with call bell/phone within reach  OT Visit Diagnosis: Other abnormalities of gait and mobility (R26.89);Muscle weakness (generalized) (M62.81);Unsteadiness on feet (R26.81)                Time: 9081-9051 OT Time Calculation (min): 30 min Charges:  OT General Charges $OT Visit: 1 Visit OT Evaluation $OT Eval Moderate Complexity: 1 Mod OT Treatments $Self Care/Home Management : 8-22 mins  Kyasia Steuck Chrismon, OTR/L 05/22/2024,  10:37 AM  Jenna Routzahn E Chrismon 05/22/2024, 10:32 AM

## 2024-05-22 NOTE — Progress Notes (Signed)
" ° °  Brief Progress Note   _____________________________________________________________________________________________________________  Patient Name: Darin Hopkins Patient DOB: 24-Nov-1960 Date: @TODAY @      Data: Reviewed labs, notes, VS.    Action: No action needed at this time.     Response:    _____________________________________________________________________________________________________________  [facility] RN Expeditor Sharolyn JONETTA Batman Please contact us  directly via secure chat (search for [facility]) or by calling us  at [phone] [facility]).  "

## 2024-05-22 NOTE — Consult Note (Signed)
 "      Rogelia Copping, MD [facility]  [address] [place], [place] [address] Phone: [phone] Fax : [phone]  Consultation  Referring Provider:     Dr. Fernand Primary Care Physician:  [name], NP Primary Gastroenterologist: Sampson         Reason for Consultation:     Options for diagnosing pancreatic cancer in a patient with a pancreatic mass  Date of Admission:  05/21/2024 Date of Consultation:  05/22/2024         HPI:   Darin Hopkins is a 64 y.o. 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  It was reported the patient had been feeling weak over the last few days.  There was also reported that the weakness had gotten worse the day of admission.  He was previously able to walk around an hour he has deteriorated with the inability to walk around prior to admission.  The patient had labs that showed him to have thrombocytopenia at 117 with his creatinine being elevated at 2.14.  The patient's liver function tests were normal.  The patient previously had a MRI of the abdomen back in November which showed:  IMPRESSION: Motion degraded exam.  Moderate dilatation of the main pancreatic duct, with stricture in the pancreatic body and subtle soft tissue prominence suspicious for a small pancreatic mass. Recommend GI consultation for consideration of EUS/FNA.  2.6 cm complex subcapsular lesion in the right kidney is considered an indeterminate Bosniak category 2 F lesion. Recommend continued follow-up by renal protocol abdomen CT without and with contrast in 6 months.   Left lower lobe airspace disease, suspicious for pneumonia.  It was reported that the patient was set up for an endoscopic ultrasound and the patient was offered December 29 of last year.  From reviewing the notes it appears that the patient was not given that spot and  subsequently that space was no longer available as per Dr. Melba note.  It was recommended that the patient be sent to another center due to the unavailability of an EUS by Dr. Wilhelmenia.  I am now being consulted for options to get an endoscopic ultrasound on this patient.  Past Medical History:  Diagnosis Date   CHF (congestive heart failure) (HCC)    COPD (chronic obstructive pulmonary disease) (HCC)    Hypertension    Seizures (HCC)     History reviewed. No pertinent surgical history.  Prior to Admission medications  Medication Sig Start Date End Date Taking? Authorizing Provider  acetaminophen  (TYLENOL ) 325 MG tablet Take 2 tablets (650 mg total) by mouth every 6 (six) hours as needed for mild pain (pain score 1-3) or fever (or Fever >/= 101). 04/12/24  Yes Wieting, Richard, MD  atorvastatin  (LIPITOR) 20 MG tablet Take 1 tablet (20 mg total) by mouth every evening. 04/16/24  Yes Wieting, Richard, MD  bisoprolol  2.5 MG TABS Take 2.5 mg by mouth at bedtime. 04/16/24  Yes Wieting, Richard, MD  cholecalciferol (VITAMIN D3) 25 MCG (1000 UNIT) tablet Take 1 tablet (1,000 Units total) by mouth daily. 04/16/24  Yes Josette Ade, MD  FARXIGA  10 MG TABS tablet Take 1 tablet (10 mg total) by mouth daily before breakfast. 04/16/24  Yes Josette Ade, MD  FEROSUL 325 (65 Fe) MG tablet Take 1 tablet (325 mg total) by mouth daily with breakfast. 04/16/24  Yes Wieting,  Richard, MD  folic acid  (FOLVITE ) 1 MG tablet Take 1 tablet (1 mg total) by mouth daily. 04/16/24  Yes Wieting, Richard, MD  levETIRAcetam  (KEPPRA ) 750 MG tablet Take 1 tablet (750 mg total) by mouth 2 (two) times daily. 04/16/24  Yes Wieting, Richard, MD  Multiple Vitamin (MULTIVITAMIN ADULT) TABS Take 2 tablets by mouth daily. 04/16/24  Yes Josette Ade, MD  omeprazole  (PRILOSEC) 20 MG capsule Take 1 capsule (20 mg total) by mouth daily before breakfast. 04/16/24  Yes Josette Ade, MD  PROAIR  HFA 108 (90 Base) MCG/ACT  inhaler Inhale 2 puffs into the lungs every 6 (six) hours as needed for shortness of breath or wheezing. 04/16/24  Yes Wieting, Richard, MD  sodium zirconium cyclosilicate  (LOKELMA ) 10 g PACK packet Take 10 g by mouth daily. 04/16/24  Yes Wieting, Richard, MD  thiamine  (VITAMIN B1) 100 MG tablet Take 1 tablet (100 mg total) by mouth daily. 04/16/24  Yes Wieting, Richard, MD  Tiotropium Bromide -Olodaterol (STIOLTO RESPIMAT ) 2.5-2.5 MCG/ACT AERS Inhale 2 puffs into the lungs daily. Pt needs to make an appointment to continue to receive refills please. 04/16/24  Yes Wieting, Richard, MD  traZODone  (DESYREL ) 50 MG tablet Take 1 tablet (50 mg total) by mouth at bedtime. 04/16/24  Yes Josette Ade, MD    History reviewed. No pertinent family history.   Social History[1]  Allergies as of 05/21/2024   (No Known Allergies)    Review of Systems:    All systems reviewed and negative except where noted in HPI.   Physical Exam:  Vital signs in last 24 hours: Temp:  [97.4 F (36.3 C)-98.5 F (36.9 C)] 98.1 F (36.7 C) (01/13 0517) Pulse Rate:  [53-71] 60 (01/13 0600) Resp:  [12-20] 16 (01/13 0600) BP: (134-185)/(74-107) 160/89 (01/13 0600) SpO2:  [97 %-100 %] 100 % (01/13 0600) FiO2 (%):  [30 %] 30 % (01/12 1919) Weight:  [54 kg] 54 kg (01/12 1258) Last BM Date : 05/22/24 General:   Pleasant, cooperative in NAD Head:  Normocephalic and atraumatic. Eyes:   No icterus.   Conjunctiva pink. PERRLA. Ears:  Normal auditory acuity. Neck:  Supple; no masses or thyroidomegaly Lungs: Respirations even and unlabored. Lungs clear to auscultation bilaterally.   No wheezes, crackles, or rhonchi.  Heart:  Regular rate and rhythm;  Without murmur, clicks, rubs or gallops Abdomen:  Soft, nondistended, nontender. Normal bowel sounds. No appreciable masses or hepatomegaly.  No rebound or guarding.  Rectal:  Not performed. Msk:  Symmetrical without gross deformities.    Extremities:  Without edema, cyanosis or  clubbing. Neurologic:  Alert and oriented x3;  grossly normal neurologically. Skin:  Intact without significant lesions or rashes. Cervical Nodes:  No significant cervical adenopathy. Psych:  Alert and cooperative. Normal affect.  LAB RESULTS: Recent Labs    05/21/24 1259 05/22/24 0521  WBC 8.3 6.8  HGB 9.7* 8.1*  HCT 32.8* 27.0*  PLT 115* 115*   BMET Recent Labs    05/21/24 1259 05/22/24 0521  NA 148* 144  K 3.9 3.5  CL 104 105  CO2 32 33*  GLUCOSE 86 95  BUN 29* 32*  CREATININE 2.14* 1.99*  CALCIUM  9.5 8.3*   LFT Recent Labs    05/21/24 1259  PROT 7.7  ALBUMIN 4.1  AST 28  ALT 15  ALKPHOS 55  BILITOT 0.5  BILIDIR 0.2  IBILI 0.3   PT/INR No results for input(s): LABPROT, INR in the last 72 hours.  STUDIES: DG Chest Portable 1  View Result Date: 05/21/2024 CLINICAL DATA:  Weakness EXAM: PORTABLE CHEST 1 VIEW COMPARISON:  02/01/2024 FINDINGS: Two frontal views of the chest demonstrates stable enlargement of the cardiac silhouette. There are bibasilar ground-glass opacities, right greater than left, which could reflect infection or aspiration. Trace left pleural effusion. No pneumothorax. No acute bony abnormalities. IMPRESSION: 1. Bibasilar ground-glass airspace disease, which may reflect bibasilar pneumonia or aspiration. 2. Trace left pleural effusion. Electronically Signed   By: Ozell Daring M.D.   On: 05/21/2024 16:43      Impression / Plan:   Assessment: Principal Problem:   Failure to thrive in adult Active Problems:   Essential hypertension   Hyperlipidemia   GERD (gastroesophageal reflux disease)   COPD (chronic obstructive pulmonary disease) (HCC)   Chronic respiratory failure with hypoxia and hypercapnia (HCC)   Pancreatic mass   Chronic diastolic CHF (congestive heart failure) (HCC)   Darin Hopkins is a 64 y.o. y/o male with a small pancreatic mass seen in a patient with worsening weakness over the last few days.  I am being  consulted due to the patient not being able to get an endoscopic ultrasound in a timely fashion due to availability problems over in [place].  Plan:  I am sorry to say that we do not do endoscopic ultrasounds at this hospital.  Due to this lesion being small and reported as a subtle soft tissue prominence a transabdominal CT scan guided biopsy is unlikely to result in a good tissue sample.  I would recommend that the patient have a endoscopic ultrasound with biopsy at a facility that performs these procedures.  If [facility] cannot get the patient in through [facility] then one of the gastroenterologists at [facility] or possibly at [facility] or [facility] could do an EUS and biopsy this lesion.  There is really nothing further to recommend from a GI point of view at this time.  I will sign off.  Please call if any further GI concerns or questions.  We would like to thank you for the opportunity to participate in the care of Darin Hopkins.   Thank you for involving me in the care of this patient.      LOS: 0 days   Rogelia Copping, MD, MD. NOLIA 05/22/2024, 7:30 AM,  Pager [phone] 7am-5pm  Check AMION for 5pm -7am coverage and on weekends   Note: This dictation was prepared with Dragon dictation along with smaller phrase technology. Any transcriptional errors that result from this process are unintentional.       [1]  Social History Tobacco Use   Smoking status: Former    Current packs/day: 0.00    Average packs/day: 2.0 packs/day    Types: Cigarettes    Quit date: 2021    Years since quitting: 5.0   Smokeless tobacco: Never  Vaping Use   Vaping status: Never Used  Substance Use Topics   Alcohol use: Yes    Alcohol/week: 3.0 standard drinks of alcohol    Types: 3 Cans of beer per week    Comment: daily   Drug use: Not Currently   "

## 2024-05-22 NOTE — Progress Notes (Signed)
 " Progress Note   Patient: Darin Hopkins FMW:[id number] DOB: Sep 10, 1960 DOA: 05/21/2024  DOS: the patient was seen and examined on 05/22/2024   Brief hospital course:  65 y.o. year old male with medical history of hypertension, hyperlipidemia, chronic respiratory failure with hypoxia and hypercapnia secondary to COPD, CHF presenting to the ED with generalized weakness.    Assessment and Plan:  Acute on chronic hypoxic hypercapnic respiratory failure - VBG on presentation 7.28/83/38.  Patient does not have CPAP at home or is not compliant reportedly.  Responded well to BiPAP although again noncompliant and refusing.  Will lower O2 sat goal to 88-92.  COPD - Does not appear to be in acute exacerbation.  No active wheezing appreciated.  Patient states he is not short of breath.  Continue nebulizers.  Acute kidney injury on CKD 3B - Last known creatinine baseline around 1.7.  Creatinine 2.14 on presentation.  Likely volume depletion.  Showing mild improvement after IV fluid hydration.  Mild hyponatremia - Likely secondary to volume depletion.  Resolved after IV fluid hydration.  Chronic HFrEF - Hypovolemic on presentation.  Hypertension - Hydralazine  as needed.  No antihypertensives on board.  Pancreatic mass -Patient was to have outpatient evaluation for this. He had an appointment with Dr. Wilhelmenia that appears to have been canceled. CA 19 9 was measured last admission and was slightly above upper limit of normal.  GI consulted and will heed their recommendations.  Physical debilitation and muscle weakness/severe protein calorie malnutrition - Patient very cachectic, low p.o. intake, possible failure to thrive.  Will order PT/OT to eval.  Subjective: Patient resting comfortably this morning.  Denies any worsening shortness of breath, chest pain, nausea, vomiting, abdominal pain.  States he feels pretty well just weak.  Appetite has been poor.  Physical Exam:  Vitals:   05/22/24  0517 05/22/24 0600 05/22/24 1100 05/22/24 1137  BP:  (!) 160/89 (!) 167/89   Pulse:  60 61   Resp:  16 16   Temp: 98.1 F (36.7 C)   98.2 F (36.8 C)  TempSrc: Oral   Oral  SpO2:  100% 100%   Weight:      Height:        GENERAL:  Alert, pleasant, no acute distress, cachectic HEENT:  EOMI, nasal cannula CARDIOVASCULAR:  RRR, no murmurs appreciated RESPIRATORY: Poor air movement bilaterally GASTROINTESTINAL:  Soft, nontender, nondistended EXTREMITIES: Thin, no LE edema bilaterally NEURO:  No new focal deficits appreciated SKIN:  No rashes noted PSYCH:  Appropriate mood and affect     Data Reviewed:  Imaging Studies: DG Chest Portable 1 View Result Date: 05/21/2024 CLINICAL DATA:  Weakness EXAM: PORTABLE CHEST 1 VIEW COMPARISON:  02/01/2024 FINDINGS: Two frontal views of the chest demonstrates stable enlargement of the cardiac silhouette. There are bibasilar ground-glass opacities, right greater than left, which could reflect infection or aspiration. Trace left pleural effusion. No pneumothorax. No acute bony abnormalities. IMPRESSION: 1. Bibasilar ground-glass airspace disease, which may reflect bibasilar pneumonia or aspiration. 2. Trace left pleural effusion. Electronically Signed   By: Ozell Daring M.D.   On: 05/21/2024 16:43    There are no new results to review at this time.  Previous records (including but not limited to H&P, progress notes, nursing notes, TOC management) were reviewed in assessment of this patient.  Labs: CBC: Recent Labs  Lab 05/21/24 1259 05/22/24 0521  WBC 8.3 6.8  NEUTROABS 5.2  --   HGB 9.7* 8.1*  HCT 32.8* 27.0*  MCV 96.8 96.1  PLT 115* 115*   Basic Metabolic Panel: Recent Labs  Lab 05/21/24 1259 05/21/24 1607 05/22/24 0521  NA 148*  --  144  K 3.9  --  3.5  CL 104  --  105  CO2 32  --  33*  GLUCOSE 86  --  95  BUN 29*  --  32*  CREATININE 2.14*  --  1.99*  CALCIUM  9.5  --  8.3*  MG  --  1.8 1.6*   Liver Function  Tests: Recent Labs  Lab 05/21/24 1259  AST 28  ALT 15  ALKPHOS 55  BILITOT 0.5  PROT 7.7  ALBUMIN 4.1   CBG: Recent Labs  Lab 05/22/24 0808  GLUCAP 70    Scheduled Meds:  [START ON 05/23/2024] atorvastatin   20 mg Oral QPM   [START ON 05/23/2024] bisoprolol   2.5 mg Oral QHS   [START ON 05/23/2024] dapagliflozin  propanediol  10 mg Oral QAC breakfast   ferrous sulfate   325 mg Oral Q breakfast   folic acid   1 mg Oral Daily   heparin   5,000 Units Subcutaneous Q8H   levETIRAcetam   750 mg Oral BID   pantoprazole   40 mg Oral Daily   sodium chloride  flush  3 mL Intravenous Q12H   traZODone   50 mg Oral QHS   Continuous Infusions:  lactated ringers  60 mL/hr at 05/22/24 0109   PRN Meds:.acetaminophen  **OR** acetaminophen , ipratropium-albuterol , ondansetron  **OR** ondansetron  (ZOFRAN ) IV, senna-docusate  Family Communication: None at bedside  Disposition: Status is: Observation The patient remains OBS appropriate and will d/c before 2 midnights.     Time spent: 40 minutes  Length of inpatient stay: 0 days  Author: Carliss LELON Canales, DO 05/22/2024 12:40 PM  For on call review [email or web address].   "

## 2024-05-22 NOTE — Progress Notes (Signed)
 Report given to Leotis Bullocks, RN

## 2024-05-22 NOTE — Progress Notes (Signed)
 Report received from Maegan Barnette, RN.

## 2024-05-23 DIAGNOSIS — R627 Adult failure to thrive: Secondary | ICD-10-CM | POA: Diagnosis not present

## 2024-05-23 LAB — BASIC METABOLIC PANEL WITH GFR
Anion gap: 8 (ref 5–15)
BUN: 28 mg/dL — ABNORMAL HIGH (ref 8–23)
CO2: 32 mmol/L (ref 22–32)
Calcium: 8.8 mg/dL — ABNORMAL LOW (ref 8.9–10.3)
Chloride: 102 mmol/L (ref 98–111)
Creatinine, Ser: 2.06 mg/dL — ABNORMAL HIGH (ref 0.61–1.24)
GFR, Estimated: 36 mL/min — ABNORMAL LOW
Glucose, Bld: 87 mg/dL (ref 70–99)
Potassium: 3.5 mmol/L (ref 3.5–5.1)
Sodium: 143 mmol/L (ref 135–145)

## 2024-05-23 LAB — BLOOD GAS, VENOUS
Acid-Base Excess: 6.5 mmol/L — ABNORMAL HIGH (ref 0.0–2.0)
Bicarbonate: 36.3 mmol/L — ABNORMAL HIGH (ref 20.0–28.0)
O2 Saturation: 45.8 %
Patient temperature: 37
pCO2, Ven: 79 mmHg (ref 44–60)
pH, Ven: 7.27 (ref 7.25–7.43)

## 2024-05-23 LAB — CBC
HCT: 33 % — ABNORMAL LOW (ref 39.0–52.0)
Hemoglobin: 10 g/dL — ABNORMAL LOW (ref 13.0–17.0)
MCH: 28.9 pg (ref 26.0–34.0)
MCHC: 30.3 g/dL (ref 30.0–36.0)
MCV: 95.4 fL (ref 80.0–100.0)
Platelets: 134 K/uL — ABNORMAL LOW (ref 150–400)
RBC: 3.46 MIL/uL — ABNORMAL LOW (ref 4.22–5.81)
RDW: 14.1 % (ref 11.5–15.5)
WBC: 7.9 K/uL (ref 4.0–10.5)
nRBC: 0 % (ref 0.0–0.2)

## 2024-05-23 LAB — CBG MONITORING, ED: Glucose-Capillary: 71 mg/dL (ref 70–99)

## 2024-05-23 MED ORDER — LACTATED RINGERS IV SOLN: INTRAVENOUS | Status: AC

## 2024-05-23 MED ORDER — ARFORMOTEROL TARTRATE 15 MCG/2ML IN NEBU
15.0000 ug | INHALATION_SOLUTION | Freq: Two times a day (BID) | RESPIRATORY_TRACT | Status: DC
  Administered 2024-05-23 – 2024-05-25 (×4): 15 ug via RESPIRATORY_TRACT
  Filled 2024-05-23 (×8): qty 2

## 2024-05-23 MED ORDER — UMECLIDINIUM BROMIDE 62.5 MCG/ACT IN AEPB
1.0000 | INHALATION_SPRAY | Freq: Every day | RESPIRATORY_TRACT | Status: DC
  Administered 2024-05-25: 1 via RESPIRATORY_TRACT
  Filled 2024-05-23 (×2): qty 7

## 2024-05-23 NOTE — Progress Notes (Signed)
" ° °  Brief Progress Note   _____________________________________________________________________________________________________________  Patient Name: Darin Hopkins Patient DOB: [birth year] Date: @TODAY @      Data: 64 yo male awaiting admission to a Progressive bed at [facility].    Action: Reached out to Dr. Kandis to inquire about the possibility of downgrading the patient to a telemetry or med-surg level of care.    Response:  Per Dr. Kandis, will assess the patient and make decision whether to downgrade patient.  _____________________________________________________________________________________________________________  [facility] RN Expeditor Brandom Kerwin S Chrys [place] Please contact us  directly via secure chat (search for [facility]) or by calling us  at [phone] [facility]).  "

## 2024-05-23 NOTE — TOC Initial Note (Signed)
 Transition of Care [facility]) - Initial/Assessment Note    Patient Details  Name: Darin Hopkins MRN: [id number] Date of Birth: 06-Mar-1961  Transition of Care [facility]) CM/SW Contact:    Jessi Jessop L Maurisio Ruddy, LCSW Phone Number: 05/23/2024, 1:50 PM  Clinical Narrative:                  [facility] consult received for SNF placement. Family and patient have a history of declining SNF.   CSW spoke with patients niece, Florencia. CSW reviewed the recommendations for SNF. Florencia advised that the family has discussed the recommendations and agree that patient needs to go to SNF. No preference provided for facility. Florencia advised that she would like searches for [facility] however CSW made it understood that due to patients insurance, Medicaid, searches may have to extend outside of [facility]. Florencia advised that she understood.   FL2 will be completed.        Patient Goals and CMS Choice            Expected Discharge Plan and Services                                              Prior Living Arrangements/Services                       Activities of Daily Living      Permission Sought/Granted                  Emotional Assessment              Admission diagnosis:  Failure to thrive (child) [R62.51] Failure to thrive in adult [R62.7] Patient Active Problem List   Diagnosis Date Noted   Failure to thrive in adult 05/21/2024   Iron  deficiency anemia 04/17/2024   Weakness 04/14/2024   Chronic diastolic CHF (congestive heart failure) (HCC) 04/12/2024   Drop in hemoglobin 04/11/2024   Pancreatic mass 04/11/2024   Kidney lesion, native, right 04/11/2024   Hyperkalemia 04/11/2024   Hypernatremia 04/11/2024   Left inguinal hernia 04/03/2024   Chronic respiratory failure with hypoxia and hypercapnia (HCC) 01/16/2021   Acute hypoxemic respiratory failure (HCC) 11/25/2020   Acute kidney injury superimposed on CKD 11/25/2020   Status epilepticus (HCC)  09/09/2020   COPD (chronic obstructive pulmonary disease) (HCC) 08/27/2020   Protein-calorie malnutrition, severe 08/21/2020   Acute hypercapnic respiratory failure (HCC)    Acute decompensated heart failure (HCC) 08/18/2020   Acute exacerbation of CHF (congestive heart failure) (HCC) 08/17/2020   Essential hypertension 08/17/2020   History of seizure 08/17/2020   COPD  GOLD ? spirometry / 02 dep/ hypercarbic  08/17/2020   Hyperlipidemia 08/17/2020   GERD (gastroesophageal reflux disease) 08/17/2020   Alcohol abuse 08/17/2020   Chronic kidney disease, stage III (moderate) (HCC) 10/24/2012   PCP:  [name], NP Pharmacy:   [facility] - [place], [place] - [address] [facility] [address] [place] [address] Phone: [phone] Fax: [phone]     Social Drivers of Health (SDOH) Social History: SDOH Screenings   Food Insecurity: No Food Insecurity (04/03/2024)  Housing: Low Risk (04/03/2024)  Transportation Needs: No Transportation Needs (04/03/2024)  Utilities: Not At Risk (04/03/2024)  Depression (PHQ2-9): Low Risk (02/11/2022)  Tobacco Use: Medium Risk (05/21/2024)   SDOH Interventions:     Readmission Risk Interventions  No data to display

## 2024-05-23 NOTE — NC FL2 (Signed)
 " [place]  MEDICAID FL2 LEVEL OF CARE FORM     IDENTIFICATION  Patient Name: Darin Hopkins Birthdate: 1960-08-24 Sex: male Admission Date (Current Location): 05/21/2024  [facility] and [place] Number:  Chiropodist and Address:  [facility], [address] [place], [place] [address]      Provider Number: [id number]  Attending Physician Name and Address:  Kandis Devaughn Sayres, MD  Relative Name and Phone Number:  SHLOMO RONAL LITTIE  Sister, Emergency Contact  [phone] Florencia Asa [phone]    Current Level of Care: Hospital Recommended Level of Care: Skilled Nursing Facility Prior Approval Number:    Date Approved/Denied:   PASRR Number: [id number]  Discharge Plan: SNF    Current Diagnoses: Patient Active Problem List   Diagnosis Date Noted   Failure to thrive in adult 05/21/2024   Iron  deficiency anemia 04/17/2024   Weakness 04/14/2024   Chronic diastolic CHF (congestive heart failure) (HCC) 04/12/2024   Drop in hemoglobin 04/11/2024   Pancreatic mass 04/11/2024   Kidney lesion, native, right 04/11/2024   Hyperkalemia 04/11/2024   Hypernatremia 04/11/2024   Left inguinal hernia 04/03/2024   Chronic respiratory failure with hypoxia and hypercapnia (HCC) 01/16/2021   Acute hypoxemic respiratory failure (HCC) 11/25/2020   Acute kidney injury superimposed on CKD 11/25/2020   Status epilepticus (HCC) 09/09/2020   COPD (chronic obstructive pulmonary disease) (HCC) 08/27/2020   Protein-calorie malnutrition, severe 08/21/2020   Acute hypercapnic respiratory failure (HCC)    Acute decompensated heart failure (HCC) 08/18/2020   Acute exacerbation of CHF (congestive heart failure) (HCC) 08/17/2020   Essential hypertension 08/17/2020   History of seizure 08/17/2020   COPD  GOLD ? spirometry / 02 dep/ hypercarbic  08/17/2020   Hyperlipidemia 08/17/2020   GERD (gastroesophageal reflux disease) 08/17/2020   Alcohol abuse 08/17/2020    Chronic kidney disease, stage III (moderate) (HCC) 10/24/2012    Orientation RESPIRATION BLADDER Height & Weight     Self, Time, Situation, Place  O2 Incontinent Weight: 119 lb 0.8 oz (54 kg) Height:  5' 7 (170.2 cm)  BEHAVIORAL SYMPTOMS/MOOD NEUROLOGICAL BOWEL NUTRITION STATUS      Incontinent Diet (Diet regular Room service appropriate? Yes; Fluid consistency: Thin: General starting at 01/13 1400)  AMBULATORY STATUS COMMUNICATION OF NEEDS Skin   Extensive Assist Verbally Normal                       Personal Care Assistance Level of Assistance  Dressing, Feeding, Bathing Bathing Assistance: Maximum assistance Feeding assistance: Limited assistance Dressing Assistance: Maximum assistance Total Care Assistance: Maximum assistance   Functional Limitations Info  Speech     Speech Info: Impaired    SPECIAL CARE FACTORS FREQUENCY  PT (By licensed PT), OT (By licensed OT)     PT Frequency: 2x OT Frequency: 2x            Contractures Contractures Info: Not present    Additional Factors Info  Code Status, Allergies Code Status Info: FULL Allergies Info: NKA           Current Medications (05/23/2024):  This is the current hospital active medication list Current Facility-Administered Medications  Medication Dose Route Frequency Provider Last Rate Last Admin   acetaminophen  (TYLENOL ) tablet 650 mg  650 mg Oral Q6H PRN Fernand Prost, MD       Or   acetaminophen  (TYLENOL ) suppository 650 mg  650 mg Rectal Q6H PRN Fernand Prost, MD       arformoterol  (  BROVANA ) nebulizer solution 15 mcg  15 mcg Nebulization BID Wouk, Devaughn Sayres, MD       And   umeclidinium bromide  (INCRUSE ELLIPTA ) 62.5 MCG/ACT 1 puff  1 puff Inhalation Daily Wouk, Devaughn Sayres, MD       atorvastatin  (LIPITOR) tablet 20 mg  20 mg Oral QPM Fernand Prost, MD       bisoprolol  (ZEBETA ) tablet 2.5 mg  2.5 mg Oral QHS Khan, Ghalib, MD       dapagliflozin  propanediol (FARXIGA ) tablet 10 mg  10 mg Oral QAC  breakfast Fernand Prost, MD   10 mg at 05/23/24 9173   ferrous sulfate  tablet 325 mg  325 mg Oral Q breakfast Fernand Prost, MD   325 mg at 05/23/24 9173   folic acid  (FOLVITE ) tablet 1 mg  1 mg Oral Daily Khan, Ghalib, MD   1 mg at 05/23/24 1005   heparin  injection 5,000 Units  5,000 Units Subcutaneous Q8H Khan, Ghalib, MD   5,000 Units at 05/23/24 0826   ipratropium-albuterol  (DUONEB) 0.5-2.5 (3) MG/3ML nebulizer solution 3 mL  3 mL Nebulization Q6H PRN Fernand Prost, MD       lactated ringers  infusion   Intravenous Continuous Wouk, Devaughn Sayres, MD       levETIRAcetam  (KEPPRA ) tablet 750 mg  750 mg Oral BID Khan, Ghalib, MD   750 mg at 05/23/24 1005   ondansetron  (ZOFRAN ) tablet 4 mg  4 mg Oral Q6H PRN Fernand Prost, MD       Or   ondansetron  (ZOFRAN ) injection 4 mg  4 mg Intravenous Q6H PRN Fernand Prost, MD       pantoprazole  (PROTONIX ) EC tablet 40 mg  40 mg Oral Daily Khan, Ghalib, MD   40 mg at 05/23/24 1005   senna-docusate (Senokot-S) tablet 1 tablet  1 tablet Oral QHS PRN Fernand Prost, MD       sodium chloride  flush (NS) 0.9 % injection 3 mL  3 mL Intravenous Q12H Khan, Ghalib, MD   3 mL at 05/23/24 1007   traZODone  (DESYREL ) tablet 50 mg  50 mg Oral QHS Khan, Ghalib, MD   50 mg at 05/22/24 2115   Current Outpatient Medications  Medication Sig Dispense Refill   acetaminophen  (TYLENOL ) 325 MG tablet Take 2 tablets (650 mg total) by mouth every 6 (six) hours as needed for mild pain (pain score 1-3) or fever (or Fever >/= 101).     atorvastatin  (LIPITOR) 20 MG tablet Take 1 tablet (20 mg total) by mouth every evening. 30 tablet 0   bisoprolol  2.5 MG TABS Take 2.5 mg by mouth at bedtime. 30 tablet 0   cholecalciferol (VITAMIN D3) 25 MCG (1000 UNIT) tablet Take 1 tablet (1,000 Units total) by mouth daily. 30 tablet 0   FARXIGA  10 MG TABS tablet Take 1 tablet (10 mg total) by mouth daily before breakfast. 30 tablet 0   FEROSUL 325 (65 Fe) MG tablet Take 1 tablet (325 mg total) by mouth daily  with breakfast. 30 tablet 0   folic acid  (FOLVITE ) 1 MG tablet Take 1 tablet (1 mg total) by mouth daily. 30 tablet 0   levETIRAcetam  (KEPPRA ) 750 MG tablet Take 1 tablet (750 mg total) by mouth 2 (two) times daily. 60 tablet 0   Multiple Vitamin (MULTIVITAMIN ADULT) TABS Take 2 tablets by mouth daily. 60 tablet 0   omeprazole  (PRILOSEC) 20 MG capsule Take 1 capsule (20 mg total) by mouth daily before breakfast. 30 capsule 0   PROAIR   HFA 108 (90 Base) MCG/ACT inhaler Inhale 2 puffs into the lungs every 6 (six) hours as needed for shortness of breath or wheezing. 18 g 0   sodium zirconium cyclosilicate  (LOKELMA ) 10 g PACK packet Take 10 g by mouth daily. 30 packet 0   thiamine  (VITAMIN B1) 100 MG tablet Take 1 tablet (100 mg total) by mouth daily. 30 tablet 0   Tiotropium Bromide -Olodaterol (STIOLTO RESPIMAT ) 2.5-2.5 MCG/ACT AERS Inhale 2 puffs into the lungs daily. Pt needs to make an appointment to continue to receive refills please. 1 each 0   traZODone  (DESYREL ) 50 MG tablet Take 1 tablet (50 mg total) by mouth at bedtime. 30 tablet 0     Discharge Medications: Please see discharge summary for a list of discharge medications.  Relevant Imaging Results:  Relevant Lab Results:   Additional Information [id number]  Nicholle Falzon L Regana Kemple, LCSW     "

## 2024-05-23 NOTE — Progress Notes (Addendum)
 " Progress Note   Patient: Darin Hopkins FMW:[id number] DOB: March 27, 1961 DOA: 05/21/2024  DOS: the patient was seen and examined on 05/23/2024   Brief hospital course:  64 y.o. year old male with medical history of hypertension, hyperlipidemia, chronic respiratory failure with hypoxia and hypercapnia secondary to COPD, CHF presenting to the ED with generalized weakness.    Assessment and Plan:  Acute on chronic hypoxic hypercapnic respiratory failure - VBG on presentation 7.28/83/38.  Patient does not have CPAP at home or is not compliant reportedly.  Responded well to BiPAP although again noncompliant and refusing.  Will lower O2 sat goal to 88-92. Persistent hypercarbia, will plan on nightly bipap  COPD - Does not appear to be in acute exacerbation.  No active wheezing appreciated.  Patient states he is not short of breath.  Continue nebulizers.  Acute kidney injury on CKD 3B - Last known creatinine baseline around 1.7.  Creatinine 2.14 on presentation.  Likely volume depletion.  Will give an additional liter  Mild hyponatremia - Likely secondary to volume depletion.  Resolved after IV fluid hydration.  Chronic HFrEF - Hypovolemic on presentation.  Hypertension - Hydralazine  as needed.  No antihypertensives on board.  Pancreatic mass -Patient was to have outpatient evaluation for this. He had an appointment with Dr. Wilhelmenia that appears to have been canceled. CA 19 9 was measured last admission and was slightly above upper limit of normal.  Gi says no ability to do eus today, will need to f/u outpt for that  Physical debilitation and muscle weakness/severe protein calorie malnutrition - Patient very cachectic, low p.o. intake, possible failure to thrive.  PT advising snf and patient agreeable, will consult toc. LFTs wnl, electrolytes wnl, ua nothing significantly abnormal. Hiv neg. Pancreatic mass as above. Denies alcohol or other drugs - f/u hcv  Seizure disorder - home  keppra   Subjective: no pain, no dyspnea, reports generalized weakness, otherwise feeling fine  Physical Exam:  Vitals:   05/22/24 2104 05/23/24 0149 05/23/24 0634 05/23/24 0828  BP:  (!) 149/77  (!) 158/78  Pulse:  (!) 55  (!) 52  Resp:  16  15  Temp: 98.2 F (36.8 C) 97.9 F (36.6 C) 98.1 F (36.7 C) (!) 97.5 F (36.4 C)  TempSrc: Oral  Oral Oral  SpO2:  100%  100%  Weight:      Height:        GENERAL:  Alert, pleasant, no acute distress, cachectic HEENT:  EOMI, nasal cannula CARDIOVASCULAR:  RRR, no murmurs appreciated RESPIRATORY: Poor air movement bilaterally, normal wob no wheeze GASTROINTESTINAL:  Soft, nontender, nondistended EXTREMITIES: Thin, no LE edema bilaterally NEURO:  No new focal deficits appreciated SKIN:  No rashes noted PSYCH:  Appropriate mood and affect     Data Reviewed:  Imaging Studies: DG Chest Portable 1 View Result Date: 05/21/2024 CLINICAL DATA:  Weakness EXAM: PORTABLE CHEST 1 VIEW COMPARISON:  02/01/2024 FINDINGS: Two frontal views of the chest demonstrates stable enlargement of the cardiac silhouette. There are bibasilar ground-glass opacities, right greater than left, which could reflect infection or aspiration. Trace left pleural effusion. No pneumothorax. No acute bony abnormalities. IMPRESSION: 1. Bibasilar ground-glass airspace disease, which may reflect bibasilar pneumonia or aspiration. 2. Trace left pleural effusion. Electronically Signed   By: Ozell Daring M.D.   On: 05/21/2024 16:43    There are no new results to review at this time.  Previous records (including but not limited to H&P, progress notes, nursing notes, TOC management)  were reviewed in assessment of this patient.  Labs: CBC: Recent Labs  Lab 05/21/24 1259 05/22/24 0521 05/23/24 0620  WBC 8.3 6.8 7.9  NEUTROABS 5.2  --   --   HGB 9.7* 8.1* 10.0*  HCT 32.8* 27.0* 33.0*  MCV 96.8 96.1 95.4  PLT 115* 115* 134*   Basic Metabolic Panel: Recent Labs  Lab  05/21/24 1259 05/21/24 1607 05/22/24 0521 05/23/24 0620  NA 148*  --  144 143  K 3.9  --  3.5 3.5  CL 104  --  105 102  CO2 32  --  33* 32  GLUCOSE 86  --  95 87  BUN 29*  --  32* 28*  CREATININE 2.14*  --  1.99* 2.06*  CALCIUM  9.5  --  8.3* 8.8*  MG  --  1.8 1.6*  --    Liver Function Tests: Recent Labs  Lab 05/21/24 1259  AST 28  ALT 15  ALKPHOS 55  BILITOT 0.5  PROT 7.7  ALBUMIN 4.1   CBG: Recent Labs  Lab 05/22/24 0808 05/23/24 0735  GLUCAP 70 71    Scheduled Meds:  atorvastatin   20 mg Oral QPM   bisoprolol   2.5 mg Oral QHS   dapagliflozin  propanediol  10 mg Oral QAC breakfast   ferrous sulfate   325 mg Oral Q breakfast   folic acid   1 mg Oral Daily   heparin   5,000 Units Subcutaneous Q8H   levETIRAcetam   750 mg Oral BID   pantoprazole   40 mg Oral Daily   sodium chloride  flush  3 mL Intravenous Q12H   traZODone   50 mg Oral QHS   Continuous Infusions:   PRN Meds:.acetaminophen  **OR** acetaminophen , ipratropium-albuterol , ondansetron  **OR** ondansetron  (ZOFRAN ) IV, senna-docusate  Family Communication: None at bedside  Disposition: Status is: inpt      Length of inpatient stay: 1 days  Author: Devaughn KATHEE Ban, MD 05/23/2024 1:23 PM  For on call review [email or web address]   "

## 2024-05-23 NOTE — ED Notes (Signed)
 This tech assisted pt up to toilet in room. Pt able to ambulate with a slow steady gait with the use of a walker. Pt placed in a new brief and is in bed dry with no further needs.

## 2024-05-24 ENCOUNTER — Telehealth (HOSPITAL_COMMUNITY): Payer: Self-pay

## 2024-05-24 ENCOUNTER — Other Ambulatory Visit: Payer: Self-pay | Admitting: Family

## 2024-05-24 ENCOUNTER — Other Ambulatory Visit (HOSPITAL_COMMUNITY): Payer: Self-pay

## 2024-05-24 ENCOUNTER — Other Ambulatory Visit: Payer: Self-pay | Admitting: Internal Medicine

## 2024-05-24 DIAGNOSIS — R627 Adult failure to thrive: Secondary | ICD-10-CM | POA: Diagnosis not present

## 2024-05-24 LAB — BASIC METABOLIC PANEL WITH GFR
Anion gap: 6 (ref 5–15)
BUN: 27 mg/dL — ABNORMAL HIGH (ref 8–23)
CO2: 34 mmol/L — ABNORMAL HIGH (ref 22–32)
Calcium: 8.5 mg/dL — ABNORMAL LOW (ref 8.9–10.3)
Chloride: 103 mmol/L (ref 98–111)
Creatinine, Ser: 2.01 mg/dL — ABNORMAL HIGH (ref 0.61–1.24)
GFR, Estimated: 37 mL/min — ABNORMAL LOW
Glucose, Bld: 85 mg/dL (ref 70–99)
Potassium: 3.5 mmol/L (ref 3.5–5.1)
Sodium: 143 mmol/L (ref 135–145)

## 2024-05-24 LAB — CBG MONITORING, ED: Glucose-Capillary: 83 mg/dL (ref 70–99)

## 2024-05-24 NOTE — Progress Notes (Signed)
 " Progress Note   Patient: Darin Hopkins FMW:[id number] DOB: 1961/03/11 DOA: 05/21/2024  DOS: the patient was seen and examined on 05/24/2024   Brief hospital course:  64 y.o. year old male with medical history of hypertension, hyperlipidemia, chronic respiratory failure with hypoxia and hypercapnia secondary to COPD, CHF presenting to the ED with generalized weakness.    Assessment and Plan:  Acute on chronic hypoxic hypercapnic respiratory failure - VBG on presentation 7.28/83/38.  Patient does not have CPAP at home or is not compliant reportedly.  Responded well to BiPAP although again noncompliant and refusing.  Will lower O2 sat goal to 88-92. Persistent hypercarbia, will plan on nightly bipap   COPD - Does not appear to be in acute exacerbation.  No active wheezing appreciated.  Patient states he is not short of breath.  Continue nebulizers.  Acute kidney injury on CKD 3B - Last known creatinine baseline around 1.7.  Creatinine 2.14 on presentation.  Now stable around 2. Tolerating po  Mild hypernatremia - Likely secondary to volume depletion.  Resolved after IV fluid hydration.  Chronic HFrEF - Hypovolemic on presentation.  Hypertension - Hydralazine  as needed.  No antihypertensives on board.  Pancreatic mass -Patient was to have outpatient evaluation for this. He had an appointment with Dr. Wilhelmenia that appears to have been canceled. CA 19 9 was measured last admission and was slightly above upper limit of normal.  Gi says no ability to do eus today, will need to f/u outpt for that  Physical debilitation and muscle weakness/severe protein calorie malnutrition - Patient very cachectic, low p.o. intake, possible failure to thrive.  PT advising snf and patient agreeable, will consult toc. LFTs wnl, electrolytes wnl, ua nothing significantly abnormal. Hiv neg. Pancreatic mass as above. Denies alcohol or other drugs - f/u hcv  Seizure disorder - home keppra   Debility -  SNF bed search underway  Subjective: no pain, no dyspnea, reports generalized weakness, had bm today, feeling fine  Physical Exam:  Vitals:   05/24/24 0745 05/24/24 1000 05/24/24 1020 05/24/24 1141  BP: (!) 154/80 (!) 155/85  (!) 140/88  Pulse: (!) 53 66 (!) 58 (!) 55  Resp: 18   16  Temp: 97.8 F (36.6 C)   97.9 F (36.6 C)  TempSrc: Oral   Oral  SpO2: 100% (S) (!) 81% 100% 97%  Weight:      Height:        GENERAL:  Alert, pleasant, no acute distress, cachectic HEENT:  EOMI, nasal cannula CARDIOVASCULAR:  RRR, no murmurs appreciated RESPIRATORY: Poor air movement bilaterally, normal wob no wheeze GASTROINTESTINAL:  Soft, nontender, nondistended EXTREMITIES: Thin, no LE edema bilaterally NEURO:  No new focal deficits appreciated SKIN:  No rashes noted PSYCH:  Appropriate mood and affect     Data Reviewed:  Imaging Studies: DG Chest Portable 1 View Result Date: 05/21/2024 CLINICAL DATA:  Weakness EXAM: PORTABLE CHEST 1 VIEW COMPARISON:  02/01/2024 FINDINGS: Two frontal views of the chest demonstrates stable enlargement of the cardiac silhouette. There are bibasilar ground-glass opacities, right greater than left, which could reflect infection or aspiration. Trace left pleural effusion. No pneumothorax. No acute bony abnormalities. IMPRESSION: 1. Bibasilar ground-glass airspace disease, which may reflect bibasilar pneumonia or aspiration. 2. Trace left pleural effusion. Electronically Signed   By: Ozell Daring M.D.   On: 05/21/2024 16:43    There are no new results to review at this time.  Previous records (including but not limited to H&P, progress notes,  nursing notes, TOC management) were reviewed in assessment of this patient.  Labs: CBC: Recent Labs  Lab 05/21/24 1259 05/22/24 0521 05/23/24 0620  WBC 8.3 6.8 7.9  NEUTROABS 5.2  --   --   HGB 9.7* 8.1* 10.0*  HCT 32.8* 27.0* 33.0*  MCV 96.8 96.1 95.4  PLT 115* 115* 134*   Basic Metabolic Panel: Recent  Labs  Lab 05/21/24 1259 05/21/24 1607 05/22/24 0521 05/23/24 0620 05/24/24 0344  NA 148*  --  144 143 143  K 3.9  --  3.5 3.5 3.5  CL 104  --  105 102 103  CO2 32  --  33* 32 34*  GLUCOSE 86  --  95 87 85  BUN 29*  --  32* 28* 27*  CREATININE 2.14*  --  1.99* 2.06* 2.01*  CALCIUM  9.5  --  8.3* 8.8* 8.5*  MG  --  1.8 1.6*  --   --    Liver Function Tests: Recent Labs  Lab 05/21/24 1259  AST 28  ALT 15  ALKPHOS 55  BILITOT 0.5  PROT 7.7  ALBUMIN 4.1   CBG: Recent Labs  Lab 05/22/24 0808 05/23/24 0735 05/24/24 0728  GLUCAP 70 71 83    Scheduled Meds:  arformoterol   15 mcg Nebulization BID   And   umeclidinium bromide   1 puff Inhalation Daily   atorvastatin   20 mg Oral QPM   bisoprolol   2.5 mg Oral QHS   dapagliflozin  propanediol  10 mg Oral QAC breakfast   ferrous sulfate   325 mg Oral Q breakfast   folic acid   1 mg Oral Daily   heparin   5,000 Units Subcutaneous Q8H   levETIRAcetam   750 mg Oral BID   pantoprazole   40 mg Oral Daily   sodium chloride  flush  3 mL Intravenous Q12H   traZODone   50 mg Oral QHS   Continuous Infusions:   PRN Meds:.acetaminophen  **OR** acetaminophen , ipratropium-albuterol , ondansetron  **OR** ondansetron  (ZOFRAN ) IV, senna-docusate  Family Communication: None at bedside  Disposition: Status is: inpt       Length of inpatient stay: 2 days  Author: Devaughn KATHEE Ban, MD 05/24/2024 2:02 PM  For on call review [email or web address]   "

## 2024-05-24 NOTE — TOC Progression Note (Signed)
 Transition of Care [facility]) - Progression Note    Patient Details  Name: Darin Hopkins MRN: [id number] Date of Birth: 20-Jan-1961  [facility] CM/SW Contact  Caetano Oberhaus L Sedric Guia, [place] Phone Number: 05/24/2024, 8:25 AM  Clinical Narrative:     CSW spoke with patients sister, Ronal. Ronal was provided bed offers. [name] consulted with her daughter, Florencia. Family accepts bed offer at [facility]. Facility notified and shara will be started by the facility.                     Expected Discharge Plan and Services                                               Social Drivers of Health (SDOH) Interventions SDOH Screenings   Food Insecurity: No Food Insecurity (04/03/2024)  Housing: Low Risk (04/03/2024)  Transportation Needs: No Transportation Needs (04/03/2024)  Utilities: Not At Risk (04/03/2024)  Depression (PHQ2-9): Low Risk (02/11/2022)  Tobacco Use: Medium Risk (05/21/2024)    Readmission Risk Interventions     No data to display

## 2024-05-24 NOTE — Telephone Encounter (Signed)
 Pharmacy Patient Advocate Encounter  Insurance verification completed.    The patient is insured through [facility].     Ran test claim for Farxiga  10mg  tablet and the current 30 day co-pay is $4.   This test claim was processed through [employer]- copay amounts may vary at other pharmacies due to [employer], or as the patient moves through the different stages of their insurance plan.

## 2024-05-24 NOTE — ED Notes (Signed)
 Pt cleaned of urinary incontinence. Brief changed. Repositioned in bed. Tolerated well.

## 2024-05-25 DIAGNOSIS — R627 Adult failure to thrive: Secondary | ICD-10-CM | POA: Diagnosis not present

## 2024-05-25 LAB — HCV RT-PCR, QUANT (NON-GRAPH): Hepatitis C Quantitation: NOT DETECTED [IU]/mL

## 2024-05-25 LAB — CBG MONITORING, ED: Glucose-Capillary: 109 mg/dL — ABNORMAL HIGH (ref 70–99)

## 2024-05-25 LAB — HCV AB W REFLEX TO QUANT PCR: HCV Ab: REACTIVE — AB

## 2024-05-25 NOTE — Progress Notes (Signed)
 " Progress Note   Patient: Darin Hopkins FMW:[id number] DOB: 1960-09-15 DOA: 05/21/2024  DOS: the patient was seen and examined on 05/25/2024   Brief hospital course:  64 y.o. year old male with medical history of hypertension, hyperlipidemia, chronic respiratory failure with hypoxia and hypercapnia secondary to COPD, CHF presenting to the ED with generalized weakness.    Assessment and Plan:  Acute on chronic hypoxic hypercapnic respiratory failure - VBG on presentation 7.28/83/38.  Patient does not have CPAP at home or is not compliant reportedly.  Responded well to BiPAP although again noncompliant and refusing.  Will lower O2 sat goal to 88-92. Persistent hypercarbia, will plan on nightly bipap   COPD - Does not appear to be in acute exacerbation.  No active wheezing appreciated.  Patient states he is not short of breath.  Continue nebulizers. Stable.  Acute kidney injury on CKD 3B - Last known creatinine baseline around 1.7.  Creatinine 2.14 on presentation.  Now stable around 2. Tolerating po  Mild hypernatremia - Likely secondary to volume depletion.  Resolved after IV fluid hydration which has now been discontinued  Chronic HFrEF - Hypovolemic on presentation.  Hypertension - Hydralazine  as needed.  No antihypertensives on board.  Pancreatic mass -Patient was to have outpatient evaluation for this. He had an appointment with Dr. Wilhelmenia that appears to have been canceled. CA 19 9 was measured last admission and was slightly above upper limit of normal.  Gi says no ability to do eus today, will need to f/u outpt for that  Physical debilitation and muscle weakness/severe protein calorie malnutrition - Patient very cachectic, low p.o. intake, possible failure to thrive.  PT advising snf and patient agreeable, will consult toc. LFTs wnl, electrolytes wnl, ua nothing significantly abnormal. Hiv neg. Pancreatic mass as above. Denies alcohol or other drugs - f/u hcv  Seizure  disorder - home keppra   Debility - SNF bed search underway, has bed, auth pending  Malnutrition Severe - rd consult  Subjective: feeling fine, tolerating diet, no dyspnea, bm this morning  Physical Exam:  Vitals:   05/25/24 0400 05/25/24 0639 05/25/24 1036 05/25/24 1438  BP: 132/80 121/72 130/78 (!) 154/75  Pulse:  (!) 52 (!) 58 64  Resp:  18 16 17   Temp:  98 F (36.7 C) 98.3 F (36.8 C)   TempSrc:  Axillary Oral   SpO2:  95% 95% 95%  Weight:      Height:        GENERAL:  Alert, pleasant, no acute distress, cachectic HEENT:  EOMI, nasal cannula CARDIOVASCULAR:  RRR, no murmurs appreciated RESPIRATORY: Poor air movement bilaterally, normal wob no wheeze GASTROINTESTINAL:  Soft, nontender, nondistended EXTREMITIES: Thin, no LE edema bilaterally NEURO:  No new focal deficits appreciated SKIN:  No rashes noted PSYCH:  Appropriate mood and affect     Data Reviewed:  Imaging Studies: DG Chest Portable 1 View Result Date: 05/21/2024 CLINICAL DATA:  Weakness EXAM: PORTABLE CHEST 1 VIEW COMPARISON:  02/01/2024 FINDINGS: Two frontal views of the chest demonstrates stable enlargement of the cardiac silhouette. There are bibasilar ground-glass opacities, right greater than left, which could reflect infection or aspiration. Trace left pleural effusion. No pneumothorax. No acute bony abnormalities. IMPRESSION: 1. Bibasilar ground-glass airspace disease, which may reflect bibasilar pneumonia or aspiration. 2. Trace left pleural effusion. Electronically Signed   By: Ozell Daring M.D.   On: 05/21/2024 16:43    There are no new results to review at this time.  Previous records (  including but not limited to H&P, progress notes, nursing notes, TOC management) were reviewed in assessment of this patient.  Labs: CBC: Recent Labs  Lab 05/21/24 1259 05/22/24 0521 05/23/24 0620  WBC 8.3 6.8 7.9  NEUTROABS 5.2  --   --   HGB 9.7* 8.1* 10.0*  HCT 32.8* 27.0* 33.0*  MCV 96.8 96.1  95.4  PLT 115* 115* 134*   Basic Metabolic Panel: Recent Labs  Lab 05/21/24 1259 05/21/24 1607 05/22/24 0521 05/23/24 0620 05/24/24 0344  NA 148*  --  144 143 143  K 3.9  --  3.5 3.5 3.5  CL 104  --  105 102 103  CO2 32  --  33* 32 34*  GLUCOSE 86  --  95 87 85  BUN 29*  --  32* 28* 27*  CREATININE 2.14*  --  1.99* 2.06* 2.01*  CALCIUM  9.5  --  8.3* 8.8* 8.5*  MG  --  1.8 1.6*  --   --    Liver Function Tests: Recent Labs  Lab 05/21/24 1259  AST 28  ALT 15  ALKPHOS 55  BILITOT 0.5  PROT 7.7  ALBUMIN 4.1   CBG: Recent Labs  Lab 05/22/24 0808 05/23/24 0735 05/24/24 0728  GLUCAP 70 71 83    Scheduled Meds:  arformoterol   15 mcg Nebulization BID   And   umeclidinium bromide   1 puff Inhalation Daily   atorvastatin   20 mg Oral QPM   bisoprolol   2.5 mg Oral QHS   dapagliflozin  propanediol  10 mg Oral QAC breakfast   ferrous sulfate   325 mg Oral Q breakfast   folic acid   1 mg Oral Daily   heparin   5,000 Units Subcutaneous Q8H   levETIRAcetam   750 mg Oral BID   pantoprazole   40 mg Oral Daily   sodium chloride  flush  3 mL Intravenous Q12H   traZODone   50 mg Oral QHS   Continuous Infusions:   PRN Meds:.acetaminophen  **OR** acetaminophen , ipratropium-albuterol , ondansetron  **OR** ondansetron  (ZOFRAN ) IV, senna-docusate  Family Communication: sister Ronal updated telephonically 1/16  Disposition: Status is: inpt       Length of inpatient stay: 3 days  Author: Devaughn KATHEE Ban, MD 05/25/2024 3:09 PM  For on call review [email or web address].   "

## 2024-05-25 NOTE — Progress Notes (Signed)
" ° °  Brief Progress Note   _____________________________________________________________________________________________________________  Patient Name: Darin Hopkins Patient DOB: 1960/05/19 Date: 05/25/24      Data: Patient reviewed. They have been Progressive LOC in the ED since 1/12. V/S 121/72, 52 and 95% on 3L. Appears to be waiting for SNF bed at [facility]. Patient on nightly BiPap.    Action: Reached out to provider to see about downgrade.    Response:  Due to nightly BiPap, patient will still would need Progressive floor.   _____________________________________________________________________________________________________________  [facility] RN Expeditor Christepher Melchior Please contact us  directly via secure chat (search for [facility]) or by calling us  at [phone] [facility]).  "

## 2024-05-25 NOTE — Progress Notes (Signed)
 Occupational Therapy Treatment Patient Details Name: Darin Hopkins MRN: [id number] DOB: [birth year] Today's Date: 05/25/2024   History of present illness Pt is a 64 y/o M admitted on 05/21/24 after presenting with c/o generalized weakness. Pt is being treated for failure to thrive. PMH: HTN, HLD, chronic respiratory failure with hypoxia & hypercapnia 2/2 COPD, CHF, seizures   OT comments  Pt making gradual progress towards goals, limited by global deconditioning and decreased cardiopulmonary status. Pt recieved on 4L via [place] (donned partially), SpO2 95% at rest. Pt desaturates to 82% sitting at EOB for ~1 min (no signs of dyspnea or shortness of breath). Increased up to 6L via [place] with slow improvements up to 90%>. With cues for PLB, pt able to be weaned back down to 4L. Pt additionally reporting dizziness at EOB and intermittent double vision that resolves. RN notified and alerting MD. Pt requires MIN A for bed mobility, unable to progress gait due to decreased tolerance. OT will follow acutely.       If plan is discharge home, recommend the following:  A lot of help with bathing/dressing/bathroom;A little help with walking and/or transfers;A lot of help with walking and/or transfers;Help with stairs or ramp for entrance   Equipment Recommendations  Other (comment)       Precautions / Restrictions Precautions Precautions: Fall Recall of Precautions/Restrictions: Intact Restrictions Weight Bearing Restrictions Per Provider Order: No       Mobility Bed Mobility Overal bed mobility: Needs Assistance Bed Mobility: Supine to Sit, Sit to Supine     Supine to sit: Min assist Sit to supine: Min assist        Transfers Overall transfer level: Needs assistance                       Balance                                           ADL either performed or assessed with clinical judgement   ADL Overall ADL's : Needs assistance/impaired                                              Communication Communication Communication: Impaired Factors Affecting Communication: Hearing impaired   Cognition Arousal: Alert Behavior During Therapy: WFL for tasks assessed/performed                                 Following commands: Intact        Cueing   Cueing Techniques: Verbal cues        General Comments Pt recieved on 4L via [place] (donned partially), SpO2 95% at rest. Pt desaturates to 82% sitting at EOB for ~1 min. Increased up to 6L via [place] with slow improvements up to 90%>. With cues for PLB, pt able to be weaned back down to 4L. Pt additionally reporting dizziness at EOB and intermittent double vision that resolves. RN notified.    Pertinent Vitals/ Pain       Pain Assessment Pain Assessment: No/denies pain   Frequency  Min 2X/week        Progress Toward Goals  OT Goals(current goals can now be found in the care  plan section)  Progress towards OT goals: Progressing toward goals  Acute Rehab OT Goals OT Goal Formulation: With patient Time For Goal Achievement: 06/05/24 Potential to Achieve Goals: Good ADL Goals Pt Will Perform Grooming: with set-up;standing Pt Will Perform Lower Body Dressing: with min assist;sitting/lateral leans;sit to/from stand Pt Will Transfer to Toilet: with supervision;with contact guard assist;ambulating  Plan         AM-PAC OT 6 Clicks Daily Activity     Outcome Measure   Help from another person eating meals?: A Little Help from another person taking care of personal grooming?: A Little Help from another person toileting, which includes using toliet, bedpan, or urinal?: A Lot Help from another person bathing (including washing, rinsing, drying)?: A Lot Help from another person to put on and taking off regular upper body clothing?: A Little Help from another person to put on and taking off regular lower body clothing?: A Lot 6 Click Score: 15    End of Session  Equipment Utilized During Treatment: Oxygen  OT Visit Diagnosis: Other abnormalities of gait and mobility (R26.89);Muscle weakness (generalized) (M62.81);Unsteadiness on feet (R26.81)   Activity Tolerance Other (comment) (limited by decreased cardiopulmonary status and global deconditioning)   Patient Left in bed;with call bell/phone within reach   Nurse Communication Mobility status        Time: 8542-8478 OT Time Calculation (min): 24 min  Charges: OT General Charges $OT Visit: 1 Visit OT Treatments $Self Care/Home Management : 23-37 mins Jyoti Harju L. Anjalina Bergevin, OTR/L  05/25/24, 4:08 PM

## 2024-05-25 NOTE — ED Notes (Signed)
 Pt was working with physical therapy. They informed this nurse that pt de sated while sitting in bed and moving around to 82%. Pt raised up to 95% while put on 6L [place] and resting in bed. MD Wouk notified.

## 2024-05-25 NOTE — Plan of Care (Signed)
  Problem: Education: Goal: Knowledge of General Education information will improve Description: Including pain rating scale, medication(s)/side effects and non-pharmacologic comfort measures Outcome: Progressing   Problem: Clinical Measurements: Goal: Will remain free from infection Outcome: Progressing   Problem: Nutrition: Goal: Adequate nutrition will be maintained Outcome: Progressing   

## 2024-05-26 DIAGNOSIS — R627 Adult failure to thrive: Secondary | ICD-10-CM | POA: Diagnosis not present

## 2024-05-26 LAB — BASIC METABOLIC PANEL WITH GFR
Anion gap: 4 — ABNORMAL LOW (ref 5–15)
BUN: 28 mg/dL — ABNORMAL HIGH (ref 8–23)
CO2: 33 mmol/L — ABNORMAL HIGH (ref 22–32)
Calcium: 9 mg/dL (ref 8.9–10.3)
Chloride: 104 mmol/L (ref 98–111)
Creatinine, Ser: 2.18 mg/dL — ABNORMAL HIGH (ref 0.61–1.24)
GFR, Estimated: 33 mL/min — ABNORMAL LOW
Glucose, Bld: 80 mg/dL (ref 70–99)
Potassium: 4.2 mmol/L (ref 3.5–5.1)
Sodium: 142 mmol/L (ref 135–145)

## 2024-05-26 LAB — GLUCOSE, CAPILLARY
Glucose-Capillary: 74 mg/dL (ref 70–99)
Glucose-Capillary: 181 mg/dL — ABNORMAL HIGH (ref 70–99)

## 2024-05-26 NOTE — TOC Transition Note (Signed)
 Transition of Care [facility]) - Discharge Note   Patient Details  Name: Darin Hopkins MRN: [id number] Date of Birth: 1960-12-01  Transition of Care [facility]) CM/SW Contact:  Victory Jackquline RAMAN, RN Phone Number: 05/26/2024, 12:10 PM   Clinical Narrative:    Patient discharging to [facility] RM# [id number]. Report to be called to [phone]. Transportation set up with [facility], spoke to [employer] and there is 1 person ahead of him. MD and bedside nurse made aware. RNCM called and informed the patient's sister Ronal @ [phone] of patient discharging to [facility]. No further concerns. RNCM signing off.   Final next level of care: Skilled Nursing Facility Barriers to Discharge: Barriers Resolved   Patient Goals and CMS Choice            Discharge Placement                Patient to be transferred to facility by: [facility] Name of family member notified: [name] Patient and family notified of of transfer: 05/26/24  Discharge Plan and Services Additional resources added to the After Visit Summary for                                       Social Drivers of Health (SDOH) Interventions SDOH Screenings   Food Insecurity: No Food Insecurity (05/25/2024)  Housing: Low Risk (05/25/2024)  Transportation Needs: No Transportation Needs (05/25/2024)  Utilities: Not At Risk (05/25/2024)  Depression (PHQ2-9): Low Risk (02/11/2022)  Tobacco Use: Medium Risk (05/21/2024)     Readmission Risk Interventions     No data to display

## 2024-05-26 NOTE — Discharge Summary (Addendum)
 Darin Hopkins FMW:[id number] DOB: May 09, 1961 DOA: 05/21/2024  PCP: [name], NP  Admit date: 05/21/2024 Discharge date: 05/26/2024  Time spent: 35 minutes  Recommendations for Outpatient Follow-up:  Consider outpatient bipap if patient changes mind GI f/u (Dr. Wilhelmenia in [place], or referral to [facility] or [facility]) Pcp f/u     Discharge Diagnoses:  Principal Problem:   Failure to thrive in adult Active Problems:   COPD (chronic obstructive pulmonary disease) (HCC)   Pancreatic mass   Essential hypertension   Hyperlipidemia   GERD (gastroesophageal reflux disease)   Chronic respiratory failure with hypoxia and hypercapnia (HCC)   Chronic diastolic CHF (congestive heart failure) (HCC)   Discharge Condition: stable  Diet recommendation: regular  Filed Weights   05/21/24 1258 05/26/24 0500  Weight: 54 kg 56.3 kg    History of present illness:  From admission h and p Darin Hopkins is a 64 y.o. year old male with medical history of hypertension, hyperlipidemia, chronic respiratory failure with hypoxia and hypercapnia secondary to COPD, CHF presenting to the ED with generalized weakness.  Patient reports weakness has been present over the last week but it has acutely worsened today.  He was able to ambulate but has not been able to ambulate today.  He reports when he stands up his legs give out.  He denies any URI symptoms, coughing, fevers or chills.  On arrival to the ED patient was noted to be HDS stable.  Lab work and imaging obtained.  CBC without leukocytosis, mild anemia improved from baseline, thrombocytopenia that is mild at 117K.  BMP with mild hypernatremia at 148, slight creatinine elevation with creatinine at 2.14 consistent with CKD 3B.  Hepatic function checked and normal.  Troponin checked and elevated but downtrending.  Ammonia checked and normal.  VBG showed 7.2 8/83/38.  UA without signs of infection.  Chest x-ray showing bibasilar opacities concerning for  atelectasis versus pneumonia.  Patient's family at bedside stating it is difficult to care for the patient and he is having difficulty with ambulation, given need for continued care, TRH contacted for admission.   Hospital Course:   Acute on chronic hypoxic hypercapnic respiratory failure - VBG on presentation 7.28/83/38.    Responded well to BiPAP. Ordered nightly but respiratory repeatedly failed to place it, and patient stable off it, now refusing, so will hold on ordering at discharge, though do think would benefit from it if changes his mind  COPD - Does not appear to be in acute exacerbation.  No active wheezing appreciated.  Patient states he is not short of breath.  Continue home meds   Acute kidney injury on CKD 3B - Last known creatinine baseline around 1.7.  Creatinine here stable around 2, will need outpt f/u   Mild hypernatremia - Likely secondary to volume depletion.  Resolved after IV fluid hydration which has now been discontinued   Chronic HFrEF - Hypovolemic on presentation.   Hypertension - Hydralazine  as needed.  No antihypertensives on board.   Pancreatic mass Weight loss -Patient was to have outpatient evaluation for this. He had an appointment with Dr. Wilhelmenia that appears to have been canceled. CA 19 9 was measured last admission and was slightly above upper limit of normal.  Gi says no ability to do eus today, will need to f/u outpt for that. Strongly advised family to contact dr. Wilhelmenia to schedule f/u, if unable to be seen by him then will need [facility] to [facility]. Needs endoscopic evaluation  Physical debilitation and muscle weakness/severe protein calorie malnutrition - Patient very cachectic, low p.o. intake, possible failure to thrive.  PT advising snf and patient agreeable, will consult toc. LFTs wnl, electrolytes wnl, ua nothing significantly abnormal. Hiv neg. Pancreatic mass as above. Denies alcohol or other drugs. Hcv neg - gi eval as above    Seizure disorder - home keppra    Debility - d/c to snf    Procedures: none   Consultations: GI  Discharge Exam: Vitals:   05/26/24 0418 05/26/24 0851  BP: 130/74 (!) 149/80  Pulse: (!) 54 (!) 55  Resp: 20 16  Temp: 97.8 F (36.6 C) 97.8 F (36.6 C)  SpO2: 100% 99%    General: NAD, cachectic Cardiovascular: rrr Respiratory: ctab  Discharge Instructions   Discharge Instructions     Diet general   Complete by: As directed    Increase activity slowly   Complete by: As directed       Allergies as of 05/26/2024   No Known Allergies      Medication List     STOP taking these medications    Lokelma  10 g Pack packet Generic drug: sodium zirconium cyclosilicate        TAKE these medications    acetaminophen  325 MG tablet Commonly known as: TYLENOL  Take 2 tablets (650 mg total) by mouth every 6 (six) hours as needed for mild pain (pain score 1-3) or fever (or Fever >/= 101).   atorvastatin  20 MG tablet Commonly known as: LIPITOR Take 1 tablet (20 mg total) by mouth every evening.   Bisoprolol  Fumarate 2.5 MG Tabs Take 2.5 mg by mouth at bedtime.   cholecalciferol 25 MCG (1000 UNIT) tablet Commonly known as: VITAMIN D3 Take 1 tablet (1,000 Units total) by mouth daily.   Farxiga  10 MG Tabs tablet Generic drug: dapagliflozin  propanediol Take 1 tablet (10 mg total) by mouth daily before breakfast.   FeroSul 325 (65 Fe) MG tablet Generic drug: ferrous sulfate  Take 1 tablet (325 mg total) by mouth daily with breakfast.   folic acid  1 MG tablet Commonly known as: FOLVITE  Take 1 tablet (1 mg total) by mouth daily.   levETIRAcetam  750 MG tablet Commonly known as: KEPPRA  Take 1 tablet (750 mg total) by mouth 2 (two) times daily.   Multivitamin Adult Tabs Take 2 tablets by mouth daily.   omeprazole  20 MG capsule Commonly known as: PRILOSEC Take 1 capsule (20 mg total) by mouth daily before breakfast.   ProAir  HFA 108 (90 Base) MCG/ACT  inhaler Generic drug: albuterol  Inhale 2 puffs into the lungs every 6 (six) hours as needed for shortness of breath or wheezing.   Stiolto Respimat  2.5-2.5 MCG/ACT Aers Generic drug: Tiotropium Bromide -Olodaterol Inhale 2 puffs into the lungs daily. Pt needs to make an appointment to continue to receive refills please.   thiamine  100 MG tablet Commonly known as: VITAMIN B1 Take 1 tablet (100 mg total) by mouth daily.   traZODone  50 MG tablet Commonly known as: DESYREL  Take 1 tablet (50 mg total) by mouth at bedtime.       Allergies[1]  Contact information for follow-up providers     [name], NP Follow up.   Specialty: Nurse Practitioner Contact information: [address] [place] [address] [phone]         Mansouraty, Aloha Raddle., MD Follow up.   Specialties: Gastroenterology, Internal Medicine Why: for endoscopic ultrasound Contact information: [address] [place] [address] [phone]  Contact information for after-discharge care     Destination     [facility] .   Service: Skilled Nursing Contact information: [address] [place]  [address] [phone]                      The results of significant diagnostics from this hospitalization (including imaging, microbiology, ancillary and laboratory) are listed below for reference.    Significant Diagnostic Studies: DG Chest Portable 1 View Result Date: 05/21/2024 CLINICAL DATA:  Weakness EXAM: PORTABLE CHEST 1 VIEW COMPARISON:  02/01/2024 FINDINGS: Two frontal views of the chest demonstrates stable enlargement of the cardiac silhouette. There are bibasilar ground-glass opacities, right greater than left, which could reflect infection or aspiration. Trace left pleural effusion. No pneumothorax. No acute bony abnormalities. IMPRESSION: 1. Bibasilar ground-glass airspace disease, which may reflect bibasilar pneumonia or  aspiration. 2. Trace left pleural effusion. Electronically Signed   By: Ozell Daring M.D.   On: 05/21/2024 16:43    Microbiology: No results found for this or any previous visit (from the past 240 hours).   Labs: Basic Metabolic Panel: Recent Labs  Lab 05/21/24 1259 05/21/24 1607 05/22/24 0521 05/23/24 0620 05/24/24 0344 05/26/24 0419  NA 148*  --  144 143 143 142  K 3.9  --  3.5 3.5 3.5 4.2  CL 104  --  105 102 103 104  CO2 32  --  33* 32 34* 33*  GLUCOSE 86  --  95 87 85 80  BUN 29*  --  32* 28* 27* 28*  CREATININE 2.14*  --  1.99* 2.06* 2.01* 2.18*  CALCIUM  9.5  --  8.3* 8.8* 8.5* 9.0  MG  --  1.8 1.6*  --   --   --    Liver Function Tests: Recent Labs  Lab 05/21/24 1259  AST 28  ALT 15  ALKPHOS 55  BILITOT 0.5  PROT 7.7  ALBUMIN 4.1   No results for input(s): LIPASE, AMYLASE in the last 168 hours. Recent Labs  Lab 05/21/24 1607  AMMONIA 23   CBC: Recent Labs  Lab 05/21/24 1259 05/22/24 0521 05/23/24 0620  WBC 8.3 6.8 7.9  NEUTROABS 5.2  --   --   HGB 9.7* 8.1* 10.0*  HCT 32.8* 27.0* 33.0*  MCV 96.8 96.1 95.4  PLT 115* 115* 134*   Cardiac Enzymes: Recent Labs  Lab 05/21/24 1607  CKTOTAL 153   BNP: BNP (last 3 results) Recent Labs    02/01/24 1035  BNP 196.8*    ProBNP (last 3 results) Recent Labs    04/03/24 0359  PROBNP 3,058.0*    CBG: Recent Labs  Lab 05/22/24 0808 05/23/24 0735 05/24/24 0728 05/25/24 1546 05/26/24 0849  GLUCAP 70 71 83 109* 74       Signed:  Devaughn KATHEE Ban MD.  [facility] 05/26/2024, 11:14 AM     [1] No Known Allergies

## 2024-05-26 NOTE — Plan of Care (Signed)

## 2024-05-26 NOTE — Progress Notes (Signed)
 Attempted to call report to [facility]  Rehab@ [phone] no answer. [facility] here to transport patient.

## 2024-05-26 NOTE — Progress Notes (Signed)
 Brovana  discontinued due to patient receiving incruse ellipta .  Taking together could increase serious side effects

## 2024-05-28 NOTE — Telephone Encounter (Signed)
 Myles English the referral had been declined.

## 2024-05-28 NOTE — Telephone Encounter (Signed)
 Inbound call from [place] at [facility] center calling in regarding to schedule patient for EUS. Requesting a call at [phone] to discuss further details. Please advise, thank you

## 2024-05-30 ENCOUNTER — Other Ambulatory Visit: Payer: Self-pay

## 2024-05-30 ENCOUNTER — Telehealth: Payer: Self-pay

## 2024-05-30 MED ORDER — BISOPROLOL FUMARATE 2.5 MG PO TABS: 2.5000 mg | ORAL_TABLET | Freq: Every day | ORAL | 0 refills | Status: DC

## 2024-06-05 ENCOUNTER — Ambulatory Visit: Admitting: Family

## 2024-06-07 ENCOUNTER — Other Ambulatory Visit: Payer: Self-pay

## 2024-06-07 ENCOUNTER — Inpatient Hospital Stay (HOSPITAL_COMMUNITY)
Admission: EM | Admit: 2024-06-07 | Discharge: 2024-06-11 | DRG: 193 | Disposition: A | Discharge status: Skilled Nursing Facility | Source: Skilled Nursing Facility | Attending: Internal Medicine | Admitting: Internal Medicine

## 2024-06-07 ENCOUNTER — Emergency Department (HOSPITAL_COMMUNITY)

## 2024-06-07 DIAGNOSIS — K8689 Other specified diseases of pancreas: Secondary | ICD-10-CM | POA: Diagnosis present

## 2024-06-07 DIAGNOSIS — J441 Chronic obstructive pulmonary disease with (acute) exacerbation: Secondary | ICD-10-CM | POA: Diagnosis present

## 2024-06-07 DIAGNOSIS — Z681 Body mass index (BMI) 19 or less, adult: Secondary | ICD-10-CM

## 2024-06-07 DIAGNOSIS — Z1152 Encounter for screening for COVID-19: Secondary | ICD-10-CM

## 2024-06-07 DIAGNOSIS — G40909 Epilepsy, unspecified, not intractable, without status epilepticus: Secondary | ICD-10-CM | POA: Diagnosis present

## 2024-06-07 DIAGNOSIS — N1832 Chronic kidney disease, stage 3b: Secondary | ICD-10-CM | POA: Diagnosis present

## 2024-06-07 DIAGNOSIS — Z87891 Personal history of nicotine dependence: Secondary | ICD-10-CM

## 2024-06-07 DIAGNOSIS — J9621 Acute and chronic respiratory failure with hypoxia: Secondary | ICD-10-CM | POA: Diagnosis not present

## 2024-06-07 DIAGNOSIS — I5032 Chronic diastolic (congestive) heart failure: Secondary | ICD-10-CM | POA: Diagnosis present

## 2024-06-07 DIAGNOSIS — K869 Disease of pancreas, unspecified: Secondary | ICD-10-CM | POA: Diagnosis present

## 2024-06-07 DIAGNOSIS — K219 Gastro-esophageal reflux disease without esophagitis: Secondary | ICD-10-CM | POA: Diagnosis present

## 2024-06-07 DIAGNOSIS — Z9981 Dependence on supplemental oxygen: Secondary | ICD-10-CM

## 2024-06-07 DIAGNOSIS — R001 Bradycardia, unspecified: Secondary | ICD-10-CM | POA: Diagnosis not present

## 2024-06-07 DIAGNOSIS — Z79899 Other long term (current) drug therapy: Secondary | ICD-10-CM

## 2024-06-07 DIAGNOSIS — Z7984 Long term (current) use of oral hypoglycemic drugs: Secondary | ICD-10-CM

## 2024-06-07 DIAGNOSIS — J9601 Acute respiratory failure with hypoxia: Principal | ICD-10-CM

## 2024-06-07 DIAGNOSIS — J440 Chronic obstructive pulmonary disease with acute lower respiratory infection: Secondary | ICD-10-CM | POA: Diagnosis present

## 2024-06-07 DIAGNOSIS — J449 Chronic obstructive pulmonary disease, unspecified: Secondary | ICD-10-CM | POA: Diagnosis present

## 2024-06-07 DIAGNOSIS — Z87898 Personal history of other specified conditions: Secondary | ICD-10-CM

## 2024-06-07 DIAGNOSIS — R64 Cachexia: Secondary | ICD-10-CM | POA: Diagnosis present

## 2024-06-07 DIAGNOSIS — F0390 Unspecified dementia without behavioral disturbance: Secondary | ICD-10-CM | POA: Diagnosis present

## 2024-06-07 DIAGNOSIS — J9622 Acute and chronic respiratory failure with hypercapnia: Secondary | ICD-10-CM | POA: Diagnosis present

## 2024-06-07 DIAGNOSIS — I130 Hypertensive heart and chronic kidney disease with heart failure and stage 1 through stage 4 chronic kidney disease, or unspecified chronic kidney disease: Secondary | ICD-10-CM | POA: Diagnosis present

## 2024-06-07 DIAGNOSIS — R627 Adult failure to thrive: Secondary | ICD-10-CM | POA: Diagnosis present

## 2024-06-07 DIAGNOSIS — T447X5A Adverse effect of beta-adrenoreceptor antagonists, initial encounter: Secondary | ICD-10-CM | POA: Diagnosis not present

## 2024-06-07 DIAGNOSIS — I10 Essential (primary) hypertension: Secondary | ICD-10-CM | POA: Diagnosis present

## 2024-06-07 DIAGNOSIS — J189 Pneumonia, unspecified organism: Principal | ICD-10-CM | POA: Diagnosis present

## 2024-06-07 DIAGNOSIS — E43 Unspecified severe protein-calorie malnutrition: Secondary | ICD-10-CM | POA: Diagnosis present

## 2024-06-07 DIAGNOSIS — J9620 Acute and chronic respiratory failure, unspecified whether with hypoxia or hypercapnia: Secondary | ICD-10-CM | POA: Diagnosis present

## 2024-06-07 DIAGNOSIS — I5022 Chronic systolic (congestive) heart failure: Secondary | ICD-10-CM | POA: Diagnosis present

## 2024-06-07 DIAGNOSIS — J439 Emphysema, unspecified: Secondary | ICD-10-CM | POA: Diagnosis present

## 2024-06-07 DIAGNOSIS — E785 Hyperlipidemia, unspecified: Secondary | ICD-10-CM | POA: Diagnosis present

## 2024-06-07 DIAGNOSIS — E875 Hyperkalemia: Secondary | ICD-10-CM | POA: Diagnosis not present

## 2024-06-07 LAB — BLOOD GAS, ARTERIAL
Acid-Base Excess: 7.9 mmol/L — ABNORMAL HIGH (ref 0.0–2.0)
Bicarbonate: 34.3 mmol/L — ABNORMAL HIGH (ref 20.0–28.0)
Drawn by: 37588
O2 Saturation: 97.7 %
Patient temperature: 37
pCO2 arterial: 58 mmHg — ABNORMAL HIGH (ref 32–48)
pH, Arterial: 7.38 (ref 7.35–7.45)
pO2, Arterial: 63 mmHg — ABNORMAL LOW (ref 83–108)

## 2024-06-07 LAB — CBC WITH DIFFERENTIAL/PLATELET
Abs Immature Granulocytes: 0.04 10*3/uL (ref 0.00–0.07)
Basophils Absolute: 0 10*3/uL (ref 0.0–0.1)
Basophils Relative: 0 %
Eosinophils Absolute: 0 10*3/uL (ref 0.0–0.5)
Eosinophils Relative: 0 %
HCT: 33.8 % — ABNORMAL LOW (ref 39.0–52.0)
Hemoglobin: 10.2 g/dL — ABNORMAL LOW (ref 13.0–17.0)
Immature Granulocytes: 0 %
Lymphocytes Relative: 36 %
Lymphs Abs: 4.5 10*3/uL — ABNORMAL HIGH (ref 0.7–4.0)
MCH: 28.8 pg (ref 26.0–34.0)
MCHC: 30.2 g/dL (ref 30.0–36.0)
MCV: 95.5 fL (ref 80.0–100.0)
Monocytes Absolute: 0.4 10*3/uL (ref 0.1–1.0)
Monocytes Relative: 3 %
Neutro Abs: 7.3 10*3/uL (ref 1.7–7.7)
Neutrophils Relative %: 61 %
Platelets: 154 10*3/uL (ref 150–400)
RBC: 3.54 MIL/uL — ABNORMAL LOW (ref 4.22–5.81)
RDW: 13.3 % (ref 11.5–15.5)
WBC: 12.2 10*3/uL — ABNORMAL HIGH (ref 4.0–10.5)
nRBC: 0 % (ref 0.0–0.2)

## 2024-06-07 LAB — TROPONIN T, HIGH SENSITIVITY
Troponin T High Sensitivity: 123 ng/L (ref 0–19)
Troponin T High Sensitivity: 117 ng/L (ref 0–19)

## 2024-06-07 LAB — RESP PANEL BY RT-PCR (RSV, FLU A&B, COVID)  RVPGX2
Influenza A by PCR: NEGATIVE
Influenza B by PCR: NEGATIVE
Resp Syncytial Virus by PCR: NEGATIVE
SARS Coronavirus 2 by RT PCR: NEGATIVE

## 2024-06-07 LAB — BASIC METABOLIC PANEL WITH GFR
Anion gap: 12 (ref 5–15)
BUN: 28 mg/dL — ABNORMAL HIGH (ref 8–23)
CO2: 28 mmol/L (ref 22–32)
Calcium: 9.4 mg/dL (ref 8.9–10.3)
Chloride: 103 mmol/L (ref 98–111)
Creatinine, Ser: 1.9 mg/dL — ABNORMAL HIGH (ref 0.61–1.24)
GFR, Estimated: 39 mL/min — ABNORMAL LOW
Glucose, Bld: 99 mg/dL (ref 70–99)
Potassium: 4.7 mmol/L (ref 3.5–5.1)
Sodium: 143 mmol/L (ref 135–145)

## 2024-06-07 LAB — PRO BRAIN NATRIURETIC PEPTIDE: Pro Brain Natriuretic Peptide: 29580 pg/mL — ABNORMAL HIGH

## 2024-06-07 LAB — MRSA NEXT GEN BY PCR, NASAL: MRSA by PCR Next Gen: NOT DETECTED

## 2024-06-07 MED ORDER — SODIUM CHLORIDE 0.9 % IV SOLN
1.0000 g | Freq: Once | INTRAVENOUS | Status: AC
  Administered 2024-06-07: 1 g via INTRAVENOUS
  Filled 2024-06-07: qty 10

## 2024-06-07 MED ORDER — CHLORHEXIDINE GLUCONATE CLOTH 2 % EX PADS
6.0000 | MEDICATED_PAD | Freq: Every day | CUTANEOUS | Status: DC
  Administered 2024-06-07 – 2024-06-11 (×4): 6 via TOPICAL

## 2024-06-07 MED ORDER — IPRATROPIUM-ALBUTEROL 0.5-2.5 (3) MG/3ML IN SOLN
3.0000 mL | Freq: Three times a day (TID) | RESPIRATORY_TRACT | Status: DC
  Administered 2024-06-08 – 2024-06-09 (×4): 3 mL via RESPIRATORY_TRACT
  Filled 2024-06-07 (×4): qty 3

## 2024-06-07 MED ORDER — FOLIC ACID 1 MG PO TABS
1.0000 mg | ORAL_TABLET | Freq: Every day | ORAL | Status: DC
  Administered 2024-06-08 – 2024-06-11 (×4): 1 mg via ORAL
  Filled 2024-06-07 (×4): qty 1

## 2024-06-07 MED ORDER — VITAMIN D 25 MCG (1000 UNIT) PO TABS
1000.0000 [IU] | ORAL_TABLET | Freq: Every day | ORAL | Status: DC
  Administered 2024-06-08 – 2024-06-11 (×4): 1000 [IU] via ORAL
  Filled 2024-06-07 (×4): qty 1

## 2024-06-07 MED ORDER — THIAMINE MONONITRATE 100 MG PO TABS
100.0000 mg | ORAL_TABLET | Freq: Every day | ORAL | Status: DC
  Administered 2024-06-08 – 2024-06-11 (×4): 100 mg via ORAL
  Filled 2024-06-07 (×5): qty 1

## 2024-06-07 MED ORDER — PANTOPRAZOLE SODIUM 40 MG PO TBEC
40.0000 mg | DELAYED_RELEASE_TABLET | Freq: Every day | ORAL | Status: DC
  Administered 2024-06-08 – 2024-06-11 (×4): 40 mg via ORAL
  Filled 2024-06-07 (×4): qty 1

## 2024-06-07 MED ORDER — LEVETIRACETAM 500 MG PO TABS
750.0000 mg | ORAL_TABLET | Freq: Two times a day (BID) | ORAL | Status: DC
  Administered 2024-06-07 – 2024-06-11 (×8): 750 mg via ORAL
  Filled 2024-06-07 (×8): qty 1

## 2024-06-07 MED ORDER — SODIUM CHLORIDE 0.9 % IV SOLN
2.0000 g | INTRAVENOUS | Status: DC
  Administered 2024-06-08 – 2024-06-10 (×3): 2 g via INTRAVENOUS
  Filled 2024-06-07 (×3): qty 20

## 2024-06-07 MED ORDER — AZITHROMYCIN 250 MG PO TABS
500.0000 mg | ORAL_TABLET | Freq: Once | ORAL | Status: AC
  Administered 2024-06-07: 500 mg via ORAL
  Filled 2024-06-07: qty 2

## 2024-06-07 MED ORDER — ATORVASTATIN CALCIUM 40 MG PO TABS
20.0000 mg | ORAL_TABLET | Freq: Every evening | ORAL | Status: DC
  Administered 2024-06-07 – 2024-06-10 (×4): 20 mg via ORAL
  Filled 2024-06-07 (×4): qty 1

## 2024-06-07 MED ORDER — METHYLPREDNISOLONE SODIUM SUCC 125 MG IJ SOLR
125.0000 mg | Freq: Every day | INTRAMUSCULAR | Status: DC
  Administered 2024-06-08: 125 mg via INTRAVENOUS
  Filled 2024-06-07: qty 2

## 2024-06-07 MED ORDER — HEPARIN SODIUM (PORCINE) 5000 UNIT/ML IJ SOLN
5000.0000 [IU] | Freq: Three times a day (TID) | INTRAMUSCULAR | Status: DC
  Administered 2024-06-07 – 2024-06-11 (×11): 5000 [IU] via SUBCUTANEOUS
  Filled 2024-06-07 (×11): qty 1

## 2024-06-07 MED ORDER — TRAZODONE HCL 50 MG PO TABS
50.0000 mg | ORAL_TABLET | Freq: Every day | ORAL | Status: DC
  Administered 2024-06-07 – 2024-06-10 (×4): 50 mg via ORAL
  Filled 2024-06-07 (×4): qty 1

## 2024-06-07 MED ORDER — IPRATROPIUM-ALBUTEROL 0.5-2.5 (3) MG/3ML IN SOLN
3.0000 mL | RESPIRATORY_TRACT | Status: DC
  Administered 2024-06-07: 3 mL via RESPIRATORY_TRACT
  Filled 2024-06-07: qty 3

## 2024-06-07 MED ORDER — BISOPROLOL FUMARATE 5 MG PO TABS
2.5000 mg | ORAL_TABLET | Freq: Every day | ORAL | Status: DC
  Administered 2024-06-07 – 2024-06-08 (×2): 2.5 mg via ORAL
  Filled 2024-06-07 (×2): qty 1

## 2024-06-07 MED ORDER — ADULT MULTIVITAMIN W/MINERALS CH
1.0000 | ORAL_TABLET | Freq: Every day | ORAL | Status: DC
  Administered 2024-06-08 – 2024-06-11 (×4): 1 via ORAL
  Filled 2024-06-07 (×4): qty 1

## 2024-06-07 MED ORDER — ALBUTEROL SULFATE (2.5 MG/3ML) 0.083% IN NEBU
2.5000 mg | INHALATION_SOLUTION | RESPIRATORY_TRACT | Status: DC | PRN
  Administered 2024-06-08 (×2): 2.5 mg via RESPIRATORY_TRACT
  Filled 2024-06-07 (×2): qty 3

## 2024-06-07 MED ORDER — LORAZEPAM 2 MG/ML IJ SOLN
0.5000 mg | Freq: Once | INTRAMUSCULAR | Status: AC
  Administered 2024-06-07: 0.5 mg via INTRAVENOUS
  Filled 2024-06-07: qty 1

## 2024-06-07 MED ORDER — FERROUS SULFATE 325 (65 FE) MG PO TABS
325.0000 mg | ORAL_TABLET | Freq: Every day | ORAL | Status: DC
  Administered 2024-06-08 – 2024-06-11 (×4): 325 mg via ORAL
  Filled 2024-06-07 (×4): qty 1

## 2024-06-07 MED ORDER — AZITHROMYCIN 250 MG PO TABS
500.0000 mg | ORAL_TABLET | Freq: Every day | ORAL | Status: DC
  Administered 2024-06-08 – 2024-06-11 (×4): 500 mg via ORAL
  Filled 2024-06-07 (×4): qty 2

## 2024-06-07 MED ORDER — DAPAGLIFLOZIN PROPANEDIOL 10 MG PO TABS
10.0000 mg | ORAL_TABLET | Freq: Every day | ORAL | Status: DC
  Administered 2024-06-08 – 2024-06-11 (×4): 10 mg via ORAL
  Filled 2024-06-07 (×5): qty 1

## 2024-06-07 MED ORDER — METHYLPREDNISOLONE SODIUM SUCC 125 MG IJ SOLR
125.0000 mg | Freq: Once | INTRAMUSCULAR | Status: AC
  Administered 2024-06-07: 125 mg via INTRAVENOUS
  Filled 2024-06-07: qty 2

## 2024-06-07 MED ORDER — ACETAMINOPHEN 325 MG PO TABS: 650.0000 mg | ORAL_TABLET | Freq: Four times a day (QID) | ORAL | Status: DC | PRN

## 2024-06-07 NOTE — H&P (Signed)
 "                                                                                                          [facility] H&P   Patient Demographics:    Darin Hopkins, is a 64 y.o. male  MRN: [id number]   DOB - 24-Feb-1961  Admit Date - 06/07/2024  Outpatient Primary MD for the patient is [name], NP   Chief Complaint  Patient presents with   Shortness of Breath      HPI:    Darin Hopkins  is a 64 y.o. male, with medical history of hypertension, hyperlipidemia, chronic respiratory failure with hypoxia and hypercapnia secondary to COPD, CHF, with recent hospitalization for respiratory failure due to COPD, discharged to SNF facility.   - Patient was sent by his facility due to shortness of breath and increased work of breathing, patient is poor historian, but to answer some questions, sister was obtained with assistance of ED staff as well, patient with known history of COPD, 3 L oxygen, reported by the facility he was hypoxic 60%, with increased work of breathing, received nebulizer treatment with improvement of his symptoms, upon presentation to ED he was saturating in the 80s, started on nonrebreather. - In ED patient with increased work of breathing, started on BiPAP, ABG significant for pCO2 of 58, pO2 of 63, creatinine at baseline 1.9, proBNP significantly elevated at 29,580, troponin is elevated at 123> 117 (lower than baseline, had leukocytosis at 12.2, negative COVID and influenza, CT chest without contrast significant for severe emphysema, and bibasilar consolidation, improved on BiPAP, [facility] consulted to admit    Review of systems:      A full 10 point Review of Systems was done, except as stated above, all other Review of Systems were negative.   With Past History of the following :    Past Medical History:  Diagnosis Date   CHF (congestive heart failure) (HCC)    COPD (chronic obstructive pulmonary disease) (HCC)    Hypertension    Seizures (HCC)       No  past surgical history on file.    Social History:     Social History   Tobacco Use   Smoking status: Former    Current packs/day: 0.00    Average packs/day: 2.0 packs/day    Types: Cigarettes    Quit date: 2021    Years since quitting: 5.0   Smokeless tobacco: Never  Substance Use Topics   Alcohol use: Yes    Alcohol/week: 3.0 standard drinks of alcohol    Types: 3 Cans of beer per week    Comment: daily        Family History :   Family history was reviewed, nonpertinent   Home Medications:   Prior to Admission medications  Medication Sig Start Date End Date Taking? Authorizing Provider  acetaminophen  (TYLENOL ) 325 MG tablet Take 2 tablets (650 mg total) by mouth every 6 (six) hours as needed for mild pain (pain score 1-3) or fever (or Fever >/= 101). 04/12/24  Josette Ade, MD  atorvastatin  (LIPITOR) 20 MG tablet Take 1 tablet (20 mg total) by mouth every evening. 04/16/24   Josette Ade, MD  Bisoprolol  Fumarate 2.5 MG TABS Take 2.5 mg by mouth at bedtime. Call [phone] for appt. 05/30/24   Donette Ellouise LABOR, FNP  cholecalciferol (VITAMIN D3) 25 MCG (1000 UNIT) tablet Take 1 tablet (1,000 Units total) by mouth daily. 04/16/24   Josette Ade, MD  FARXIGA  10 MG TABS tablet Take 1 tablet (10 mg total) by mouth daily before breakfast. 05/25/24   Donette Ellouise LABOR, FNP  FEROSUL 325 (65 Fe) MG tablet Take 1 tablet (325 mg total) by mouth daily with breakfast. 04/16/24   Josette Ade, MD  folic acid  (FOLVITE ) 1 MG tablet Take 1 tablet (1 mg total) by mouth daily. 04/16/24   Josette Ade, MD  levETIRAcetam  (KEPPRA ) 750 MG tablet Take 1 tablet (750 mg total) by mouth 2 (two) times daily. 04/16/24   Josette Ade, MD  Multiple Vitamin (MULTIVITAMIN ADULT) TABS Take 2 tablets by mouth daily. 04/16/24   Josette Ade, MD  omeprazole  (PRILOSEC) 20 MG capsule Take 1 capsule (20 mg total) by mouth daily before breakfast. 04/16/24   Josette Ade, MD  PROAIR  HFA 108  (90 Base) MCG/ACT inhaler Inhale 2 puffs into the lungs every 6 (six) hours as needed for shortness of breath or wheezing. 04/16/24   Josette Ade, MD  thiamine  (VITAMIN B1) 100 MG tablet Take 1 tablet (100 mg total) by mouth daily. 04/16/24   Josette Ade, MD  Tiotropium Bromide -Olodaterol (STIOLTO RESPIMAT ) 2.5-2.5 MCG/ACT AERS Inhale 2 puffs into the lungs daily. Pt needs to make an appointment to continue to receive refills please. 04/16/24   Josette Ade, MD  traZODone  (DESYREL ) 50 MG tablet Take 1 tablet (50 mg total) by mouth at bedtime. 04/16/24   Josette Ade, MD     Allergies:    Allergies[1]   Physical Exam:   Vitals  Blood pressure (!) 174/91, pulse (!) 109, temperature 98.1 F (36.7 C), temperature source Oral, resp. rate (!) 23, height 5' 7 (1.702 m), weight 56.5 kg, SpO2 92%.   1. General Frail, elderly male, laying in bed in no apparent distress, cachectic  2.  Awake, alert, oriented x 2, answering most questions appropriately but remains confused   3. No F.N deficits, ALL C.Nerves Intact, Strength 5/5 all 4 extremities, Sensation intact all 4 extremities, Plantars down going.  4. Ears and Eyes appear Normal, Conjunctivae clear, PERRLA. Moist Oral Mucosa.  5. Supple Neck,  No Carotid Bruits.  6. Symmetrical Chest wall movement, Rales and rhonchi, mildly tachypneic  7. RRR, No Gallops, Rubs or Murmurs, No Parasternal Heave.  8. Positive Bowel Sounds, Abdomen Soft, No tenderness, No organomegaly appriciated,No rebound -guarding or rigidity.  9.  No Cyanosis, Normal Skin Turgor, No Skin Rash or Bruise.  10.  joints appear normal , no effusions, Normal ROM.     Data Review:    CBC Recent Labs  Lab 06/07/24 1641  WBC 12.2*  HGB 10.2*  HCT 33.8*  PLT 154  MCV 95.5  MCH 28.8  MCHC 30.2  RDW 13.3  LYMPHSABS 4.5*  MONOABS 0.4  EOSABS 0.0  BASOSABS 0.0    ------------------------------------------------------------------------------------------------------------------  Chemistries  Recent Labs  Lab 06/07/24 1641  NA 143  K 4.7  CL 103  CO2 28  GLUCOSE 99  BUN 28*  CREATININE 1.90*  CALCIUM  9.4   ------------------------------------------------------------------------------------------------------------------ estimated creatinine clearance is 31.8 mL/min (A) (by C-G  formula based on SCr of 1.9 mg/dL (H)). ------------------------------------------------------------------------------------------------------------------ No results for input(s): TSH, T4TOTAL, T3FREE, THYROIDAB in the last 72 hours.  Invalid input(s): FREET3  Coagulation profile No results for input(s): INR, PROTIME in the last 168 hours. ------------------------------------------------------------------------------------------------------------------- No results for input(s): DDIMER in the last 72 hours. -------------------------------------------------------------------------------------------------------------------  Cardiac Enzymes No results for input(s): CKMB, TROPONINI, MYOGLOBIN in the last 168 hours.  Invalid input(s): CK ------------------------------------------------------------------------------------------------------------------    Component Value Date/Time   BNP 196.8 (H) 02/01/2024 1035     ---------------------------------------------------------------------------------------------------------------  Urinalysis    Component Value Date/Time   COLORURINE YELLOW (A) 05/21/2024 1607   APPEARANCEUR HAZY (A) 05/21/2024 1607   APPEARANCEUR Clear 11/02/2013 0205   LABSPEC 1.015 05/21/2024 1607   LABSPEC 1.005 11/02/2013 0205   PHURINE 5.0 05/21/2024 1607   GLUCOSEU 150 (A) 05/21/2024 1607   GLUCOSEU Negative 11/02/2013 0205   HGBUR SMALL (A) 05/21/2024 1607   BILIRUBINUR NEGATIVE 05/21/2024 1607   BILIRUBINUR  Negative 11/02/2013 0205   KETONESUR NEGATIVE 05/21/2024 1607   PROTEINUR 100 (A) 05/21/2024 1607   NITRITE NEGATIVE 05/21/2024 1607   LEUKOCYTESUR NEGATIVE 05/21/2024 1607   LEUKOCYTESUR Negative 11/02/2013 0205    ----------------------------------------------------------------------------------------------------------------   Imaging Results:    CT Chest Wo Contrast Result Date: 06/07/2024 EXAM: CT CHEST WITHOUT CONTRAST 06/07/2024 05:36:12 PM TECHNIQUE: CT of the chest was performed without the administration of intravenous contrast. Multiplanar reformatted images are provided for review. Automated exposure control, iterative reconstruction, and/or weight based adjustment of the mA/kV was utilized to reduce the radiation dose to as low as reasonably achievable. COMPARISON: Comparison with chest radiograph 06/07/2024 and CT chest 04/03/2024. CLINICAL HISTORY: sob Shortness of breath. Decreased oxygen saturation oxygen at baseline. FINDINGS: MEDIASTINUM: Cardiac enlargement. Small pericardial effusions. Calcification of the coronary arteries. Ascending thoracic aorta measuring 4.6 cm in diameter, unchanged. Calcification of the aorta. The central airways are clear. LYMPH NODES: No mediastinal, hilar or axillary lymphadenopathy. LUNGS AND PLEURA: Severe diffuse emphysematous changes throughout the lungs. Airspace consolidative changes in both lung bases, greater on the left. This could represent compressive atelectasis or pneumonia. Small left pleural effusion. No pneumothorax. SOFT TISSUES/BONES: No acute abnormality of the bones or soft tissues. UPPER ABDOMEN: Limited images of the upper abdomen demonstrate a lesion in the lateral wall of the right kidney measuring 2.9 cm in diameter and containing calcification. This is unchanged since the prior study. On prior MRI, this was characterized as an indeterminate lesion and additional follow-up in 6 months is recommended. IMPRESSION: 1. Airspace  consolidative changes in both lung bases, greater on the left, which may represent compressive atelectasis or pneumonia. 2. Small left pleural effusion. 3. Severe diffuse emphysematous changes throughout the lungs. Pulmonary emphysema is an independent risk factor for lung cancer. Recommend consideration for evaluation for a low-dose CT lung cancer screening program. 4. Cardiac enlargement and small pericardial effusions. 5. An indeterminate right renal lesion is again demonstrated without change in size. A 32-month follow-up is recommended . Electronically signed by: Elsie Gravely MD 06/07/2024 06:13 PM EST RP Workstation: [id number]   DG Chest 1 View Result Date: 06/07/2024 EXAM: 1 VIEW(S) XRAY OF THE CHEST 06/07/2024 04:54:00 PM COMPARISON: 05/21/2024 CLINICAL HISTORY: Shortness of breath. FINDINGS: LUNGS AND PLEURA: Hyperexpanded lungs with findings suggestive of emphysema. Redemonstrated small left pleural effusion with retrocardiac airspace opacities, which may represent atelectasis or aspiration. No pneumothorax. HEART AND MEDIASTINUM: Tortuous aorta with aortic atherosclerosis. No acute abnormality of the cardiac and mediastinal silhouettes. BONES AND SOFT TISSUES: Multilevel thoracic  osteophytosis. IMPRESSION: 1. Redemonstrated small left pleural effusion with retrocardiac airspace opacities, which may represent atelectasis, bronchopneumonia, or aspiration. 2. Hyperexpanded lungs with findings suggestive of emphysema. Pneumonia Electronically signed by: Rogelia Myers MD 06/07/2024 05:45 PM EST RP Workstation: [id number]    My personal review of EKG: Rhythm sinus tachycardia, Rate   103/min, QTc 481   Assessment & Plan:    Principal Problem:   Acute on chronic respiratory failure (HCC) Active Problems:   COPD (chronic obstructive pulmonary disease) (HCC)   Pancreatic mass   Essential hypertension   History of seizure   Hyperlipidemia   GERD (gastroesophageal reflux disease)    Protein-calorie malnutrition, severe   Chronic diastolic CHF (congestive heart failure) (HCC)   Failure to thrive in adult  Acute on acute respiratory failure with hypoxemia and hypercapnia Multifocal pneumonia COPD exacerbation -Patient hypoxic, hypercapnic with increased work of breathing - CT chest significant for bibasilar pneumonia - Will start on IV Rocephin  and azithromycin , he was encouraged use incentive spirometry and flutter valve. - Significant wheezing on presentation as well, continue with IV steroids. - Continue scheduled DuoNebs and as needed albuterol . - Currently on 6 L oxygen, off BiPAP, will keep on BiPAP overnight and as needed daytime  CKD 3B - Renal function at baseline   Chronic HFrEF - Endo elevated proBNP, but he appears euvolemic on exam and imaging   Hypertension - Pressure mildly elevated, continue with home bisoprolol , will add as needed hydralazine    Pancreatic mass -Diagnosis of pancreatic mass during recent imaging, per discharge recommendation to follow-up with GI as an outpatient   Failure to thrive  Protein calorie malnutrition  Deconditioning  - Will consult PT/OT   - Will consult nutritionist   Seizure disorder - Continue with Keppra  keppra    Hyperlipidemia  - Continue with statin  GERD -Continue with PPI     DVT Prophylaxis Heparin  -  AM Labs Ordered, also please review Full Orders  Family Communication: Admission, patients condition and plan of care including tests being ordered have been discussed with the patient and sister by phone who indicate understanding and agree with the plan and Code Status.  Code Status Full  Likely DC to  back to SNF  Consults called: none    Admission status: inpatient    Time spent in minutes : 70 minutes   Brayton Lye M.D on 06/07/2024 at 7:19 PM   [facility] - Office  [phone]        [1] No Known Allergies  "

## 2024-06-07 NOTE — Progress Notes (Signed)
 Went to check on patient.  He was off Bipap and was sitting up in bed eating.  Checked machine and told patient that if he started having more distress with his breathing, we would put him back on his machine.  Patient is on 4L with sat of 96%.

## 2024-06-07 NOTE — ED Triage Notes (Signed)
 Pt arrived [facility] from [facility] with c/o SOB. Pt oxygen is 3 lpm baseline and was down into the 80'S. Pt has laboured breathing. EDP at bedside and heard report from [facility].

## 2024-06-08 DIAGNOSIS — J441 Chronic obstructive pulmonary disease with (acute) exacerbation: Secondary | ICD-10-CM | POA: Diagnosis not present

## 2024-06-08 DIAGNOSIS — E43 Unspecified severe protein-calorie malnutrition: Secondary | ICD-10-CM | POA: Diagnosis not present

## 2024-06-08 DIAGNOSIS — K219 Gastro-esophageal reflux disease without esophagitis: Secondary | ICD-10-CM

## 2024-06-08 DIAGNOSIS — J449 Chronic obstructive pulmonary disease, unspecified: Secondary | ICD-10-CM | POA: Diagnosis not present

## 2024-06-08 DIAGNOSIS — Z87898 Personal history of other specified conditions: Secondary | ICD-10-CM

## 2024-06-08 DIAGNOSIS — E782 Mixed hyperlipidemia: Secondary | ICD-10-CM

## 2024-06-08 DIAGNOSIS — R627 Adult failure to thrive: Secondary | ICD-10-CM | POA: Diagnosis not present

## 2024-06-08 DIAGNOSIS — I10 Essential (primary) hypertension: Secondary | ICD-10-CM

## 2024-06-08 DIAGNOSIS — I5032 Chronic diastolic (congestive) heart failure: Secondary | ICD-10-CM | POA: Diagnosis not present

## 2024-06-08 DIAGNOSIS — J9622 Acute and chronic respiratory failure with hypercapnia: Secondary | ICD-10-CM | POA: Diagnosis not present

## 2024-06-08 DIAGNOSIS — J9621 Acute and chronic respiratory failure with hypoxia: Secondary | ICD-10-CM | POA: Diagnosis not present

## 2024-06-08 LAB — CBC
HCT: 29.2 % — ABNORMAL LOW (ref 39.0–52.0)
Hemoglobin: 9 g/dL — ABNORMAL LOW (ref 13.0–17.0)
MCH: 28.7 pg (ref 26.0–34.0)
MCHC: 30.8 g/dL (ref 30.0–36.0)
MCV: 93 fL (ref 80.0–100.0)
Platelets: 146 10*3/uL — ABNORMAL LOW (ref 150–400)
RBC: 3.14 MIL/uL — ABNORMAL LOW (ref 4.22–5.81)
RDW: 13.5 % (ref 11.5–15.5)
WBC: 6.1 10*3/uL (ref 4.0–10.5)
nRBC: 0 % (ref 0.0–0.2)

## 2024-06-08 LAB — BASIC METABOLIC PANEL WITH GFR
Anion gap: 11 (ref 5–15)
BUN: 29 mg/dL — ABNORMAL HIGH (ref 8–23)
CO2: 28 mmol/L (ref 22–32)
Calcium: 9.3 mg/dL (ref 8.9–10.3)
Chloride: 104 mmol/L (ref 98–111)
Creatinine, Ser: 1.84 mg/dL — ABNORMAL HIGH (ref 0.61–1.24)
GFR, Estimated: 41 mL/min — ABNORMAL LOW
Glucose, Bld: 118 mg/dL — ABNORMAL HIGH (ref 70–99)
Potassium: 5.1 mmol/L (ref 3.5–5.1)
Sodium: 142 mmol/L (ref 135–145)

## 2024-06-08 MED ORDER — JUVEN PO PACK
1.0000 | PACK | Freq: Two times a day (BID) | ORAL | Status: DC
  Administered 2024-06-08 – 2024-06-10 (×3): 1 via ORAL
  Filled 2024-06-08 (×5): qty 1

## 2024-06-08 MED ORDER — AMLODIPINE BESYLATE 5 MG PO TABS
10.0000 mg | ORAL_TABLET | Freq: Every day | ORAL | Status: DC
  Administered 2024-06-08 – 2024-06-11 (×4): 10 mg via ORAL
  Filled 2024-06-08 (×4): qty 2

## 2024-06-08 MED ORDER — HYDRALAZINE HCL 20 MG/ML IJ SOLN
10.0000 mg | INTRAMUSCULAR | Status: DC | PRN
  Administered 2024-06-08: 10 mg via INTRAVENOUS
  Filled 2024-06-08: qty 1

## 2024-06-08 MED ORDER — VITAMIN C 500 MG PO TABS
500.0000 mg | ORAL_TABLET | Freq: Two times a day (BID) | ORAL | Status: DC
  Administered 2024-06-08 – 2024-06-11 (×6): 500 mg via ORAL
  Filled 2024-06-08 (×6): qty 1

## 2024-06-08 MED ORDER — ZINC SULFATE 220 (50 ZN) MG PO CAPS
220.0000 mg | ORAL_CAPSULE | Freq: Every day | ORAL | Status: DC
  Administered 2024-06-08 – 2024-06-11 (×4): 220 mg via ORAL
  Filled 2024-06-08 (×4): qty 1

## 2024-06-08 MED ORDER — HYDRALAZINE HCL 25 MG PO TABS
25.0000 mg | ORAL_TABLET | Freq: Three times a day (TID) | ORAL | Status: DC
  Administered 2024-06-08 – 2024-06-09 (×3): 25 mg via ORAL
  Filled 2024-06-08 (×3): qty 1

## 2024-06-08 MED ORDER — METHYLPREDNISOLONE SODIUM SUCC 40 MG IJ SOLR
40.0000 mg | Freq: Every day | INTRAMUSCULAR | Status: DC
  Administered 2024-06-09: 40 mg via INTRAVENOUS
  Filled 2024-06-08: qty 1

## 2024-06-08 NOTE — Evaluation (Signed)
 Occupational Therapy Evaluation Patient Details Name: Darin Hopkins MRN: [id number] DOB: Aug 23, 1960 Today's Date: 06/08/2024   History of Present Illness   Darin Hopkins  is a 64 y.o. male, with medical history of hypertension, hyperlipidemia, chronic respiratory failure with hypoxia and hypercapnia secondary to COPD, CHF, with recent hospitalization for respiratory failure due to COPD, discharged to SNF facility.    - Patient was sent by his facility due to shortness of breath and increased work of breathing, patient is poor historian, but to answer some questions, sister was obtained with assistance of ED staff as well, patient with known history of COPD, 3 L oxygen, reported by the facility he was hypoxic 60%, with increased work of breathing, received nebulizer treatment with improvement of his symptoms, upon presentation to ED he was saturating in the 80s, started on nonrebreather. (per MD)     Clinical Impressions Pt agreeable to OT and PT co-evaluation. Pt from SNF and has been assisted for ADL's by staff. Today pt required min to mod A with RW for ambulation to toilet. Assisted for peri -care after attempted bowel movement from the pt. Pt's B UE are generally weak today. Pt demonstrates ability to reach B LE but states he struggles with lower body dressing. Per clinical judgement pt likely at level of mod A for lower body dressing and set up assist for upper body dressing. Pt left in the chair with call bell within reach. Pt will benefit from continued OT in the hospital to increase strength, balance, and endurance for safe ADL's.        If plan is discharge home, recommend the following:   A lot of help with bathing/dressing/bathroom;Help with stairs or ramp for entrance;A little help with walking and/or transfers     Functional Status Assessment   Patient has had a recent decline in their functional status and demonstrates the ability to make significant improvements in function  in a reasonable and predictable amount of time.     Equipment Recommendations   None recommended by OT             Precautions/Restrictions   Precautions Precautions: Fall Recall of Precautions/Restrictions: Intact Restrictions Weight Bearing Restrictions Per Provider Order: No     Mobility Bed Mobility Overal bed mobility: Needs Assistance Bed Mobility: Supine to Sit, Sit to Supine     Supine to sit: Contact guard     General bed mobility comments: Labored movement.    Transfers Overall transfer level: Needs assistance Equipment used: Rolling walker (2 wheels) Transfers: Sit to/from Stand, Bed to chair/wheelchair/BSC Sit to Stand: Min assist           General transfer comment: Sit to stand from EOB followed by ambulation to the toilet with RW.      Balance Overall balance assessment: Needs assistance Sitting-balance support: No upper extremity supported, Feet supported Sitting balance-Leahy Scale: Good Sitting balance - Comments: seated at EOB   Standing balance support: During functional activity, Reliant on assistive device for balance, Bilateral upper extremity supported Standing balance-Leahy Scale: Poor Standing balance comment: poor to fair with RW                           ADL either performed or assessed with clinical judgement   ADL Overall ADL's : Needs assistance/impaired     Grooming: Set up;Sitting   Upper Body Bathing: Set up;Sitting   Lower Body Bathing: Moderate assistance;Sitting/lateral leans   Upper  Body Dressing : Set up;Sitting   Lower Body Dressing: Moderate assistance;Sitting/lateral leans Lower Body Dressing Details (indicate cue type and reason): Based on pt's ability to reach ankles while seated in recliner. Toilet Transfer: Minimal assistance;Moderate assistance;Rolling walker (2 wheels);Ambulation Toilet Transfer Details (indicate cue type and reason): Ambulatory transfer to toilet with RW. Toileting-  Clothing Manipulation and Hygiene: Maximal assistance;Sit to/from stand       Functional mobility during ADLs: Minimal assistance;Moderate assistance;Rolling walker (2 wheels)       Vision Baseline Vision/History: 0 No visual deficits Ability to See in Adequate Light: 0 Adequate Patient Visual Report: No change from baseline Vision Assessment?: No apparent visual deficits     Perception Perception: Not tested       Praxis Praxis: Not tested       Pertinent Vitals/Pain Pain Assessment Pain Assessment: No/denies pain     Extremity/Trunk Assessment Upper Extremity Assessment Upper Extremity Assessment: Generalized weakness   Lower Extremity Assessment Lower Extremity Assessment: Defer to PT evaluation   Cervical / Trunk Assessment Cervical / Trunk Assessment: Kyphotic   Communication Communication Communication: Impaired Factors Affecting Communication: Hearing impaired   Cognition Arousal: Alert Behavior During Therapy: WFL for tasks assessed/performed Cognition: No apparent impairments                               Following commands: Intact       Cueing  General Comments   Cueing Techniques: Verbal cues;Tactile cues                 Home Living Family/patient expects to be discharged to:: Skilled nursing facility                                        Prior Functioning/Environment Prior Level of Function : Needs assist       Physical Assist : Mobility (physical);ADLs (physical) Mobility (physical): Transfers;Gait ADLs (physical): Grooming;Bathing;Dressing;Toileting;IADLs Mobility Comments: Ambualtes with RW ADLs Comments: Assisted by SNF staff. Pt reports prior to SNF he was able to assist some with ADL's like washing his face.    OT Problem List: Decreased strength;Decreased activity tolerance;Impaired balance (sitting and/or standing)   OT Treatment/Interventions: Therapeutic exercise;Therapeutic  activities;Self-care/ADL training;Energy conservation;DME and/or AE instruction;Patient/family education;Balance training      OT Goals(Current goals can be found in the care plan section)   Acute Rehab OT Goals Patient Stated Goal: Improve function. OT Goal Formulation: With patient Time For Goal Achievement: 06/22/24 Potential to Achieve Goals: Good   OT Frequency:  Min 2X/week    Co-evaluation PT/OT/SLP Co-Evaluation/Treatment: Yes Reason for Co-Treatment: To address functional/ADL transfers   OT goals addressed during session: ADL's and self-care                       End of Session Equipment Utilized During Treatment: Oxygen;Rolling walker (2 wheels)  Activity Tolerance: Patient tolerated treatment well Patient left: in chair;with call bell/phone within reach  OT Visit Diagnosis: Unsteadiness on feet (R26.81);Other abnormalities of gait and mobility (R26.89);Muscle weakness (generalized) (M62.81)                Time: 9191-9173 OT Time Calculation (min): 18 min Charges:  OT General Charges $OT Visit: 1 Visit OT Evaluation $OT Eval Low Complexity: 1 Low  Baylen Buckner OT, MOT   Jayson Person 06/08/2024,  10:54 AM

## 2024-06-08 NOTE — Progress Notes (Signed)
 Initial Nutrition Assessment  DOCUMENTATION CODES:   Not applicable  INTERVENTION:   -Continue regular diet -Magic cup TID with meals, each supplement provides 290 kcal and 9 grams of protein  -1 packet Juven BID, each packet provides 95 calories, 2.5 grams of protein (collagen), and 9.8 grams of carbohydrate (3 grams sugar); also contains 7 grams of L-arginine and L-glutamine, 300 mg vitamin C , 15 mg vitamin E, 1.2 mcg vitamin B-12, 9.5 mg zinc , 200 mg calcium , and 1.5 g  Calcium  Beta-hydroxy-Beta-methylbutyrate to support wound healing  -500 mg vitamin C  BID -220 mg zinc  sulfate daily x 14 days  -Continue MVI with minerals daily -Continue 1 mg folic acid  daily -Continue 100 mg thiamine  daily   NUTRITION DIAGNOSIS:   Increased nutrient needs related to chronic illness (COPD, CHF) as evidenced by estimated needs.  GOAL:   Patient will meet greater than or equal to 90% of their needs  MONITOR:   PO intake, Supplement acceptance  REASON FOR ASSESSMENT:   Consult Assessment of nutrition requirement/status  ASSESSMENT:   64 y.o. male, with medical history of hypertension, hyperlipidemia, chronic respiratory failure with hypoxia and hypercapnia secondary to COPD, CHF, with recent hospitalization for respiratory failure due to COPD, discharged to SNF facility. Patient was sent from [facility] due to shortness of breath and increased work of breathing  Patient admitted with acute on chronic respiratory failure, multifocal pneumonia, and COPD exacerbation.   Reviewed I/O's: -280 ml x 24 hours  UOP: 500 ml x 24 hours  Patient unavailable at time of visit. Attempted to speak with patient via call to hospital room phone, however, unable to reach. RD unable to obtain further nutrition-related history or complete nutrition-focused physical exam at this time.    Patient currently off Bi-pap (but to use at night and as needed during the day). He is currently on 4 L of oxygen.    Patient with history of CHF, but MD reports appearing euvolemic on exam.   Of note, patient with recent diagnosis of pancreatic mass; plan to follow-up with GI as an outpatient.   Patient is on a regular diet, consuming 90-100% of meals (1771 kcals and 76 grams protein thus far today).   Reviewed weight history. Noted weight has ranged from 54.4-58.7 kg over the past 11 months.   Patient is at high risk for malnutrition given multiple co-morbidities and pressure injury. He would benefit from addition of oral nutrition supplements.   Medications reviewed and include vitamin D3, farxiga , ferrous sulfate , folic acid , keppra , solu-medrol , MVI, rocephin , and thiamine .   Per TOC notes, patient was from [facility]. Patient family would like to try to discharge to a rehab closer to [place], [place].   Labs reviewed.    Diet Order:   Diet Order             Diet regular Room service appropriate? Yes; Fluid consistency: Thin  Diet effective now                   EDUCATION NEEDS:   No education needs have been identified at this time  Skin:  Skin Assessment: Skin Integrity Issues: Skin Integrity Issues:: Stage II Stage II: buttocks  Last BM:  06/06/24  Height:   Ht Readings from Last 1 Encounters:  06/07/24 5' 7 (1.702 m)    Weight:   Wt Readings from Last 1 Encounters:  06/07/24 56.5 kg    Ideal Body Weight:  67.3 kg  BMI:  Body mass  index is 19.51 kg/m.  Estimated Nutritional Needs:   Kcal:  1750-1950  Protein:  90-105 grams  Fluid:  1.7-1.9 L    Margery ORN, RD, LDN, CDCES Registered Dietitian III Certified Diabetes Care and Education Specialist If unable to reach this RD, please use RD Inpatient group chat on secure chat between hours of 8am-4 pm daily

## 2024-06-08 NOTE — Progress Notes (Signed)
 " PROGRESS NOTE   Darin Hopkins  FMW:[id number] DOB: [birth year] DOA: 06/07/2024 PCP: [name], NP   Chief Complaint  Patient presents with   Shortness of Breath   Level of care: Stepdown  Brief Admission History:  64 y.o. male, with medical history of hypertension, hyperlipidemia, chronic respiratory failure with hypoxia and hypercapnia secondary to COPD, CHF, with recent hospitalization for respiratory failure due to COPD, discharged to SNF facility.   Patient was sent by his facility due to shortness of breath and increased work of breathing, patient is poor historian, but to answer some questions, sister was obtained with assistance of ED staff as well, patient with known history of COPD, 3 L oxygen, reported by the facility he was hypoxic 60%, with increased work of breathing, received nebulizer treatment with improvement of his symptoms, upon presentation to ED he was saturating in the 80s, started on nonrebreather.  In ED patient with increased work of breathing, started on BiPAP, ABG significant for pCO2 of 58, pO2 of 63, creatinine at baseline 1.9, proBNP significantly elevated at 29,580, troponin is elevated at 123> 117 (lower than baseline, had leukocytosis at 12.2, negative COVID and influenza, CT chest without contrast significant for severe emphysema, and bibasilar consolidation, improved on BiPAP, [facility] consulted to admit   Assessment and Plan:  Acute on acute respiratory failure with hypoxemia and hypercapnia Multifocal pneumonia COPD exacerbation - Patient hypoxic, hypercapnic with increased work of breathing - CT chest significant for bibasilar pneumonia - continue IV Rocephin  and azithromycin , he was encouraged use incentive spirometry and flutter valve. - Significant wheezing on presentation as well, continue with IV steroids. - Continue scheduled DuoNebs and as needed albuterol . - Currently on 4 L oxygen, off BiPAP, will keep on BiPAP overnight and as  needed daytime   CKD 3B - Renal function at baseline   Chronic HFrEF - elevated proBNP, but he appears euvolemic on exam and imaging   Hypertension - Pressure mildly elevated, continue with home bisoprolol , will add as needed hydralazine  -- added amlodipine , hydralazine    Pancreatic mass -Diagnosis of pancreatic mass during recent imaging, per discharge recommendation to follow-up with GI as an outpatient   Failure to thrive  Protein calorie malnutrition  Deconditioning  - Will consult PT/OT   - Will consult nutritionist    Seizure disorder - Continue with Keppra  keppra    Hyperlipidemia  - Continue with statin   GERD -Continue with PPI   DVT prophylaxis: sq heparin  Code Status: Full  Family Communication:  Disposition:SNF   Consultants:   Procedures:   Antimicrobials:  CTX 1/29>> ZMax  1/29>>  Subjective: Pt not tolerating bipap well, now on room air oxygen  Objective: Vitals:   06/08/24 1000 06/08/24 1100 06/08/24 1147 06/08/24 1200  BP:    (!) 192/89  Pulse: 66 68  71  Resp: 12 17  15   Temp:   99 F (37.2 C)   TempSrc:   Oral   SpO2: 99% 99%  99%  Weight:      Height:        Intake/Output Summary (Last 24 hours) at 06/08/2024 1255 Last data filed at 06/08/2024 0623 Gross per 24 hour  Intake 220 ml  Output 500 ml  Net -280 ml   Filed Weights   06/07/24 1642  Weight: 56.5 kg   Examination:  General exam: Appears calm and comfortable  Respiratory system: Clear to auscultation. Respiratory effort normal. Cardiovascular system: normal S1 & S2 heard. No JVD, murmurs, rubs, gallops  or clicks. No pedal edema. Gastrointestinal system: Abdomen is nondistended, soft and nontender. No organomegaly or masses felt. Normal bowel sounds heard. Central nervous system: Alert and oriented. No focal neurological deficits. Extremities: Symmetric 5 x 5 power. Skin: No rashes, lesions or ulcers. Psychiatry: Judgement and insight appear normal. Mood & affect  appropriate.   Data Reviewed: I have personally reviewed following labs and imaging studies  CBC: Recent Labs  Lab 06/07/24 1641 06/08/24 0419  WBC 12.2* 6.1  NEUTROABS 7.3  --   HGB 10.2* 9.0*  HCT 33.8* 29.2*  MCV 95.5 93.0  PLT 154 146*    Basic Metabolic Panel: Recent Labs  Lab 06/07/24 1641 06/08/24 0419  NA 143 142  K 4.7 5.1  CL 103 104  CO2 28 28  GLUCOSE 99 118*  BUN 28* 29*  CREATININE 1.90* 1.84*  CALCIUM  9.4 9.3    CBG: No results for input(s): GLUCAP in the last 168 hours.  Recent Results (from the past 240 hours)  Resp panel by RT-PCR (RSV, Flu A&B, Covid) Anterior Nasal Swab     Status: None   Collection Time: 06/07/24  4:41 PM   Specimen: Anterior Nasal Swab  Result Value Ref Range Status   SARS Coronavirus 2 by RT PCR NEGATIVE NEGATIVE Final    Comment: (NOTE) SARS-CoV-2 target nucleic acids are NOT DETECTED.  The SARS-CoV-2 RNA is generally detectable in upper respiratory specimens during the acute phase of infection. The lowest concentration of SARS-CoV-2 viral copies this assay can detect is 138 copies/mL. A negative result does not preclude SARS-Cov-2 infection and should not be used as the sole basis for treatment or other patient management decisions. A negative result may occur with  improper specimen collection/handling, submission of specimen other than nasopharyngeal swab, presence of viral mutation(s) within the areas targeted by this assay, and inadequate number of viral copies(<138 copies/mL). A negative result must be combined with clinical observations, patient history, and epidemiological information. The expected result is Negative.  Fact Sheet for Patients:  bloggercourse.com  Fact Sheet for Healthcare Providers:  seriousbroker.it  This test is no t yet approved or cleared by the [place]  FDA and  has been authorized for detection and/or diagnosis of SARS-CoV-2  by FDA under an Emergency Use Authorization (EUA). This EUA will remain  in effect (meaning this test can be used) for the duration of the COVID-19 declaration under Section 564(b)(1) of the Act, 21 U.S.C.section 360bbb-3(b)(1), unless the authorization is terminated  or revoked sooner.       Influenza A by PCR NEGATIVE NEGATIVE Final   Influenza B by PCR NEGATIVE NEGATIVE Final    Comment: (NOTE) The Xpert Xpress SARS-CoV-2/FLU/RSV plus assay is intended as an aid in the diagnosis of influenza from Nasopharyngeal swab specimens and should not be used as a sole basis for treatment. Nasal washings and aspirates are unacceptable for Xpert Xpress SARS-CoV-2/FLU/RSV testing.  Fact Sheet for Patients: bloggercourse.com  Fact Sheet for Healthcare Providers: seriousbroker.it  This test is not yet approved or cleared by the [place]  FDA and has been authorized for detection and/or diagnosis of SARS-CoV-2 by FDA under an Emergency Use Authorization (EUA). This EUA will remain in effect (meaning this test can be used) for the duration of the COVID-19 declaration under Section 564(b)(1) of the Act, 21 U.S.C. section 360bbb-3(b)(1), unless the authorization is terminated or revoked.     Resp Syncytial Virus by PCR NEGATIVE NEGATIVE Final    Comment: (NOTE) Fact Sheet for  Patients: bloggercourse.com  Fact Sheet for Healthcare Providers: seriousbroker.it  This test is not yet approved or cleared by the [place]  FDA and has been authorized for detection and/or diagnosis of SARS-CoV-2 by FDA under an Emergency Use Authorization (EUA). This EUA will remain in effect (meaning this test can be used) for the duration of the COVID-19 declaration under Section 564(b)(1) of the Act, 21 U.S.C. section 360bbb-3(b)(1), unless the authorization is terminated or revoked.  Performed at  [facility], [address], [place], [place] [address]   MRSA Next Gen by PCR, Nasal     Status: None   Collection Time: 06/07/24  8:36 PM   Specimen: Nasal Mucosa; Nasal Swab  Result Value Ref Range Status   MRSA by PCR Next Gen NOT DETECTED NOT DETECTED Final    Comment: (NOTE) The GeneXpert MRSA Assay (FDA approved for NASAL specimens only), is one component of a comprehensive MRSA colonization surveillance program. It is not intended to diagnose MRSA infection nor to guide or monitor treatment for MRSA infections. Test performance is not FDA approved in patients less than 49 years old. Performed at [facility], [address], [place], [place] [address]      Radiology Studies: CT Chest Wo Contrast Result Date: 06/07/2024 EXAM: CT CHEST WITHOUT CONTRAST 06/07/2024 05:36:12 PM TECHNIQUE: CT of the chest was performed without the administration of intravenous contrast. Multiplanar reformatted images are provided for review. Automated exposure control, iterative reconstruction, and/or weight based adjustment of the mA/kV was utilized to reduce the radiation dose to as low as reasonably achievable. COMPARISON: Comparison with chest radiograph 06/07/2024 and CT chest 04/03/2024. CLINICAL HISTORY: sob Shortness of breath. Decreased oxygen saturation oxygen at baseline. FINDINGS: MEDIASTINUM: Cardiac enlargement. Small pericardial effusions. Calcification of the coronary arteries. Ascending thoracic aorta measuring 4.6 cm in diameter, unchanged. Calcification of the aorta. The central airways are clear. LYMPH NODES: No mediastinal, hilar or axillary lymphadenopathy. LUNGS AND PLEURA: Severe diffuse emphysematous changes throughout the lungs. Airspace consolidative changes in both lung bases, greater on the left. This could represent compressive atelectasis or pneumonia. Small left pleural effusion. No pneumothorax. SOFT TISSUES/BONES: No acute abnormality of the bones or soft tissues. UPPER ABDOMEN:  Limited images of the upper abdomen demonstrate a lesion in the lateral wall of the right kidney measuring 2.9 cm in diameter and containing calcification. This is unchanged since the prior study. On prior MRI, this was characterized as an indeterminate lesion and additional follow-up in 6 months is recommended. IMPRESSION: 1. Airspace consolidative changes in both lung bases, greater on the left, which may represent compressive atelectasis or pneumonia. 2. Small left pleural effusion. 3. Severe diffuse emphysematous changes throughout the lungs. Pulmonary emphysema is an independent risk factor for lung cancer. Recommend consideration for evaluation for a low-dose CT lung cancer screening program. 4. Cardiac enlargement and small pericardial effusions. 5. An indeterminate right renal lesion is again demonstrated without change in size. A 27-month follow-up is recommended . Electronically signed by: Elsie Gravely MD 06/07/2024 06:13 PM EST RP Workstation: [id number]   DG Chest 1 View Result Date: 06/07/2024 EXAM: 1 VIEW(S) XRAY OF THE CHEST 06/07/2024 04:54:00 PM COMPARISON: 05/21/2024 CLINICAL HISTORY: Shortness of breath. FINDINGS: LUNGS AND PLEURA: Hyperexpanded lungs with findings suggestive of emphysema. Redemonstrated small left pleural effusion with retrocardiac airspace opacities, which may represent atelectasis or aspiration. No pneumothorax. HEART AND MEDIASTINUM: Tortuous aorta with aortic atherosclerosis. No acute abnormality of the cardiac and mediastinal silhouettes. BONES AND SOFT TISSUES: Multilevel thoracic osteophytosis.  IMPRESSION: 1. Redemonstrated small left pleural effusion with retrocardiac airspace opacities, which may represent atelectasis, bronchopneumonia, or aspiration. 2. Hyperexpanded lungs with findings suggestive of emphysema. Pneumonia Electronically signed by: Rogelia Myers MD 06/07/2024 05:45 PM EST RP Workstation: [id number]   Scheduled Meds:  atorvastatin   20 mg Oral QPM    azithromycin   500 mg Oral Daily   bisoprolol   2.5 mg Oral QHS   Chlorhexidine  Gluconate Cloth  6 each Topical Q0600   cholecalciferol   1,000 Units Oral Daily   dapagliflozin  propanediol  10 mg Oral QAC breakfast   ferrous sulfate   325 mg Oral Q breakfast   folic acid   1 mg Oral Daily   heparin   5,000 Units Subcutaneous Q8H   ipratropium-albuterol   3 mL Nebulization TID   levETIRAcetam   750 mg Oral BID   methylPREDNISolone  (SOLU-MEDROL ) injection  125 mg Intravenous Daily   multivitamin with minerals  1 tablet Oral Daily   pantoprazole   40 mg Oral Daily   thiamine   100 mg Oral Daily   traZODone   50 mg Oral QHS   Continuous Infusions:  cefTRIAXone  (ROCEPHIN )  IV      LOS: 1 day   Time spent: 55 mins  Arsenia Goracke Vicci, MD How to contact the [facility] Attending or Consulting provider 7A - 7P or covering provider during after hours 7P -7A, for this patient?  Check the care team in [facility] and look for a) attending/consulting TRH provider listed and b) the TRH team listed Log into [email or web address] to find provider on call.  Locate the TRH provider you are looking for under [facility] and page to a number that you can be directly reached. If you still have difficulty reaching the provider, please page the [facility] (Director on Call) for the Hospitalists listed on amion for assistance.  06/08/2024, 12:55 PM    "

## 2024-06-08 NOTE — NC FL2 (Signed)
 " [place]  MEDICAID FL2 LEVEL OF CARE FORM     IDENTIFICATION  Patient Name: Darin Hopkins Birthdate: 08-06-1960 Sex: male Admission Date (Current Location): 06/07/2024  [facility] and [place] Number:  [employer] and Address:  [facility],  [address] [place] [address]      Provider Number:    Attending Physician Name and Address:  Vicci Afton LITTIE, MD  Relative Name and Phone Number:  SHLOMO RONAL LITTIE  [phone]    Current Level of Care: Hospital Recommended Level of Care: Skilled Nursing Facility Prior Approval Number:    Date Approved/Denied:   PASRR Number: [id number]  Discharge Plan: SNF    Current Diagnoses: Patient Active Problem List   Diagnosis Date Noted   Acute on chronic respiratory failure (HCC) 06/07/2024   Failure to thrive in adult 05/21/2024   Iron  deficiency anemia 04/17/2024   Weakness 04/14/2024   Chronic diastolic CHF (congestive heart failure) (HCC) 04/12/2024   Drop in hemoglobin 04/11/2024   Pancreatic mass 04/11/2024   Kidney lesion, native, right 04/11/2024   Hyperkalemia 04/11/2024   Hypernatremia 04/11/2024   Left inguinal hernia 04/03/2024   Chronic respiratory failure with hypoxia and hypercapnia (HCC) 01/16/2021   Acute hypoxemic respiratory failure (HCC) 11/25/2020   Acute kidney injury superimposed on CKD 11/25/2020   Status epilepticus (HCC) 09/09/2020   COPD (chronic obstructive pulmonary disease) (HCC) 08/27/2020   Protein-calorie malnutrition, severe 08/21/2020   Acute hypercapnic respiratory failure (HCC)    Acute decompensated heart failure (HCC) 08/18/2020   Acute exacerbation of CHF (congestive heart failure) (HCC) 08/17/2020   Essential hypertension 08/17/2020   History of seizure 08/17/2020   COPD  GOLD ? spirometry / 02 dep/ hypercarbic  08/17/2020   Hyperlipidemia 08/17/2020   GERD (gastroesophageal reflux disease) 08/17/2020   Alcohol abuse 08/17/2020   Chronic kidney disease,  stage III (moderate) (HCC) 10/24/2012    Orientation RESPIRATION BLADDER Height & Weight     Self, Time, Situation, Place  O2 (4L) Incontinent Weight: 124 lb 9 oz (56.5 kg) Height:  5' 7 (170.2 cm)  BEHAVIORAL SYMPTOMS/MOOD NEUROLOGICAL BOWEL NUTRITION STATUS      Incontinent Diet (Regular)  AMBULATORY STATUS COMMUNICATION OF NEEDS Skin   Extensive Assist Verbally Normal                       Personal Care Assistance Level of Assistance  Bathing, Feeding, Dressing Bathing Assistance: Maximum assistance Feeding assistance: Limited assistance Dressing Assistance: Maximum assistance Total Care Assistance: Maximum assistance   Functional Limitations Info  Sight, Hearing, Speech Sight Info: Adequate Hearing Info: Adequate Speech Info: Impaired    SPECIAL CARE FACTORS FREQUENCY  PT (By licensed PT), OT (By licensed OT)     PT Frequency: 5 times weekly OT Frequency: 5 times weekly            Contractures Contractures Info: Not present    Additional Factors Info  Code Status, Allergies Code Status Info: FULL Allergies Info: NKA           Current Medications (06/08/2024):  This is the current hospital active medication list Current Facility-Administered Medications  Medication Dose Route Frequency Provider Last Rate Last Admin   acetaminophen  (TYLENOL ) tablet 650 mg  650 mg Oral Q6H PRN Elgergawy, Dawood S, MD       albuterol  (PROVENTIL ) (2.5 MG/3ML) 0.083% nebulizer solution 2.5 mg  2.5 mg Nebulization Q2H PRN Elgergawy, Dawood S, MD  atorvastatin  (LIPITOR) tablet 20 mg  20 mg Oral QPM Elgergawy, Dawood S, MD   20 mg at 06/07/24 2144   azithromycin  (ZITHROMAX ) tablet 500 mg  500 mg Oral Daily Elgergawy, Dawood S, MD       bisoprolol  (ZEBETA ) tablet 2.5 mg  2.5 mg Oral QHS Elgergawy, Dawood S, MD   2.5 mg at 06/07/24 2143   cefTRIAXone  (ROCEPHIN ) 2 g in sodium chloride  0.9 % 100 mL IVPB  2 g Intravenous Q24H Elgergawy, Dawood S, MD       Chlorhexidine   Gluconate Cloth 2 % PADS 6 each  6 each Topical Q0600 Elgergawy, Brayton RAMAN, MD   6 each at 06/07/24 2127   cholecalciferol  (VITAMIN D3) 25 MCG (1000 UNIT) tablet 1,000 Units  1,000 Units Oral Daily Elgergawy, Dawood S, MD   1,000 Units at 06/08/24 0830   dapagliflozin  propanediol (FARXIGA ) tablet 10 mg  10 mg Oral QAC breakfast Elgergawy, Dawood S, MD   10 mg at 06/08/24 0831   ferrous sulfate  tablet 325 mg  325 mg Oral Q breakfast Elgergawy, Dawood S, MD   325 mg at 06/08/24 0831   folic acid  (FOLVITE ) tablet 1 mg  1 mg Oral Daily Elgergawy, Dawood S, MD   1 mg at 06/08/24 0831   heparin  injection 5,000 Units  5,000 Units Subcutaneous Q8H Elgergawy, Dawood S, MD   5,000 Units at 06/08/24 0636   hydrALAZINE  (APRESOLINE ) injection 10 mg  10 mg Intravenous Q4H PRN Johnson, Clanford L, MD       ipratropium-albuterol  (DUONEB) 0.5-2.5 (3) MG/3ML nebulizer solution 3 mL  3 mL Nebulization TID Elgergawy, Dawood S, MD   3 mL at 06/08/24 9142   levETIRAcetam  (KEPPRA ) tablet 750 mg  750 mg Oral BID Elgergawy, Dawood S, MD   750 mg at 06/08/24 0830   methylPREDNISolone  sodium succinate (SOLU-MEDROL ) 125 mg/2 mL injection 125 mg  125 mg Intravenous Daily Elgergawy, Dawood S, MD   125 mg at 06/08/24 9167   multivitamin with minerals tablet 1 tablet  1 tablet Oral Daily Elgergawy, Dawood S, MD   1 tablet at 06/08/24 0831   pantoprazole  (PROTONIX ) EC tablet 40 mg  40 mg Oral Daily Elgergawy, Dawood S, MD   40 mg at 06/08/24 0831   thiamine  (VITAMIN B1) tablet 100 mg  100 mg Oral Daily Johnson, Clanford L, MD   100 mg at 06/08/24 0831   traZODone  (DESYREL ) tablet 50 mg  50 mg Oral QHS Elgergawy, Dawood S, MD   50 mg at 06/07/24 2144     Discharge Medications: Please see discharge summary for a list of discharge medications.  Relevant Imaging Results:  Relevant Lab Results:   Additional Information SSN: [id number], [place]     "

## 2024-06-08 NOTE — Plan of Care (Signed)
" °  Problem: Acute Rehab PT Goals(only PT should resolve) Goal: Pt Will Go Supine/Side To Sit Outcome: Progressing Flowsheets (Taken 06/08/2024 1207) Pt will go Supine/Side to Sit:  with contact guard assist  with minimal assist Goal: Patient Will Transfer Sit To/From Stand Outcome: Progressing Flowsheets (Taken 06/08/2024 1207) Patient will transfer sit to/from stand:  with contact guard assist  with minimal assist Goal: Pt Will Transfer Bed To Chair/Chair To Bed Outcome: Progressing Flowsheets (Taken 06/08/2024 1207) Pt will Transfer Bed to Chair/Chair to Bed:  with contact guard assist  with min assist Goal: Pt Will Ambulate Outcome: Progressing Flowsheets (Taken 06/08/2024 1207) Pt will Ambulate:  25 feet  with minimal assist  with rolling walker  with contact guard assist   12:08 PM, 06/08/24 Lynwood Music, MPT Physical Therapist with [place] [phone] office [phone] mobile phone  "

## 2024-06-08 NOTE — TOC Initial Note (Signed)
 Transition of Care [facility]) - Initial/Assessment Note    Patient Details  Name: Darin Hopkins MRN: [id number] Date of Birth: 05/04/1961  Transition of Care [facility]) CM/SW Contact:    Lucie Lunger, LCSWA Phone Number: 06/08/2024, 11:34 AM  Clinical Narrative:                 CSW notes per chart review that pt arrived to hospital from [facility]. CSW spoke to [place] with the facility who confirms pt is a short term resident at this time and will need new SNF auth to return.   CSW updated by RN that family at bedside wanted to speak with CSW. CSW met at bedside with pts sister Ms. Turner. She is requesting SNF in  if possible. CSW explained that referral can be sent for review but there is no guarantee of finding a new facility, she is understanding of this and thankful for trying. CSW completed referral and sent out to all [place] facilities for review. CSW to follow up with pts sister with update when able. TOC to follow.   Expected Discharge Plan: Skilled Nursing Facility Barriers to Discharge: Continued Medical Work up   Patient Goals and CMS Choice Patient states their goals for this hospitalization and ongoing recovery are:: get stronger CMS Medicare.gov Compare Post Acute Care list provided to:: Patient Represenative (must comment) Choice offered to / list presented to : Sibling      Expected Discharge Plan and Services In-house Referral: Clinical Social Work Discharge Planning Services: CM Consult Post Acute Care Choice: Skilled Nursing Facility Living arrangements for the past 2 months: Skilled Nursing Facility                                      Prior Living Arrangements/Services Living arrangements for the past 2 months: Skilled Nursing Facility Lives with:: Facility Resident Patient language and need for interpreter reviewed:: Yes Do you feel safe going back to the place where you live?: Yes      Need for Family Participation in  Patient Care: Yes (Comment) Care giver support system in place?: Yes (comment)   Criminal Activity/Legal Involvement Pertinent to Current Situation/Hospitalization: No - Comment as needed  Activities of Daily Living   ADL Screening (condition at time of admission) Independently performs ADLs?: No Does the patient have a NEW difficulty with bathing/dressing/toileting/self-feeding that is expected to last >3 days?: No Does the patient have a NEW difficulty with getting in/out of bed, walking, or climbing stairs that is expected to last >3 days?: No Does the patient have a NEW difficulty with communication that is expected to last >3 days?: No Is the patient deaf or have difficulty hearing?: No Does the patient have difficulty seeing, even when wearing glasses/contacts?: No Does the patient have difficulty concentrating, remembering, or making decisions?: No  Permission Sought/Granted                  Emotional Assessment Appearance:: Appears stated age       Alcohol / Substance Use: Not Applicable Psych Involvement: No (comment)  Admission diagnosis:  COPD exacerbation (HCC) [J44.1] Acute on chronic respiratory failure (HCC) [J96.20] Pneumonia due to infectious organism, unspecified laterality, unspecified part of lung [J18.9] Acute hypoxic respiratory failure (HCC) [J96.01] Patient Active Problem List   Diagnosis Date Noted   Acute on chronic respiratory failure (HCC) 06/07/2024   Failure to thrive in adult  05/21/2024   Iron  deficiency anemia 04/17/2024   Weakness 04/14/2024   Chronic diastolic CHF (congestive heart failure) (HCC) 04/12/2024   Drop in hemoglobin 04/11/2024   Pancreatic mass 04/11/2024   Kidney lesion, native, right 04/11/2024   Hyperkalemia 04/11/2024   Hypernatremia 04/11/2024   Left inguinal hernia 04/03/2024   Chronic respiratory failure with hypoxia and hypercapnia (HCC) 01/16/2021   Acute hypoxemic respiratory failure (HCC) 11/25/2020   Acute  kidney injury superimposed on CKD 11/25/2020   Status epilepticus (HCC) 09/09/2020   COPD (chronic obstructive pulmonary disease) (HCC) 08/27/2020   Protein-calorie malnutrition, severe 08/21/2020   Acute hypercapnic respiratory failure (HCC)    Acute decompensated heart failure (HCC) 08/18/2020   Acute exacerbation of CHF (congestive heart failure) (HCC) 08/17/2020   Essential hypertension 08/17/2020   History of seizure 08/17/2020   COPD  GOLD ? spirometry / 02 dep/ hypercarbic  08/17/2020   Hyperlipidemia 08/17/2020   GERD (gastroesophageal reflux disease) 08/17/2020   Alcohol abuse 08/17/2020   Chronic kidney disease, stage III (moderate) (HCC) 10/24/2012   PCP:  [name], NP Pharmacy:   [facility] [place], [place] - [address] [place] [address] Phone: [phone] Fax: [phone]     Social Drivers of Health (SDOH) Social History: SDOH Screenings   Food Insecurity: No Food Insecurity (06/07/2024)  Housing: Low Risk (06/07/2024)  Transportation Needs: No Transportation Needs (06/07/2024)  Utilities: Not At Risk (06/07/2024)  Depression (PHQ2-9): Low Risk (02/11/2022)  Tobacco Use: Medium Risk (05/21/2024)   SDOH Interventions:     Readmission Risk Interventions    06/08/2024   11:33 AM  Readmission Risk Prevention Plan  Transportation Screening Complete  HRI or Home Care Consult Complete  Social Work Consult for Recovery Care Planning/Counseling Complete  Palliative Care Screening Not Applicable  Medication Review Oceanographer) Complete

## 2024-06-08 NOTE — Plan of Care (Signed)
" °  Problem: Acute Rehab OT Goals (only OT should resolve) Goal: Pt. Will Perform Grooming Flowsheets (Taken 06/08/2024 1058) Pt Will Perform Grooming:  with contact guard assist  standing Goal: Pt. Will Perform Upper Body Dressing Flowsheets (Taken 06/08/2024 1058) Pt Will Perform Upper Body Dressing: with modified independence Goal: Pt. Will Perform Lower Body Dressing Flowsheets (Taken 06/08/2024 1058) Pt Will Perform Lower Body Dressing:  with contact guard assist  with adaptive equipment  sitting/lateral leans Goal: Pt. Will Transfer To Toilet Flowsheets (Taken 06/08/2024 1058) Pt Will Transfer to Toilet:  with modified independence  ambulating  with supervision Goal: Pt. Will Perform Toileting-Clothing Manipulation Flowsheets (Taken 06/08/2024 1058) Pt Will Perform Toileting - Clothing Manipulation and hygiene:  with contact guard assist  sit to/from stand  sitting/lateral leans Goal: Pt/Caregiver Will Perform Home Exercise Program Flowsheets (Taken 06/08/2024 1058) Pt/caregiver will Perform Home Exercise Program:  Increased strength  Both right and left upper extremity  Independently  Lular Letson OT, MOT  "

## 2024-06-09 DIAGNOSIS — I10 Essential (primary) hypertension: Secondary | ICD-10-CM | POA: Diagnosis not present

## 2024-06-09 DIAGNOSIS — J449 Chronic obstructive pulmonary disease, unspecified: Secondary | ICD-10-CM | POA: Diagnosis not present

## 2024-06-09 DIAGNOSIS — J9621 Acute and chronic respiratory failure with hypoxia: Secondary | ICD-10-CM | POA: Diagnosis not present

## 2024-06-09 DIAGNOSIS — R627 Adult failure to thrive: Secondary | ICD-10-CM | POA: Diagnosis not present

## 2024-06-09 MED ORDER — IPRATROPIUM-ALBUTEROL 0.5-2.5 (3) MG/3ML IN SOLN
3.0000 mL | Freq: Four times a day (QID) | RESPIRATORY_TRACT | Status: DC | PRN
  Filled 2024-06-09: qty 3

## 2024-06-09 MED ORDER — HYDRALAZINE HCL 25 MG PO TABS
25.0000 mg | ORAL_TABLET | Freq: Three times a day (TID) | ORAL | Status: DC
  Administered 2024-06-09 – 2024-06-10 (×5): 25 mg via ORAL
  Filled 2024-06-09 (×7): qty 1

## 2024-06-09 MED ORDER — BISOPROLOL FUMARATE 5 MG PO TABS
5.0000 mg | ORAL_TABLET | Freq: Every day | ORAL | Status: DC
  Administered 2024-06-09: 5 mg via ORAL
  Filled 2024-06-09: qty 1

## 2024-06-09 MED ORDER — METHYLPREDNISOLONE SODIUM SUCC 40 MG IJ SOLR
40.0000 mg | Freq: Two times a day (BID) | INTRAMUSCULAR | Status: DC
  Administered 2024-06-09 – 2024-06-11 (×4): 40 mg via INTRAVENOUS
  Filled 2024-06-09 (×4): qty 1

## 2024-06-09 MED ORDER — HYDRALAZINE HCL 50 MG PO TABS: 50.0000 mg | ORAL_TABLET | Freq: Three times a day (TID) | ORAL | Status: DC

## 2024-06-09 MED ORDER — GUAIFENESIN ER 600 MG PO TB12
600.0000 mg | ORAL_TABLET | Freq: Two times a day (BID) | ORAL | Status: DC
  Administered 2024-06-09 – 2024-06-11 (×5): 600 mg via ORAL
  Filled 2024-06-09 (×4): qty 1

## 2024-06-09 MED ORDER — BUDESONIDE 0.25 MG/2ML IN SUSP
0.2500 mg | Freq: Two times a day (BID) | RESPIRATORY_TRACT | Status: DC
  Administered 2024-06-09 – 2024-06-11 (×5): 0.25 mg via RESPIRATORY_TRACT
  Filled 2024-06-09 (×5): qty 2

## 2024-06-09 MED ORDER — ARFORMOTEROL TARTRATE 15 MCG/2ML IN NEBU
15.0000 ug | INHALATION_SOLUTION | Freq: Two times a day (BID) | RESPIRATORY_TRACT | Status: DC
  Administered 2024-06-09 – 2024-06-11 (×5): 15 ug via RESPIRATORY_TRACT
  Filled 2024-06-09 (×5): qty 2

## 2024-06-09 NOTE — Plan of Care (Signed)

## 2024-06-10 DIAGNOSIS — Z87898 Personal history of other specified conditions: Secondary | ICD-10-CM | POA: Diagnosis not present

## 2024-06-10 DIAGNOSIS — E43 Unspecified severe protein-calorie malnutrition: Secondary | ICD-10-CM

## 2024-06-10 DIAGNOSIS — R627 Adult failure to thrive: Secondary | ICD-10-CM | POA: Diagnosis not present

## 2024-06-10 DIAGNOSIS — J9621 Acute and chronic respiratory failure with hypoxia: Secondary | ICD-10-CM | POA: Diagnosis not present

## 2024-06-10 DIAGNOSIS — J441 Chronic obstructive pulmonary disease with (acute) exacerbation: Secondary | ICD-10-CM

## 2024-06-10 LAB — BASIC METABOLIC PANEL WITH GFR
Anion gap: 8 (ref 5–15)
BUN: 38 mg/dL — ABNORMAL HIGH (ref 8–23)
CO2: 31 mmol/L (ref 22–32)
Calcium: 9.3 mg/dL (ref 8.9–10.3)
Chloride: 103 mmol/L (ref 98–111)
Creatinine, Ser: 1.96 mg/dL — ABNORMAL HIGH (ref 0.61–1.24)
GFR, Estimated: 38 mL/min — ABNORMAL LOW
Glucose, Bld: 106 mg/dL — ABNORMAL HIGH (ref 70–99)
Potassium: 5.6 mmol/L — ABNORMAL HIGH (ref 3.5–5.1)
Sodium: 142 mmol/L (ref 135–145)

## 2024-06-10 LAB — CBC
HCT: 33 % — ABNORMAL LOW (ref 39.0–52.0)
Hemoglobin: 9.8 g/dL — ABNORMAL LOW (ref 13.0–17.0)
MCH: 28.7 pg (ref 26.0–34.0)
MCHC: 29.7 g/dL — ABNORMAL LOW (ref 30.0–36.0)
MCV: 96.5 fL (ref 80.0–100.0)
Platelets: 162 10*3/uL (ref 150–400)
RBC: 3.42 MIL/uL — ABNORMAL LOW (ref 4.22–5.81)
RDW: 13.3 % (ref 11.5–15.5)
WBC: 9.3 10*3/uL (ref 4.0–10.5)
nRBC: 0 % (ref 0.0–0.2)

## 2024-06-10 MED ORDER — SODIUM CHLORIDE 3 % IN NEBU
INHALATION_SOLUTION | RESPIRATORY_TRACT | Status: AC
  Filled 2024-06-10: qty 4

## 2024-06-10 MED ORDER — BISOPROLOL FUMARATE 5 MG PO TABS
2.5000 mg | ORAL_TABLET | Freq: Every day | ORAL | Status: DC
  Administered 2024-06-10: 2.5 mg via ORAL
  Filled 2024-06-10: qty 1

## 2024-06-10 MED ORDER — SODIUM ZIRCONIUM CYCLOSILICATE 10 G PO PACK
10.0000 g | PACK | Freq: Three times a day (TID) | ORAL | Status: AC
  Administered 2024-06-10 (×2): 10 g via ORAL
  Filled 2024-06-10 (×2): qty 1

## 2024-06-10 MED ORDER — FUROSEMIDE 10 MG/ML IJ SOLN
30.0000 mg | Freq: Once | INTRAMUSCULAR | Status: AC
  Administered 2024-06-10: 30 mg via INTRAVENOUS
  Filled 2024-06-10: qty 4

## 2024-06-10 NOTE — Plan of Care (Signed)
   Problem: Education: Goal: Knowledge of General Education information will improve Description: Including pain rating scale, medication(s)/side effects and non-pharmacologic comfort measures Outcome: Progressing   Problem: Clinical Measurements: Goal: Ability to maintain clinical measurements within normal limits will improve Outcome: Progressing Goal: Respiratory complications will improve Outcome: Progressing Goal: Cardiovascular complication will be avoided Outcome: Progressing   Problem: Activity: Goal: Risk for activity intolerance will decrease Outcome: Progressing   Problem: Nutrition: Goal: Adequate nutrition will be maintained Outcome: Progressing

## 2024-06-10 NOTE — Plan of Care (Signed)

## 2024-06-10 NOTE — Progress Notes (Addendum)
 " PROGRESS NOTE   SHANA YOUNGE  FMW:[id number] DOB: 27-Jul-1960 DOA: 06/07/2024 PCP: [name], NP   Chief Complaint  Patient presents with   Shortness of Breath   Level of care: Med-Surg  Brief Admission History:  64 y.o. male, with medical history of hypertension, hyperlipidemia, chronic respiratory failure with hypoxia and hypercapnia secondary to COPD, CHF, with recent hospitalization for respiratory failure due to COPD, discharged to SNF facility.   Patient was sent by his facility due to shortness of breath and increased work of breathing, patient is poor historian, but to answer some questions, sister was obtained with assistance of ED staff as well, patient with known history of COPD, 3 L oxygen, reported by the facility he was hypoxic 60%, with increased work of breathing, received nebulizer treatment with improvement of his symptoms, upon presentation to ED he was saturating in the 80s, started on nonrebreather.  In ED patient with increased work of breathing, started on BiPAP, ABG significant for pCO2 of 58, pO2 of 63, creatinine at baseline 1.9, proBNP significantly elevated at 29,580, troponin is elevated at 123> 117 (lower than baseline, had leukocytosis at 12.2, negative COVID and influenza, CT chest without contrast significant for severe emphysema, and bibasilar consolidation, improved on BiPAP, [facility] hospitalist consulted to admit   Assessment and Plan:  Acute on acute respiratory failure with hypoxemia and hypercapnia Multifocal pneumonia COPD exacerbation - Patient hypoxic, hypercapnic with increased work of breathing - CT chest significant for bibasilar pneumonia - continue IV Rocephin  and azithromycin , he was encouraged use incentive spirometry and flutter valve. - Still with significant wheezing and tightness, continue with IV steroids. - Continue scheduled DuoNebs and as needed albuterol . - Currently on 3 L oxygen, off BiPAP, will keep on BiPAP nightly for now if  he tolerates - Pt says his baseline oxygen requirements range from 3-5 liters continuously  - budesonide  and brovana  nebs ordered   CKD 3B - Renal function at baseline  Hyperkalemia -- treated with IV furosemide  and lokelma  -- recheck BMP ordered   Chronic HFrEF - elevated proBNP, but he appears euvolemic on exam and imaging   Hypertension - BP remains elevated, continue with home bisoprolol , will add as needed hydralazine  -- added amlodipine , hydralazine , increased bisoprolol  to 5 mg but reduced back to 2.5 mg due to bradycardia   Pancreatic mass -- Diagnosis of pancreatic mass during recent imaging, per recent discharge recommendation to follow-up with GI as an outpatient   Failure to thrive  Protein calorie malnutrition  Deconditioning  - Will consult PT/OT   - Will consult nutritionist    Seizure disorder - Continue with Keppra    Hyperlipidemia  - Continue with statin   GERD -Continue with PPI   DVT prophylaxis: sq heparin  Code Status: Full  Family Communication:  Disposition: SNF   Consultants:   Procedures:   Antimicrobials:  CTX 1/29>> ZMax  1/29>>  Subjective: Much less SOB but coughing with dyspnea and less productive cough today. No CP or palpitations    Objective: Vitals:   06/10/24 0800 06/10/24 0818 06/10/24 0917 06/10/24 1000  BP: (!) 150/84  116/61 (!) 117/59  Pulse: 62   62  Resp: 15   14  Temp:      TempSrc:      SpO2: 100% 98%  99%  Weight:      Height:        Intake/Output Summary (Last 24 hours) at 06/10/2024 1112 Last data filed at 06/10/2024 0800 Gross per 24 hour  Intake 941.67 ml  Output 1350 ml  Net -408.33 ml   Filed Weights   06/07/24 1642  Weight: 56.5 kg   Examination:  General exam: cachectic, emaciated, and chronically ill appearing male, Appears calm and comfortable  Respiratory system: tight with poor air movement.  Cardiovascular system: normal S1 & S2 heard. No JVD, murmurs, rubs, gallops or clicks. No pedal  edema. Gastrointestinal system: Abdomen is nondistended, soft and nontender. No organomegaly or masses felt. Normal bowel sounds heard. Central nervous system: Alert and oriented. No focal neurological deficits. Extremities: Symmetric 5 x 5 power. Skin: No rashes, lesions or ulcers. Psychiatry: Judgement and insight appear normal. Mood & affect appropriate.   Data Reviewed: I have personally reviewed following labs and imaging studies  CBC: Recent Labs  Lab 06/07/24 1641 06/08/24 0419 06/10/24 0456  WBC 12.2* 6.1 9.3  NEUTROABS 7.3  --   --   HGB 10.2* 9.0* 9.8*  HCT 33.8* 29.2* 33.0*  MCV 95.5 93.0 96.5  PLT 154 146* 162    Basic Metabolic Panel: Recent Labs  Lab 06/07/24 1641 06/08/24 0419 06/10/24 0456  NA 143 142 142  K 4.7 5.1 5.6*  CL 103 104 103  CO2 28 28 31   GLUCOSE 99 118* 106*  BUN 28* 29* 38*  CREATININE 1.90* 1.84* 1.96*  CALCIUM  9.4 9.3 9.3    CBG: No results for input(s): GLUCAP in the last 168 hours.  Recent Results (from the past 240 hours)  Resp panel by RT-PCR (RSV, Flu A&B, Covid) Anterior Nasal Swab     Status: None   Collection Time: 06/07/24  4:41 PM   Specimen: Anterior Nasal Swab  Result Value Ref Range Status   SARS Coronavirus 2 by RT PCR NEGATIVE NEGATIVE Final    Comment: (NOTE) SARS-CoV-2 target nucleic acids are NOT DETECTED.  The SARS-CoV-2 RNA is generally detectable in upper respiratory specimens during the acute phase of infection. The lowest concentration of SARS-CoV-2 viral copies this assay can detect is 138 copies/mL. A negative result does not preclude SARS-Cov-2 infection and should not be used as the sole basis for treatment or other patient management decisions. A negative result may occur with  improper specimen collection/handling, submission of specimen other than nasopharyngeal swab, presence of viral mutation(s) within the areas targeted by this assay, and inadequate number of viral copies(<138 copies/mL). A  negative result must be combined with clinical observations, patient history, and epidemiological information. The expected result is Negative.  Fact Sheet for Patients:  bloggercourse.com  Fact Sheet for Healthcare Providers:  seriousbroker.it  This test is no t yet approved or cleared by the [place]  FDA and  has been authorized for detection and/or diagnosis of SARS-CoV-2 by FDA under an Emergency Use Authorization (EUA). This EUA will remain  in effect (meaning this test can be used) for the duration of the COVID-19 declaration under Section 564(b)(1) of the Act, 21 U.S.C.section 360bbb-3(b)(1), unless the authorization is terminated  or revoked sooner.       Influenza A by PCR NEGATIVE NEGATIVE Final   Influenza B by PCR NEGATIVE NEGATIVE Final    Comment: (NOTE) The Xpert Xpress SARS-CoV-2/FLU/RSV plus assay is intended as an aid in the diagnosis of influenza from Nasopharyngeal swab specimens and should not be used as a sole basis for treatment. Nasal washings and aspirates are unacceptable for Xpert Xpress SARS-CoV-2/FLU/RSV testing.  Fact Sheet for Patients: bloggercourse.com  Fact Sheet for Healthcare Providers: seriousbroker.it  This test is not yet approved  or cleared by the [place]  FDA and has been authorized for detection and/or diagnosis of SARS-CoV-2 by FDA under an Emergency Use Authorization (EUA). This EUA will remain in effect (meaning this test can be used) for the duration of the COVID-19 declaration under Section 564(b)(1) of the Act, 21 U.S.C. section 360bbb-3(b)(1), unless the authorization is terminated or revoked.     Resp Syncytial Virus by PCR NEGATIVE NEGATIVE Final    Comment: (NOTE) Fact Sheet for Patients: bloggercourse.com  Fact Sheet for Healthcare  Providers: seriousbroker.it  This test is not yet approved or cleared by the [place]  FDA and has been authorized for detection and/or diagnosis of SARS-CoV-2 by FDA under an Emergency Use Authorization (EUA). This EUA will remain in effect (meaning this test can be used) for the duration of the COVID-19 declaration under Section 564(b)(1) of the Act, 21 U.S.C. section 360bbb-3(b)(1), unless the authorization is terminated or revoked.  Performed at [facility], [address] [place], [place] [address]   MRSA Next Gen by PCR, Nasal     Status: None   Collection Time: 06/07/24  8:36 PM   Specimen: Nasal Mucosa; Nasal Swab  Result Value Ref Range Status   MRSA by PCR Next Gen NOT DETECTED NOT DETECTED Final    Comment: (NOTE) The GeneXpert MRSA Assay (FDA approved for NASAL specimens only), is one component of a comprehensive MRSA colonization surveillance program. It is not intended to diagnose MRSA infection nor to guide or monitor treatment for MRSA infections. Test performance is not FDA approved in patients less than 26 years old. Performed at [facility], [address] [place], [place] [address]      Radiology Studies: No results found.  Scheduled Meds:  amLODipine   10 mg Oral Daily   arformoterol   15 mcg Nebulization BID   ascorbic acid   500 mg Oral BID   atorvastatin   20 mg Oral QPM   azithromycin   500 mg Oral Daily   bisoprolol   2.5 mg Oral QHS   budesonide  (PULMICORT ) nebulizer solution  0.25 mg Nebulization BID   Chlorhexidine  Gluconate Cloth  6 each Topical Q0600   cholecalciferol   1,000 Units Oral Daily   dapagliflozin  propanediol  10 mg Oral QAC breakfast   ferrous sulfate   325 mg Oral Q breakfast   folic acid   1 mg Oral Daily   guaiFENesin   600 mg Oral BID   heparin   5,000 Units Subcutaneous Q8H   hydrALAZINE   25 mg Oral Q8H   levETIRAcetam   750 mg Oral BID   methylPREDNISolone  (SOLU-MEDROL ) injection  40 mg Intravenous  Q12H   multivitamin with minerals  1 tablet Oral Daily   nutrition supplement (JUVEN)  1 packet Oral BID BM   pantoprazole   40 mg Oral Daily   sodium zirconium cyclosilicate   10 g Oral TID   thiamine   100 mg Oral Daily   traZODone   50 mg Oral QHS   zinc  sulfate (50mg  elemental zinc )  220 mg Oral Daily   Continuous Infusions:  cefTRIAXone  (ROCEPHIN )  IV 2 g (06/09/24 1615)    LOS: 3 days   Time spent: 55 mins  Ruthetta Koopmann Vicci, MD How to contact the [facility] Attending or Consulting provider 7A - 7P or covering provider during after hours 7P -7A, for this patient?  Check the care team in [facility] and look for a) attending/consulting [facility] provider listed and b) the [facility] team listed Log into [email or web address] to find provider on call.  Locate the [facility] provider you  are looking for under [facility] and page to a number that you can be directly reached. If you still have difficulty reaching the provider, please page the [facility] (Director on Call) for the Hospitalists listed on amion for assistance.  06/10/2024, 11:12 AM    "

## 2024-06-11 ENCOUNTER — Ambulatory Visit: Admitting: Family

## 2024-06-11 DIAGNOSIS — E43 Unspecified severe protein-calorie malnutrition: Secondary | ICD-10-CM | POA: Diagnosis not present

## 2024-06-11 DIAGNOSIS — I5032 Chronic diastolic (congestive) heart failure: Secondary | ICD-10-CM | POA: Diagnosis not present

## 2024-06-11 DIAGNOSIS — J441 Chronic obstructive pulmonary disease with (acute) exacerbation: Secondary | ICD-10-CM | POA: Diagnosis not present

## 2024-06-11 DIAGNOSIS — J9621 Acute and chronic respiratory failure with hypoxia: Secondary | ICD-10-CM | POA: Diagnosis not present

## 2024-06-11 LAB — BASIC METABOLIC PANEL WITH GFR
Anion gap: 8 (ref 5–15)
BUN: 46 mg/dL — ABNORMAL HIGH (ref 8–23)
CO2: 32 mmol/L (ref 22–32)
Calcium: 8.8 mg/dL — ABNORMAL LOW (ref 8.9–10.3)
Chloride: 103 mmol/L (ref 98–111)
Creatinine, Ser: 2.09 mg/dL — ABNORMAL HIGH (ref 0.61–1.24)
GFR, Estimated: 35 mL/min — ABNORMAL LOW
Glucose, Bld: 114 mg/dL — ABNORMAL HIGH (ref 70–99)
Potassium: 5.3 mmol/L — ABNORMAL HIGH (ref 3.5–5.1)
Sodium: 143 mmol/L (ref 135–145)

## 2024-06-11 MED ORDER — PREDNISONE 20 MG PO TABS: 40.0000 mg | ORAL_TABLET | Freq: Every day | ORAL | Status: AC

## 2024-06-11 MED ORDER — HYDRALAZINE HCL 25 MG PO TABS: 25.0000 mg | ORAL_TABLET | Freq: Three times a day (TID) | ORAL | Status: AC

## 2024-06-11 MED ORDER — SODIUM ZIRCONIUM CYCLOSILICATE 10 G PO PACK: 10.0000 g | PACK | Freq: Two times a day (BID) | ORAL | Status: AC

## 2024-06-11 MED ORDER — IPRATROPIUM-ALBUTEROL 0.5-2.5 (3) MG/3ML IN SOLN: 3.0000 mL | Freq: Two times a day (BID) | RESPIRATORY_TRACT | Status: AC

## 2024-06-11 MED ORDER — IPRATROPIUM-ALBUTEROL 0.5-2.5 (3) MG/3ML IN SOLN: 3.0000 mL | Freq: Three times a day (TID) | RESPIRATORY_TRACT | Status: DC

## 2024-06-11 MED ORDER — DOXYCYCLINE HYCLATE 100 MG PO CAPS: 100.0000 mg | ORAL_CAPSULE | Freq: Two times a day (BID) | ORAL | Status: AC

## 2024-06-11 MED ORDER — ZINC SULFATE 220 (50 ZN) MG PO CAPS: 220.0000 mg | ORAL_CAPSULE | Freq: Every day | ORAL | Status: AC

## 2024-06-11 MED ORDER — GUAIFENESIN ER 600 MG PO TB12: 600.0000 mg | ORAL_TABLET | Freq: Two times a day (BID) | ORAL | 0 refills | Status: AC

## 2024-06-11 MED ORDER — SODIUM ZIRCONIUM CYCLOSILICATE 5 G PO PACK
5.0000 g | PACK | Freq: Once | ORAL | Status: AC
  Administered 2024-06-11: 5 g via ORAL
  Filled 2024-06-11: qty 1

## 2024-06-11 MED ORDER — AMLODIPINE BESYLATE 5 MG PO TABS: 5.0000 mg | ORAL_TABLET | Freq: Every day | ORAL | Status: AC

## 2024-06-11 MED ORDER — JUVEN PO PACK: 1.0000 | PACK | Freq: Two times a day (BID) | ORAL | Status: AC

## 2024-06-11 MED ORDER — SODIUM ZIRCONIUM CYCLOSILICATE 10 G PO PACK: 10.0000 g | PACK | Freq: Once | ORAL | Status: DC

## 2024-06-11 MED ORDER — SODIUM ZIRCONIUM CYCLOSILICATE 10 G PO PACK: 10.0000 g | PACK | Freq: Three times a day (TID) | ORAL | Status: DC

## 2024-06-11 MED ORDER — ASCORBIC ACID 500 MG PO TABS: 500.0000 mg | ORAL_TABLET | Freq: Every day | ORAL | Status: AC

## 2024-06-11 NOTE — Progress Notes (Signed)
 Physical Therapy Treatment Patient Details Name: Darin Hopkins MRN: [id number] DOB: [birth year] Today's Date: 06/11/2024   History of Present Illness Darin Hopkins  is a 65 y.o. male, with medical history of hypertension, hyperlipidemia, chronic respiratory failure with hypoxia and hypercapnia secondary to COPD, CHF, with recent hospitalization for respiratory failure due to COPD, discharged to SNF facility.    - Patient was sent by his facility due to shortness of breath and increased work of breathing, patient is poor historian, but to answer some questions, sister was obtained with assistance of ED staff as well, patient with known history of COPD, 3 L oxygen, reported by the facility he was hypoxic 60%, with increased work of breathing, received nebulizer treatment with improvement of his symptoms, upon presentation to ED he was saturating in the 80s, started on nonrebreather.    PT Comments  Pt friendly and willing to participate with therapy today.  Pt presents with slow labored movements with increased time with bed mobility and transfer training, SBA with only cueing required for safe mechanics to assist with standings and prior sitting on bed.  Pt limited with decreased O2 saturation following short duration of gait, decreased to 78% on 3L O2 A via nasal canal.  Increased to 4L O2 assistance and required for deep breathing with ability to achieve 88/90%.  EOS pt left in bed per request with call bell within reach and bed alarm set.      If plan is discharge home, recommend the following:     Can travel by private vehicle        Equipment Recommendations       Recommendations for Other Services       Precautions / Restrictions Precautions Precautions: Fall Recall of Precautions/Restrictions: Intact     Mobility  Bed Mobility Overal bed mobility: Modified Independent Bed Mobility: Sidelying to Sit     Supine to sit: Contact guard Sit to supine: Supervision   General bed  mobility comments: Labored movement.    Transfers Overall transfer level: Needs assistance Equipment used: Rolling walker (2 wheels) Transfers: Sit to/from Stand             General transfer comment: Cueing for hand placement to assist with sit to stand/ stand to sit on bed    Ambulation/Gait Ambulation/Gait assistance: Min assist, Mod assist Gait Distance (Feet): 15 Feet Assistive device: Rolling walker (2 wheels) Gait Pattern/deviations: Decreased step length - right, Decreased step length - left, Decreased stride length, Knees buckling Gait velocity: slow     General Gait Details: limited to ambulating at bedside due to weakness and occasional buckling of knees, mostly due to c/o difficulty breathing   Stairs             Wheelchair Mobility     Tilt Bed    Modified Rankin (Stroke Patients Only)       Balance                                            Communication Communication Communication: Impaired Factors Affecting Communication: Hearing impaired  Cognition Arousal: Alert Behavior During Therapy: WFL for tasks assessed/performed                             Following commands: Intact      Cueing Cueing Techniques: Verbal cues,  Tactile cues  Exercises      General Comments        Pertinent Vitals/Pain Pain Assessment Pain Assessment: No/denies pain    Home Living                          Prior Function            PT Goals (current goals can now be found in the care plan section)      Frequency           PT Plan      Co-evaluation              AM-PAC PT 6 Clicks Mobility   Outcome Measure  Help needed turning from your back to your side while in a flat bed without using bedrails?: None Help needed moving from lying on your back to sitting on the side of a flat bed without using bedrails?: A Little Help needed moving to and from a bed to a chair (including a  wheelchair)?: A Lot Help needed standing up from a chair using your arms (e.g., wheelchair or bedside chair)?: A Little Help needed to walk in hospital room?: A Lot Help needed climbing 3-5 steps with a railing? : A Lot 6 Click Score: 16    End of Session Equipment Utilized During Treatment: Gait belt Activity Tolerance: Patient tolerated treatment well;Patient limited by fatigue Patient left: in bed;with bed alarm set;with call bell/phone within reach   PT Visit Diagnosis: Other abnormalities of gait and mobility (R26.89);Muscle weakness (generalized) (M62.81);Unsteadiness on feet (R26.81)     Time: 1240-1305 PT Time Calculation (min) (ACUTE ONLY): 25 min  Charges:    $Therapeutic Activity: 23-37 mins                       [name], LPTA/CLT; CBIS [phone]   [name] Amble 06/11/2024, 1:22 PM

## 2024-06-11 NOTE — TOC Progression Note (Signed)
 Transition of Care [facility]) - Progression Note    Patient Details  Name: Darin Hopkins MRN: [id number] Date of Birth: 1961-01-18  Transition of [facility] CM/SW Contact  Lucie Lunger, [place] Phone Number: 06/11/2024, 9:52 AM  Clinical Narrative:    CSW notes no new bed offers for SNF at this time so pt will need to return to SNF at [facility]. CSW updated Donald in admissions and requested that aurh be started. CSW also requested that PT see pt again as shara will need a new note. CSW left secure VM for pts sister requesting call back when able to give update. TOC to follow.   Expected Discharge Plan: Skilled Nursing Facility Barriers to Discharge: Continued Medical Work up               Expected Discharge Plan and Services In-house Referral: Clinical Social Work Discharge Planning Services: CM Consult Post Acute Care Choice: Skilled Nursing Facility Living arrangements for the past 2 months: Skilled Nursing Facility                                       Social Drivers of Health (SDOH) Interventions SDOH Screenings   Food Insecurity: No Food Insecurity (06/07/2024)  Housing: Low Risk (06/07/2024)  Transportation Needs: No Transportation Needs (06/07/2024)  Utilities: Not At Risk (06/07/2024)  Depression (PHQ2-9): Low Risk (02/11/2022)  Tobacco Use: Medium Risk (05/21/2024)    Readmission Risk Interventions    06/08/2024   11:33 AM  Readmission Risk Prevention Plan  Transportation Screening Complete  HRI or Home Care Consult Complete  Social Work Consult for Recovery Care Planning/Counseling Complete  Palliative Care Screening Not Applicable  Medication Review Oceanographer) Complete

## 2024-06-27 ENCOUNTER — Ambulatory Visit: Admitting: Family

## 2024-06-29 ENCOUNTER — Ambulatory Visit: Admitting: Podiatry
